# Patient Record
Sex: Female | Born: 1940 | Race: Black or African American | Hispanic: No | Smoking: Former smoker
Health system: Southern US, Community
[De-identification: ages and names within clinical notes are randomized; demographics above are authoritative.]

## PROBLEM LIST (undated history)

## (undated) DIAGNOSIS — M1990 Unspecified osteoarthritis, unspecified site: Secondary | ICD-10-CM

## (undated) DIAGNOSIS — K5090 Crohn's disease, unspecified, without complications: Secondary | ICD-10-CM

## (undated) DIAGNOSIS — N179 Acute kidney failure, unspecified: Secondary | ICD-10-CM

## (undated) DIAGNOSIS — I509 Heart failure, unspecified: Secondary | ICD-10-CM

## (undated) DIAGNOSIS — J449 Chronic obstructive pulmonary disease, unspecified: Secondary | ICD-10-CM

## (undated) DIAGNOSIS — G4730 Sleep apnea, unspecified: Secondary | ICD-10-CM

## (undated) DIAGNOSIS — E119 Type 2 diabetes mellitus without complications: Secondary | ICD-10-CM

## (undated) DIAGNOSIS — G8929 Other chronic pain: Secondary | ICD-10-CM

## (undated) DIAGNOSIS — M25559 Pain in unspecified hip: Secondary | ICD-10-CM

## (undated) DIAGNOSIS — I639 Cerebral infarction, unspecified: Secondary | ICD-10-CM

## (undated) DIAGNOSIS — N189 Chronic kidney disease, unspecified: Secondary | ICD-10-CM

## (undated) DIAGNOSIS — I10 Essential (primary) hypertension: Secondary | ICD-10-CM

## (undated) DIAGNOSIS — K219 Gastro-esophageal reflux disease without esophagitis: Secondary | ICD-10-CM

## (undated) DIAGNOSIS — K222 Esophageal obstruction: Secondary | ICD-10-CM

## (undated) DIAGNOSIS — K279 Peptic ulcer, site unspecified, unspecified as acute or chronic, without hemorrhage or perforation: Secondary | ICD-10-CM

## (undated) DIAGNOSIS — D649 Anemia, unspecified: Secondary | ICD-10-CM

## (undated) DIAGNOSIS — M549 Dorsalgia, unspecified: Secondary | ICD-10-CM

## (undated) DIAGNOSIS — R569 Unspecified convulsions: Secondary | ICD-10-CM

## (undated) HISTORY — DX: Cerebral infarction, unspecified: I63.9

## (undated) HISTORY — PX: CHOLECYSTECTOMY: SHX55

## (undated) HISTORY — PX: BLADDER SURGERY: SHX569

## (undated) HISTORY — PX: ABDOMINAL HYSTERECTOMY: SHX81

## (undated) HISTORY — PX: SKIN CANCER EXCISION: SHX779

## (undated) HISTORY — DX: Anemia, unspecified: D64.9

## (undated) HISTORY — PX: TONSILLECTOMY: SUR1361

## (undated) HISTORY — DX: Unspecified convulsions: R56.9

---

## 2000-01-11 ENCOUNTER — Encounter: Payer: Self-pay | Admitting: Internal Medicine

## 2000-01-11 ENCOUNTER — Encounter (INDEPENDENT_AMBULATORY_CARE_PROVIDER_SITE_OTHER): Payer: Self-pay | Admitting: *Deleted

## 2004-12-22 ENCOUNTER — Emergency Department (HOSPITAL_COMMUNITY): Admission: EM | Admit: 2004-12-22 | Discharge: 2004-12-23 | Payer: Self-pay | Admitting: Emergency Medicine

## 2005-02-17 ENCOUNTER — Emergency Department (HOSPITAL_COMMUNITY): Admission: EM | Admit: 2005-02-17 | Discharge: 2005-02-17 | Payer: Self-pay | Admitting: Emergency Medicine

## 2006-10-10 ENCOUNTER — Encounter: Admission: RE | Admit: 2006-10-10 | Discharge: 2006-10-10 | Payer: Self-pay | Admitting: Internal Medicine

## 2006-10-10 ENCOUNTER — Encounter (INDEPENDENT_AMBULATORY_CARE_PROVIDER_SITE_OTHER): Payer: Self-pay | Admitting: *Deleted

## 2006-10-17 ENCOUNTER — Emergency Department (HOSPITAL_COMMUNITY): Admission: EM | Admit: 2006-10-17 | Discharge: 2006-10-18 | Payer: Self-pay | Admitting: Emergency Medicine

## 2006-11-21 ENCOUNTER — Ambulatory Visit: Payer: Self-pay | Admitting: Internal Medicine

## 2006-12-08 ENCOUNTER — Ambulatory Visit (HOSPITAL_COMMUNITY): Admission: RE | Admit: 2006-12-08 | Discharge: 2006-12-08 | Payer: Self-pay | Admitting: Internal Medicine

## 2006-12-08 ENCOUNTER — Encounter: Payer: Self-pay | Admitting: Internal Medicine

## 2006-12-13 ENCOUNTER — Ambulatory Visit: Payer: Self-pay | Admitting: Internal Medicine

## 2007-02-15 ENCOUNTER — Emergency Department (HOSPITAL_COMMUNITY): Admission: EM | Admit: 2007-02-15 | Discharge: 2007-02-15 | Payer: Self-pay | Admitting: Emergency Medicine

## 2007-06-28 DIAGNOSIS — K222 Esophageal obstruction: Secondary | ICD-10-CM | POA: Insufficient documentation

## 2007-06-28 DIAGNOSIS — M130 Polyarthritis, unspecified: Secondary | ICD-10-CM | POA: Insufficient documentation

## 2007-06-28 DIAGNOSIS — K449 Diaphragmatic hernia without obstruction or gangrene: Secondary | ICD-10-CM | POA: Insufficient documentation

## 2007-06-28 DIAGNOSIS — Z8711 Personal history of peptic ulcer disease: Secondary | ICD-10-CM | POA: Insufficient documentation

## 2007-06-28 DIAGNOSIS — T7840XA Allergy, unspecified, initial encounter: Secondary | ICD-10-CM | POA: Insufficient documentation

## 2007-06-28 DIAGNOSIS — E785 Hyperlipidemia, unspecified: Secondary | ICD-10-CM

## 2007-06-28 DIAGNOSIS — E119 Type 2 diabetes mellitus without complications: Secondary | ICD-10-CM | POA: Insufficient documentation

## 2007-06-28 DIAGNOSIS — R030 Elevated blood-pressure reading, without diagnosis of hypertension: Secondary | ICD-10-CM | POA: Insufficient documentation

## 2007-06-28 DIAGNOSIS — G589 Mononeuropathy, unspecified: Secondary | ICD-10-CM | POA: Insufficient documentation

## 2007-06-28 DIAGNOSIS — K5090 Crohn's disease, unspecified, without complications: Secondary | ICD-10-CM

## 2007-06-28 DIAGNOSIS — I509 Heart failure, unspecified: Secondary | ICD-10-CM | POA: Insufficient documentation

## 2008-02-12 ENCOUNTER — Ambulatory Visit: Payer: Self-pay | Admitting: Endocrinology

## 2008-02-15 ENCOUNTER — Encounter: Payer: Self-pay | Admitting: Endocrinology

## 2008-04-07 ENCOUNTER — Ambulatory Visit: Payer: Self-pay | Admitting: Diagnostic Radiology

## 2008-04-07 ENCOUNTER — Emergency Department (HOSPITAL_BASED_OUTPATIENT_CLINIC_OR_DEPARTMENT_OTHER): Admission: EM | Admit: 2008-04-07 | Discharge: 2008-04-07 | Payer: Self-pay | Admitting: Emergency Medicine

## 2008-11-14 ENCOUNTER — Ambulatory Visit: Payer: Self-pay | Admitting: Pulmonary Disease

## 2008-11-14 DIAGNOSIS — G4733 Obstructive sleep apnea (adult) (pediatric): Secondary | ICD-10-CM | POA: Insufficient documentation

## 2008-11-14 DIAGNOSIS — R0602 Shortness of breath: Secondary | ICD-10-CM

## 2008-11-17 ENCOUNTER — Telehealth: Payer: Self-pay | Admitting: Endocrinology

## 2008-11-22 ENCOUNTER — Emergency Department (HOSPITAL_BASED_OUTPATIENT_CLINIC_OR_DEPARTMENT_OTHER): Admission: EM | Admit: 2008-11-22 | Discharge: 2008-11-22 | Payer: Self-pay | Admitting: Emergency Medicine

## 2008-11-22 ENCOUNTER — Ambulatory Visit: Payer: Self-pay | Admitting: Diagnostic Radiology

## 2008-12-08 ENCOUNTER — Ambulatory Visit: Payer: Self-pay | Admitting: Pulmonary Disease

## 2009-10-15 ENCOUNTER — Inpatient Hospital Stay (HOSPITAL_COMMUNITY): Admission: EM | Admit: 2009-10-15 | Discharge: 2009-10-16 | Payer: Self-pay | Admitting: Internal Medicine

## 2009-10-15 ENCOUNTER — Ambulatory Visit: Payer: Self-pay | Admitting: Gastroenterology

## 2009-10-15 ENCOUNTER — Encounter: Payer: Self-pay | Admitting: Emergency Medicine

## 2009-10-15 ENCOUNTER — Ambulatory Visit: Payer: Self-pay | Admitting: Diagnostic Radiology

## 2009-10-15 ENCOUNTER — Encounter (INDEPENDENT_AMBULATORY_CARE_PROVIDER_SITE_OTHER): Payer: Self-pay | Admitting: *Deleted

## 2009-11-09 ENCOUNTER — Ambulatory Visit: Payer: Self-pay | Admitting: Internal Medicine

## 2009-11-24 ENCOUNTER — Ambulatory Visit (HOSPITAL_COMMUNITY): Admission: RE | Admit: 2009-11-24 | Discharge: 2009-11-24 | Payer: Self-pay | Admitting: Internal Medicine

## 2009-11-24 ENCOUNTER — Ambulatory Visit: Payer: Self-pay | Admitting: Internal Medicine

## 2009-11-25 ENCOUNTER — Encounter: Payer: Self-pay | Admitting: Internal Medicine

## 2010-04-27 NOTE — Letter (Signed)
Summary: Patient Notice- Polyp Results  [facility]  [address] [place], [place] [address]   Phone: [phone]  Fax: [phone]        November 25, 2009 MRN: [id number]    [facility] [address] [place], [place]  [address]    Dear Ms. Hayse,  I am pleased to inform you that the colon polyp(s) removed during your recent colonoscopy was (were) found to be benign (no cancer detected) upon pathologic examination.The polyps were hyperplastic ( not malignant). The random biopsies of the ileum and colon show low grade Crohn's disease  I recommend you have a repeat colonoscopy examination in 5x_ years to look for recurrent polyps, as having colon polyps increases your risk for having recurrent polyps or even colon cancer in the future.  Should you develop new or worsening symptoms of abdominal pain, bowel habit changes or bleeding from the rectum or bowels, please schedule an evaluation with either your primary care physician or with me.  Additional information/recommendations:  __ No further action with gastroenterology is needed at this time. Please      follow-up with your primary care physician for your other healthcare      needs.  _x_ Please call [phone] to schedule a return visit to review your      situation.  __ Please keep your follow-up visit as already scheduled.  _x_ Continue treatment plan as outlined the day of your exam.  Please call us if you are having persistent problems or have questions about your condition that have not been fully answered at this time.  Sincerely,  Lafayette Dragon MD  This letter has been electronically signed by your physician.  Appended Document: Patient Notice- Polyp Results letter mailed to patient's home

## 2010-04-27 NOTE — Letter (Signed)
Summary: Diabetic Instructions  [facility]  [place], [place] [address]   Phone: [phone]  Fax: [phone]    Michelle Farrell [birth year] MRN: [id number]   _ x _   ORAL DIABETIC MEDICATION INSTRUCTIONS                                                                Januvia, Actos The day before your procedure:   Take your diabetic pill as you do normally  The day of your procedure:   Do not take your diabetic pill    We will check your blood sugar levels during the admission process and again in Recovery before discharging you home  ________________________________________________________________________  _x  _   INSULIN (LONG ACTING) MEDICATION INSTRUCTIONS (Lantus,)   The day before your procedure:   Take  your regular evening dose    The day of your procedure:   Do not take your morning dose

## 2010-04-27 NOTE — Assessment & Plan Note (Signed)
Summary: POST HOSPITAL//CROHN'S FOLLOW-UP           Michelle Farrell   History of Present Illness Visit Type: Initial Visit Primary GI MD: Delfin Edis MD Primary Provider: Jana Hakim, MD Chief Complaint: Patient states that since she was discharged from the hospital she is doing good. She has increase her Asacol to three tabs three times a day.  History of Present Illness:   This is a 70 year old African American female who was hospitalized for Crohn's disease from 10/16/09-10/17/09. A CT scan of the abdomen showed an inflammatory process in the terminal ileum and cecum as well as an abdominal wall hernia which appeared to be stable. She has a diagnosis of Crohn's disease which was made in the 1990s at [facility] in  [place]. Her last office visit with [place]  was in August 2008. Other medical problems include COPD, type 2 diabetes, smoking, chronic renal insufficiency, history of esophageal stricture status post dilatation in 2003 and peptic ulcer disease. Her last colonoscopy in October 2001 by Dr. Melina Copa was a difficult exam due to redundant colon. The terminal ileum was not intubated. An upper endoscopy for dysphagia  in September 2008 showed no definite stricture. She was dilated with 15,16 and 17 mm dilators.   GI Review of Systems      Denies abdominal pain, acid reflux, belching, bloating, chest pain, dysphagia with liquids, dysphagia with solids, heartburn, loss of appetite, nausea, vomiting, vomiting blood, weight loss, and  weight gain.        Denies anal fissure, black tarry stools, change in bowel habit, constipation, diarrhea, diverticulosis, fecal incontinence, heme positive stool, hemorrhoids, irritable bowel syndrome, jaundice, light color stool, liver problems, rectal bleeding, and  rectal pain.    Current Medications (verified): 1)  Ventolin Hfa 108 (90 Base) Mcg/act Aers (Albuterol Sulfate) .... Use Prn 2)  Advair Diskus 250-50 Mcg/dose Misc (Fluticasone-Salmeterol) .... Use Prn 3)   Lantus 100 Unit/ml Soln (Insulin Glargine) .... 80 Units At Bedtime 4)  Tizanidine Hcl 4 Mg Tabs (Tizanidine Hcl) .... Take One By Mouth Three Times A Day 5)  Asacol 400 Mg Tbec (Mesalamine) .... Take 3 Tabs By Mouth Three Times A Day 6)  Gabapentin 600 Mg Tabs (Gabapentin) .... Talke 1 By Mouth Three Times A Day 7)  Omeprazole 20 Mg Cpdr (Omeprazole) .... Take 1 By Mouth Two Times A Day 8)  Simvastatin 40 Mg Tabs (Simvastatin) .... Take 1 At Bedtime 9)  Metoclopramide Hcl 10 Mg Tabs (Metoclopramide Hcl) .... Take 1 Four Times A Day 10)  Hydrocodone-Acetaminophen 5-500 Mg Tabs (Hydrocodone-Acetaminophen) .... Take 1 Three Times A Day 11)  Actos 30 Mg Tabs (Pioglitazone Hcl) .... Take 1 By Mouth Am 12)  Temazepam 15 Mg Caps (Temazepam) .... Take 1 At Bedtime 13)  Ranitidine Hcl 150 Mg Tabs (Ranitidine Hcl) .... Take 1 By Mouth Two Times A Day 14)  Dicyclomine Hcl 10 Mg Caps (Dicyclomine Hcl) .... Take One By Mouth Four Times A Day 15)  Klor-Con M20 20 Meq Cr-Tabs (Potassium Chloride Crys Cr) .... Take 1 By Mouth Qd 16)  Fish Oil 1000 Mg Caps (Omega-3 Fatty Acids) .... Take 1 By Mouth Qd 17)  Januvia 100 Mg Tabs (Sitagliptin Phosphate) .... Take 1 Tablet By Mouth Once A Day 18)  Vitamin C .... Take 1 Tablet By Mouth Once A Day 19)  Vitamin E .... Take 1 Tablet By Mouth Once A Day 20)  Prednisone 10 Mg Tabs (Prednisone) .... Take As Directed  Allergies (  verified): No Known Drug Allergies  Past History:  Past Medical History: Reviewed history from 11/14/2008 and no changes required.  HIATAL HERNIA (ICD-553.3) ESOPHAGEAL STRICTURE (ICD-530.3) OBSTRUCTIVE SLEEP APNEA (ICD-327.23) ALLERGY (ICD-995.3) POLYARTHRITIS (ICD-716.59) CHF (ICD-428.0) ELEVATED BLOOD PRESSURE (ICD-796.2) HYPERLIPIDEMIA (ICD-272.4) NEUROPATHY (ICD-355.9) DM (ICD-250.00) PUD, HX OF (ICD-V12.71) CROHN'S DISEASE (ICD-555.9)    Past Surgical History: Reviewed history from 06/28/2007 and no changes  required. Cholecystectomy Ventral hernia repair Hysterectomy  Family History: mother and 2 sibs have dm allergies: sister asthma: sister heart disease: mother, maternal grandfather, paternal grandmother cancer: maternal grandmother (ovarian, stomach)  Family History of Cirrhosis: Father Family History of Clotting disorder: Father esophageal varices Family History of Colon Cancer: maternal grandmother Family History of Esophageal Cancer: maternal uncle Family History of Kidney Disease: mother  Social History: Reviewed history from 11/03/2009 and no changes required. widowed pt has children. retired.  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 Patient is a current smoker. started at age 83.  1/2 ppd. Alcohol Use - no Illicit Drug Use - no  Review of Systems       The patient complains of arthritis/joint pain, change in vision, fatigue, heart rhythm changes, muscle pains/cramps, shortness of breath, sleeping problems, urination - excessive, and urination changes/pain.  The patient denies allergy/sinus, anemia, anxiety-new, back pain, blood in urine, breast changes/lumps, confusion, cough, coughing up blood, depression-new, fainting, fever, headaches-new, hearing problems, heart murmur, itching, menstrual pain, night sweats, nosebleeds, pregnancy symptoms, skin rash, sore throat, swelling of feet/legs, swollen lymph glands, thirst - excessive , urination - excessive , urine leakage, vision changes, and voice change.         Pertinent positive and negative review of systems were noted in the above HPI. All other ROS was otherwise negative.   Vital Signs:  Patient profile:   70 year old female Height:      67 inches Weight:      219.8 pounds BMI:     34.55 Pulse rate:   64 / minute Pulse rhythm:   regular BP sitting:   146 / 72  (left arm) Cuff size:   regular  Vitals Entered By: Bernita Buffy CMA Deborra Medina) (November 09, 2009 2:33 PM)  Physical Exam  General:  Well  developed, well nourished, no acute distress. Eyes:  PERRLA, no icterus. Mouth:  No deformity or lesions, dentition normal. Neck:  Supple; no masses or thyromegaly. Lungs:  Clear throughout to auscultation. Heart:  Regular rate and rhythm; no murmurs, rubs,  or bruits. Abdomen:  large protuberant abdomen with large panniculus. Mild tenderness on deep pressure in the right lower quadrant. Normal active bowel sounds. No mass. Rectal:  normal perianal area, normal rectal sphincter tone. Stool is Hemoccult negative. Skin:  Intact without significant lesions or rashes. Psych:  Alert and cooperative. Normal mood and affect.   Impression & Recommendations:  Problem # 1:  CROHN'S DISEASE (ICD-555.9) Patient has Crohn's disease of the terminal ileum and cecum as per recent CT scan of the abdomen. Patient is symptomatically improved on a prednisone taper from 20 mg to a current 5 mg. She will be finished later this week. She continues on Asacol 3.6 g daily. We will schedule her for a colonoscopy to assess the cecum, ileocecal valve and hopefully to intubate the terminal ileum to see if she is a candidate for an immunoooomodulator.  Orders: ZCOL (ZCOL)  Problem # 2:  ESOPHAGEAL STRICTURE (ICD-530.3) Patient has no symptoms at this time. She is to continue on omeprazole  20 mg 2 times a day.  Patient Instructions: 1)  continue Asacol 3.6 g daily. 2)  Schedule colonoscopy with MiraLax prep. 3)  Consider 6 MP 50 mg daily depending on the results of the colonoscopy. 4)  Copy sent to : Dr Jana Hakim 5)  The medication list was reviewed and reconciled.  All changed / newly prescribed medications were explained.  A complete medication list was provided to the patient / caregiver. Prescriptions: DULCOLAX 5 MG  TBEC (BISACODYL) Day before procedure take 2 at 3pm and 2 at 8pm.  #4 x 0   Entered by:   Madlyn Frankel CMA (AAMA)   Authorized by:   Lafayette Dragon MD   Signed by:   Madlyn Frankel CMA ([place]) on 11/09/2009   Method used:   Electronically to        [facility] [phone]* (retail)       [address]       [place], [place]  [address]       Ph: [phone]       Fax: [phone]   RxID:   [id number] REGLAN 10 MG  TABS (METOCLOPRAMIDE HCL) As per prep instructions.  #2 x 0   Entered by:   Madlyn Frankel CMA (AAMA)   Authorized by:   Lafayette Dragon MD   Signed by:   [place] (AAMA) on 11/09/2009   Method used:   Electronically to        [facility] [phone]* (retail)       [address]       [place], [place]  [address]       Ph: [phone]       Fax: [phone]   RxID:   [id number] [place]   POWD (POLYETHYLENE GLYCOL 3350) As per prep  instructions.  #255gm x 0   Entered by:   Madlyn Frankel CMA (AAMA)   Authorized by:   Lafayette Dragon MD   Signed by:   Madlyn Frankel CMA ([place]) on 11/09/2009   Method used:   Electronically to        [facility] [phone]* (retail)       [address]       [place], [place]  [address]       Ph: [phone]       Fax: [phone]   RxID:   [id number]

## 2010-04-27 NOTE — Letter (Signed)
Summary: COLON -Dr Melina Copa  COLON -Dr Melina Copa   Imported By: Madlyn Frankel CMA ([place]) 11/05/2009 11:53:47  _____________________________________________________________________  External Attachment:    Type:   Image     Comment:   External Document

## 2010-04-27 NOTE — Procedures (Signed)
Summary: Colonoscopy  Patient: Michelle Farrell Note: All result statuses are Final unless otherwise noted.  Tests: (1) Colonoscopy (COL)   COL Colonoscopy           [facility]     [place], [place]  [address]           COLONOSCOPY PROCEDURE REPORT           PATIENT:  [name], [name]  MR#:  [id number]     BIRTHDATE:  20-Jun-1940, 75 yrs. old  GENDER:  female     ENDOSCOPIST:  [name]. Olevia Perches, MD     REF. BY:  Jana Hakim, M.D.     PROCEDURE DATE:  11/24/2009     PROCEDURE:  Colonoscopy [address]23[address]     ASA CLASS:  Class II     INDICATIONS:  Crohn's disease since 1990, recent hospitalization     for flare-up, CT scan showed inflam changes in the cecum and TI,     pt now doing well, has completed Prednisone taper     MEDICATIONS:   Versed 10 mg, Fentanyl 100 mcg           DESCRIPTION OF PROCEDURE:   After the risks benefits and     alternatives of the procedure were thoroughly explained, informed     consent was obtained.  Digital rectal exam was performed and     revealed no rectal masses.   The  endoscope was introduced through     the anus and advanced to the terminal ileum which was intubated     for a short distance, without limitations.  The quality of the     prep was excellent, using MiraLax.  The instrument was then slowly     withdrawn as the colon was fully examined.     <<PROCEDUREIMAGES>>           FINDINGS:  There were multiple polyps identified and removed. in     the rectum. 5-6 tiny polyps in the rectum The polyps were removed     using cold biopsy forceps (see image1 and image2).  There were     multiple polyps identified and removed. throughout the colon. ?     pseudopolyps ?     The polyps were removed using cold biopsy forceps (see image3,     image4, and image5).  An ulcer was found in the cecum. With     standard forceps, biopsy was obtained and sent to pathology (see     image8, image7, and image6). linear ulceration, ?  Crohn's  The     terminal ileum appeared normal. With standard forceps, biopsy was     obtained and sent to pathology (see image10, image9, image11, and     image12).  Mild diverticulosis was found in the ascending colon     (see image13).  normal rectum (see image14).   Retroflexed views     in the rectum revealed no abnormalities.    The scope was then     withdrawn from the patient and the procedure completed.           COMPLICATIONS:  None     ENDOSCOPIC IMPRESSION:     1) Polyps, multiple in the rectum     2) Polyps, multiple throughout the colon     3) Ulcer in the cecum     4) Normal terminal ileum     5) Mild  diverticulosis in the ascending colon     6) Normal rectum     multiple small polyps and pseudopolyps, no evidence of Crohn's     disease in the TI, the only activiti noted in the cecum at the     site of a linear ulceration, ileocecal valve appears normal ( not     deformed)     RECOMMENDATIONS:     1) Await biopsy results     Asacal 9/day, would hold off on starting Imuran since disease is     minimal.     Follow up OV 2 months     REPEAT EXAM:  In 5 year(s) for.           ______________________________     [name]. Olevia Perches, MD           CC:           n.     eSIGNED:   [name]. Brodie at 11/24/2009 10:50 AM           [name], [phone]  Note: An exclamation mark (!) indicates a result that was not dispersed into the flowsheet. Document Creation Date: 11/24/2009 10:52 AM _______________________________________________________________________  (1) Order result status: Final Collection or observation date-time: 11/24/2009 10:39 Requested date-time:  Receipt date-time:  Reported date-time:  Referring Physician:   Ordering Physician: Delfin Edis [phone]) Specimen Source:  Source: Tawanna Cooler Order Number: [id number] Lab site:

## 2010-04-27 NOTE — Procedures (Signed)
Summary: Instructions for procedure/[place]  Instructions for procedure/[place]   Imported By: Phillis Knack 11/20/2009 11:04:28  _____________________________________________________________________  External Attachment:    Type:   Image     Comment:   External Document

## 2010-04-27 NOTE — Letter (Signed)
Summary: [facility] Instructions  [facility]  [place], [place] [address]   Phone: [phone]  Fax: [phone]       Michelle Farrell    1941-01-20    MRN: [id number]       Procedure Day /Date: 11/24/09 Tuesday     Arrival Time: 8:30 am     Procedure Time: 9:30 am     Location of Procedure:                    _ x_  [facility] ( Outpatient Registration)   [place]  Starting 5 days prior to your procedure (11/19/09) do not eat nuts, seeds, popcorn, corn, beans, peas,  salads, or any raw vegetables.  Do not take any fiber supplements (e.g. Metamucil, Citrucel, and Benefiber). ____________________________________________________________________________________________________   THE DAY BEFORE YOUR PROCEDURE         DATE: 11/23/09 DAY: Monday  1   Drink clear liquids the entire day-NO SOLID FOOD  2   Do not drink anything colored red or purple.  Avoid juices with pulp.  No orange juice.  3   Drink at least 64 oz. (8 glasses) of fluid/clear liquids during the day to prevent dehydration and help the prep work efficiently.  CLEAR LIQUIDS INCLUDE: Water Jello Ice Popsicles Tea (sugar ok, no milk/cream) Powdered fruit flavored drinks Coffee (sugar ok, no milk/cream) Gatorade Juice: apple, white grape, white cranberry  Lemonade Clear bullion, consomm, broth Carbonated beverages (any kind) Strained chicken noodle soup Hard Candy  4   Mix the entire bottle of Miralax with 64 oz. of Gatorade/Powerade in the morning and put in the refrigerator to chill.  5   At 3:00 pm take 2 Dulcolax/Bisacodyl tablets.  6   At 4:30 pm take one Reglan/Metoclopramide tablet.  7  Starting at 5:00 pm drink one 8 oz glass of the Miralax mixture every 15-20 minutes until you have finished drinking the entire 64 oz.  You should finish drinking prep around 7:30 or 8:00 pm.  8   If you are nauseated, you may take the 2nd  Reglan/Metoclopramide tablet at 6:30 pm.        9    At 8:00 pm take 2 more DULCOLAX/Bisacodyl tablets.        THE DAY OF YOUR PROCEDURE      DATE:  11/24/09 DAY: Wednesday  You may drink clear liquids until 5:30 am  (4 HOURS BEFORE PROCEDURE).   MEDICATION INSTRUCTIONS  Unless otherwise instructed, you should take regular prescription medications with a small sip of water as early as possible the morning of your procedure.  Diabetic patients - see separate instructions.       OTHER INSTRUCTIONS  You will need a responsible adult at least 68 years of age to accompany you and drive you home.   This person must remain in the waiting room during your procedure.  Wear loose fitting clothing that is easily removed.  Leave jewelry and other valuables at home.  However, you may wish to bring a book to read or an iPod/MP3 player to listen to music as you wait for your procedure to start.  Remove all body piercing jewelry and leave at home.  Total time from sign-in until discharge is approximately 2-3 hours.  You should go home directly after your procedure and rest.  You can resume normal activities the day after your procedure.  The day of your procedure you  should not:   Drive   Make legal decisions   Operate machinery   Drink alcohol   Return to work  You will receive specific instructions about eating, activities and medications before you leave.   The above instructions have been reviewed and explained to me by   _______________________    I fully understand and can verbalize these instructions _____________________________ Date _______

## 2010-06-11 LAB — GLUCOSE, CAPILLARY: Glucose-Capillary: 74 mg/dL (ref 70–99)

## 2010-06-12 LAB — CULTURE, BLOOD (ROUTINE X 2)
Culture: NO GROWTH
Culture: NO GROWTH

## 2010-06-12 LAB — DIFFERENTIAL
Basophils Absolute: 0.1 10*3/uL (ref 0.0–0.1)
Basophils Relative: 1 % (ref 0–1)
Eosinophils Absolute: 0.2 10*3/uL (ref 0.0–0.7)
Eosinophils Relative: 2 % (ref 0–5)
Lymphocytes Relative: 33 % (ref 12–46)
Lymphs Abs: 4.7 10*3/uL — ABNORMAL HIGH (ref 0.7–4.0)
Monocytes Absolute: 1.4 10*3/uL — ABNORMAL HIGH (ref 0.1–1.0)
Monocytes Relative: 10 % (ref 3–12)
Neutro Abs: 7.8 10*3/uL — ABNORMAL HIGH (ref 1.7–7.7)
Neutrophils Relative %: 55 % (ref 43–77)

## 2010-06-12 LAB — URINALYSIS, ROUTINE W REFLEX MICROSCOPIC
Bilirubin Urine: NEGATIVE
Glucose, UA: NEGATIVE mg/dL
Hgb urine dipstick: NEGATIVE
Ketones, ur: NEGATIVE mg/dL
Nitrite: NEGATIVE
Protein, ur: NEGATIVE mg/dL
Specific Gravity, Urine: 1.013 (ref 1.005–1.030)
Urobilinogen, UA: 1 mg/dL (ref 0.0–1.0)
pH: 5.5 (ref 5.0–8.0)

## 2010-06-12 LAB — COMPREHENSIVE METABOLIC PANEL
ALT: 9 U/L (ref 0–35)
ALT: 13 U/L (ref 0–35)
AST: 17 U/L (ref 0–37)
AST: 18 U/L (ref 0–37)
Albumin: 3.7 g/dL (ref 3.5–5.2)
Albumin: 3.5 g/dL (ref 3.5–5.2)
Alkaline Phosphatase: 91 U/L (ref 39–117)
BUN: 27 mg/dL — ABNORMAL HIGH (ref 6–23)
CO2: 20 mEq/L (ref 19–32)
CO2: 20 mEq/L (ref 19–32)
Calcium: 9.6 mg/dL (ref 8.4–10.5)
Calcium: 9.4 mg/dL (ref 8.4–10.5)
Chloride: 107 mEq/L (ref 96–112)
Creatinine, Ser: 3 mg/dL — ABNORMAL HIGH (ref 0.4–1.2)
Creatinine, Ser: 1.98 mg/dL — ABNORMAL HIGH (ref 0.4–1.2)
GFR calc Af Amer: 19 mL/min — ABNORMAL LOW (ref >60)
GFR calc Af Amer: 30 mL/min — ABNORMAL LOW (ref >60)
GFR calc non Af Amer: 16 mL/min — ABNORMAL LOW (ref >60)
GFR calc non Af Amer: 25 mL/min — ABNORMAL LOW (ref >60)
Glucose, Bld: 105 mg/dL — ABNORMAL HIGH (ref 70–99)
Potassium: 4.3 mEq/L (ref 3.5–5.1)
Sodium: 140 mEq/L (ref 135–145)
Sodium: 135 mEq/L (ref 135–145)
Total Bilirubin: 0.5 mg/dL (ref 0.3–1.2)
Total Protein: 6.9 g/dL (ref 6.0–8.3)
Total Protein: 7.2 g/dL (ref 6.0–8.3)

## 2010-06-12 LAB — IRON AND TIBC
Saturation Ratios: 8 % — ABNORMAL LOW (ref 20–55)
UIBC: 260 ug/dL

## 2010-06-12 LAB — CBC
HCT: 31.6 % — ABNORMAL LOW (ref 36.0–46.0)
Hemoglobin: 10.7 g/dL — ABNORMAL LOW (ref 12.0–15.0)
Hemoglobin: 11.3 g/dL — ABNORMAL LOW (ref 12.0–15.0)
MCH: 30.6 pg (ref 26.0–34.0)
MCH: 30.1 pg (ref 26.0–34.0)
MCHC: 33.9 g/dL (ref 30.0–36.0)
MCHC: 33.1 g/dL (ref 30.0–36.0)
MCV: 90.2 fL (ref 78.0–100.0)
Platelets: 169 10*3/uL (ref 150–400)
Platelets: 161 10*3/uL (ref 150–400)
RBC: 3.51 MIL/uL — ABNORMAL LOW (ref 3.87–5.11)
RDW: 14.9 % (ref 11.5–15.5)
WBC: 14.2 10*3/uL — ABNORMAL HIGH (ref 4.0–10.5)

## 2010-06-12 LAB — BASIC METABOLIC PANEL
CO2: 21 mEq/L (ref 19–32)
Calcium: 9.7 mg/dL (ref 8.4–10.5)
GFR calc Af Amer: 41 mL/min — ABNORMAL LOW (ref >60)
Glucose, Bld: 137 mg/dL — ABNORMAL HIGH (ref 70–99)
Potassium: 3.9 mEq/L (ref 3.5–5.1)
Sodium: 143 mEq/L (ref 135–145)

## 2010-06-12 LAB — GLUCOSE, CAPILLARY
Glucose-Capillary: 104 mg/dL — ABNORMAL HIGH (ref 70–99)
Glucose-Capillary: 100 mg/dL — ABNORMAL HIGH (ref 70–99)
Glucose-Capillary: 138 mg/dL — ABNORMAL HIGH (ref 70–99)
Glucose-Capillary: 179 mg/dL — ABNORMAL HIGH (ref 70–99)
Glucose-Capillary: 221 mg/dL — ABNORMAL HIGH (ref 70–99)
Glucose-Capillary: 242 mg/dL — ABNORMAL HIGH (ref 70–99)

## 2010-06-12 LAB — FERRITIN: Ferritin: 81 ng/mL (ref 10–291)

## 2010-06-12 LAB — URINE MICROSCOPIC-ADD ON

## 2010-06-12 LAB — RETICULOCYTES
RBC.: 3.88 MIL/uL (ref 3.87–5.11)
Retic Ct Pct: 1.6 % (ref 0.4–3.1)

## 2010-06-12 LAB — URINE CULTURE: Colony Count: 8000

## 2010-06-12 LAB — FOLATE: Folate: 11.4 ng/mL

## 2010-06-12 LAB — MRSA PCR SCREENING: MRSA by PCR: NEGATIVE

## 2010-06-12 LAB — VITAMIN B12: Vitamin B-12: 374 pg/mL (ref 211–911)

## 2010-06-12 LAB — CARDIAC PANEL(CRET KIN+CKTOT+MB+TROPI)
CK, MB: 5.1 ng/mL — ABNORMAL HIGH (ref 0.3–4.0)
Relative Index: 2.8 — ABNORMAL HIGH (ref 0.0–2.5)
Total CK: 184 U/L — ABNORMAL HIGH (ref 7–177)

## 2010-06-12 LAB — CREATININE, URINE, RANDOM: Creatinine, Urine: 20.2 mg/dL

## 2010-06-12 LAB — CORTISOL: Cortisol, Plasma: 34.2 ug/dL

## 2010-06-12 LAB — LACTIC ACID, PLASMA: Lactic Acid, Venous: 1.5 mmol/L (ref 0.5–2.2)

## 2010-06-12 LAB — SODIUM, URINE, RANDOM: Sodium, Ur: 16 mEq/L

## 2010-07-12 LAB — URINALYSIS, ROUTINE W REFLEX MICROSCOPIC
Bilirubin Urine: NEGATIVE
Glucose, UA: NEGATIVE mg/dL
Hgb urine dipstick: NEGATIVE
Ketones, ur: NEGATIVE mg/dL
Protein, ur: 30 mg/dL — AB

## 2010-07-12 LAB — COMPREHENSIVE METABOLIC PANEL
ALT: 17 U/L (ref 0–35)
AST: 26 U/L (ref 0–37)
Alkaline Phosphatase: 110 U/L (ref 39–117)
Calcium: 9.8 mg/dL (ref 8.4–10.5)
GFR calc Af Amer: 32 mL/min — ABNORMAL LOW (ref >60)
Potassium: 4.4 mEq/L (ref 3.5–5.1)
Sodium: 137 mEq/L (ref 135–145)
Total Protein: 7.7 g/dL (ref 6.0–8.3)

## 2010-07-12 LAB — POCT CARDIAC MARKERS
CKMB, poc: 1.7 ng/mL (ref 1.0–8.0)
Myoglobin, poc: 191 ng/mL (ref 12–200)
Troponin i, poc: <0.05 ng/mL (ref 0.00–0.09)

## 2010-07-12 LAB — CBC
MCHC: 33.5 g/dL (ref 30.0–36.0)
RBC: 3.76 MIL/uL — ABNORMAL LOW (ref 3.87–5.11)
RDW: 14.7 % (ref 11.5–15.5)

## 2010-07-12 LAB — DIFFERENTIAL
Eosinophils Absolute: 0.9 10*3/uL — ABNORMAL HIGH (ref 0.0–0.7)
Eosinophils Relative: 12 % — ABNORMAL HIGH (ref 0–5)
Lymphs Abs: 3.2 10*3/uL (ref 0.7–4.0)
Monocytes Absolute: 1.1 10*3/uL — ABNORMAL HIGH (ref 0.1–1.0)
Monocytes Relative: 14 % — ABNORMAL HIGH (ref 3–12)

## 2010-07-12 LAB — URINE MICROSCOPIC-ADD ON

## 2010-07-12 LAB — POCT B-TYPE NATRIURETIC PEPTIDE (BNP): B Natriuretic Peptide, POC: 11.6 pg/mL (ref 0–100)

## 2010-08-10 NOTE — Assessment & Plan Note (Signed)
[name] OFFICE NOTE   Michelle Farrell, FEBO                       MRN:          [id number]  DATE:11/21/2006                            DOB:          January 27, 1941    REASON FOR CONSULTATION:  [name] is a very nice 70 year old  African/American female, a patient of Dr. Jana Hakim' who is here  today for an evaluation of several GI issues.  One is solid food  dysphagia.  For the past several months she has noticed food, especially  meat, sticking and hesitating.  There is no odynophagia.  She denies any  dysphagia to liquids.  There is a history of what sounds like benign  distal esophageal stricture which was dilated on two previous occasions  last time in [place] by Dr. Claudie Leach about five years ago.  She has been on  an anti-reflux regimen, initially Nexium 40 mg daily.  The Nexium was  discontinued because of no reimbursement caused by Medicaid and she was  switched to Prilosec 20 mg daily.  She has to take at least two a day  plus ranitidine, but still has reflux symptoms.  She denies any  nocturnal cough, hoarseness or choking.   Another problem has been diagnosed of Crohn's disease.  This was made in  [place] in the 1990's.  We do not have any of the records, but it  sounds like the patient had an upper GI, a CT scan, a colonoscopy and  multiple other studies.  She was put on prednisone .  She at that time  presented with diarrhea and abdominal pain.  There was never any  bleeding.  She currently denies any abdominal pain pertaining to her  Crohn's disease, but has had incontinent diarrhea at times.  Her weight  has been fluctuating.  She lost about 20 pounds several months ago, but  has regained it back.  Her last colonoscopy was done again by Dr. Claudie Leach  in  [place] about five years ago.  We have requested those records.   The third problem has been a history of peptic ulcer disease.  It  relates to her taking NSAIDs for her poly-arthritis many years ago.  She  developed an ulcer, seen on endoscopic examination.  She since then has  not taken NSAIDs.  She has arthralgias and receives steroid injections  in her joints.  She controls the arthralgias with hydrocodone 5/500 mg.   MEDICATIONS:  1. Potassium 20 mEq daily.  2. Dicyclomine 10 mg p.o. four times daily.  3. Cozaar 50 mg p.o. daily.  4. Asacol 400 mg, three p.o. twice daily.  5. Metoclopramide 10 mg four times daily.  6. Tizanidine hydrochloride 4 mg p.o. three times daily.  7. Spironolactone 25 mg p.o. daily.  8. __________  180 mg p.o. daily.  9. Omeprazole 200 mg p.o. twice daily.  10.Ranitidine 150 mg p.o. twice daily.  11.Temazepam 15 mg q.h.s.  12.Simvastatin 40 mg p.o. daily.  13.Furosemide 20 mg p.o. daily.  14.Gabapentin 600 mg p.o. three times daily.  15.Lantus insulin 75 units daily.  16.Fish  oil and B12 supplements.   PAST MEDICAL HISTORY:  1. Significant for diabetes, insulin-dependent.  2. Neuropathy.  3. Hyperlipidemia.  4. High blood pressure.  5. Heart failure.  Dr. Eden Lathe. Einar Gip is her cardiologist.  6. Poly-arthritis.  7. Allergies.  8. Sleep apnea.   PAST SURGICAL HISTORY:  1. Cholecystectomy.  2. Ventral hernia repair.  3. Hysterectomy.   FAMILY HISTORY:  Positive for alcoholism and diabetes.   SOCIAL HISTORY:  She lives with her granddaughter.  She is widowed.  She  has one child.  She smokes.  Does not drink alcohol.   REVIEW OF SYSTEMS:  Positive for eye glasses, allergies, urination  changes, arthritic complaints,  sleeping problems, excessive thirst,  vision changes, back pain, excessive urination, severe fatigue, hearing  problems, shortness of breath and  muscle pains.   PHYSICAL EXAMINATION:  VITAL SIGNS:  Blood pressure 100/60, pulse 68,  weight 181 pounds.  GENERAL:  She is alert and oriented, in no distress.  Her voice was  normal.  HEENT:  Sclerae anicteric.   NECK:  Supple, no adenopathy.  LUNGS:  Clear to auscultation.  COR:  Quiet precordium.  Normal S1 and S2.  ABDOMEN:  Soft, nontender, with a post-cholecystectomy scar.  Right  lower quadrant was unremarkable.  There was no tenderness.  Bowel sounds  normoactive.  No distention.  The liver edge was at the subcostal  margin.  RECTAL:  Decreased rectal tone.  Stool was soft, Hemoccult negative.  EXTREMITIES:  No edema.   IMPRESSION:  46. A 70 year old African/American female with solid food dysphagia,      suggestive of recurrent  benign distal esophageal stricture.  Rule      out Barrett's esophagus.  History of previous dilatations, the last      one five years ago.  She has a chronic gastroesophageal reflux      disease.  2. Epigastric pain, relieved by dicyclomine.  Rule out gastropathy and      peptic ulcer disease.  The patient has a prior history of non-      steroidal anti-inflammatory drug-related gastric ulcer.  3. History of Crohn's disease.  We do not have the records to document      it, but she had an extensive workup in __________  [facility] and subsequently has been followed by Dr. Claudie Leach in      [place].  The records are pending.  It appears that her Crohn's      disease is stable at this time.  4. Decreased rectal sphincter tone.  I did not fine any sign of      Crohn's disease involvement of her rectum.  This could be a      diabetic neuropathy causing fecal incontinence.   PLAN:  1. An upper endoscopy with dilatation, appropriate biopsies and CLO      test.  2. We will try to switch her to Nexium 40 mg daily, but she will need      a prior authorization.  3. Pending the review of all of her records, she may need further      management of her Crohn's disease, depending upon the activity and      the site of involvement.     [name]. Olevia Perches, MD  Electronically Signed    DMB/MedQ  DD: 11/21/2006  DT: 11/21/2006  Job #: [id number]   cc:   Jana Hakim, M.D.

## 2010-08-25 ENCOUNTER — Ambulatory Visit (HOSPITAL_COMMUNITY)
Admission: RE | Admit: 2010-08-25 | Discharge: 2010-08-25 | Disposition: A | Discharge status: Home / Self Care | Payer: Medicare Other | Source: Ambulatory Visit | Attending: Internal Medicine | Admitting: Internal Medicine

## 2010-08-25 ENCOUNTER — Other Ambulatory Visit (HOSPITAL_COMMUNITY): Payer: Self-pay | Admitting: Internal Medicine

## 2010-08-25 DIAGNOSIS — R102 Pelvic and perineal pain: Secondary | ICD-10-CM

## 2010-08-25 DIAGNOSIS — M79673 Pain in unspecified foot: Secondary | ICD-10-CM

## 2010-08-25 DIAGNOSIS — M79671 Pain in right foot: Secondary | ICD-10-CM

## 2010-08-25 DIAGNOSIS — M7730 Calcaneal spur, unspecified foot: Secondary | ICD-10-CM | POA: Insufficient documentation

## 2010-08-25 DIAGNOSIS — M25559 Pain in unspecified hip: Secondary | ICD-10-CM

## 2010-08-25 DIAGNOSIS — M79609 Pain in unspecified limb: Secondary | ICD-10-CM | POA: Insufficient documentation

## 2011-01-07 LAB — CLOTEST (H. PYLORI), BIOPSY: Helicobacter screen: NEGATIVE — AB

## 2013-07-14 ENCOUNTER — Emergency Department (HOSPITAL_BASED_OUTPATIENT_CLINIC_OR_DEPARTMENT_OTHER): Payer: Medicare Other

## 2013-07-14 ENCOUNTER — Encounter (HOSPITAL_BASED_OUTPATIENT_CLINIC_OR_DEPARTMENT_OTHER): Payer: Self-pay | Admitting: Emergency Medicine

## 2013-07-14 ENCOUNTER — Emergency Department (HOSPITAL_BASED_OUTPATIENT_CLINIC_OR_DEPARTMENT_OTHER)
Admission: EM | Admit: 2013-07-14 | Discharge: 2013-07-14 | Disposition: A | Discharge status: Home / Self Care | Payer: Medicare Other | Attending: Emergency Medicine | Admitting: Emergency Medicine

## 2013-07-14 DIAGNOSIS — F17200 Nicotine dependence, unspecified, uncomplicated: Secondary | ICD-10-CM | POA: Insufficient documentation

## 2013-07-14 DIAGNOSIS — E119 Type 2 diabetes mellitus without complications: Secondary | ICD-10-CM | POA: Insufficient documentation

## 2013-07-14 DIAGNOSIS — K219 Gastro-esophageal reflux disease without esophagitis: Secondary | ICD-10-CM | POA: Insufficient documentation

## 2013-07-14 DIAGNOSIS — S8991XA Unspecified injury of right lower leg, initial encounter: Secondary | ICD-10-CM

## 2013-07-14 DIAGNOSIS — S99929A Unspecified injury of unspecified foot, initial encounter: Principal | ICD-10-CM

## 2013-07-14 DIAGNOSIS — I509 Heart failure, unspecified: Secondary | ICD-10-CM | POA: Insufficient documentation

## 2013-07-14 DIAGNOSIS — Z79899 Other long term (current) drug therapy: Secondary | ICD-10-CM | POA: Insufficient documentation

## 2013-07-14 DIAGNOSIS — S99919A Unspecified injury of unspecified ankle, initial encounter: Principal | ICD-10-CM

## 2013-07-14 DIAGNOSIS — Z794 Long term (current) use of insulin: Secondary | ICD-10-CM | POA: Insufficient documentation

## 2013-07-14 DIAGNOSIS — K5090 Crohn's disease, unspecified, without complications: Secondary | ICD-10-CM | POA: Insufficient documentation

## 2013-07-14 DIAGNOSIS — Y92009 Unspecified place in unspecified non-institutional (private) residence as the place of occurrence of the external cause: Secondary | ICD-10-CM | POA: Insufficient documentation

## 2013-07-14 DIAGNOSIS — Y9301 Activity, walking, marching and hiking: Secondary | ICD-10-CM | POA: Insufficient documentation

## 2013-07-14 DIAGNOSIS — I10 Essential (primary) hypertension: Secondary | ICD-10-CM | POA: Insufficient documentation

## 2013-07-14 DIAGNOSIS — S8990XA Unspecified injury of unspecified lower leg, initial encounter: Secondary | ICD-10-CM | POA: Insufficient documentation

## 2013-07-14 DIAGNOSIS — G8929 Other chronic pain: Secondary | ICD-10-CM | POA: Insufficient documentation

## 2013-07-14 DIAGNOSIS — X500XXA Overexertion from strenuous movement or load, initial encounter: Secondary | ICD-10-CM | POA: Insufficient documentation

## 2013-07-14 DIAGNOSIS — R296 Repeated falls: Secondary | ICD-10-CM | POA: Insufficient documentation

## 2013-07-14 HISTORY — DX: Type 2 diabetes mellitus without complications: E11.9

## 2013-07-14 HISTORY — DX: Crohn's disease, unspecified, without complications: K50.90

## 2013-07-14 HISTORY — DX: Essential (primary) hypertension: I10

## 2013-07-14 HISTORY — DX: Gastro-esophageal reflux disease without esophagitis: K21.9

## 2013-07-14 HISTORY — DX: Pain in unspecified hip: M25.559

## 2013-07-14 HISTORY — DX: Other chronic pain: G89.29

## 2013-07-14 HISTORY — DX: Heart failure, unspecified: I50.9

## 2013-07-14 HISTORY — DX: Dorsalgia, unspecified: M54.9

## 2013-07-14 NOTE — ED Provider Notes (Signed)
Medical screening examination/treatment/procedure(s) were conducted as a shared visit with non-physician practitioner(s) and myself.  I personally evaluated the patient during the encounter. Pt presents w/ R lateral knee pain after a fall. On PE, VSS, pt in NAD.  +ttp over LCL/lateral knee. No ligamentous laxity, no significant effusion, or s/sx infection. XR negative for acute findings. Will d/c home w/ knee sleeve and have her f/u with her orthopedist.    EKG Interpretation None        Neta Ehlers, MD 07/14/13 1554

## 2013-07-14 NOTE — ED Provider Notes (Signed)
CSN: [id number]     Arrival date & time 07/14/13  1141 History   First MD Initiated Contact with Patient 07/14/13 1238     Chief Complaint  Patient presents with  . Knee Injury     (Consider location/radiation/quality/duration/timing/severity/associated sxs/prior Treatment) Patient is a 73 y.o. female presenting with knee pain. The history is provided by the patient.  Knee Pain Location:  Knee Injury: yes   Mechanism of injury: fall   Fall:    Fall occurred:  Standing   Impact surface:  Hard floor   Point of impact:  Knees   Entrapped after fall: no   Knee location:  R knee Pain details:    Quality:  Shooting   Radiates to:  R leg   Severity:  Moderate   Onset quality:  Sudden   Duration:  2 days   Timing:  Constant   Progression:  Unchanged Chronicity:  New Dislocation: no   Foreign body present:  No foreign bodies Prior injury to area:  No Relieved by:  None tried Worsened by:  Abduction Ineffective treatments:  None tried  Madisun Farrell is a 73 y.o. female who presents to the ED with right knee pain. She states that she was walking in her living room yesterday and her right leg gave out and she fell onto the hardwood floor. She denies head injury or LOC. Complains of painon the side of her knee that radiates to the right foot when she walks. Denies any other pain or injury. The patient's grand daughter is a Designer, jewellery. She states that the patient has been on a lot of pain medication and muscle relaxants and as a result she fall often. She has a new PCP and they are working to decrease some of her medications. The patient has a cane she can use for ambulation and uses it sometimes. She has chronic back pain, hip pain, is diabetic, has Crohn's disease, CHF and HTN.    Past Medical History  Diagnosis Date  . CHF (congestive heart failure)   . Hypertension   . Back pain, chronic   . Hip pain, chronic   . GERD (gastroesophageal reflux disease)   . Crohn disease    . Diabetes mellitus without complication    History reviewed. No pertinent past surgical history. No family history on file. History  Substance Use Topics  . Smoking status: Current Every Day Smoker  . Smokeless tobacco: Not on file  . Alcohol Use: Not on file   OB History   Grav Para Term Preterm Abortions TAB SAB Ect Mult Living                 Review of Systems Negative except as stated in HPI   Allergies  Review of patient's allergies indicates no known allergies.  Home Medications   Prior to Admission medications   Medication Sig Start Date End Date Taking? Authorizing Provider  insulin glargine (LANTUS) 100 UNIT/ML injection Inject 80 Units into the skin at bedtime.   Yes Historical Provider, MD  sulfaSALAzine (AZULFIDINE) 500 MG tablet Take 500 mg by mouth 4 (four) times daily.   Yes Historical Provider, MD  furosemide (LASIX) 40 MG tablet Take 80 mg by mouth.     Historical Provider, MD  gabapentin (NEURONTIN) 600 MG tablet Take 600 mg by mouth.      Historical Provider, MD  HYDROcodone-acetaminophen (NORCO) 10-325 MG per tablet Take 1 tablet by mouth.      Historical Provider, MD  omeprazole (PRILOSEC) 20 MG capsule Take 20 mg by mouth.      Historical Provider, MD  simvastatin (ZOCOR) 40 MG tablet Take 40 mg by mouth at bedtime.      Historical Provider, MD  sitaGLIPtin (JANUVIA) 100 MG tablet Take 50 mg by mouth.     Historical Provider, MD  spironolactone (ALDACTONE) 25 MG tablet Take 25 mg by mouth.      Historical Provider, MD  temazepam (RESTORIL) 15 MG capsule Take 15 mg by mouth.      Historical Provider, MD  tiZANidine (ZANAFLEX) 4 MG tablet Take 4 mg by mouth.      Historical Provider, MD   BP 165/67  Pulse 65  Temp(Src) 98.1 F (36.7 C) (Oral)  Resp 16  Ht 5' 6"  (1.676 m)  Wt 199 lb (90.266 kg)  BMI 32.13 kg/m2  SpO2 97% Physical Exam  Nursing note and vitals reviewed. Constitutional: She is oriented to person, place, and time. She appears  well-developed and well-nourished. No distress.  HENT:  Head: Normocephalic and atraumatic.  Eyes: Conjunctivae and EOM are normal.  Neck: Neck supple.  Cardiovascular: Normal rate.   Pulmonary/Chest: Effort normal.  Musculoskeletal:       Right knee: She exhibits decreased range of motion and swelling. She exhibits no deformity, no laceration, no erythema, normal alignment and normal patellar mobility. Tenderness found. LCL tenderness noted.  Pedal pulses equal, adequate circulation, good touch sensation. Pain over LCL with flexion of the right knee. Pain with any range of motion. Increased warmth, swelling.   Neurological: She is alert and oriented to person, place, and time. No cranial nerve deficit.  Skin: Skin is warm and dry.  Psychiatric: She has a normal mood and affect. Her behavior is normal.    ED Course  Procedures ( Dg Knee Complete 4 Views Right  07/14/2013   CLINICAL DATA:  Fall.  Right knee injury and pain.  EXAM: RIGHT KNEE - COMPLETE 4+ VIEW  COMPARISON:  None.  FINDINGS: There is no evidence of fracture, dislocation, or joint effusion. There is no evidence of arthropathy or other focal bone abnormality.  Mild enthesopathy seen involving the superior aspect of the patella at the quadriceps tendon insertion site. Mild peripheral vascular calcification also noted.  IMPRESSION: No acute findings.   Electronically Signed   By: Earle Gell M.D.   On: 07/14/2013 12:39   MDM  73 y.o. female with pain and swelling to the right knee s/p fall yesterday. Knee sleeve applied. I discussed clinical and x-ray findings with the patient and her family and plan of care. They voice understanding. Patient has access to a walker that her mother had and will use it until she has follow up with her PCP and medication adjustment. She will follow up with DR. Graves, ortho, is symptoms persist. Stable for discharge and remains neurovascularly intact. Will not add any medications to her current list.       Michelle Murrain, NP 07/14/13 1329

## 2013-07-14 NOTE — ED Notes (Signed)
Pt fell in living room, states her right leg "gave out" and she fell onto hardwood floor.  Pt states she did hit her head but no LOC.  Pt having right sided knee pain with radiates to right foot with ambulation.  Denies any other pain.

## 2013-07-14 NOTE — Discharge Instructions (Signed)
Thank you for allowing me to take care of you today. Your x-ray shows no broken bones. Your exam does show that you have inflammation and swelling of the right knee. Your tenderness is over the lateral collateral ligament. If you have injured the ligament or torn the ligament it will not show on regular x-rays. If the pain continues you should follow up with Dr. Berenice Primas.  You have a great support system with your grand daughter being a Designer, jewellery. She will make sure you have the follow up you need. Be sure you use the walker until you follow up with your doctor.

## 2013-08-27 ENCOUNTER — Encounter (HOSPITAL_BASED_OUTPATIENT_CLINIC_OR_DEPARTMENT_OTHER): Payer: Self-pay | Admitting: Emergency Medicine

## 2013-08-27 ENCOUNTER — Inpatient Hospital Stay (HOSPITAL_BASED_OUTPATIENT_CLINIC_OR_DEPARTMENT_OTHER)
Admission: EM | Admit: 2013-08-27 | Discharge: 2013-08-31 | DRG: 638 | Disposition: A | Discharge status: Home / Self Care | Payer: Medicare Other | Attending: Internal Medicine | Admitting: Internal Medicine

## 2013-08-27 ENCOUNTER — Emergency Department (HOSPITAL_BASED_OUTPATIENT_CLINIC_OR_DEPARTMENT_OTHER): Payer: Medicare Other

## 2013-08-27 DIAGNOSIS — E1142 Type 2 diabetes mellitus with diabetic polyneuropathy: Secondary | ICD-10-CM | POA: Diagnosis present

## 2013-08-27 DIAGNOSIS — I10 Essential (primary) hypertension: Secondary | ICD-10-CM

## 2013-08-27 DIAGNOSIS — N179 Acute kidney failure, unspecified: Secondary | ICD-10-CM | POA: Diagnosis present

## 2013-08-27 DIAGNOSIS — N183 Chronic kidney disease, stage 3 unspecified: Secondary | ICD-10-CM | POA: Diagnosis present

## 2013-08-27 DIAGNOSIS — T7840XA Allergy, unspecified, initial encounter: Secondary | ICD-10-CM

## 2013-08-27 DIAGNOSIS — G8929 Other chronic pain: Secondary | ICD-10-CM | POA: Diagnosis present

## 2013-08-27 DIAGNOSIS — R51 Headache: Secondary | ICD-10-CM | POA: Diagnosis present

## 2013-08-27 DIAGNOSIS — M130 Polyarthritis, unspecified: Secondary | ICD-10-CM

## 2013-08-27 DIAGNOSIS — E876 Hypokalemia: Secondary | ICD-10-CM | POA: Diagnosis present

## 2013-08-27 DIAGNOSIS — E119 Type 2 diabetes mellitus without complications: Secondary | ICD-10-CM

## 2013-08-27 DIAGNOSIS — K5090 Crohn's disease, unspecified, without complications: Secondary | ICD-10-CM | POA: Diagnosis present

## 2013-08-27 DIAGNOSIS — R030 Elevated blood-pressure reading, without diagnosis of hypertension: Secondary | ICD-10-CM

## 2013-08-27 DIAGNOSIS — E1149 Type 2 diabetes mellitus with other diabetic neurological complication: Secondary | ICD-10-CM | POA: Diagnosis present

## 2013-08-27 DIAGNOSIS — K449 Diaphragmatic hernia without obstruction or gangrene: Secondary | ICD-10-CM

## 2013-08-27 DIAGNOSIS — I509 Heart failure, unspecified: Secondary | ICD-10-CM | POA: Diagnosis present

## 2013-08-27 DIAGNOSIS — E1310 Other specified diabetes mellitus with ketoacidosis without coma: Principal | ICD-10-CM | POA: Diagnosis present

## 2013-08-27 DIAGNOSIS — R739 Hyperglycemia, unspecified: Secondary | ICD-10-CM

## 2013-08-27 DIAGNOSIS — E1110 Type 2 diabetes mellitus with ketoacidosis without coma: Secondary | ICD-10-CM | POA: Diagnosis present

## 2013-08-27 DIAGNOSIS — I129 Hypertensive chronic kidney disease with stage 1 through stage 4 chronic kidney disease, or unspecified chronic kidney disease: Secondary | ICD-10-CM | POA: Diagnosis present

## 2013-08-27 DIAGNOSIS — K222 Esophageal obstruction: Secondary | ICD-10-CM

## 2013-08-27 DIAGNOSIS — R062 Wheezing: Secondary | ICD-10-CM | POA: Diagnosis present

## 2013-08-27 DIAGNOSIS — Z8711 Personal history of peptic ulcer disease: Secondary | ICD-10-CM

## 2013-08-27 DIAGNOSIS — R0602 Shortness of breath: Secondary | ICD-10-CM

## 2013-08-27 DIAGNOSIS — E785 Hyperlipidemia, unspecified: Secondary | ICD-10-CM | POA: Diagnosis present

## 2013-08-27 DIAGNOSIS — G589 Mononeuropathy, unspecified: Secondary | ICD-10-CM

## 2013-08-27 DIAGNOSIS — J449 Chronic obstructive pulmonary disease, unspecified: Secondary | ICD-10-CM | POA: Diagnosis present

## 2013-08-27 DIAGNOSIS — G4733 Obstructive sleep apnea (adult) (pediatric): Secondary | ICD-10-CM

## 2013-08-27 DIAGNOSIS — F17200 Nicotine dependence, unspecified, uncomplicated: Secondary | ICD-10-CM | POA: Diagnosis present

## 2013-08-27 DIAGNOSIS — J4489 Other specified chronic obstructive pulmonary disease: Secondary | ICD-10-CM | POA: Diagnosis present

## 2013-08-27 DIAGNOSIS — K219 Gastro-esophageal reflux disease without esophagitis: Secondary | ICD-10-CM | POA: Diagnosis present

## 2013-08-27 LAB — CBC WITH DIFFERENTIAL/PLATELET
Basophils Absolute: 0 10*3/uL (ref 0.0–0.1)
Basophils Relative: 0 % (ref 0–1)
EOS PCT: 2 % (ref 0–5)
Eosinophils Absolute: 0.2 10*3/uL (ref 0.0–0.7)
HEMATOCRIT: 41.5 % (ref 36.0–46.0)
HEMOGLOBIN: 14.9 g/dL (ref 12.0–15.0)
Lymphocytes Relative: 30 % (ref 12–46)
Lymphs Abs: 2.9 10*3/uL (ref 0.7–4.0)
MCH: 31.7 pg (ref 26.0–34.0)
MCHC: 35.9 g/dL (ref 30.0–36.0)
MCV: 88.3 fL (ref 78.0–100.0)
MONOS PCT: 15 % — AB (ref 3–12)
Monocytes Absolute: 1.4 10*3/uL — ABNORMAL HIGH (ref 0.1–1.0)
Neutro Abs: 5 10*3/uL (ref 1.7–7.7)
Neutrophils Relative %: 53 % (ref 43–77)
Platelets: 170 10*3/uL (ref 150–400)
RBC: 4.7 MIL/uL (ref 3.87–5.11)
RDW: 14.6 % (ref 11.5–15.5)
WBC: 9.5 10*3/uL (ref 4.0–10.5)

## 2013-08-27 LAB — BASIC METABOLIC PANEL
BUN: 18 mg/dL (ref 6–23)
CALCIUM: 11 mg/dL — AB (ref 8.4–10.5)
CO2: 19 meq/L (ref 19–32)
Chloride: 95 mEq/L — ABNORMAL LOW (ref 96–112)
Creatinine, Ser: 1.4 mg/dL — ABNORMAL HIGH (ref 0.50–1.10)
GFR calc Af Amer: 43 mL/min — ABNORMAL LOW (ref >90)
GFR calc non Af Amer: 37 mL/min — ABNORMAL LOW (ref >90)
GLUCOSE: 621 mg/dL — AB (ref 70–99)
Potassium: 5.3 mEq/L (ref 3.7–5.3)
SODIUM: 135 meq/L — AB (ref 137–147)

## 2013-08-27 LAB — CBG MONITORING, ED
Glucose-Capillary: 595 mg/dL (ref 70–99)
Glucose-Capillary: 498 mg/dL — ABNORMAL HIGH (ref 70–99)

## 2013-08-27 LAB — URINALYSIS, ROUTINE W REFLEX MICROSCOPIC
BILIRUBIN URINE: NEGATIVE
Hgb urine dipstick: NEGATIVE
KETONES UR: NEGATIVE mg/dL
LEUKOCYTES UA: NEGATIVE
Nitrite: NEGATIVE
Protein, ur: NEGATIVE mg/dL
SPECIFIC GRAVITY, URINE: 1.024 (ref 1.005–1.030)
Urobilinogen, UA: 0.2 mg/dL (ref 0.0–1.0)
pH: 5 (ref 5.0–8.0)

## 2013-08-27 LAB — I-STAT CG4 LACTIC ACID, ED: LACTIC ACID, VENOUS: 1.47 mmol/L (ref 0.5–2.2)

## 2013-08-27 LAB — URINE MICROSCOPIC-ADD ON

## 2013-08-27 LAB — PRO B NATRIURETIC PEPTIDE: Pro B Natriuretic peptide (BNP): 263.1 pg/mL — ABNORMAL HIGH (ref 0–125)

## 2013-08-27 MED ORDER — SODIUM CHLORIDE 0.9 % IV SOLN
INTRAVENOUS | Status: DC
  Filled 2013-08-27: qty 1

## 2013-08-27 MED ORDER — ONDANSETRON HCL 4 MG/2ML IJ SOLN: 4.0000 mg | Freq: Three times a day (TID) | INTRAMUSCULAR | Status: DC | PRN

## 2013-08-27 MED ORDER — SODIUM CHLORIDE 0.9 % IV SOLN: Freq: Once | INTRAVENOUS | Status: DC

## 2013-08-27 MED ORDER — INSULIN REGULAR HUMAN 100 UNIT/ML IJ SOLN
INTRAMUSCULAR | Status: AC
  Administered 2013-08-27: 100 [IU]
  Filled 2013-08-27: qty 1

## 2013-08-27 MED ORDER — SODIUM CHLORIDE 0.9 % IV BOLUS (SEPSIS)
500.0000 mL | Freq: Once | INTRAVENOUS | Status: AC
  Administered 2013-08-27: 500 mL via INTRAVENOUS

## 2013-08-27 MED ORDER — LABETALOL HCL 5 MG/ML IV SOLN
10.0000 mg | Freq: Once | INTRAVENOUS | Status: AC
  Administered 2013-08-27: 10 mg via INTRAVENOUS
  Filled 2013-08-27: qty 4

## 2013-08-27 MED ORDER — INSULIN ASPART 100 UNIT/ML ~~LOC~~ SOLN
SUBCUTANEOUS | Status: AC
  Filled 2013-08-27: qty 1

## 2013-08-27 MED ORDER — INSULIN ASPART 100 UNIT/ML IV SOLN
10.0000 [IU] | Freq: Once | INTRAVENOUS | Status: AC
  Administered 2013-08-27: 10 [IU] via INTRAVENOUS
  Filled 2013-08-27: qty 0.1

## 2013-08-27 NOTE — ED Provider Notes (Addendum)
CSN: [id number]     Arrival date & time 08/27/13  2058 History  This chart was scribed for Mariea Clonts, MD by Vernell Barrier, ED scribe. This patient was seen in room MH06/MH06 and the patient's care was started at 9:50 PM.   Chief Complaint  Patient presents with  . Hyperglycemia    Patient is a 73 y.o. female presenting with hyperglycemia. The history is provided by the patient. No language interpreter was used.  Hyperglycemia Blood sugar level PTA:  600 Severity:  Severe Onset quality:  Gradual Duration:  1 day Timing:  Constant Progression:  Unchanged Chronicity:  New Diabetes status:  Controlled with insulin Context: change in medication   Relieved by:  Insulin Ineffective treatments:  None tried Associated symptoms: no abdominal pain, no blurred vision, no chest pain, no confusion, no fever, no nausea, no shortness of breath, no vomiting and no weight change   Risk factors: no hx of DKA    HPI Comments: Michelle Farrell is a 73 y.o. 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 Reports some nagging HAs over the past couple of days but Went to see PCP today and states lab work showed a glucose of 600. Has not been checking BS regularly. No hx of DKA. Started a new BP medication today which she takes once every 12 hours. Has had CHF. Denies fever, chills, chest pain, numbness, SOB, difficulty swallowing, nausea, vomiting, diarrhea, or blurred vision, confusion.   Past Medical History  Diagnosis Date  . CHF (congestive heart failure)   . Hypertension   . Back pain, chronic   . Hip pain, chronic   . GERD (gastroesophageal reflux disease)   . Crohn disease   . Diabetes mellitus without complication    Past Surgical History  Procedure Laterality Date  . Abdominal hysterectomy    . Tonsillectomy    . Cholecystectomy    . Bladder surgery     No family history on  file. History  Substance Use Topics  . Smoking status: Current Every Day Smoker  . Smokeless tobacco: Not on file  . Alcohol Use: No   OB History   Grav Para Term Preterm Abortions TAB SAB Ect Mult Living                 Review of Systems  Constitutional: Negative for fever, chills and unexpected weight change.  HENT: Negative for trouble swallowing.   Eyes: Negative for blurred vision.  Respiratory: Negative for shortness of breath.   Cardiovascular: Negative for chest pain and leg swelling.  Gastrointestinal: Negative for nausea, vomiting, abdominal pain and diarrhea.  Neurological: Negative for weakness and numbness.  Psychiatric/Behavioral: Negative for confusion.  All other systems reviewed and are negative.  Allergies  Review of patient's allergies indicates no known allergies.  Home Medications   Prior to Admission medications   Medication Sig Start Date End Date Taking? Authorizing Provider  PRAVASTATIN SODIUM PO Take by mouth.   Yes Historical Provider, MD  UNKNOWN TO PATIENT BP med added today   Yes Historical Provider, MD  furosemide (LASIX) 40 MG tablet Take 80 mg by mouth.     Historical Provider, MD  gabapentin (NEURONTIN) 600 MG tablet Take 600 mg by mouth.      Historical Provider, MD  HYDROcodone-acetaminophen (NORCO) 10-325 MG per tablet Take 1 tablet by mouth.      Historical Provider, MD  insulin  glargine (LANTUS) 100 UNIT/ML injection Inject 80 Units into the skin at bedtime.    Historical Provider, MD  omeprazole (PRILOSEC) 20 MG capsule Take 20 mg by mouth.      Historical Provider, MD  simvastatin (ZOCOR) 40 MG tablet Take 40 mg by mouth at bedtime.      Historical Provider, MD  sitaGLIPtin (JANUVIA) 100 MG tablet Take 50 mg by mouth.     Historical Provider, MD  spironolactone (ALDACTONE) 25 MG tablet Take 25 mg by mouth.      Historical Provider, MD  sulfaSALAzine (AZULFIDINE) 500 MG tablet Take 500 mg by mouth 4 (four) times daily.    Historical  Provider, MD  temazepam (RESTORIL) 15 MG capsule Take 15 mg by mouth.      Historical Provider, MD  tiZANidine (ZANAFLEX) 4 MG tablet Take 4 mg by mouth.      Historical Provider, MD   Triage vitals: BP 228/64  Pulse 83  Temp(Src) 99.3 F (37.4 C) (Oral)  Resp 20  Ht 5' 6"  (1.676 m)  Wt 191 lb (86.637 kg)  BMI 30.84 kg/m2  SpO2 99% Physical Exam  Nursing note and vitals reviewed. Constitutional: She is oriented to person, place, and time. She appears well-developed and well-nourished. No distress.  HENT:  Head: Normocephalic and atraumatic.  Eyes: Conjunctivae and EOM are normal. Pupils are equal, round, and reactive to light.  Neck: Normal range of motion. No tracheal deviation present.  Cardiovascular: Normal rate and regular rhythm.   Murmur (left sternal border 3+) heard. Pulmonary/Chest: Effort normal and breath sounds normal. No respiratory distress. She has no wheezes. She has no rales.  Abdominal: Soft. There is no tenderness.  Musculoskeletal: Normal range of motion. She exhibits no edema and no tenderness.  Neurological: She is alert and oriented to person, place, and time.  Grossly intact. Cranial nerves intact.  Skin: Skin is warm and dry. No rash noted.  No obvious skin rash on areas examined.   Psychiatric: She has a normal mood and affect. Her behavior is normal.    ED Course  Procedures (including critical care time) CRITICAL CARE Performed by: Mariea Clonts   Total critical care time: 30 min  Critical care time was exclusive of separately billable procedures and treating other patients.  Critical care was necessary to treat or prevent imminent or life-threatening deterioration.  Critical care was time spent personally by me on the following activities: development of treatment plan with patient and/or surrogate as well as nursing, discussions with consultants, evaluation of patient's response to treatment, examination of patient, obtaining history from  patient or surrogate, ordering and performing treatments and interventions, ordering and review of laboratory studies, ordering and review of radiographic studies, pulse oximetry and re-evaluation of patient's condition.  DIAGNOSTIC STUDIES: Oxygen Saturation is 99% on room air, normal by my interpretation.    COORDINATION OF CARE: At 9:55 PM: Discussed treatment plan with patient which includes blood work. Patient agrees.   Labs Review Labs Reviewed  CBC WITH DIFFERENTIAL - Abnormal; Notable for the following:    Monocytes Relative 15 (*)    Monocytes Absolute 1.4 (*)    All other components within normal limits  BASIC METABOLIC PANEL - Abnormal; Notable for the following:    Sodium 135 (*)    Chloride 95 (*)    Glucose, Bld 621 (*)    Creatinine, Ser 1.40 (*)    Calcium 11.0 (*)    GFR calc non Af Amer 37 (*)  GFR calc Af Amer 43 (*)    All other components within normal limits  PRO B NATRIURETIC PEPTIDE - Abnormal; Notable for the following:    Pro B Natriuretic peptide (BNP) 263.1 (*)    All other components within normal limits  URINALYSIS, ROUTINE W REFLEX MICROSCOPIC - Abnormal; Notable for the following:    Glucose, UA >1000 (*)    All other components within normal limits  CBG MONITORING, ED - Abnormal; Notable for the following:    Glucose-Capillary 595 (*)    All other components within normal limits  CBG MONITORING, ED - Abnormal; Notable for the following:    Glucose-Capillary 498 (*)    All other components within normal limits  URINE MICROSCOPIC-ADD ON  I-STAT CG4 LACTIC ACID, ED  CBG MONITORING, ED  CBG MONITORING, ED   Imaging Review Dg Chest 2 View  08/27/2013   CLINICAL DATA:  Hyperglycemia.  EXAM: CHEST  2 VIEW  COMPARISON:  Chest radiograph November 28, 2008.  FINDINGS: Cardiac silhouette is unremarkable, mildly calcified aorta. Similar mild increased lung volumes and interstitial prominence without pleural effusions or focal consolidations. Trachea  projects midline and there is no pneumothorax.  Suture anchor right humeral head. Surgical clips in the abdomen may reflect cholecystectomy.  IMPRESSION: COPD without superimposed acute cardiopulmonary process.   Electronically Signed   By: Elon Alas   On: 08/27/2013 22:52     EKG Interpretation   Date/Time:  Tuesday August 27 2013 23:45:06 EDT Ventricular Rate:  93 PR Interval:  154 QRS Duration: 86 QT Interval:  344 QTC Calculation: 427 R Axis:   -74 Text Interpretation:  Normal sinus rhythm Left anterior fascicular block  Abnormal ECG Confirmed by Jarek Longton  MD, Antonae Zbikowski ([id number]) on 08/27/2013 11:56:24  PM      MDM   Final diagnoses:  Hypertension  DKA, type 2  Hyperglycemia  ARF Hypokalemia  I personally performed the services described in this documentation, which was scribed in my presence. The recorded information has been reviewed and is accurate.  Patient presents with uncontrolled diabetes and has not checked her glucose in the past months. No CHF history initially did not give a fluid bolus. Clinically lungs are clear no signs of acute CHF exacerbation, chest x-ray no acute findings reviewed. Small fluid bolus and normal saline infusion started. Small bolus of insulin followed by insulin drip ordered. Patient has anion gap of 21.  Multiple rechecks in the emergency department, no new symptoms and blood pressure improved. Blood pressure improved to 683M systolic without treatment. Labetalol 10 mg IV ordered. Patient has no chest pain, shortness of breath or new headaches. Normal neurologic exam in ER. IV insulin drip in ER. Glucose ordered hourly. Plan for repeat BMP pending transfer. Discussed with Dr. Posey Pronto triad hospitalist for transfer to [facility] for further treatment, he requested to step down to start.  The patients results and plan were reviewed and discussed.   Any x-rays performed were personally reviewed by myself.   Differential diagnosis were considered  with the presenting HPI.   Filed Vitals:   08/27/13 2113 08/27/13 2114 08/27/13 2337  BP: 215/61 228/64 193/75  Pulse: 83  91  Temp: 99.3 F (37.4 C)    TempSrc: Oral    Resp: 20  18  Height: 5' 6"  (1.676 m)    Weight: 191 lb (86.637 kg)    SpO2: 99%  97%    Admission/ observation were discussed with the admitting physician, patient and/or family and  they are comfortable with the plan.   Notified that [facility] has no step down beds and they recommended transfer to [facility]. Discuss case with Dr.kakrakandi who agreed with transfer.  Mariea Clonts, MD 08/27/13 2358  Mariea Clonts, MD 08/28/13 6701  Transfer pending recheck electrolytes potassium 2.9 and sugar 260. Insulin drip held and potassium infusion ordered. Normal saline infusion change to D5 normal saline with potassium. Plan to recheck BMP in one hour and hold insulin drip until then or until transfer. I discussed this with the nurse at the bedside.   Mariea Clonts, MD 08/28/13 [phone]

## 2013-08-27 NOTE — ED Notes (Signed)
Pt stats she was called by PCP tonight-advised that BS was 600 from labs drawn in office today

## 2013-08-27 NOTE — ED Notes (Signed)
Sent here by pcp for blood sugar of 600  And htn,  Has not checked cbg at home due to being out of test strips,  Has not had here 80 units on lantus this pm   Denies any symptoms

## 2013-08-28 ENCOUNTER — Encounter (HOSPITAL_COMMUNITY): Payer: Self-pay | Admitting: Internal Medicine

## 2013-08-28 DIAGNOSIS — I10 Essential (primary) hypertension: Secondary | ICD-10-CM

## 2013-08-28 DIAGNOSIS — I509 Heart failure, unspecified: Secondary | ICD-10-CM

## 2013-08-28 DIAGNOSIS — E1110 Type 2 diabetes mellitus with ketoacidosis without coma: Secondary | ICD-10-CM

## 2013-08-28 DIAGNOSIS — E119 Type 2 diabetes mellitus without complications: Secondary | ICD-10-CM

## 2013-08-28 DIAGNOSIS — E785 Hyperlipidemia, unspecified: Secondary | ICD-10-CM

## 2013-08-28 DIAGNOSIS — M130 Polyarthritis, unspecified: Secondary | ICD-10-CM

## 2013-08-28 DIAGNOSIS — R7309 Other abnormal glucose: Secondary | ICD-10-CM

## 2013-08-28 LAB — GLUCOSE, CAPILLARY
GLUCOSE-CAPILLARY: 382 mg/dL — AB (ref 70–99)
GLUCOSE-CAPILLARY: 289 mg/dL — AB (ref 70–99)
GLUCOSE-CAPILLARY: 178 mg/dL — AB (ref 70–99)
GLUCOSE-CAPILLARY: 133 mg/dL — AB (ref 70–99)
GLUCOSE-CAPILLARY: 176 mg/dL — AB (ref 70–99)
Glucose-Capillary: 359 mg/dL — ABNORMAL HIGH (ref 70–99)
Glucose-Capillary: 371 mg/dL — ABNORMAL HIGH (ref 70–99)
Glucose-Capillary: 336 mg/dL — ABNORMAL HIGH (ref 70–99)
Glucose-Capillary: 163 mg/dL — ABNORMAL HIGH (ref 70–99)
Glucose-Capillary: 133 mg/dL — ABNORMAL HIGH (ref 70–99)
Glucose-Capillary: 176 mg/dL — ABNORMAL HIGH (ref 70–99)
Glucose-Capillary: 185 mg/dL — ABNORMAL HIGH (ref 70–99)
Glucose-Capillary: 167 mg/dL — ABNORMAL HIGH (ref 70–99)
Glucose-Capillary: 160 mg/dL — ABNORMAL HIGH (ref 70–99)
Glucose-Capillary: 174 mg/dL — ABNORMAL HIGH (ref 70–99)
Glucose-Capillary: 176 mg/dL — ABNORMAL HIGH (ref 70–99)
Glucose-Capillary: 295 mg/dL — ABNORMAL HIGH (ref 70–99)

## 2013-08-28 LAB — BASIC METABOLIC PANEL
BUN: 17 mg/dL (ref 6–23)
BUN: 14 mg/dL (ref 6–23)
BUN: 13 mg/dL (ref 6–23)
BUN: 10 mg/dL (ref 6–23)
BUN: 10 mg/dL (ref 6–23)
BUN: 9 mg/dL (ref 6–23)
BUN: 8 mg/dL (ref 6–23)
CALCIUM: 10.4 mg/dL (ref 8.4–10.5)
CALCIUM: 10.4 mg/dL (ref 8.4–10.5)
CALCIUM: 10.2 mg/dL (ref 8.4–10.5)
CALCIUM: 10.2 mg/dL (ref 8.4–10.5)
CALCIUM: 10 mg/dL (ref 8.4–10.5)
CALCIUM: 10.3 mg/dL (ref 8.4–10.5)
CHLORIDE: 107 meq/L (ref 96–112)
CHLORIDE: 107 meq/L (ref 96–112)
CO2: 19 meq/L (ref 19–32)
CO2: 20 mEq/L (ref 19–32)
CO2: 19 meq/L (ref 19–32)
CO2: 18 mEq/L — ABNORMAL LOW (ref 19–32)
CO2: 17 meq/L — AB (ref 19–32)
CO2: 18 meq/L — AB (ref 19–32)
CO2: 17 mEq/L — ABNORMAL LOW (ref 19–32)
CREATININE: 1.26 mg/dL — AB (ref 0.50–1.10)
CREATININE: 1.11 mg/dL — AB (ref 0.50–1.10)
CREATININE: 0.97 mg/dL (ref 0.50–1.10)
CREATININE: 0.97 mg/dL (ref 0.50–1.10)
CREATININE: 0.95 mg/dL (ref 0.50–1.10)
Calcium: 10.4 mg/dL (ref 8.4–10.5)
Chloride: 100 mEq/L (ref 96–112)
Chloride: 105 mEq/L (ref 96–112)
Chloride: 108 mEq/L (ref 96–112)
Chloride: 104 mEq/L (ref 96–112)
Chloride: 107 mEq/L (ref 96–112)
Creatinine, Ser: 1.05 mg/dL (ref 0.50–1.10)
Creatinine, Ser: 0.96 mg/dL (ref 0.50–1.10)
GFR calc Af Amer: 48 mL/min — ABNORMAL LOW (ref >90)
GFR calc Af Amer: 57 mL/min — ABNORMAL LOW (ref >90)
GFR calc Af Amer: 60 mL/min — ABNORMAL LOW (ref >90)
GFR calc Af Amer: 67 mL/min — ABNORMAL LOW (ref >90)
GFR calc Af Amer: 67 mL/min — ABNORMAL LOW (ref >90)
GFR calc non Af Amer: 42 mL/min — ABNORMAL LOW (ref >90)
GFR calc non Af Amer: 49 mL/min — ABNORMAL LOW (ref >90)
GFR calc non Af Amer: 52 mL/min — ABNORMAL LOW (ref >90)
GFR calc non Af Amer: 58 mL/min — ABNORMAL LOW (ref >90)
GFR calc non Af Amer: 57 mL/min — ABNORMAL LOW (ref >90)
GFR, EST AFRICAN AMERICAN: 67 mL/min — AB (ref >90)
GFR, EST AFRICAN AMERICAN: 68 mL/min — AB (ref >90)
GFR, EST NON AFRICAN AMERICAN: 57 mL/min — AB (ref >90)
GFR, EST NON AFRICAN AMERICAN: 59 mL/min — AB (ref >90)
GLUCOSE: 384 mg/dL — AB (ref 70–99)
GLUCOSE: 162 mg/dL — AB (ref 70–99)
GLUCOSE: 244 mg/dL — AB (ref 70–99)
GLUCOSE: 296 mg/dL — AB (ref 70–99)
GLUCOSE: 168 mg/dL — AB (ref 70–99)
Glucose, Bld: 135 mg/dL — ABNORMAL HIGH (ref 70–99)
Glucose, Bld: 179 mg/dL — ABNORMAL HIGH (ref 70–99)
POTASSIUM: 4 meq/L (ref 3.7–5.3)
Potassium: 3.7 mEq/L (ref 3.7–5.3)
Potassium: 3.9 mEq/L (ref 3.7–5.3)
Potassium: 3.9 mEq/L (ref 3.7–5.3)
Potassium: 3.8 mEq/L (ref 3.7–5.3)
Potassium: 4 mEq/L (ref 3.7–5.3)
Potassium: 3.7 mEq/L (ref 3.7–5.3)
SODIUM: 138 meq/L (ref 137–147)
Sodium: 141 mEq/L (ref 137–147)
Sodium: 142 mEq/L (ref 137–147)
Sodium: 142 mEq/L (ref 137–147)
Sodium: 140 mEq/L (ref 137–147)
Sodium: 139 mEq/L (ref 137–147)
Sodium: 140 mEq/L (ref 137–147)

## 2013-08-28 LAB — CBC WITH DIFFERENTIAL/PLATELET
Basophils Absolute: 0 10*3/uL (ref 0.0–0.1)
Basophils Relative: 0 % (ref 0–1)
EOS ABS: 0.2 10*3/uL (ref 0.0–0.7)
EOS PCT: 2 % (ref 0–5)
HCT: 38.4 % (ref 36.0–46.0)
HEMOGLOBIN: 13.3 g/dL (ref 12.0–15.0)
LYMPHS ABS: 3.3 10*3/uL (ref 0.7–4.0)
Lymphocytes Relative: 29 % (ref 12–46)
MCH: 30.7 pg (ref 26.0–34.0)
MCHC: 34.6 g/dL (ref 30.0–36.0)
MCV: 88.7 fL (ref 78.0–100.0)
MONO ABS: 1.3 10*3/uL — AB (ref 0.1–1.0)
Monocytes Relative: 11 % (ref 3–12)
Neutro Abs: 6.6 10*3/uL (ref 1.7–7.7)
Neutrophils Relative %: 58 % (ref 43–77)
Platelets: 185 10*3/uL (ref 150–400)
RBC: 4.33 MIL/uL (ref 3.87–5.11)
RDW: 15.1 % (ref 11.5–15.5)
WBC: 11.4 10*3/uL — AB (ref 4.0–10.5)

## 2013-08-28 LAB — CBG MONITORING, ED: GLUCOSE-CAPILLARY: 332 mg/dL — AB (ref 70–99)

## 2013-08-28 LAB — TROPONIN I: Troponin I: <0.3 ng/mL (ref <0.30)

## 2013-08-28 LAB — HEMOGLOBIN A1C
Hgb A1c MFr Bld: 9.1 % — ABNORMAL HIGH (ref <5.7)
Mean Plasma Glucose: 214 mg/dL — ABNORMAL HIGH (ref <117)

## 2013-08-28 LAB — I-STAT CHEM 8, ED
BUN: 16 mg/dL (ref 6–23)
CHLORIDE: 103 meq/L (ref 96–112)
Calcium, Ion: 1.37 mmol/L — ABNORMAL HIGH (ref 1.13–1.30)
Creatinine, Ser: 1.5 mg/dL — ABNORMAL HIGH (ref 0.50–1.10)
GLUCOSE: 260 mg/dL — AB (ref 70–99)
HEMATOCRIT: 44 % (ref 36.0–46.0)
Hemoglobin: 15 g/dL (ref 12.0–15.0)
Potassium: 2.9 mEq/L — CL (ref 3.7–5.3)
Sodium: 143 mEq/L (ref 137–147)
TCO2: 22 mmol/L (ref 0–100)

## 2013-08-28 LAB — MAGNESIUM: Magnesium: 1.8 mg/dL (ref 1.5–2.5)

## 2013-08-28 LAB — MRSA PCR SCREENING: MRSA BY PCR: INVALID — AB

## 2013-08-28 LAB — PRO B NATRIURETIC PEPTIDE: Pro B Natriuretic peptide (BNP): 251 pg/mL — ABNORMAL HIGH (ref 0–125)

## 2013-08-28 LAB — TSH: TSH: 1.16 u[IU]/mL (ref 0.350–4.500)

## 2013-08-28 MED ORDER — DEXTROSE 50 % IV SOLN: 25.0000 mL | INTRAVENOUS | Status: DC | PRN

## 2013-08-28 MED ORDER — HYDROCODONE-ACETAMINOPHEN 10-325 MG PO TABS
1.0000 | ORAL_TABLET | Freq: Four times a day (QID) | ORAL | Status: DC | PRN
  Administered 2013-08-28 – 2013-08-31 (×7): 1 via ORAL
  Filled 2013-08-28 (×7): qty 1

## 2013-08-28 MED ORDER — INSULIN GLARGINE 100 UNIT/ML ~~LOC~~ SOLN
10.0000 [IU] | Freq: Every day | SUBCUTANEOUS | Status: DC
  Filled 2013-08-28: qty 0.1

## 2013-08-28 MED ORDER — KCL IN DEXTROSE-NACL 40-5-0.9 MEQ/L-%-% IV SOLN
INTRAVENOUS | Status: DC
  Filled 2013-08-28: qty 1000

## 2013-08-28 MED ORDER — HYDRALAZINE HCL 20 MG/ML IJ SOLN
INTRAMUSCULAR | Status: AC
  Filled 2013-08-28: qty 1

## 2013-08-28 MED ORDER — POTASSIUM CHLORIDE 10 MEQ/100ML IV SOLN
10.0000 meq | Freq: Once | INTRAVENOUS | Status: AC
  Administered 2013-08-28: 10 meq via INTRAVENOUS
  Filled 2013-08-28: qty 100

## 2013-08-28 MED ORDER — PANTOPRAZOLE SODIUM 40 MG PO TBEC
40.0000 mg | DELAYED_RELEASE_TABLET | Freq: Every day | ORAL | Status: DC
  Administered 2013-08-28 – 2013-08-31 (×4): 40 mg via ORAL
  Filled 2013-08-28 (×5): qty 1

## 2013-08-28 MED ORDER — HYDRALAZINE HCL 20 MG/ML IJ SOLN
10.0000 mg | INTRAMUSCULAR | Status: DC | PRN
  Administered 2013-08-28 – 2013-08-29 (×5): 10 mg via INTRAVENOUS
  Filled 2013-08-28 (×5): qty 1
  Filled 2013-08-28: qty 0.5
  Filled 2013-08-28: qty 1

## 2013-08-28 MED ORDER — DEXTROSE-NACL 5-0.9 % IV SOLN
Freq: Once | INTRAVENOUS | Status: AC
  Administered 2013-08-28: 1000 mL via INTRAVENOUS

## 2013-08-28 MED ORDER — TEMAZEPAM 15 MG PO CAPS: 15.0000 mg | ORAL_CAPSULE | Freq: Every day | ORAL | Status: DC

## 2013-08-28 MED ORDER — SODIUM CHLORIDE 0.9 % IV SOLN
INTRAVENOUS | Status: DC
  Administered 2013-08-28: 05:00:00 via INTRAVENOUS

## 2013-08-28 MED ORDER — GABAPENTIN 300 MG PO CAPS
600.0000 mg | ORAL_CAPSULE | Freq: Three times a day (TID) | ORAL | Status: DC
  Administered 2013-08-28 – 2013-08-31 (×9): 600 mg via ORAL
  Filled 2013-08-28 (×15): qty 2

## 2013-08-28 MED ORDER — POTASSIUM CHLORIDE 10 MEQ/100ML IV SOLN: 10.0000 meq | INTRAVENOUS | Status: DC

## 2013-08-28 MED ORDER — DEXTROSE-NACL 5-0.45 % IV SOLN
INTRAVENOUS | Status: DC
Start: 2013-08-28 — End: 2013-08-29
  Administered 2013-08-28 – 2013-08-29 (×3): via INTRAVENOUS

## 2013-08-28 MED ORDER — POTASSIUM CHLORIDE CRYS ER 20 MEQ PO TBCR
40.0000 meq | EXTENDED_RELEASE_TABLET | Freq: Once | ORAL | Status: AC
  Administered 2013-08-28: 40 meq via ORAL
  Filled 2013-08-28: qty 2

## 2013-08-28 MED ORDER — TRIAMCINOLONE ACETONIDE 0.1 % EX CREA
1.0000 "application " | TOPICAL_CREAM | CUTANEOUS | Status: DC | PRN
  Filled 2013-08-28: qty 15

## 2013-08-28 MED ORDER — POTASSIUM CHLORIDE 10 MEQ/100ML IV SOLN
10.0000 meq | INTRAVENOUS | Status: AC
  Filled 2013-08-28: qty 100

## 2013-08-28 MED ORDER — LABETALOL HCL 5 MG/ML IV SOLN
10.0000 mg | INTRAVENOUS | Status: DC | PRN
  Administered 2013-08-28 – 2013-08-29 (×9): 10 mg via INTRAVENOUS
  Filled 2013-08-28 (×9): qty 4

## 2013-08-28 MED ORDER — ALBUTEROL SULFATE (2.5 MG/3ML) 0.083% IN NEBU
2.5000 mg | INHALATION_SOLUTION | Freq: Two times a day (BID) | RESPIRATORY_TRACT | Status: DC
  Administered 2013-08-28 – 2013-08-31 (×7): 2.5 mg via RESPIRATORY_TRACT
  Filled 2013-08-28 (×7): qty 3

## 2013-08-28 MED ORDER — BUDESONIDE 0.25 MG/2ML IN SUSP
0.2500 mg | Freq: Two times a day (BID) | RESPIRATORY_TRACT | Status: DC
  Administered 2013-08-28 – 2013-08-31 (×7): 0.25 mg via RESPIRATORY_TRACT
  Filled 2013-08-28 (×9): qty 2

## 2013-08-28 MED ORDER — HYDRALAZINE HCL 50 MG PO TABS
50.0000 mg | ORAL_TABLET | Freq: Two times a day (BID) | ORAL | Status: DC
  Administered 2013-08-28: 50 mg via ORAL
  Filled 2013-08-28: qty 1

## 2013-08-28 MED ORDER — TEMAZEPAM 15 MG PO CAPS
30.0000 mg | ORAL_CAPSULE | Freq: Every evening | ORAL | Status: DC | PRN
  Administered 2013-08-28 – 2013-08-30 (×2): 30 mg via ORAL
  Filled 2013-08-28 (×2): qty 2

## 2013-08-28 MED ORDER — HYDRALAZINE HCL 20 MG/ML IJ SOLN
10.0000 mg | Freq: Once | INTRAMUSCULAR | Status: AC
  Administered 2013-08-28: 10 mg via INTRAVENOUS

## 2013-08-28 MED ORDER — POTASSIUM CHLORIDE CRYS ER 20 MEQ PO TBCR: 40.0000 meq | EXTENDED_RELEASE_TABLET | Freq: Once | ORAL | Status: DC

## 2013-08-28 MED ORDER — ALBUTEROL SULFATE (2.5 MG/3ML) 0.083% IN NEBU
2.5000 mg | INHALATION_SOLUTION | RESPIRATORY_TRACT | Status: DC
  Administered 2013-08-28: 2.5 mg via RESPIRATORY_TRACT
  Filled 2013-08-28: qty 3

## 2013-08-28 MED ORDER — INSULIN REGULAR HUMAN 100 UNIT/ML IJ SOLN
INTRAMUSCULAR | Status: DC
  Administered 2013-08-28: 3.2 [IU]/h via INTRAVENOUS
  Administered 2013-08-28: 9 [IU]/h via INTRAVENOUS
  Administered 2013-08-28: 6.2 [IU]/h via INTRAVENOUS
  Filled 2013-08-28 (×2): qty 1

## 2013-08-28 MED ORDER — LABETALOL HCL 5 MG/ML IV SOLN
INTRAVENOUS | Status: AC
  Filled 2013-08-28: qty 4

## 2013-08-28 MED ORDER — POTASSIUM CHLORIDE 10 MEQ/100ML IV SOLN
10.0000 meq | INTRAVENOUS | Status: AC
  Administered 2013-08-28 (×2): 10 meq via INTRAVENOUS
  Filled 2013-08-28: qty 100

## 2013-08-28 MED ORDER — INSULIN ASPART 100 UNIT/ML ~~LOC~~ SOLN: 0.0000 [IU] | SUBCUTANEOUS | Status: DC

## 2013-08-28 MED ORDER — METOCLOPRAMIDE HCL 10 MG PO TABS
10.0000 mg | ORAL_TABLET | Freq: Four times a day (QID) | ORAL | Status: DC
  Administered 2013-08-28 – 2013-08-31 (×12): 10 mg via ORAL
  Filled 2013-08-28 (×16): qty 1

## 2013-08-28 MED ORDER — SIMVASTATIN 40 MG PO TABS
40.0000 mg | ORAL_TABLET | Freq: Every day | ORAL | Status: DC
  Administered 2013-08-28 – 2013-08-30 (×3): 40 mg via ORAL
  Filled 2013-08-28 (×5): qty 1

## 2013-08-28 MED ORDER — ENOXAPARIN SODIUM 40 MG/0.4ML ~~LOC~~ SOLN
40.0000 mg | SUBCUTANEOUS | Status: DC
  Administered 2013-08-28 – 2013-08-31 (×4): 40 mg via SUBCUTANEOUS
  Filled 2013-08-28 (×6): qty 0.4

## 2013-08-28 MED ORDER — ALBUTEROL SULFATE (2.5 MG/3ML) 0.083% IN NEBU
2.5000 mg | INHALATION_SOLUTION | RESPIRATORY_TRACT | Status: DC | PRN
  Filled 2013-08-28: qty 3

## 2013-08-28 MED ORDER — SIMVASTATIN 10 MG PO TABS: 20.0000 mg | ORAL_TABLET | Freq: Every day | ORAL | Status: DC

## 2013-08-28 MED ORDER — SULFASALAZINE 500 MG PO TABS
500.0000 mg | ORAL_TABLET | Freq: Four times a day (QID) | ORAL | Status: DC
  Administered 2013-08-28 – 2013-08-31 (×12): 500 mg via ORAL
  Filled 2013-08-28 (×20): qty 1

## 2013-08-28 MED ORDER — HYDRALAZINE HCL 25 MG PO TABS
25.0000 mg | ORAL_TABLET | Freq: Two times a day (BID) | ORAL | Status: DC
  Administered 2013-08-28: 25 mg via ORAL
  Filled 2013-08-28: qty 1

## 2013-08-28 NOTE — H&P (Signed)
[facility] History and Physical  Via Rosado JKK:[id number] DOB: 06-26-1940 DOA: 08/27/2013  Referring physician: ER physician at [facility]. PCP: Renato Shin, MD   Chief Complaint: Elevated blood sugar and blood pressure.  HPI: Michelle Farrell is a 73 y.o. female with history of diabetes mellitus, hypertension, chronic kidney disease, chronic back pain, CHF, Crohn's disease was found to have elevated blood pressure by patient's neurologist on Monday and was advised to followup with patient's PCP which patient did yesterday. At patient's PCPs office patient had some blood test which showed elevated blood sugar and was advised to come to the ER. Patient also was prescribed antihypertensives during her visit. In the ER patient was found to have elevated blood sugar with anion gap. Patient also was found to have systolic blood pressure more than 200 and was given one dose of IV labetalol after which patient's blood pressure improved to systolic 716. For patient's DKA patient was started on IV insulin infusion. Patient states she has not missed her Lantus dose. Patient used to be on ARB which was discontinued 2 months ago due to worsening renal function. Patient otherwise denies any chest pain nausea vomiting abdominal pain diarrhea fever chills dysuria. She has mild shortness of breath chronically. Patient did receive some steroid shots in her knees and hip last month. On exam patient is wheezing.   Review of Systems: As presented in the history of presenting illness, rest negative.  Past Medical History  Diagnosis Date  . CHF (congestive heart failure)   . Hypertension   . Back pain, chronic   . Hip pain, chronic   . GERD (gastroesophageal reflux disease)   . Crohn disease   . Diabetes mellitus without complication    Past Surgical History  Procedure Laterality Date  . Abdominal hysterectomy    . Tonsillectomy    . Cholecystectomy    . Bladder surgery     Social  History:  reports that she has been smoking.  She does not have any smokeless tobacco history on file. She reports that she does not drink alcohol or use illicit drugs. Where does patient live home. Can patient participate in ADLs? Yes.  No Known Allergies  Family History:  Family History  Problem Relation Age of Onset  . Diabetes Mellitus II Other       Prior to Admission medications   Medication Sig Start Date End Date Taking? Authorizing Provider  furosemide (LASIX) 40 MG tablet Take 80 mg by mouth.    Yes Historical Provider, MD  gabapentin (NEURONTIN) 600 MG tablet Take 600 mg by mouth 3 (three) times daily.    Yes Historical Provider, MD  hydrALAZINE (APRESOLINE) 25 MG tablet Take 25 mg by mouth 2 (two) times daily. 08/27/13  Yes Historical Provider, MD  HYDROcodone-acetaminophen (NORCO) 10-325 MG per tablet Take 1 tablet by mouth every 6 (six) hours as needed (for pain.).    Yes Historical Provider, MD  insulin glargine (LANTUS) 100 UNIT/ML injection Inject 80 Units into the skin at bedtime.   Yes Historical Provider, MD  metoCLOPramide (REGLAN) 10 MG tablet Take 10 mg by mouth 4 (four) times daily. 06/29/13  Yes Historical Provider, MD  omeprazole (PRILOSEC) 20 MG capsule Take 20 mg by mouth every morning.    Yes Historical Provider, MD  PRAVASTATIN SODIUM PO Take 40 mg by mouth every evening.    Yes Historical Provider, MD  simvastatin (ZOCOR) 40 MG tablet Take 40 mg by mouth at bedtime.  Yes Historical Provider, MD  sitaGLIPtin (JANUVIA) 100 MG tablet Take 100 mg by mouth every morning.    Yes Historical Provider, MD  spironolactone (ALDACTONE) 25 MG tablet Take 25 mg by mouth.     Yes Historical Provider, MD  sulfaSALAzine (AZULFIDINE) 500 MG tablet Take 500 mg by mouth 4 (four) times daily.   Yes Historical Provider, MD  temazepam (RESTORIL) 15 MG capsule Take 15 mg by mouth at bedtime.    Yes Historical Provider, MD  tiZANidine (ZANAFLEX) 4 MG tablet Take 4 mg by mouth 2 (two)  times daily.    Yes Historical Provider, MD  triamcinolone cream (KENALOG) 0.1 % Apply 1 application topically as needed. Applied to arms & chest for itching 08/27/13  Yes Historical Provider, MD    Physical Exam: Filed Vitals:   08/28/13 0320 08/28/13 0330 08/28/13 0331 08/28/13 0400  BP: 215/59 200/70 206/70 186/51  Pulse: 82 87  93  Temp:      TempSrc:      Resp: 18 18  15   Height:      Weight:      SpO2: 99% 99%  97%     General:  Well-developed well-nourished.  Eyes: Anicteric no pallor.  ENT: No discharge from the ears eyes nose mouth.  Neck: No mass felt.  Cardiovascular: S1-S2 heard.  Respiratory: Bilateral wheezing heard. No crepitations.  Abdomen: Soft nontender bowel sounds present. No guarding or rigidity.  Skin: No rash.  Musculoskeletal: No edema.  Psychiatric: Appears normal.  Neurologic: Alert awake oriented to time place and person. Moves all extremities.  Labs on Admission:  Basic Metabolic Panel:  Recent Labs Lab 08/27/13 2205 08/28/13 0032 08/28/13 0035  NA 135* 143  --   K 5.3 2.9*  --   CL 95* 103  --   CO2 19  --   --   GLUCOSE 621* 260*  --   BUN 18 16  --   CREATININE 1.40* 1.50*  --   CALCIUM 11.0*  --   --   MG  --   --  1.8   Liver Function Tests: No results found for this basename: AST, ALT, ALKPHOS, BILITOT, PROT, ALBUMIN,  in the last 168 hours No results found for this basename: LIPASE, AMYLASE,  in the last 168 hours No results found for this basename: AMMONIA,  in the last 168 hours CBC:  Recent Labs Lab 08/27/13 2205 08/28/13 0032  WBC 9.5  --   NEUTROABS 5.0  --   HGB 14.9 15.0  HCT 41.5 44.0  MCV 88.3  --   PLT 170  --    Cardiac Enzymes: No results found for this basename: CKTOTAL, CKMB, CKMBINDEX, TROPONINI,  in the last 168 hours  BNP (last 3 results)  Recent Labs  08/27/13 2205  PROBNP 263.1*   CBG:  Recent Labs Lab 08/27/13 2115 08/27/13 2310 08/28/13 0033 08/28/13 0248 08/28/13 0357   GLUCAP 595* 498* 332* 359* 382*    Radiological Exams on Admission: Dg Chest 2 View  08/27/2013   CLINICAL DATA:  Hyperglycemia.  EXAM: CHEST  2 VIEW  COMPARISON:  Chest radiograph November 28, 2008.  FINDINGS: Cardiac silhouette is unremarkable, mildly calcified aorta. Similar mild increased lung volumes and interstitial prominence without pleural effusions or focal consolidations. Trachea projects midline and there is no pneumothorax.  Suture anchor right humeral head. Surgical clips in the abdomen may reflect cholecystectomy.  IMPRESSION: COPD without superimposed acute cardiopulmonary process.   Electronically Signed  By: Elon Alas   On: 08/27/2013 22:52     Assessment/Plan Principal Problem:   DKA (diabetic ketoacidoses) Active Problems:   HYPERLIPIDEMIA   CHF   CROHN'S DISEASE   Accelerated hypertension   1. Diabetic ketoacidosis - not sure what precipitated patient's DKA. At this time patient is placed on IV insulin infusion for DKA along with hydration. Since patient has CHF be cautious about hydration. Closely follow metabolic panel and once anion gap is corrected change to long-acting insulin. 2. Accelerated hypertension - I have continued patient on when IV labetalol 10 mg every 2 hourly when necessary for systolic blood pressure more than 180. Once patient is able to eat orally start patient on oral antihypertensives. 3. History of CHF unspecified - presently patient is being hydrated for DKA. Caution about fluid overload. 4. COPD - patient is presently wheezing on exam and I have placed patient on Pulmicort and nebulizer. 5. History of Crohn's disease - continue present medications. 6. Hyperlipidemia - continue present medications. 7. Chronic kidney disease - creatinine appears to be at baseline.    Code Status: Full code.  Family Communication: Family at the bedside.  Disposition Plan: Admit to inpatient.    [facility] Pager  [phone].  If 7PM-7AM, please contact night-coverage [email or web address] Password [facility] 08/28/2013, 4:27 AM

## 2013-08-28 NOTE — Progress Notes (Signed)
[facility]                                                                              Patient Demographics  Michelle Farrell, is a 73 y.o. female, DOB - 1940/11/20, QDI:[id number]  Admit date - 08/27/2013   Admitting Physician Berle Mull, MD  Outpatient Primary MD for the patient is Renato Shin, MD  LOS - 1   Chief Complaint  Patient presents with  . Hyperglycemia      HPI Michelle Farrell is a 73 y.o. female with history of diabetes mellitus, hypertension, chronic kidney disease, chronic back pain, CHF, Crohn's disease was found to have elevated blood pressure by patient's neurologist on Monday and was advised to followup with patient's PCP which patient did on 08/27/13. At patient's PCPs office patient had some lab work which showed elevated blood sugar and was advised to come to the ER. Patient also was prescribed antihypertensives during her visit. In the ER patient was found to have elevated blood sugar with anion gap. Patient also was found to have systolic blood pressure more than 200 and was given one dose of IV labetalol after which patient's blood pressure improved to systolic 407. For patient's DKA, she was started on IV insulin infusion. Patient states she has not missed her Lantus dose. Patient used to be on ARB which was discontinued 2 months ago due to worsening renal function. Patient otherwise denied any chest pain, nausea, vomiting, abdominal pain, diarrhea, fever, chills, dysuria. She has mild shortness of breath chronically. Patient received some steroid injections in her knees and hip last month. On initial exam, patient was wheezing.    Assessment & Plan   Diabetic ketoacidosis/Diabetes Mellitus with neuropathy -Unknown etiology -Patient states she is compliant with her medications at home, Lantus 80 units at night as well as Januvia -Will continue insulin drip as well as IV fluids-however we'll be cautious due to patient's history of CHF  -Will check BMPs  every 2 hours, currently anion gap of 19 -Hemoglobin A1c pending -Continue gabapentin for neuropathy  Accelerated hypertension -Will continue labetalol IV 10 mg every 2 hours as necessary for systolic blood pressures over 180 -Continue hydralazine  History of CHF, unspecified -Will continue to monitor patient's daily weights as well as intake and output -Currently on IV fluids do to DKA -BNP 251  COPD -Currently stable, continue Pulmicort and nebulizer treatments  History of Crohn's disease -Continue sulfasalazine  Hyperlipidemia -Continue simvastatin  Chronic kidney disease, stage III -Creatinine appears to be at baseline -Will continue to monitor  Headaches -Likely secondary to her accelerated hypertension -Will continue PRN pain medications  Code Status: Full  Family Communication: None at bedside  Disposition Plan: Admitted  Time Spent in minutes   30 minutes  Procedures none  Consults  none  DVT Prophylaxis  Lovenox   Lab Results  Component Value Date   PLT 185 08/28/2013    Medications  Scheduled Meds: . albuterol  2.5 mg Nebulization BID  . budesonide (PULMICORT) nebulizer solution  0.25 mg Nebulization BID  . enoxaparin (LOVENOX) injection  40 mg Subcutaneous Q24H  . gabapentin  600 mg Oral TID  . hydrALAZINE  25 mg  Oral BID  . metoCLOPramide  10 mg Oral QID  . pantoprazole  40 mg Oral Daily  . potassium chloride  10 mEq Intravenous Q1H  . potassium chloride  10 mEq Intravenous Q1 Hr x 2  . simvastatin  40 mg Oral QHS  . sulfaSALAzine  500 mg Oral QID   Continuous Infusions: . sodium chloride 75 mL/hr at 08/28/13 0438  . dextrose 5 % and 0.45% NaCl    . insulin (NOVOLIN-R) infusion 6.2 Units/hr (08/28/13 0524)   PRN Meds:.albuterol, dextrose, HYDROcodone-acetaminophen, labetalol, triamcinolone cream  Antibiotics    Anti-infectives   None      Subjective:   Jenita Rayfield seen and examined today.  Patient complains of RUQ cramping  which she states started a few hours ago.  She also complains of frontal headaches.  Her last bowel movement was Monday.  She denies any shortness of breath or chest pain.    Objective:   Filed Vitals:   08/28/13 0430 08/28/13 0500 08/28/13 0530 08/28/13 0600  BP: 184/47 198/53 196/48 150/39  Pulse: 73 76 79 68  Temp:      TempSrc:      Resp: 14 18 17 18   Height:      Weight:      SpO2: 97% 97% 97% 96%    Wt Readings from Last 3 Encounters:  08/28/13 84.3 kg (185 lb 13.6 oz)  07/14/13 90.266 kg (199 lb)  11/09/09 99.701 kg (219 lb 12.8 oz)     Intake/Output Summary (Last 24 hours) at 08/28/13 0745 Last data filed at 08/28/13 0737  Gross per 24 hour  Intake 317.78 ml  Output    325 ml  Net  -7.22 ml    Exam  General: Well developed, well nourished, NAD, appears stated age  HEENT: NCAT, PERRLA, EOMI, Anicteic Sclera, currently receiving breathing treatment  Neck: Supple, no JVD, no masses  Cardiovascular: S1 S2 auscultated, no rubs, murmurs or gallops. Regular rate and rhythm.  Respiratory: Clear to auscultation bilaterally with equal chest rise  Abdomen: Soft, nontender, nondistended, + bowel sounds  Extremities: warm dry without cyanosis clubbing or edema  Neuro: AAOx3, cranial nerves grossly intact. Strength 5/5 in patient's upper and lower extremities bilaterally  Skin: Without rashes exudates or nodules  Psych: Normal affect and demeanor with intact judgement and insight  Data Review   Micro Results Recent Results (from the past 240 hour(s))  MRSA PCR SCREENING     Status: Abnormal   Collection Time    08/28/13  2:48 AM      Result Value Ref Range Status   MRSA by PCR INVALID RESULTS, SPECIMEN SENT FOR CULTURE (*) NEGATIVE Final   Comment: RESULT CALLED TO, READ BACK BY AND VERIFIED WITH:     BANKS,T RN AT 0508 06.03.15                The GeneXpert MRSA Assay (FDA     approved for NASAL specimens     only), is one component of a     comprehensive  MRSA colonization     surveillance program. It is not     intended to diagnose MRSA     infection nor to guide or     monitor treatment for     MRSA infections.    Radiology Reports Dg Chest 2 View  08/27/2013   CLINICAL DATA:  Hyperglycemia.  EXAM: CHEST  2 VIEW  COMPARISON:  Chest radiograph November 28, 2008.  FINDINGS: Cardiac silhouette  is unremarkable, mildly calcified aorta. Similar mild increased lung volumes and interstitial prominence without pleural effusions or focal consolidations. Trachea projects midline and there is no pneumothorax.  Suture anchor right humeral head. Surgical clips in the abdomen may reflect cholecystectomy.  IMPRESSION: COPD without superimposed acute cardiopulmonary process.   Electronically Signed   By: Elon Alas   On: 08/27/2013 22:52    CBC  Recent Labs Lab 08/27/13 2205 08/28/13 0032 08/28/13 0454  WBC 9.5  --  11.4*  HGB 14.9 15.0 13.3  HCT 41.5 44.0 38.4  PLT 170  --  185  MCV 88.3  --  88.7  MCH 31.7  --  30.7  MCHC 35.9  --  34.6  RDW 14.6  --  15.1  LYMPHSABS 2.9  --  3.3  MONOABS 1.4*  --  1.3*  EOSABS 0.2  --  0.2  BASOSABS 0.0  --  0.0    Chemistries   Recent Labs Lab 08/27/13 2205 08/28/13 0032 08/28/13 0035 08/28/13 0454  NA 135* 143  --  138  K 5.3 2.9*  --  3.7  CL 95* 103  --  100  CO2 19  --   --  19  GLUCOSE 621* 260*  --  384*  BUN 18 16  --  17  CREATININE 1.40* 1.50*  --  1.26*  CALCIUM 11.0*  --   --  10.4  MG  --   --  1.8  --    ------------------------------------------------------------------------------------------------------------------ estimated creatinine clearance is 44.8 ml/min (by C-G formula based on Cr of 1.26). ------------------------------------------------------------------------------------------------------------------ No results found for this basename: HGBA1C,  in the last 72  hours ------------------------------------------------------------------------------------------------------------------ No results found for this basename: CHOL, HDL, LDLCALC, TRIG, CHOLHDL, LDLDIRECT,  in the last 72 hours ------------------------------------------------------------------------------------------------------------------  Recent Labs  08/28/13 0454  TSH 1.160   ------------------------------------------------------------------------------------------------------------------ No results found for this basename: VITAMINB12, FOLATE, FERRITIN, TIBC, IRON, RETICCTPCT,  in the last 72 hours  Coagulation profile No results found for this basename: INR, PROTIME,  in the last 168 hours  No results found for this basename: DDIMER,  in the last 72 hours  Cardiac Enzymes  Recent Labs Lab 08/28/13 0454  TROPONINI <0.30   ------------------------------------------------------------------------------------------------------------------ No components found with this basename: POCBNP,     Coben Godshall D.O. on 08/28/2013 at 7:45 AM  Between 7am to 7pm - Pager - [phone]  After 7pm go to [email or web address] - password [email or web address]  And look for the night coverage person covering for me after hours  [facility] Office  [phone]

## 2013-08-28 NOTE — Progress Notes (Signed)
Moffat, RN, BSN, CCM  [phone]  Chart Reviewed for discharge and hospital needs.  Discharge needs at time of review: None present will follow for needs.  Review of patient progress due on [id number].

## 2013-08-29 LAB — CBC
HCT: 37.3 % (ref 36.0–46.0)
Hemoglobin: 12.9 g/dL (ref 12.0–15.0)
MCH: 30.6 pg (ref 26.0–34.0)
MCHC: 34.6 g/dL (ref 30.0–36.0)
MCV: 88.4 fL (ref 78.0–100.0)
PLATELETS: 176 10*3/uL (ref 150–400)
RBC: 4.22 MIL/uL (ref 3.87–5.11)
RDW: 15.5 % (ref 11.5–15.5)
WBC: 11.5 10*3/uL — AB (ref 4.0–10.5)

## 2013-08-29 LAB — GLUCOSE, CAPILLARY
GLUCOSE-CAPILLARY: 239 mg/dL — AB (ref 70–99)
GLUCOSE-CAPILLARY: 166 mg/dL — AB (ref 70–99)
GLUCOSE-CAPILLARY: 174 mg/dL — AB (ref 70–99)
GLUCOSE-CAPILLARY: 172 mg/dL — AB (ref 70–99)
GLUCOSE-CAPILLARY: 125 mg/dL — AB (ref 70–99)
GLUCOSE-CAPILLARY: 180 mg/dL — AB (ref 70–99)
Glucose-Capillary: 113 mg/dL — ABNORMAL HIGH (ref 70–99)
Glucose-Capillary: 132 mg/dL — ABNORMAL HIGH (ref 70–99)
Glucose-Capillary: 136 mg/dL — ABNORMAL HIGH (ref 70–99)
Glucose-Capillary: 138 mg/dL — ABNORMAL HIGH (ref 70–99)
Glucose-Capillary: 139 mg/dL — ABNORMAL HIGH (ref 70–99)
Glucose-Capillary: 154 mg/dL — ABNORMAL HIGH (ref 70–99)
Glucose-Capillary: 160 mg/dL — ABNORMAL HIGH (ref 70–99)
Glucose-Capillary: 160 mg/dL — ABNORMAL HIGH (ref 70–99)
Glucose-Capillary: 174 mg/dL — ABNORMAL HIGH (ref 70–99)
Glucose-Capillary: 194 mg/dL — ABNORMAL HIGH (ref 70–99)
Glucose-Capillary: 221 mg/dL — ABNORMAL HIGH (ref 70–99)
Glucose-Capillary: 261 mg/dL — ABNORMAL HIGH (ref 70–99)
Glucose-Capillary: 263 mg/dL — ABNORMAL HIGH (ref 70–99)

## 2013-08-29 LAB — BASIC METABOLIC PANEL
BUN: 7 mg/dL (ref 6–23)
BUN: 6 mg/dL (ref 6–23)
CHLORIDE: 107 meq/L (ref 96–112)
CO2: 18 mEq/L — ABNORMAL LOW (ref 19–32)
CO2: 18 mEq/L — ABNORMAL LOW (ref 19–32)
CREATININE: 0.93 mg/dL (ref 0.50–1.10)
Calcium: 10 mg/dL (ref 8.4–10.5)
Calcium: 10.2 mg/dL (ref 8.4–10.5)
Chloride: 110 mEq/L (ref 96–112)
Creatinine, Ser: 0.95 mg/dL (ref 0.50–1.10)
GFR calc non Af Amer: 59 mL/min — ABNORMAL LOW (ref >90)
GFR, EST AFRICAN AMERICAN: 70 mL/min — AB (ref >90)
GFR, EST AFRICAN AMERICAN: 68 mL/min — AB (ref >90)
GFR, EST NON AFRICAN AMERICAN: 60 mL/min — AB (ref >90)
Glucose, Bld: 168 mg/dL — ABNORMAL HIGH (ref 70–99)
Glucose, Bld: 175 mg/dL — ABNORMAL HIGH (ref 70–99)
POTASSIUM: 3.2 meq/L — AB (ref 3.7–5.3)
POTASSIUM: 4.3 meq/L (ref 3.7–5.3)
SODIUM: 141 meq/L (ref 137–147)
Sodium: 140 mEq/L (ref 137–147)

## 2013-08-29 MED ORDER — MORPHINE SULFATE 2 MG/ML IJ SOLN
2.0000 mg | INTRAMUSCULAR | Status: DC | PRN
  Administered 2013-08-29 – 2013-08-30 (×2): 2 mg via INTRAVENOUS
  Filled 2013-08-29 (×2): qty 1

## 2013-08-29 MED ORDER — HYDRALAZINE HCL 50 MG PO TABS
50.0000 mg | ORAL_TABLET | Freq: Three times a day (TID) | ORAL | Status: DC
  Administered 2013-08-29 – 2013-08-31 (×6): 50 mg via ORAL
  Filled 2013-08-29 (×12): qty 1

## 2013-08-29 MED ORDER — CLONIDINE HCL 0.1 MG PO TABS
0.1000 mg | ORAL_TABLET | Freq: Two times a day (BID) | ORAL | Status: DC
  Administered 2013-08-29 – 2013-08-31 (×5): 0.1 mg via ORAL
  Filled 2013-08-29 (×8): qty 1

## 2013-08-29 MED ORDER — POTASSIUM CHLORIDE 20 MEQ/15ML (10%) PO LIQD
40.0000 meq | Freq: Once | ORAL | Status: AC
  Administered 2013-08-29: 40 meq via ORAL
  Filled 2013-08-29: qty 30

## 2013-08-29 MED ORDER — INSULIN ASPART 100 UNIT/ML ~~LOC~~ SOLN
0.0000 [IU] | SUBCUTANEOUS | Status: DC
  Administered 2013-08-29 (×2): 8 [IU] via SUBCUTANEOUS
  Administered 2013-08-30: 5 [IU] via SUBCUTANEOUS
  Administered 2013-08-30: 3 [IU] via SUBCUTANEOUS
  Administered 2013-08-30: 2 [IU] via SUBCUTANEOUS
  Administered 2013-08-30: 8 [IU] via SUBCUTANEOUS
  Administered 2013-08-30: 3 [IU] via SUBCUTANEOUS
  Administered 2013-08-31: 8 [IU] via SUBCUTANEOUS
  Administered 2013-08-31: 3 [IU] via SUBCUTANEOUS
  Administered 2013-08-31 (×2): 2 [IU] via SUBCUTANEOUS

## 2013-08-29 MED ORDER — SODIUM CHLORIDE 0.9 % IV SOLN
INTRAVENOUS | Status: DC
  Administered 2013-08-29: 22:00:00 via INTRAVENOUS

## 2013-08-29 MED ORDER — SODIUM CHLORIDE 0.9 % IV SOLN: INTRAVENOUS | Status: AC

## 2013-08-29 MED ORDER — INSULIN GLARGINE 100 UNIT/ML ~~LOC~~ SOLN
50.0000 [IU] | Freq: Every day | SUBCUTANEOUS | Status: DC
  Administered 2013-08-29 – 2013-08-31 (×3): 50 [IU] via SUBCUTANEOUS
  Filled 2013-08-29 (×3): qty 0.5

## 2013-08-29 MED ORDER — POTASSIUM CHLORIDE 20 MEQ/15ML (10%) PO LIQD: 40.0000 meq | Freq: Once | ORAL | Status: DC

## 2013-08-29 NOTE — Progress Notes (Signed)
[facility]                                                                              Patient Demographics  Michelle Farrell, is a 73 y.o. female, DOB - 12-12-1940, JEH:[id number]  Admit date - 08/27/2013   Admitting Physician Berle Mull, MD  Outpatient Primary MD for the patient is Renato Shin, MD  LOS - 2   Chief Complaint  Patient presents with  . Hyperglycemia      HPI Michelle Farrell is a 73 y.o. female with history of diabetes mellitus, hypertension, chronic kidney disease, chronic back pain, CHF, Crohn's disease was found to have elevated blood pressure by patient's neurologist on Monday and was advised to followup with patient's PCP which patient did on 08/27/13. At patient's PCPs office patient had some lab work which showed elevated blood sugar and was advised to come to the ER. Patient also was prescribed antihypertensives during her visit. In the ER patient was found to have elevated blood sugar with anion gap. Patient also was found to have systolic blood pressure more than 200 and was given one dose of IV labetalol after which patient's blood pressure improved to systolic 378. For patient's DKA, she was started on IV insulin infusion. Patient states she has not missed her Lantus dose. Patient used to be on ARB which was discontinued 2 months ago due to worsening renal function. Patient otherwise denied any chest pain, nausea, vomiting, abdominal pain, diarrhea, fever, chills, dysuria. She has mild shortness of breath chronically. Patient received some steroid injections in her knees and hip last month. On initial exam, patient was wheezing.    Assessment & Plan   Diabetic ketoacidosis/Diabetes Mellitus with neuropathy -Unknown etiology -Patient states she is compliant with her medications at home, Lantus 80 units at night as well as Januvia -Will check BMPs every 2 hours, currently anion gap of 15 -Hemoglobin A1c 9.1 -Blood sugars are more controlled, will change  patient over to lantus and ISS.  Will start with Lantus 50units and moderate sliding scale (will bridge for 2 hours with insulin drip) -Will advance patient's diet to carb/heart healthy -Will also consult diabetes coordinator  -Continue gabapentin for neuropathy  Accelerated hypertension -Will continue labetalol IV 10 mg every 2 hours as necessary for systolic blood pressures over 180 -Continue hydralazine, however increased to 76m TID  History of CHF, unspecified -Will continue to monitor patient's daily weights as well as intake and output -Currently on IV fluids do to DKA -BNP 251  COPD -Currently stable, continue Pulmicort and nebulizer treatments  History of Crohn's disease -Continue sulfasalazine  Hyperlipidemia -Continue simvastatin  Chronic kidney disease, stage III -Creatinine appears to be at baseline -Will continue to monitor  Headaches -Likely secondary to her accelerated hypertension -Will continue PRN pain medications  Code Status: Full  Family Communication: None at bedside  Disposition Plan: Admitted  Time Spent in minutes   30 minutes  Procedures none  Consults  none  DVT Prophylaxis  Lovenox   Lab Results  Component Value Date   PLT 176 08/29/2013    Medications  Scheduled Meds: . albuterol  2.5 mg Nebulization BID  . budesonide (PULMICORT)  nebulizer solution  0.25 mg Nebulization BID  . enoxaparin (LOVENOX) injection  40 mg Subcutaneous Q24H  . gabapentin  600 mg Oral TID  . hydrALAZINE  50 mg Oral BID  . metoCLOPramide  10 mg Oral QID  . pantoprazole  40 mg Oral Daily  . potassium chloride  40 mEq Oral Once  . simvastatin  40 mg Oral QHS  . sulfaSALAzine  500 mg Oral QID   Continuous Infusions: . sodium chloride Stopped (08/28/13 1400)  . dextrose 5 % and 0.45% NaCl 75 mL/hr at 08/29/13 0414  . insulin (NOVOLIN-R) infusion 4 mL/hr at 08/29/13 0656   PRN Meds:.albuterol, dextrose, hydrALAZINE, HYDROcodone-acetaminophen, labetalol,  temazepam, triamcinolone cream  Antibiotics    Anti-infectives   None      Subjective:   [name] seen and examined today.  Patient complains of feeling tired but wishes to eat.  She denies any nausea/vomiting.  Headaches have improved.  Denies chest pain or shortness of breath.   Objective:   Filed Vitals:   08/29/13 0302 08/29/13 0400 08/29/13 0500 08/29/13 0600  BP:  156/50 171/50 188/54  Pulse:  75 78 82  Temp:  99 F (37.2 C)    TempSrc:  Oral    Resp:  25 25 23   Height:      Weight: 86.9 kg (191 lb 9.3 oz)     SpO2:  96% 96% 97%    Wt Readings from Last 3 Encounters:  08/29/13 86.9 kg (191 lb 9.3 oz)  07/14/13 90.266 kg (199 lb)  11/09/09 99.701 kg (219 lb 12.8 oz)     Intake/Output Summary (Last 24 hours) at 08/29/13 0731 Last data filed at 08/29/13 0656  Gross per 24 hour  Intake 1842.86 ml  Output   1551 ml  Net 291.86 ml    Exam  General: Well developed, well nourished, NAD, appears stated age  HEENT: NCAT, PERRLA, EOMI, Anicteic Sclera, mucous membranes moist  Neck: Supple, no JVD, no masses  Cardiovascular: S1 S2 auscultated, no rubs, murmurs or gallops. Regular rate and rhythm.  Respiratory: Clear to auscultation bilaterally with equal chest rise  Abdomen: Soft, nontender, nondistended, + bowel sounds  Extremities: warm dry without cyanosis clubbing or edema  Neuro: AAOx3, no focal deficits  Skin: Without rashes exudates or nodules  Psych: Normal affect and demeanor with intact judgement and insight  Data Review   Micro Results Recent Results (from the past 240 hour(s))  MRSA PCR SCREENING     Status: Abnormal   Collection Time    08/28/13  2:48 AM      Result Value Ref Range Status   MRSA by PCR INVALID RESULTS, SPECIMEN SENT FOR CULTURE (*) NEGATIVE Final   Comment: RESULT CALLED TO, READ BACK BY AND VERIFIED WITH:     BANKS,T RN AT 0508 06.03.15                The GeneXpert MRSA Assay (FDA     approved for NASAL  specimens     only), is one component of a     comprehensive MRSA colonization     surveillance program. It is not     intended to diagnose MRSA     infection nor to guide or     monitor treatment for     MRSA infections.    Radiology Reports Dg Chest 2 View  08/27/2013   CLINICAL DATA:  Hyperglycemia.  EXAM: CHEST  2 VIEW  COMPARISON:  Chest radiograph November 28, 2008.  FINDINGS: Cardiac silhouette is unremarkable, mildly calcified aorta. Similar mild increased lung volumes and interstitial prominence without pleural effusions or focal consolidations. Trachea projects midline and there is no pneumothorax.  Suture anchor right humeral head. Surgical clips in the abdomen may reflect cholecystectomy.  IMPRESSION: COPD without superimposed acute cardiopulmonary process.   Electronically Signed   By: Elon Alas   On: 08/27/2013 22:52    CBC  Recent Labs Lab 08/27/13 2205 08/28/13 0032 08/28/13 0454 08/29/13 0310  WBC 9.5  --  11.4* 11.5*  HGB 14.9 15.0 13.3 12.9  HCT 41.5 44.0 38.4 37.3  PLT 170  --  185 176  MCV 88.3  --  88.7 88.4  MCH 31.7  --  30.7 30.6  MCHC 35.9  --  34.6 34.6  RDW 14.6  --  15.1 15.5  LYMPHSABS 2.9  --  3.3  --   MONOABS 1.4*  --  1.3*  --   EOSABS 0.2  --  0.2  --   BASOSABS 0.0  --  0.0  --     Chemistries   Recent Labs Lab 08/28/13 0032 08/28/13 0035  08/28/13 1707 08/28/13 1904 08/28/13 2020 08/28/13 2240 08/29/13 0310  NA 143  --   < > 142 140 139 140 140  K 2.9*  --   < > 3.9 3.8 4.0 3.7 3.2*  CL 103  --   < > 108 107 104 107 107  CO2  --   --   < > 18* 17* 18* 17* 18*  GLUCOSE 260*  --   < > 179* 244* 296* 168* 168*  BUN 16  --   < > 10 10 9 8 7   CREATININE 1.50*  --   < > 0.96 0.97 0.97 0.95 0.93  CALCIUM  --   --   < > 10.2 10.2 10.0 10.3 10.0  MG  --  1.8  --   --   --   --   --   --   < > = values in this interval not  displayed. ------------------------------------------------------------------------------------------------------------------ estimated creatinine clearance is 61.6 ml/min (by C-G formula based on Cr of 0.93). ------------------------------------------------------------------------------------------------------------------  Recent Labs  08/28/13 0454  HGBA1C 9.1*   ------------------------------------------------------------------------------------------------------------------ No results found for this basename: CHOL, HDL, LDLCALC, TRIG, CHOLHDL, LDLDIRECT,  in the last 72 hours ------------------------------------------------------------------------------------------------------------------  Recent Labs  08/28/13 0454  TSH 1.160   ------------------------------------------------------------------------------------------------------------------ No results found for this basename: VITAMINB12, FOLATE, FERRITIN, TIBC, IRON, RETICCTPCT,  in the last 72 hours  Coagulation profile No results found for this basename: INR, PROTIME,  in the last 168 hours  No results found for this basename: DDIMER,  in the last 72 hours  Cardiac Enzymes  Recent Labs Lab 08/28/13 0454  TROPONINI <0.30   ------------------------------------------------------------------------------------------------------------------ No components found with this basename: POCBNP,     Genia Perin D.O. on 08/29/2013 at 7:31 AM  Between 7am to 7pm - Pager - [phone]  After 7pm go to [email or web address] - password [email or web address]  And look for the night coverage person covering for me after hours  [facility] Office  [phone]

## 2013-08-29 NOTE — Progress Notes (Signed)
Inpatient Diabetes Program Recommendations  AACE/ADA: New Consensus Statement on Inpatient Glycemic Control (2013)  Target Ranges:  Prepandial:   less than 140 mg/dL      Peak postprandial:   less than 180 mg/dL (1-2 hours)      Critically ill patients:  140 - 180 mg/dL   Reason for Visit: Diabetes Consult Diabetes history: DM2 Outpatient Diabetes medications: Lantus 80 units QHS and Januvia 100 mg QD Current orders for Inpatient glycemic control: Lantus 50 units QAM, Novolog moderate Q4H Transitioning off GlucoStabilizer at present.  Results for DINEEN, CONRADT (MRN [id number]) as of 08/29/2013 10:00  Ref. Range 08/29/2013 04:53 08/29/2013 06:01 08/29/2013 06:53 08/29/2013 07:58 08/29/2013 09:35  Glucose-Capillary Latest Range: 70-99 mg/dL 172 (H) 194 (H) 160 (H) 174 (H) 139 (H)  Results for KEALEY, KEMMER (MRN [id number]) as of 08/29/2013 10:00  Ref. Range 08/28/2013 04:54  Hemoglobin A1C Latest Range: <5.7 % 9.1 (H)  Results for MACKENIZE, DELGADILLO (MRN [id number]) as of 08/29/2013 10:00  Ref. Range 08/28/2013 19:04 08/28/2013 20:20 08/28/2013 22:40 08/29/2013 03:10 08/29/2013 08:17  Glucose Latest Range: 70-99 mg/dL 244 (H) 296 (H) 168 (H) 168 (H) 175 (H)   AG - 13, CO2 - 18. Pt states she is hungry. When questioned about taking insulin at home, pt states her mother had recently passed away and she was using her Lantus pens - injecting 8 units QHS instead of 80 units QHS. Had run out of strips for glucose meter. Has since then ordered same. Discussed HgbA1C results and importance of achieving tighter control to reduce risks of complications. Denies any hypoglycemia at home.  Willing to use a rapid-acting insulin at home if needed. Tries to follow healthy diet at home. Very little exercise with back and leg problems. "I'm supposed to get an MRI soon."  DKA resolved. Recommendations: Lantus 50 units Q24H Novolog moderate tidwc and hs (if eating) or Q4H if NPO or poor intake Add Novolog 6 units tidwc for meal  coverage insulin if pt eats >50% meal.  Will continue to follow while inpatient. Thank you. Lorenda Peck, RD, LDN, CDE Inpatient Diabetes Coordinator [phone]

## 2013-08-30 LAB — GLUCOSE, CAPILLARY
GLUCOSE-CAPILLARY: 117 mg/dL — AB (ref 70–99)
GLUCOSE-CAPILLARY: 134 mg/dL — AB (ref 70–99)
GLUCOSE-CAPILLARY: 244 mg/dL — AB (ref 70–99)
GLUCOSE-CAPILLARY: 254 mg/dL — AB (ref 70–99)
Glucose-Capillary: 194 mg/dL — ABNORMAL HIGH (ref 70–99)
Glucose-Capillary: 132 mg/dL — ABNORMAL HIGH (ref 70–99)

## 2013-08-30 LAB — MRSA CULTURE

## 2013-08-30 LAB — BASIC METABOLIC PANEL
BUN: 6 mg/dL (ref 6–23)
CHLORIDE: 110 meq/L (ref 96–112)
CO2: 19 mEq/L (ref 19–32)
CREATININE: 0.99 mg/dL (ref 0.50–1.10)
Calcium: 10 mg/dL (ref 8.4–10.5)
GFR calc Af Amer: 65 mL/min — ABNORMAL LOW (ref >90)
GFR calc non Af Amer: 56 mL/min — ABNORMAL LOW (ref >90)
Glucose, Bld: 129 mg/dL — ABNORMAL HIGH (ref 70–99)
Potassium: 3.8 mEq/L (ref 3.7–5.3)
Sodium: 143 mEq/L (ref 137–147)

## 2013-08-30 LAB — CBC
HEMATOCRIT: 34.3 % — AB (ref 36.0–46.0)
Hemoglobin: 12 g/dL (ref 12.0–15.0)
MCH: 30.7 pg (ref 26.0–34.0)
MCHC: 35 g/dL (ref 30.0–36.0)
MCV: 87.7 fL (ref 78.0–100.0)
PLATELETS: 171 10*3/uL (ref 150–400)
RBC: 3.91 MIL/uL (ref 3.87–5.11)
RDW: 15.6 % — ABNORMAL HIGH (ref 11.5–15.5)
WBC: 10.8 10*3/uL — AB (ref 4.0–10.5)

## 2013-08-30 MED ORDER — INSULIN ASPART 100 UNIT/ML ~~LOC~~ SOLN
6.0000 [IU] | Freq: Three times a day (TID) | SUBCUTANEOUS | Status: DC
  Administered 2013-08-30 – 2013-08-31 (×5): 6 [IU] via SUBCUTANEOUS

## 2013-08-30 NOTE — Progress Notes (Signed)
Pt to be transferred to 1320.  Report called to Legrand Rams, RN on [place].  Pt updated.  Garry Heater, RN 08/30/2013

## 2013-08-30 NOTE — Progress Notes (Signed)
[facility]                                                                              Patient Demographics  Michelle Farrell, is a 73 y.o. female, DOB - [birth year], ERD:[id number]  Admit date - 08/27/2013   Admitting Physician Berle Mull, MD  Outpatient Primary MD for the patient is Renato Shin, MD  LOS - 3   Chief Complaint  Patient presents with  . Hyperglycemia      HPI Michelle Farrell is a 73 y.o. female with history of diabetes mellitus, hypertension, chronic kidney disease, chronic back pain, CHF, Crohn's disease was found to have elevated blood pressure by patient's neurologist on Monday and was advised to followup with patient's PCP which patient did on 08/27/13. At patient's PCPs office patient had some lab work which showed elevated blood sugar and was advised to come to the ER. Patient also was prescribed antihypertensives during her visit. In the ER patient was found to have elevated blood sugar with anion gap. Patient also was found to have systolic blood pressure more than 200 and was given one dose of IV labetalol after which patient's blood pressure improved to systolic 563. For patient's DKA, she was started on IV insulin infusion. Patient states she has not missed her Lantus dose. Patient used to be on ARB which was discontinued 2 months ago due to worsening renal function. Patient otherwise denied any chest pain, nausea, vomiting, abdominal pain, diarrhea, fever, chills, dysuria. She has mild shortness of breath chronically. Patient received some steroid injections in her knees and hip last month. On initial exam, patient was wheezing.    Assessment & Plan   Diabetic ketoacidosis/Diabetes Mellitus with neuropathy -Patient states she is compliant with her medications at home, Lantus 80 units at night as well as Januvia, however patient was taking only 8 units of Lantus accidentally. -Gap closed -Hemoglobin A1c 9.1 -Blood sugars are more controlled -Continue  Lantus 50 units daily, moderate ISS, and novolog 6 units TID with meals -Patient's diet advanced, was able to tolerate well. -Diabetes coordinator consulted and following -Continue gabapentin for neuropathy  Accelerated hypertension -Under better control -Continue hydralazine and clonidine as well as PRN labetalol and hydralazine  History of CHF, unspecified -Will continue to monitor patient's daily weights as well as intake and output -Currently on IV fluids do to DKA -BNP 251  COPD -Currently stable, continue Pulmicort and nebulizer treatments  History of Crohn's disease -Continue sulfasalazine  Hyperlipidemia -Continue simvastatin  Chronic kidney disease, stage III -Creatinine appears to be at baseline -Will continue to monitor  Headaches -Likely secondary to her accelerated hypertension -Will continue PRN pain medications  Code Status: Full  Family Communication: None at bedside  Disposition Plan: Admitted, will transfer to medical floor  Time Spent in minutes   30 minutes  Procedures none  Consults  none  DVT Prophylaxis  Lovenox   Lab Results  Component Value Date   PLT 171 08/30/2013    Medications  Scheduled Meds: . albuterol  2.5 mg Nebulization BID  . budesonide (PULMICORT) nebulizer solution  0.25 mg Nebulization BID  . cloNIDine  0.1 mg Oral BID  .  enoxaparin (LOVENOX) injection  40 mg Subcutaneous Q24H  . gabapentin  600 mg Oral TID  . hydrALAZINE  50 mg Oral 3 times per day  . insulin aspart  0-15 Units Subcutaneous 6 times per day  . insulin aspart  6 Units Subcutaneous TID WC  . insulin glargine  50 Units Subcutaneous Daily  . metoCLOPramide  10 mg Oral QID  . pantoprazole  40 mg Oral Daily  . simvastatin  40 mg Oral QHS  . sulfaSALAzine  500 mg Oral QID   Continuous Infusions:   PRN Meds:.albuterol, dextrose, hydrALAZINE, HYDROcodone-acetaminophen, labetalol, morphine injection, temazepam, triamcinolone cream  Antibiotics     Anti-infectives   None      Subjective:   Michelle Farrell seen and examined today.  Patient complains of chronic back pain.  She no longer has a headache.  She denies nausea or vomiting.  She was able to tolerate a diet yesterday and is hungry this morning.  Patient admits to taking the wrong dose of her Lantus, but accidentally.    Objective:   Filed Vitals:   08/30/13 0100 08/30/13 0200 08/30/13 0400 08/30/13 0600  BP:  126/42 143/46 163/53  Pulse: 73 73 68 73  Temp:   98.9 F (37.2 C)   TempSrc:   Oral   Resp: 25 24 23 23   Height:      Weight:      SpO2: 98% 95% 97% 97%    Wt Readings from Last 3 Encounters:  08/29/13 86.9 kg (191 lb 9.3 oz)  07/14/13 90.266 kg (199 lb)  11/09/09 99.701 kg (219 lb 12.8 oz)     Intake/Output Summary (Last 24 hours) at 08/30/13 0724 Last data filed at 08/30/13 0700  Gross per 24 hour  Intake 1575.85 ml  Output    600 ml  Net 975.85 ml    Exam  General: Well developed, well nourished, NAD, appears stated age  HEENT: NCAT, mucous membranes moist  Neck: Supple, no JVD, no masses  Cardiovascular: S1 S2 auscultated, 2/6 SEM. Regular rate and rhythm.  Respiratory: Clear to auscultation bilaterally with equal chest rise  Abdomen: Soft, nontender, nondistended, + bowel sounds  Extremities: warm dry without cyanosis clubbing or edema  Neuro: AAOx3, no focal deficits  Skin: Without rashes exudates or nodules  Psych: Normal affect and demeanor with intact judgement and insight  Data Review   Micro Results Recent Results (from the past 240 hour(s))  MRSA PCR SCREENING     Status: Abnormal   Collection Time    08/28/13  2:48 AM      Result Value Ref Range Status   MRSA by PCR INVALID RESULTS, SPECIMEN SENT FOR CULTURE (*) NEGATIVE Final   Comment: RESULT CALLED TO, READ BACK BY AND VERIFIED WITH:     BANKS,T RN AT 0508 06.03.15                The GeneXpert MRSA Assay (FDA     approved for NASAL specimens     only), is  one component of a     comprehensive MRSA colonization     surveillance program. It is not     intended to diagnose MRSA     infection nor to guide or     monitor treatment for     MRSA infections.  MRSA CULTURE     Status: None   Collection Time    08/28/13  2:48 AM      Result Value Ref Range Status  Specimen Description NOSE   Final   Special Requests NONE   Final   Culture     Final   Value: NO SUSPICIOUS COLONIES, CONTINUING TO HOLD     Performed at [facility]   Report Status PENDING   Incomplete    Radiology Reports Dg Chest 2 View  08/27/2013   CLINICAL DATA:  Hyperglycemia.  EXAM: CHEST  2 VIEW  COMPARISON:  Chest radiograph November 28, 2008.  FINDINGS: Cardiac silhouette is unremarkable, mildly calcified aorta. Similar mild increased lung volumes and interstitial prominence without pleural effusions or focal consolidations. Trachea projects midline and there is no pneumothorax.  Suture anchor right humeral head. Surgical clips in the abdomen may reflect cholecystectomy.  IMPRESSION: COPD without superimposed acute cardiopulmonary process.   Electronically Signed   By: Elon Alas   On: 08/27/2013 22:52    CBC  Recent Labs Lab 08/27/13 2205 08/28/13 0032 08/28/13 0454 08/29/13 0310 08/30/13 0308  WBC 9.5  --  11.4* 11.5* 10.8*  HGB 14.9 15.0 13.3 12.9 12.0  HCT 41.5 44.0 38.4 37.3 34.3*  PLT 170  --  185 176 171  MCV 88.3  --  88.7 88.4 87.7  MCH 31.7  --  30.7 30.6 30.7  MCHC 35.9  --  34.6 34.6 35.0  RDW 14.6  --  15.1 15.5 15.6*  LYMPHSABS 2.9  --  3.3  --   --   MONOABS 1.4*  --  1.3*  --   --   EOSABS 0.2  --  0.2  --   --   BASOSABS 0.0  --  0.0  --   --     Chemistries   Recent Labs Lab 08/28/13 0032 08/28/13 0035  08/28/13 2020 08/28/13 2240 08/29/13 0310 08/29/13 0817 08/30/13 0308  NA 143  --   < > 139 140 140 141 143  K 2.9*  --   < > 4.0 3.7 3.2* 4.3 3.8  CL 103  --   < > 104 107 107 110 110  CO2  --   --   < > 18* 17*  18* 18* 19  GLUCOSE 260*  --   < > 296* 168* 168* 175* 129*  BUN 16  --   < > 9 8 7 6 6   CREATININE 1.50*  --   < > 0.97 0.95 0.93 0.95 0.99  CALCIUM  --   --   < > 10.0 10.3 10.0 10.2 10.0  MG  --  1.8  --   --   --   --   --   --   < > = values in this interval not displayed. ------------------------------------------------------------------------------------------------------------------ estimated creatinine clearance is 57.8 ml/min (by C-G formula based on Cr of 0.99). ------------------------------------------------------------------------------------------------------------------  Recent Labs  08/28/13 0454  HGBA1C 9.1*   ------------------------------------------------------------------------------------------------------------------ No results found for this basename: CHOL, HDL, LDLCALC, TRIG, CHOLHDL, LDLDIRECT,  in the last 72 hours ------------------------------------------------------------------------------------------------------------------  Recent Labs  08/28/13 0454  TSH 1.160   ------------------------------------------------------------------------------------------------------------------ No results found for this basename: VITAMINB12, FOLATE, FERRITIN, TIBC, IRON, RETICCTPCT,  in the last 72 hours  Coagulation profile No results found for this basename: INR, PROTIME,  in the last 168 hours  No results found for this basename: DDIMER,  in the last 72 hours  Cardiac Enzymes  Recent Labs Lab 08/28/13 [place] <0.30   ------------------------------------------------------------------------------------------------------------------ No components found with this basename: Steffanie Rainwater D.O. on 08/30/2013 at  7:24 AM  Between 7am to 7pm - Pager - [phone]  After 7pm go to [email or web address] - password [email or web address]  And look for the night coverage person covering for me after hours  [facility] Office  [phone]

## 2013-08-30 NOTE — Evaluation (Signed)
Physical Therapy Evaluation Patient Details Name: Michelle Farrell MRN: [id number] DOB: [birth year] Today's Date: 08/30/2013   History of Present Illness  73 yo female admitted with DKA, hypertension. hx of CHF, HTN, chronic back pain, DM, Crohn's, COPD, neuropathy. Pt lives alone  Clinical Impression  On eval, pt required supervision level assist for mobility-able to ambulate ~150 feet with use of walker. Pt normally uses cane. Reports 7/10 back pain with activity.     Follow Up Recommendations No PT follow up    Equipment Recommendations  None recommended by PT    Recommendations for Other Services OT consult     Precautions / Restrictions Precautions Precautions: Fall Restrictions Weight Bearing Restrictions: No      Mobility  Bed Mobility Overal bed mobility: Modified Independent                Transfers Overall transfer level: Needs assistance   Transfers: Sit to/from Stand Sit to Stand: Supervision            Ambulation/Gait Ambulation/Gait assistance: Supervision Ambulation Distance (Feet): 150 Feet Assistive device: Rolling walker (2 wheeled) Gait Pattern/deviations: Decreased stride length;Step-through pattern     General Gait Details: slow gait speed. VCS safety, distance from walker. Pt reports back pain while ambulating-7/10  Stairs            Wheelchair Mobility    Modified Rankin (Stroke Patients Only)       Balance                                             Pertinent Vitals/Pain 7/10 back "chronic". Repositioned in bed at end of session    [place] expects to be discharged to:: Private residence Living Arrangements: Alone   Type of Home: Apartment Home Access: Level entry     Home Layout: One [place] - single point;Walker - 2 wheels      Prior Function Level of Independence: Independent with assistive device(s)         Comments: uses cane for ambulation      Hand Dominance        Extremity/Trunk Assessment   Upper Extremity Assessment: Overall WFL for tasks assessed           Lower Extremity Assessment: Overall WFL for tasks assessed      Cervical / Trunk Assessment: Normal  Communication   Communication: No difficulties  Cognition Arousal/Alertness: Awake/alert Behavior During Therapy: WFL for tasks assessed/performed Overall Cognitive Status: Within Functional Limits for tasks assessed                      General Comments      Exercises        Assessment/Plan    PT Assessment Patient needs continued PT services  PT Diagnosis Difficulty walking;Generalized weakness   PT Problem List Pain;Decreased mobility;Decreased activity tolerance  PT Treatment Interventions DME instruction;Gait training;Functional mobility training;Therapeutic activities;Therapeutic exercise;Patient/family education   PT Goals (Current goals can be found in the Care Plan section) Acute Rehab PT Goals Patient Stated Goal: none stated PT Goal Formulation: With patient Time For Goal Achievement: 09/13/13 Potential to Achieve Goals: Good    Frequency Min 3X/week   Barriers to discharge        Co-evaluation               End  of Session Equipment Utilized During Treatment: Gait belt Activity Tolerance: Patient limited by pain Patient left: in bed;with call bell/phone within reach           Time: 1411-1429 PT Time Calculation (min): 18 min   Charges:   PT Evaluation $Initial PT Evaluation Tier I: 1 Procedure PT Treatments $Gait Training: 8-22 mins   PT G Codes:          Weston Anna, MPT Pager: [phone]

## 2013-08-30 NOTE — Progress Notes (Signed)
Inpatient Diabetes Program Recommendations  AACE/ADA: New Consensus Statement on Inpatient Glycemic Control (2013)  Target Ranges:  Prepandial:   less than 140 mg/dL      Peak postprandial:   less than 180 mg/dL (1-2 hours)      Critically ill patients:  140 - 180 mg/dL   Reason for Visit: Hyperglycemia  Results for MARGERET, STACHNIK (MRN [id number]) as of 08/30/2013 16:10  Ref. Range 08/29/2013 19:26 08/29/2013 23:44 08/30/2013 03:16 08/30/2013 07:39 08/30/2013 12:17  Glucose-Capillary Latest Range: 70-99 mg/dL 261 (H) 154 (H) 117 (H) 194 (H) 244 (H)     Inpatient Diabetes Program Recommendations Insulin - Meal Coverage: Increase Novolog to 10 units tidwc for meal coverage insulin  Note: Will continue to follow. Thank you. Lorenda Peck, RD, LDN, CDE Inpatient Diabetes Coordinator [phone]

## 2013-08-31 LAB — GLUCOSE, CAPILLARY
GLUCOSE-CAPILLARY: 166 mg/dL — AB (ref 70–99)
GLUCOSE-CAPILLARY: 282 mg/dL — AB (ref 70–99)
Glucose-Capillary: 141 mg/dL — ABNORMAL HIGH (ref 70–99)

## 2013-08-31 LAB — BASIC METABOLIC PANEL
BUN: 7 mg/dL (ref 6–23)
CALCIUM: 10.6 mg/dL — AB (ref 8.4–10.5)
CO2: 20 mEq/L (ref 19–32)
CREATININE: 1.05 mg/dL (ref 0.50–1.10)
Chloride: 107 mEq/L (ref 96–112)
GFR calc Af Amer: 60 mL/min — ABNORMAL LOW (ref >90)
GFR, EST NON AFRICAN AMERICAN: 52 mL/min — AB (ref >90)
Glucose, Bld: 143 mg/dL — ABNORMAL HIGH (ref 70–99)
POTASSIUM: 3.8 meq/L (ref 3.7–5.3)
Sodium: 141 mEq/L (ref 137–147)

## 2013-08-31 MED ORDER — INSULIN GLARGINE 100 UNIT/ML ~~LOC~~ SOLN: 50.0000 [IU] | Freq: Every day | SUBCUTANEOUS | Status: DC

## 2013-08-31 MED ORDER — HYDRALAZINE HCL 50 MG PO TABS: 50.0000 mg | ORAL_TABLET | Freq: Three times a day (TID) | ORAL | Status: DC

## 2013-08-31 MED ORDER — CLONIDINE HCL 0.1 MG PO TABS: 0.1000 mg | ORAL_TABLET | Freq: Two times a day (BID) | ORAL | Status: DC

## 2013-08-31 NOTE — Progress Notes (Signed)
Patient's d/c instructions given,verbalized understanding. Stable.Sandie Ano Rn

## 2013-08-31 NOTE — Discharge Summary (Signed)
Physician Discharge Summary  Michelle Farrell RKY:[id number] DOB: 03-01-1941 DOA: 08/27/2013  PCP: Michelle Shin, MD  Admit date: 08/27/2013 Discharge date: 08/31/2013  Time spent: 45 minutes  Recommendations for Outpatient Follow-up:  Patient will be discharged to home.  She is to followup with her primary care physician within one week of discharge. Patient to continue taking her medications as prescribed. She should continue checking her blood sugars at home. Patient should discuss her blood pressure as well as diabetes regimens with her primary care physician. Patient should follow a carb modified heart healthy diet with a 1500 mL fluid restriction per day.  Discharge Diagnoses:  Diabetic ketoacidosis Accelerated hypertension History of CHF, unspecified COPD History of Crohn's disease Hyperlipidemia Chronic kidney disease stage III Headaches Discharge Condition: Stable  Diet recommendation: Carb modified/Heart healthy with 1532m fluid restriction  Filed Weights   08/28/13 0243 08/29/13 0302 08/30/13 1138  Weight: 84.3 kg (185 lb 13.6 oz) 86.9 kg (191 lb 9.3 oz) 87.907 kg (193 lb 12.8 oz)    History of present illness:  Michelle Degraffenreidis a 73y.o. female with history of diabetes mellitus, hypertension, chronic kidney disease, chronic back pain, CHF, Crohn's disease was found to have elevated blood pressure by patient's neurologist on Monday and was advised to followup with patient's PCP which patient did on 08/27/13. At patient's PCPs office patient had some lab work which showed elevated blood sugar and was advised to come to the ER. Patient also was prescribed antihypertensives during her visit. In the ER patient was found to have elevated blood sugar with anion gap. Patient also was found to have systolic blood pressure more than 200 and was given one dose of IV labetalol after which patient's blood pressure improved to systolic 1315 For patient's DKA, she was started on IV insulin  infusion. Patient states she has not missed her Lantus dose. Patient used to be on ARB which was discontinued 2 months ago due to worsening renal function. Patient otherwise denied any chest pain, nausea, vomiting, abdominal pain, diarrhea, fever, chills, dysuria. She has mild shortness of breath chronically. Patient received some steroid injections in her knees and hip last month. On initial exam, patient was wheezing.   Hospital Course:  Diabetic ketoacidosis/Diabetes Mellitus with neuropathy  -Patient states she is compliant with her medications at home, Lantus 80 units at night as well as Januvia, however patient was taking only 8 units of Lantus accidentally.  -Gap closed  -Hemoglobin A1c 9.1  -Blood sugars are more controlled  -Was placed on Lantus 50 units daily, moderate ISS, and novolog 6 units TID with meals  -Patient's diet advanced, was able to tolerate well.  -Diabetes coordinator consulted and following  -Continue gabapentin for neuropathy  -Will discharge patient on Lantus 50 units daily and Januvia -Would recommend that patient be placed on meal time insulin coverage, but this should be discussed with her primary care physician  Accelerated hypertension  -Under better control  -Continue hydralazine and clonidine  History of CHF, unspecified  -Compensated -Monitored patient's daily weights as well as intake and output  -BNP 251  -Resume lasix and spironolactone at discharge, held due to DKA  COPD  -Currently stable, continue Pulmicort and nebulizer treatments   History of Crohn's disease  -Continue sulfasalazine   Hyperlipidemia  -Continue simvastatin   Chronic kidney disease, stage III  -Creatinine appears to be at baseline   Headaches  -Likely secondary to her accelerated hypertension  -Resovled  Procedures: None  Consultations: None  Discharge Exam: Filed Vitals:   08/31/13 0444  BP: 152/48  Pulse: 68  Temp: 98.4 F (36.9 C)  Resp:     Exam    General: Well developed, well nourished, NAD, appears stated age  HEENT: NCAT, mucous membranes moist  Neck: Supple, no JVD, no masses  Cardiovascular: S1 S2 auscultated, 2/6 SEM. Regular rate and rhythm.  Respiratory: Clear to auscultation bilaterally with equal chest rise  Abdomen: Soft, nontender, nondistended, + bowel sounds  Extremities: warm dry without cyanosis clubbing or edema  Neuro: AAOx3, no focal deficits  Skin: Without rashes exudates or nodules  Psych: Normal affect and demeanor with intact judgement and insight  Discharge Instructions      Discharge Instructions   Diet - low sodium heart healthy    Complete by:  As directed   1500 mL fluid restriction per day     Diet Carb Modified    Complete by:  As directed      Discharge instructions    Complete by:  As directed   Patient will be discharged to home.  She is to followup with her primary care physician within one week of discharge. Patient to continue taking her medications as prescribed. She should continue checking her blood sugars at home. Patient should discuss her blood pressure as well as diabetes regimens with her primary care physician. Patient should follow a carb modified heart healthy diet with a 1500 mL fluid restriction per day.     Increase activity slowly    Complete by:  As directed             Medication List    STOP taking these medications       PRAVASTATIN SODIUM PO      TAKE these medications       cloNIDine 0.1 MG tablet  Commonly known as:  CATAPRES  Take 1 tablet (0.1 mg total) by mouth 2 (two) times daily.     furosemide 40 MG tablet  Commonly known as:  LASIX  Take 80 mg by mouth.     gabapentin 600 MG tablet  Commonly known as:  NEURONTIN  Take 600 mg by mouth 3 (three) times daily.     hydrALAZINE 50 MG tablet  Commonly known as:  APRESOLINE  Take 1 tablet (50 mg total) by mouth every 8 (eight) hours.     HYDROcodone-acetaminophen 10-325 MG per tablet  Commonly  known as:  NORCO  Take 1 tablet by mouth every 6 (six) hours as needed (for pain.).     insulin glargine 100 UNIT/ML injection  Commonly known as:  LANTUS  Inject 0.5 mLs (50 Units total) into the skin at bedtime.     metoCLOPramide 10 MG tablet  Commonly known as:  REGLAN  Take 10 mg by mouth 4 (four) times daily.     omeprazole 20 MG capsule  Commonly known as:  PRILOSEC  Take 20 mg by mouth every morning.     simvastatin 40 MG tablet  Commonly known as:  ZOCOR  Take 40 mg by mouth at bedtime.     sitaGLIPtin 100 MG tablet  Commonly known as:  JANUVIA  Take 100 mg by mouth every morning.     spironolactone 25 MG tablet  Commonly known as:  ALDACTONE  Take 25 mg by mouth.     sulfaSALAzine 500 MG tablet  Commonly known as:  AZULFIDINE  Take 500 mg by mouth 4 (four) times daily.     temazepam  15 MG capsule  Commonly known as:  RESTORIL  Take 15 mg by mouth at bedtime.     tiZANidine 4 MG tablet  Commonly known as:  ZANAFLEX  Take 4 mg by mouth 2 (two) times daily.     triamcinolone cream 0.1 %  Commonly known as:  KENALOG  Apply 1 application topically as needed. Applied to arms & chest for itching       No Known Allergies Follow-up Information   Call Michelle Shin, MD. (On Monday for followup appointment)    Specialty:  Endocrinology   Contact information:   [address] [place] [address] [phone]        The results of significant diagnostics from this hospitalization (including imaging, microbiology, ancillary and laboratory) are listed below for reference.    Significant Diagnostic Studies: Dg Chest 2 View  08/27/2013   CLINICAL DATA:  Hyperglycemia.  EXAM: CHEST  2 VIEW  COMPARISON:  Chest radiograph November 28, 2008.  FINDINGS: Cardiac silhouette is unremarkable, mildly calcified aorta. Similar mild increased lung volumes and interstitial prominence without pleural effusions or focal consolidations. Trachea projects midline  and there is no pneumothorax.  Suture anchor right humeral head. Surgical clips in the abdomen may reflect cholecystectomy.  IMPRESSION: COPD without superimposed acute cardiopulmonary process.   Electronically Signed   By: Elon Alas   On: 08/27/2013 22:52    Microbiology: Recent Results (from the past 240 hour(s))  MRSA PCR SCREENING     Status: Abnormal   Collection Time    08/28/13  2:48 AM      Result Value Ref Range Status   MRSA by PCR INVALID RESULTS, SPECIMEN SENT FOR CULTURE (*) NEGATIVE Final   Comment: RESULT CALLED TO, READ BACK BY AND VERIFIED WITH:     BANKS,T RN AT 0508 06.03.15                The GeneXpert MRSA Assay (FDA     approved for NASAL specimens     only), is one component of a     comprehensive MRSA colonization     surveillance program. It is not     intended to diagnose MRSA     infection nor to guide or     monitor treatment for     MRSA infections.  MRSA CULTURE     Status: None   Collection Time    08/28/13  2:48 AM      Result Value Ref Range Status   Specimen Description NOSE   Final   Special Requests NONE   Final   Culture     Final   Value: NO STAPHYLOCOCCUS AUREUS ISOLATED     Note: NOMRSA     Performed at [facility]   Report Status 08/30/2013 FINAL   Final     Labs: Basic Metabolic Panel:  Recent Labs Lab 08/28/13 0032 08/28/13 0035  08/28/13 2240 08/29/13 0310 08/29/13 0817 08/30/13 0308 08/31/13 0520  NA 143  --   < > 140 140 141 143 141  K 2.9*  --   < > 3.7 3.2* 4.3 3.8 3.8  CL 103  --   < > 107 107 110 110 107  CO2  --   --   < > 17* 18* 18* 19 20  GLUCOSE 260*  --   < > 168* 168* 175* 129* 143*  BUN 16  --   < > 8 7 6 6  7  CREATININE 1.50*  --   < > 0.95 0.93 0.95 0.99 1.05  CALCIUM  --   --   < > 10.3 10.0 10.2 10.0 10.6*  MG  --  1.8  --   --   --   --   --   --   < > = values in this interval not displayed. Liver Function Tests: No results found for this basename: AST, ALT, ALKPHOS, BILITOT,  PROT, ALBUMIN,  in the last 168 hours No results found for this basename: LIPASE, AMYLASE,  in the last 168 hours No results found for this basename: AMMONIA,  in the last 168 hours CBC:  Recent Labs Lab 08/27/13 2205 08/28/13 0032 08/28/13 0454 08/29/13 0310 08/30/13 0308  WBC 9.5  --  11.4* 11.5* 10.8*  NEUTROABS 5.0  --  6.6  --   --   HGB 14.9 15.0 13.3 12.9 12.0  HCT 41.5 44.0 38.4 37.3 34.3*  MCV 88.3  --  88.7 88.4 87.7  PLT 170  --  185 176 171   Cardiac Enzymes:  Recent Labs Lab 08/28/13 0454  TROPONINI <0.30   BNP: BNP (last 3 results)  Recent Labs  08/27/13 2205 08/28/13 0454  PROBNP 263.1* 251.0*   CBG:  Recent Labs Lab 08/30/13 1619 08/30/13 2003 08/30/13 2357 08/31/13 0441 08/31/13 0737  GLUCAP 254* 132* 134* 141* 166*       Signed:  Levent Kornegay  [facility] 08/31/2013, 8:41 AM

## 2013-08-31 NOTE — Progress Notes (Signed)
Patient d/c home,Stable. No c/o pain.Sandie Ano RN

## 2013-08-31 NOTE — Discharge Instructions (Signed)
Diabetic Ketoacidosis Diabetic ketoacidosis (DKA) is a life-threatening complication of type 1 diabetes. It must be quickly recognized and treated. Treatment requires hospitalization. CAUSES  When there is no insulin in the body, glucose (sugar) cannot be used and the body breaks down fat for energy. When fat breaks down, acids (ketones) build up in the blood. Very high levels of glucose and high levels of acids lead to severe loss of body fluids (dehydration) and other dangerous chemical changes. This stresses your vital organs and can cause coma or death. SYMPTOMS   Tiredness (fatigue).  Weight loss.  Excessive thirst.  Ketones in the urine.  Lightheadedness.  Fruity or sweet smell on your breath.  Excessive urination.  Visual changes.  Confusion or irritability.  Feeling sick to your stomach (nauseous) or vomiting.  Rapid breathing.  Stomachache or belly (abdominal) pain. DIAGNOSIS  Your caregiver will diagnose DKA based on your history, physical exam, and blood tests. Your caregiver will check if there is another illness present which caused you to go into DKA. Most of this will be done quickly in an emergency room. TREATMENT   Fluid replacement to correct dehydration.  Insulin.  Correction of electrolytes, such as potassium and sodium.  Medicines (antibiotics) that kill germs for infections. PREVENTION  Always take your insulin. Do not skip your insulin injections.  If you are ill, treat yourself quickly. Your body often needs more insulin to fight the illness.  Check your blood glucose regularly.  Check urine ketones if your blood glucose is greater than 240 milligrams per deciliter (mg/dl).  Do not used expired or outdated insulin.  If your blood glucose is high, drink plenty of fluids. This helps flush out ketones. HOME CARE INSTRUCTIONS   If you are ill, follow the advice of your caregiver.  To prevent loss of body fluids (dehydration), drink enough  water and fluids to keep your urine clear or pale yellow.  If you cannot eat, alternate between drinking fluids with sugar (soda, juices, flavored gelatin) and salty fluids (broth, bouillon).  If you can eat, follow your usual diet and drink sugar-free liquids (water, diet drinks).  Always take your usual dose of insulin. If you cannot eat, or your glucose is getting too low, call your caregiver for further instructions.  Continue to monitor your blood or urine ketones every 3 to 4 hours around the clock. Set your alarm clock or have someone wake you up. If you are too sick, have someone test it for you.  Rest and avoid exercise. SEEK MEDICAL CARE IF:   You have ketones in your urine or your blood glucose is higher than a level your caregiver suggests. You may need extra insulin. Call your caregiver if you need advice on adjusting your insulin.  You cannot drink at least a tablespoon of fluid every 15 to 20 minutes.  You have been throwing up for more than 2 hours.  You have symptoms of DKA:  Fruity smelling breath.  Breathing faster or slower.  Becoming very sleepy. SEEK IMMEDIATE MEDICAL CARE IF:   You have signs of dehydration:  Decreased urination.  Increased thirst.  Dry skin and mouth.  Lightheadedness.  Your blood glucose is very high (as advised by your caregiver) twice in a row.  You or your child has an oral temperature above 102 F (38.9 C), not controlled by medicine.  You pass out.  You have chest pain and/or trouble breathing.  You have a sudden, severe headache.  You have sudden  weakness in one arm and/or one leg.  You have sudden difficulty speaking and/or swallowing.  You develop vomiting and/or diarrhea that is getting worse after 3 to 4 hours.  You have abdominal pain. MAKE SURE YOU:   Understand these instructions.  Will watch your condition.  Will get help right away if you are not doing well or get worse. Document Released:  03/11/2000 Document Revised: 06/06/2011 Document Reviewed: 09/17/2008 [facility] Patient Information 2014 [place], [place].

## 2013-09-02 NOTE — Progress Notes (Signed)
Discharge summary sent to payer through [facility]  

## 2014-05-16 ENCOUNTER — Encounter (HOSPITAL_COMMUNITY): Payer: Self-pay | Admitting: Emergency Medicine

## 2014-05-16 ENCOUNTER — Emergency Department (HOSPITAL_COMMUNITY): Payer: Medicare Other

## 2014-05-16 ENCOUNTER — Inpatient Hospital Stay (HOSPITAL_COMMUNITY)
Admission: EM | Admit: 2014-05-16 | Discharge: 2014-05-20 | DRG: 683 | Disposition: A | Discharge status: Home / Self Care | Payer: Medicare Other | Attending: Family Medicine | Admitting: Family Medicine

## 2014-05-16 DIAGNOSIS — A084 Viral intestinal infection, unspecified: Secondary | ICD-10-CM | POA: Diagnosis present

## 2014-05-16 DIAGNOSIS — K219 Gastro-esophageal reflux disease without esophagitis: Secondary | ICD-10-CM | POA: Diagnosis present

## 2014-05-16 DIAGNOSIS — R531 Weakness: Secondary | ICD-10-CM | POA: Diagnosis not present

## 2014-05-16 DIAGNOSIS — K5090 Crohn's disease, unspecified, without complications: Secondary | ICD-10-CM | POA: Diagnosis present

## 2014-05-16 DIAGNOSIS — N19 Unspecified kidney failure: Secondary | ICD-10-CM

## 2014-05-16 DIAGNOSIS — Z794 Long term (current) use of insulin: Secondary | ICD-10-CM

## 2014-05-16 DIAGNOSIS — E785 Hyperlipidemia, unspecified: Secondary | ICD-10-CM | POA: Diagnosis present

## 2014-05-16 DIAGNOSIS — E119 Type 2 diabetes mellitus without complications: Secondary | ICD-10-CM

## 2014-05-16 DIAGNOSIS — I5032 Chronic diastolic (congestive) heart failure: Secondary | ICD-10-CM

## 2014-05-16 DIAGNOSIS — N179 Acute kidney failure, unspecified: Principal | ICD-10-CM | POA: Diagnosis present

## 2014-05-16 DIAGNOSIS — I10 Essential (primary) hypertension: Secondary | ICD-10-CM | POA: Diagnosis present

## 2014-05-16 DIAGNOSIS — Z90710 Acquired absence of both cervix and uterus: Secondary | ICD-10-CM

## 2014-05-16 DIAGNOSIS — F17210 Nicotine dependence, cigarettes, uncomplicated: Secondary | ICD-10-CM | POA: Diagnosis present

## 2014-05-16 DIAGNOSIS — M549 Dorsalgia, unspecified: Secondary | ICD-10-CM

## 2014-05-16 DIAGNOSIS — R112 Nausea with vomiting, unspecified: Secondary | ICD-10-CM

## 2014-05-16 DIAGNOSIS — Z79899 Other long term (current) drug therapy: Secondary | ICD-10-CM

## 2014-05-16 DIAGNOSIS — Z833 Family history of diabetes mellitus: Secondary | ICD-10-CM

## 2014-05-16 DIAGNOSIS — R109 Unspecified abdominal pain: Secondary | ICD-10-CM

## 2014-05-16 DIAGNOSIS — Z79891 Long term (current) use of opiate analgesic: Secondary | ICD-10-CM

## 2014-05-16 DIAGNOSIS — G8929 Other chronic pain: Secondary | ICD-10-CM | POA: Diagnosis present

## 2014-05-16 DIAGNOSIS — I5030 Unspecified diastolic (congestive) heart failure: Secondary | ICD-10-CM | POA: Diagnosis present

## 2014-05-16 DIAGNOSIS — E860 Dehydration: Secondary | ICD-10-CM | POA: Diagnosis present

## 2014-05-16 DIAGNOSIS — R1900 Intra-abdominal and pelvic swelling, mass and lump, unspecified site: Secondary | ICD-10-CM | POA: Diagnosis present

## 2014-05-16 DIAGNOSIS — R197 Diarrhea, unspecified: Secondary | ICD-10-CM

## 2014-05-16 LAB — CBC WITH DIFFERENTIAL/PLATELET
BASOS PCT: 0 % (ref 0–1)
Basophils Absolute: 0 10*3/uL (ref 0.0–0.1)
EOS ABS: 0 10*3/uL (ref 0.0–0.7)
Eosinophils Relative: 0 % (ref 0–5)
HEMATOCRIT: 38.4 % (ref 36.0–46.0)
Hemoglobin: 13.4 g/dL (ref 12.0–15.0)
LYMPHS PCT: 18 % (ref 12–46)
Lymphs Abs: 2.6 10*3/uL (ref 0.7–4.0)
MCH: 31.3 pg (ref 26.0–34.0)
MCHC: 34.9 g/dL (ref 30.0–36.0)
MCV: 89.7 fL (ref 78.0–100.0)
Monocytes Absolute: 1.3 10*3/uL — ABNORMAL HIGH (ref 0.1–1.0)
Monocytes Relative: 9 % (ref 3–12)
NEUTROS PCT: 73 % (ref 43–77)
Neutro Abs: 10.3 10*3/uL — ABNORMAL HIGH (ref 1.7–7.7)
Platelets: 253 10*3/uL (ref 150–400)
RBC: 4.28 MIL/uL (ref 3.87–5.11)
RDW: 14.7 % (ref 11.5–15.5)
WBC: 14.3 10*3/uL — ABNORMAL HIGH (ref 4.0–10.5)

## 2014-05-16 LAB — BASIC METABOLIC PANEL WITH GFR
Anion gap: 18 — ABNORMAL HIGH (ref 5–15)
BUN: 48 mg/dL — ABNORMAL HIGH (ref 6–23)
CO2: 16 mmol/L — ABNORMAL LOW (ref 19–32)
Calcium: 10.8 mg/dL — ABNORMAL HIGH (ref 8.4–10.5)
Chloride: 101 mmol/L (ref 96–112)
Creatinine, Ser: 3.07 mg/dL — ABNORMAL HIGH (ref 0.50–1.10)
GFR calc Af Amer: 16 mL/min — ABNORMAL LOW
GFR calc non Af Amer: 14 mL/min — ABNORMAL LOW
Glucose, Bld: 110 mg/dL — ABNORMAL HIGH (ref 70–99)
Potassium: 4.5 mmol/L (ref 3.5–5.1)
Sodium: 135 mmol/L (ref 135–145)

## 2014-05-16 LAB — CBG MONITORING, ED
Glucose-Capillary: 89 mg/dL (ref 70–99)
Glucose-Capillary: 102 mg/dL — ABNORMAL HIGH (ref 70–99)

## 2014-05-16 MED ORDER — ONDANSETRON HCL 4 MG/2ML IJ SOLN
4.0000 mg | Freq: Once | INTRAMUSCULAR | Status: AC
  Administered 2014-05-16: 4 mg via INTRAVENOUS
  Filled 2014-05-16: qty 2

## 2014-05-16 MED ORDER — ONDANSETRON HCL 4 MG PO TABS: 4.0000 mg | ORAL_TABLET | Freq: Four times a day (QID) | ORAL | Status: DC | PRN

## 2014-05-16 MED ORDER — SIMVASTATIN 40 MG PO TABS
40.0000 mg | ORAL_TABLET | Freq: Every day | ORAL | Status: DC
  Administered 2014-05-16 – 2014-05-19 (×4): 40 mg via ORAL
  Filled 2014-05-16 (×5): qty 1

## 2014-05-16 MED ORDER — ALBUTEROL SULFATE HFA 108 (90 BASE) MCG/ACT IN AERS: 1.0000 | INHALATION_SPRAY | Freq: Four times a day (QID) | RESPIRATORY_TRACT | Status: DC | PRN

## 2014-05-16 MED ORDER — HEPARIN SODIUM (PORCINE) 5000 UNIT/ML IJ SOLN
5000.0000 [IU] | Freq: Three times a day (TID) | INTRAMUSCULAR | Status: DC
Start: 2014-05-16 — End: 2014-05-20
  Administered 2014-05-16 – 2014-05-20 (×11): 5000 [IU] via SUBCUTANEOUS
  Filled 2014-05-16 (×14): qty 1

## 2014-05-16 MED ORDER — SODIUM CHLORIDE 0.9 % IV SOLN
INTRAVENOUS | Status: DC
  Administered 2014-05-16: via INTRAVENOUS

## 2014-05-16 MED ORDER — ACETAMINOPHEN 325 MG PO TABS: 650.0000 mg | ORAL_TABLET | Freq: Four times a day (QID) | ORAL | Status: DC | PRN

## 2014-05-16 MED ORDER — PANTOPRAZOLE SODIUM 40 MG PO TBEC
40.0000 mg | DELAYED_RELEASE_TABLET | Freq: Every day | ORAL | Status: DC
  Administered 2014-05-17 – 2014-05-18 (×2): 40 mg via ORAL
  Filled 2014-05-16 (×2): qty 1

## 2014-05-16 MED ORDER — ALUM & MAG HYDROXIDE-SIMETH 200-200-20 MG/5ML PO SUSP
30.0000 mL | Freq: Four times a day (QID) | ORAL | Status: DC | PRN
  Administered 2014-05-17: 30 mL via ORAL
  Filled 2014-05-16 (×2): qty 30

## 2014-05-16 MED ORDER — SODIUM CHLORIDE 0.9 % IV BOLUS (SEPSIS): 1000.0000 mL | Freq: Once | INTRAVENOUS | Status: DC

## 2014-05-16 MED ORDER — SULFASALAZINE 500 MG PO TABS
500.0000 mg | ORAL_TABLET | Freq: Four times a day (QID) | ORAL | Status: DC
  Administered 2014-05-16 – 2014-05-20 (×13): 500 mg via ORAL
  Filled 2014-05-16 (×17): qty 1

## 2014-05-16 MED ORDER — VITAMIN D 1000 UNITS PO TABS
2000.0000 [IU] | ORAL_TABLET | Freq: Every day | ORAL | Status: DC
  Administered 2014-05-17 – 2014-05-20 (×4): 2000 [IU] via ORAL
  Filled 2014-05-16 (×4): qty 2

## 2014-05-16 MED ORDER — SODIUM CHLORIDE 0.9 % IV BOLUS (SEPSIS)
500.0000 mL | Freq: Once | INTRAVENOUS | Status: AC
  Administered 2014-05-16: 500 mL via INTRAVENOUS

## 2014-05-16 MED ORDER — OXYCODONE HCL 5 MG PO TABS
5.0000 mg | ORAL_TABLET | ORAL | Status: DC | PRN
  Administered 2014-05-20: 5 mg via ORAL
  Filled 2014-05-16: qty 1

## 2014-05-16 MED ORDER — GABAPENTIN 300 MG PO CAPS
600.0000 mg | ORAL_CAPSULE | Freq: Three times a day (TID) | ORAL | Status: DC
  Administered 2014-05-16 – 2014-05-20 (×10): 600 mg via ORAL
  Filled 2014-05-16 (×13): qty 2

## 2014-05-16 MED ORDER — METOCLOPRAMIDE HCL 10 MG PO TABS
10.0000 mg | ORAL_TABLET | Freq: Three times a day (TID) | ORAL | Status: DC
Start: 2014-05-17 — End: 2014-05-18
  Administered 2014-05-17 – 2014-05-18 (×6): 10 mg via ORAL
  Filled 2014-05-16 (×8): qty 1

## 2014-05-16 MED ORDER — FOLIC ACID 1 MG PO TABS
1.0000 mg | ORAL_TABLET | Freq: Every day | ORAL | Status: DC
  Administered 2014-05-17 – 2014-05-20 (×4): 1 mg via ORAL
  Filled 2014-05-16 (×4): qty 1

## 2014-05-16 MED ORDER — HYDRALAZINE HCL 20 MG/ML IJ SOLN
5.0000 mg | Freq: Four times a day (QID) | INTRAMUSCULAR | Status: DC | PRN
  Administered 2014-05-18 – 2014-05-19 (×3): 5 mg via INTRAVENOUS
  Filled 2014-05-16 (×3): qty 1

## 2014-05-16 MED ORDER — ALBUTEROL SULFATE (2.5 MG/3ML) 0.083% IN NEBU: 2.5000 mg | INHALATION_SOLUTION | Freq: Four times a day (QID) | RESPIRATORY_TRACT | Status: DC | PRN

## 2014-05-16 MED ORDER — ONDANSETRON HCL 4 MG/2ML IJ SOLN
4.0000 mg | Freq: Four times a day (QID) | INTRAMUSCULAR | Status: DC | PRN
  Administered 2014-05-18 (×3): 4 mg via INTRAVENOUS
  Filled 2014-05-16 (×3): qty 2

## 2014-05-16 MED ORDER — HYDROMORPHONE HCL 1 MG/ML IJ SOLN
0.5000 mg | INTRAMUSCULAR | Status: DC | PRN
  Administered 2014-05-18: 1 mg via INTRAVENOUS
  Filled 2014-05-16: qty 1

## 2014-05-16 MED ORDER — ACETAMINOPHEN 650 MG RE SUPP: 650.0000 mg | Freq: Four times a day (QID) | RECTAL | Status: DC | PRN

## 2014-05-16 MED ORDER — INSULIN ASPART 100 UNIT/ML ~~LOC~~ SOLN
0.0000 [IU] | SUBCUTANEOUS | Status: DC
  Administered 2014-05-17 – 2014-05-18 (×5): 2 [IU] via SUBCUTANEOUS
  Administered 2014-05-18: 3 [IU] via SUBCUTANEOUS
  Administered 2014-05-18: 2 [IU] via SUBCUTANEOUS
  Administered 2014-05-18: 1 [IU] via SUBCUTANEOUS
  Administered 2014-05-19 (×2): 2 [IU] via SUBCUTANEOUS
  Administered 2014-05-19 (×2): 1 [IU] via SUBCUTANEOUS
  Administered 2014-05-19: 2 [IU] via SUBCUTANEOUS
  Administered 2014-05-19: 1 [IU] via SUBCUTANEOUS
  Administered 2014-05-20: 2 [IU] via SUBCUTANEOUS
  Administered 2014-05-20 (×2): 1 [IU] via SUBCUTANEOUS
  Administered 2014-05-20: 2 [IU] via SUBCUTANEOUS

## 2014-05-16 MED ORDER — TRAZODONE HCL 100 MG PO TABS
200.0000 mg | ORAL_TABLET | Freq: Every day | ORAL | Status: DC
  Administered 2014-05-16 – 2014-05-19 (×4): 200 mg via ORAL
  Filled 2014-05-16 (×5): qty 2

## 2014-05-16 NOTE — ED Notes (Signed)
Bed: RESB Expected date:  Expected time:  Means of arrival:  Comments: Ems- hypoglycemia

## 2014-05-16 NOTE — ED Notes (Signed)
EMS attempt to start IV bilaterally in hands prior to arrival; areas infiltrated at present time. No other visible or palpable sites noted on assessment. Staff trained in [place] IV notified.

## 2014-05-16 NOTE — ED Notes (Signed)
Per EMS pt complaint of "not feeling so well" and n/v/d for a week. No n/v/d in past 24 hours. CBG 53; pt given orange juice and instant glucose to raise CBG to 87.

## 2014-05-16 NOTE — ED Notes (Signed)
Patient transported to CT 

## 2014-05-16 NOTE — ED Notes (Signed)
Pt returned from CT °

## 2014-05-16 NOTE — ED Provider Notes (Signed)
CSN: [id number]     Arrival date & time 05/16/14  1649 History   First MD Initiated Contact with Patient 05/16/14 1720     Chief Complaint  Patient presents with  . Emesis  . Diarrhea     (Consider location/radiation/quality/duration/timing/severity/associated sxs/prior Treatment) HPI Comments: Patient with past medical history of Crohn's, CHF, hypertension, diabetes, presents emergency department with chief complaint of nausea, vomiting, diarrhea. She states the symptoms started about 4 days ago. She denies any fevers chills. Denies any cough or chest pain. She reports mild abdominal cramping. She denies any hematemesis, hematochezia, or melena. She denies any sick contacts. She has not tried taking anything to alleviate her symptoms. She describes the stool as mucus. There are no aggravating factors.  The history is provided by the patient. No language interpreter was used.    Past Medical History  Diagnosis Date  . CHF (congestive heart failure)   . Hypertension   . Back pain, chronic   . Hip pain, chronic   . GERD (gastroesophageal reflux disease)   . Crohn disease   . Diabetes mellitus without complication    Past Surgical History  Procedure Laterality Date  . Abdominal hysterectomy    . Tonsillectomy    . Cholecystectomy    . Bladder surgery     Family History  Problem Relation Age of Onset  . Diabetes Mellitus II Other    History  Substance Use Topics  . Smoking status: Current Every Day Smoker  . Smokeless tobacco: Never Used  . Alcohol Use: No   OB History    No data available     Review of Systems  Constitutional: Negative for fever and chills.  Respiratory: Negative for shortness of breath.   Cardiovascular: Negative for chest pain.  Gastrointestinal: Positive for nausea, vomiting and diarrhea. Negative for constipation.  Genitourinary: Negative for dysuria.  All other systems reviewed and are negative.     Allergies  Review of patient's allergies  indicates no known allergies.  Home Medications   Prior to Admission medications   Medication Sig Start Date End Date Taking? Authorizing Provider  Cholecalciferol (VITAMIN D) 2000 UNITS CAPS Take 1 capsule by mouth daily.   Yes Historical Provider, MD  cloNIDine (CATAPRES) 0.1 MG tablet Take 1 tablet (0.1 mg total) by mouth 2 (two) times daily. 08/31/13  Yes Maryann Mikhail, DO  folic acid (FOLVITE) 1 MG tablet Take 1 mg by mouth daily.   Yes Historical Provider, MD  furosemide (LASIX) 40 MG tablet Take 80 mg by mouth.    Yes Historical Provider, MD  gabapentin (NEURONTIN) 600 MG tablet Take 600 mg by mouth 3 (three) times daily.    Yes Historical Provider, MD  hydrALAZINE (APRESOLINE) 50 MG tablet Take 1 tablet (50 mg total) by mouth every 8 (eight) hours. 08/31/13  Yes Maryann Mikhail, DO  HYDROcodone-acetaminophen (NORCO) 10-325 MG per tablet Take 1 tablet by mouth every 6 (six) hours as needed (for pain.).    Yes Historical Provider, MD  insulin glargine (LANTUS) 100 UNIT/ML injection Inject 0.5 mLs (50 Units total) into the skin at bedtime. Patient taking differently: Inject 60 Units into the skin at bedtime.  08/31/13  Yes Maryann Mikhail, DO  lisinopril (PRINIVIL,ZESTRIL) 10 MG tablet Take 10 mg by mouth daily.   Yes Historical Provider, MD  metoCLOPramide (REGLAN) 10 MG tablet Take 10 mg by mouth 4 (four) times daily. 06/29/13  Yes Historical Provider, MD  omeprazole (PRILOSEC) 20 MG capsule Take 20 mg  by mouth every morning.    Yes Historical Provider, MD  potassium chloride SA (K-DUR,KLOR-CON) 20 MEQ tablet Take 20 mEq by mouth daily.   Yes Historical Provider, MD  simvastatin (ZOCOR) 40 MG tablet Take 40 mg by mouth at bedtime.     Yes Historical Provider, MD  sitaGLIPtin (JANUVIA) 100 MG tablet Take 50 mg by mouth every morning.    Yes Historical Provider, MD  spironolactone (ALDACTONE) 25 MG tablet Take 25 mg by mouth.     Yes Historical Provider, MD  sulfaSALAzine (AZULFIDINE) 500 MG  tablet Take 500 mg by mouth 4 (four) times daily.   Yes Historical Provider, MD  temazepam (RESTORIL) 15 MG capsule Take 15 mg by mouth at bedtime.    Yes Historical Provider, MD  traZODone (DESYREL) 100 MG tablet Take 200 tablets by mouth at bedtime.  04/17/14  Yes Historical Provider, MD  PROAIR HFA 108 (90 BASE) MCG/ACT inhaler Inhale 1 puff into the lungs every 6 (six) hours as needed. Shortness of breath or wheezing 03/03/14   Historical Provider, MD   BP 147/70 mmHg  Pulse 91  Temp(Src) 99.1 F (37.3 C) (Oral)  Resp 18  SpO2 98% Physical Exam  Constitutional: She is oriented to person, place, and time. She appears well-developed and well-nourished.  HENT:  Head: Normocephalic and atraumatic.  Eyes: Conjunctivae and EOM are normal. Pupils are equal, round, and reactive to light.  Neck: Normal range of motion. Neck supple.  Cardiovascular: Normal rate and regular rhythm.  Exam reveals no gallop and no friction rub.   No murmur heard. Pulmonary/Chest: Effort normal and breath sounds normal. No respiratory distress. She has no wheezes. She has no rales. She exhibits no tenderness.  Abdominal: Soft. Bowel sounds are normal. She exhibits no distension and no mass. There is no tenderness. There is no rebound and no guarding.  No focal abdominal tenderness, no RLQ tenderness or pain at McBurney's point, no RUQ tenderness or Murphy's sign, no left-sided abdominal tenderness, no fluid wave, or signs of peritonitis   Musculoskeletal: Normal range of motion. She exhibits no edema or tenderness.  Neurological: She is alert and oriented to person, place, and time.  Skin: Skin is warm and dry.  Psychiatric: She has a normal mood and affect. Her behavior is normal. Judgment and thought content normal.  Nursing note and vitals reviewed.   ED Course  Procedures (including critical care time) Results for orders placed or performed during the hospital encounter of 05/16/14  CBC with  Differential/Platelet  Result Value Ref Range   WBC 14.3 (H) 4.0 - 10.5 K/uL   RBC 4.28 3.87 - 5.11 MIL/uL   Hemoglobin 13.4 12.0 - 15.0 g/dL   HCT 38.4 36.0 - 46.0 %   MCV 89.7 78.0 - 100.0 fL   MCH 31.3 26.0 - 34.0 pg   MCHC 34.9 30.0 - 36.0 g/dL   RDW 14.7 11.5 - 15.5 %   Platelets 253 150 - 400 K/uL   Neutrophils Relative % 73 43 - 77 %   Neutro Abs 10.3 (H) 1.7 - 7.7 K/uL   Lymphocytes Relative 18 12 - 46 %   Lymphs Abs 2.6 0.7 - 4.0 K/uL   Monocytes Relative 9 3 - 12 %   Monocytes Absolute 1.3 (H) 0.1 - 1.0 K/uL   Eosinophils Relative 0 0 - 5 %   Eosinophils Absolute 0.0 0.0 - 0.7 K/uL   Basophils Relative 0 0 - 1 %   Basophils Absolute 0.0  0.0 - 0.1 K/uL  Basic metabolic panel  Result Value Ref Range   Sodium 135 135 - 145 mmol/L   Potassium 4.5 3.5 - 5.1 mmol/L   Chloride 101 96 - 112 mmol/L   CO2 16 (L) 19 - 32 mmol/L   Glucose, Bld 110 (H) 70 - 99 mg/dL   BUN 48 (H) 6 - 23 mg/dL   Creatinine, Ser 3.07 (H) 0.50 - 1.10 mg/dL   Calcium 10.8 (H) 8.4 - 10.5 mg/dL   GFR calc non Af Amer 14 (L) >90 mL/min   GFR calc Af Amer 16 (L) >90 mL/min   Anion gap 18 (H) 5 - 15  POC CBG, ED  Result Value Ref Range   Glucose-Capillary 89 70 - 99 mg/dL  CBG monitoring, ED  Result Value Ref Range   Glucose-Capillary 102 (H) 70 - 99 mg/dL   Ct Abdomen Pelvis Wo Contrast  05/16/2014   CLINICAL DATA:  Diffuse abdominal pain, diarrhea, history of Crohn's disease  EXAM: CT ABDOMEN AND PELVIS WITHOUT CONTRAST  TECHNIQUE: Multidetector CT imaging of the abdomen and pelvis was performed following the standard protocol without IV contrast.  COMPARISON:  10/15/2009  FINDINGS: Lung bases are unremarkable. Sagittal images of the spine shows disc space flattening with vacuum disc phenomenon at L5-S1 level. Minimal anterolisthesis L4 on L5 vertebral body. Small hiatal hernia.  There is a midline of mid abdominal wall ventral hernia containing fat measures 4.7 cm see axial image 24 without evidence  of acute complication. There is a umbilical ventral hernia containing fat measures 3 cm without evidence of acute complication.  The patient is status postcholecystectomy. Unenhanced liver is unremarkable. Unenhanced pancreas, spleen and adrenal glands are unremarkable. Unenhanced kidneys are symmetrical in size. No nephrolithiasis. There is a cyst in midpole of the left kidney posterior aspect measures 1.2 cm.  Atherosclerotic calcifications of abdominal aorta and iliac arteries. No aortic aneurysm.  There is moderate distension of the cecum with gas. Mild to moderate gaseous distension of distal small bowel with gas. There is no evidence of thickening of small bowel or cecal wall. No mesenteric inflammation. Minimal enterocolitis cannot be excluded. Scattered diverticula are noted in right colon. There is no evidence of acute diverticulitis. Some gas is noted within right colon which is partially collapsed. Some colonic gas noted in transverse colon without evidence of significant colonic distension. No distal colonic obstruction. Few diverticula are noted descending colon. No evidence of acute diverticulitis. No calcified calculi are noted within urinary bladder. There is a large cystic lesion just posterior to urinary bladder measures 7.8 x 7.3 cm. This may represent a large urinary bladder diverticulum or a pelvic inclusion cyst correlation with urology exam and cystogram is recommended. Normal retrocecal appendix is partially visualized in axial image 56.  The patient is status post hysterectomy.  No small bowel obstruction.  No ascites or free air.  No adenopathy.  IMPRESSION: 1. There is moderate gaseous distension of the cecum. No pericecal inflammation. Mild to moderate gaseous distension of distal small bowel loops. Although there are no surrounding inflammatory changes and no thickening of cecal or small bowel wall mild enterocolitis cannot be excluded. 2. Few diverticula are noted right colon. No  evidence of acute diverticulitis. 3. No nephrolithiasis.  No hydronephrosis or hydroureter. 4. Abdominal wall and umbilical small ventral hernia containing fat without evidence of acute complication. 5. There is a large cystic structure in right pelvis just posterior to urinary bladder measures 7.8 x 7.3 cm.  This may represent a pelvic inclusion cyst. Less likely a bladder diverticulum. Clinical correlation is necessary.   Electronically Signed   By: Lahoma Crocker M.D.   On: 05/16/2014 20:11      EKG Interpretation None      MDM   Final diagnoses:  Abdominal pain  Renal failure  Nausea vomiting and diarrhea    Patient with nausea, vomiting, diarrhea. She is fairly well-appearing. Check labs, and will reassess.  Moderate leukocytosis to 14.3, given moderate abdominal tenderness, will check CT. CT remarkable for some gaseous distention.  No evidence of diverticulitis, possibly mild enterocolitis. Creatinine is noted to be 3.07, last creatinine was 6 months ago and was 1. Patient will need to be admitted for acute renal failure. Patient given gentle fluid hydration with 500 mL given CHF.  Patient discussed with Dr. Tawnya Crook, who recommends admission for renal failure.  9:03 PM Appreciate Dr. Arnoldo Morale for admission.      Montine Circle, PA-C 05/16/14 2230  Ernestina Patches, MD 05/16/14 [phone]

## 2014-05-16 NOTE — ED Notes (Signed)
Nurse currently starting ultrasound IV

## 2014-05-16 NOTE — ED Notes (Signed)
CBG 89 on arrival.

## 2014-05-16 NOTE — H&P (Signed)
[facility] Admission History and Physical       [name] DGL:[id number] DOB: April 23, 1940 DOA: 05/16/2014  Referring physician:  PCP: No primary care provider on file. Specialists:   Chief Complaint: Malaise Weakness  HPI: Michelle Farrell is a 74 y.o. female with a history of Crohn's disease, Diastolic CHF, HTN, IDDM, and Hyperlipidemia who presents to the ED with complaints of 4 days of N+V+D, along with weakness malaise and low blood sugars.    She has not had any Nausea or vomiting or Diarrhea for the past 24 hours.  She denies any fevers or chills.  Her glucose level was 53 on arrival to the ED and she was given [facility] and her glucose level increased to 11.  She ws evaluated in the ED and had A CT of the ABD which was negative for enterocolitis, but did reveal a Cystic mass in the pelvis posterior to the bladder with dimensions of 7.8 x 7.3 cm .  Her BUN/Cr was elevated at 48/3.07 and 72 months ago  Her creatinine level was 1.05.   She was referred for admission.          Review of Systems:  Constitutional: No Weight Loss, No Weight Gain, Night Sweats, Fevers, Chills, Dizziness, Light Headedness, Fatigue, + Malaise, +Generalized Weakness HEENT: No Headaches, Difficulty Swallowing,Tooth/Dental Problems,Sore Throat,  No Sneezing, Rhinitis, Ear Ache, Nasal Congestion, or Post Nasal Drip,  Cardio-vascular:  No Chest pain, Orthopnea, PND, Edema in Lower Extremities, Anasarca, Dizziness, Palpitations  Resp: No Dyspnea, No DOE, No Productive Cough, No Non-Productive Cough, No Hemoptysis, No Wheezing.    GI: No Heartburn, Indigestion, Abdominal Pain,+Nausea, +Vomiting, +Diarrhea, Constipation, Hematemesis, Hematochezia, Melena, Change in Bowel Habits,  Loss of Appetite  GU: No Dysuria, No Change in Color of Urine, No Urgency or Urinary Frequency, No Flank pain.  Musculoskeletal: No Joint Pain or Swelling, No Decreased Range of Motion, No Back Pain.  Neurologic: No Syncope,  No Seizures, Muscle Weakness, Paresthesia, Vision Disturbance or Loss, No Diplopia, No Vertigo, No Difficulty Walking,  Skin: No Rash or Lesions. Psych: No Change in Mood or Affect, No Depression or Anxiety, No Memory loss, No Confusion, or Hallucinations   Past Medical History  Diagnosis Date  . CHF (congestive heart failure)   . Hypertension   . Back pain, chronic   . Hip pain, chronic   . GERD (gastroesophageal reflux disease)   . Crohn disease   . Diabetes mellitus without complication      Past Surgical History  Procedure Laterality Date  . Abdominal hysterectomy    . Tonsillectomy    . Cholecystectomy    . Bladder surgery        Prior to Admission medications   Medication Sig Start Date End Date Taking? Authorizing Provider  Cholecalciferol (VITAMIN D) 2000 UNITS CAPS Take 1 capsule by mouth daily.   Yes Historical Provider, MD  cloNIDine (CATAPRES) 0.1 MG tablet Take 1 tablet (0.1 mg total) by mouth 2 (two) times daily. 08/31/13  Yes Maryann Mikhail, DO  folic acid (FOLVITE) 1 MG tablet Take 1 mg by mouth daily.   Yes Historical Provider, MD  furosemide (LASIX) 40 MG tablet Take 80 mg by mouth.    Yes Historical Provider, MD  gabapentin (NEURONTIN) 600 MG tablet Take 600 mg by mouth 3 (three) times daily.    Yes Historical Provider, MD  hydrALAZINE (APRESOLINE) 50 MG tablet Take 1 tablet (50 mg total) by mouth every 8 (eight) hours. 08/31/13  Yes  Maryann Mikhail, DO  HYDROcodone-acetaminophen (NORCO) 10-325 MG per tablet Take 1 tablet by mouth every 6 (six) hours as needed (for pain.).    Yes Historical Provider, MD  insulin glargine (LANTUS) 100 UNIT/ML injection Inject 0.5 mLs (50 Units total) into the skin at bedtime. Patient taking differently: Inject 60 Units into the skin at bedtime.  08/31/13  Yes Maryann Mikhail, DO  lisinopril (PRINIVIL,ZESTRIL) 10 MG tablet Take 10 mg by mouth daily.   Yes Historical Provider, MD  metoCLOPramide (REGLAN) 10 MG tablet Take 10 mg by  mouth 4 (four) times daily. 06/29/13  Yes Historical Provider, MD  omeprazole (PRILOSEC) 20 MG capsule Take 20 mg by mouth every morning.    Yes Historical Provider, MD  potassium chloride SA (K-DUR,KLOR-CON) 20 MEQ tablet Take 20 mEq by mouth daily.   Yes Historical Provider, MD  simvastatin (ZOCOR) 40 MG tablet Take 40 mg by mouth at bedtime.     Yes Historical Provider, MD  sitaGLIPtin (JANUVIA) 100 MG tablet Take 50 mg by mouth every morning.    Yes Historical Provider, MD  spironolactone (ALDACTONE) 25 MG tablet Take 25 mg by mouth.     Yes Historical Provider, MD  sulfaSALAzine (AZULFIDINE) 500 MG tablet Take 500 mg by mouth 4 (four) times daily.   Yes Historical Provider, MD  temazepam (RESTORIL) 15 MG capsule Take 15 mg by mouth at bedtime.    Yes Historical Provider, MD  traZODone (DESYREL) 100 MG tablet Take 200 tablets by mouth at bedtime.  04/17/14  Yes Historical Provider, MD  PROAIR HFA 108 (90 BASE) MCG/ACT inhaler Inhale 1 puff into the lungs every 6 (six) hours as needed. Shortness of breath or wheezing 03/03/14   Historical Provider, MD     No Known Allergies  Social History:  reports that she has been smoking.  She has never used smokeless tobacco. She reports that she does not drink alcohol or use illicit drugs.    Family History  Problem Relation Age of Onset  . Diabetes Mellitus II Other        Physical Exam:  GEN:  Pleasant Well Nourished and Well Developed Elderly  74 y.o. African [place] female examined and in no acute distress; cooperative with exam Filed Vitals:   05/16/14 1651 05/16/14 1902 05/16/14 1931 05/16/14 2100  BP: 147/70 166/76 155/63 148/52  Pulse: 91 87  89  Temp: 99.1 F (37.3 C) 97.4 F (36.3 C)    TempSrc: Oral Oral    Resp: 18 18 18 16   SpO2: 98% 100%  98%   Blood pressure 148/52, pulse 89, temperature 97.4 F (36.3 C), temperature source Oral, resp. rate 16, SpO2 98 %. PSYCH: She is alert and oriented x4; does not appear anxious does not  appear depressed; affect is normal HEENT: Normocephalic and Atraumatic, Mucous membranes pink; PERRLA; EOM intact; Fundi:  Benign;  No scleral icterus, Nares: Patent, Oropharynx: Clear,    Neck:  FROM, No Cervical Lymphadenopathy nor Thyromegaly or Carotid Bruit; No JVD; Breasts:: Not examined CHEST WALL: No tenderness CHEST: Normal respiration, clear to auscultation bilaterally HEART: Regular rate and rhythm; no murmurs rubs or gallops BACK: No kyphosis or scoliosis; No CVA tenderness ABDOMEN: Positive Bowel Sounds,  Soft Non-Tender, No Rebound or Guarding; No Masses, No Organomegaly. Rectal Exam: Not done EXTREMITIES: No Cyanosis, Clubbing, or Edema; No Ulcerations. Genitalia: not examined PULSES: 2+ and symmetric SKIN: Normal hydration no rash or ulceration CNS:  Alert and Oriented x 4, No Focal Deficits Vascular:  pulses palpable throughout    Labs on Admission:  Basic Metabolic Panel:  Recent Labs Lab 05/16/14 1804  NA 135  K 4.5  CL 101  CO2 16*  GLUCOSE 110*  BUN 48*  CREATININE 3.07*  CALCIUM 10.8*   Liver Function Tests: No results for input(s): AST, ALT, ALKPHOS, BILITOT, PROT, ALBUMIN in the last 168 hours. No results for input(s): LIPASE, AMYLASE in the last 168 hours. No results for input(s): AMMONIA in the last 168 hours. CBC:  Recent Labs Lab 05/16/14 1804  WBC 14.3*  NEUTROABS 10.3*  HGB 13.4  HCT 38.4  MCV 89.7  PLT 253   Cardiac Enzymes: No results for input(s): CKTOTAL, CKMB, CKMBINDEX, TROPONINI in the last 168 hours.  BNP (last 3 results) No results for input(s): BNP in the last 8760 hours.  ProBNP (last 3 results)  Recent Labs  08/27/13 2205 08/28/13 0454  PROBNP 263.1* 251.0*    CBG:  Recent Labs Lab 05/16/14 1700 05/16/14 1848  GLUCAP 89 102*    Radiological Exams on Admission: Ct Abdomen Pelvis Wo Contrast  05/16/2014   CLINICAL DATA:  Diffuse abdominal pain, diarrhea, history of Crohn's disease  EXAM: CT ABDOMEN AND  PELVIS WITHOUT CONTRAST  TECHNIQUE: Multidetector CT imaging of the abdomen and pelvis was performed following the standard protocol without IV contrast.  COMPARISON:  10/15/2009  FINDINGS: Lung bases are unremarkable. Sagittal images of the spine shows disc space flattening with vacuum disc phenomenon at L5-S1 level. Minimal anterolisthesis L4 on L5 vertebral body. Small hiatal hernia.  There is a midline of mid abdominal wall ventral hernia containing fat measures 4.7 cm see axial image 24 without evidence of acute complication. There is a umbilical ventral hernia containing fat measures 3 cm without evidence of acute complication.  The patient is status postcholecystectomy. Unenhanced liver is unremarkable. Unenhanced pancreas, spleen and adrenal glands are unremarkable. Unenhanced kidneys are symmetrical in size. No nephrolithiasis. There is a cyst in midpole of the left kidney posterior aspect measures 1.2 cm.  Atherosclerotic calcifications of abdominal aorta and iliac arteries. No aortic aneurysm.  There is moderate distension of the cecum with gas. Mild to moderate gaseous distension of distal small bowel with gas. There is no evidence of thickening of small bowel or cecal wall. No mesenteric inflammation. Minimal enterocolitis cannot be excluded. Scattered diverticula are noted in right colon. There is no evidence of acute diverticulitis. Some gas is noted within right colon which is partially collapsed. Some colonic gas noted in transverse colon without evidence of significant colonic distension. No distal colonic obstruction. Few diverticula are noted descending colon. No evidence of acute diverticulitis. No calcified calculi are noted within urinary bladder. There is a large cystic lesion just posterior to urinary bladder measures 7.8 x 7.3 cm. This may represent a large urinary bladder diverticulum or a pelvic inclusion cyst correlation with urology exam and cystogram is recommended. Normal retrocecal  appendix is partially visualized in axial image 56.  The patient is status post hysterectomy.  No small bowel obstruction.  No ascites or free air.  No adenopathy.  IMPRESSION: 1. There is moderate gaseous distension of the cecum. No pericecal inflammation. Mild to moderate gaseous distension of distal small bowel loops. Although there are no surrounding inflammatory changes and no thickening of cecal or small bowel wall mild enterocolitis cannot be excluded. 2. Few diverticula are noted right colon. No evidence of acute diverticulitis. 3. No nephrolithiasis.  No hydronephrosis or hydroureter. 4. Abdominal wall and umbilical  small ventral hernia containing fat without evidence of acute complication. 5. There is a large cystic structure in right pelvis just posterior to urinary bladder measures 7.8 x 7.3 cm. This may represent a pelvic inclusion cyst. Less likely a bladder diverticulum. Clinical correlation is necessary.   Electronically Signed   By: Lahoma Crocker M.D.   On: 05/16/2014 20:11        Assessment/Plan:   74 y.o. female with  Active Problems:   1.   AKI (acute kidney injury)/Dehydration   Hold lasix, Lisinopril, and Spironolactone   IVFs   Monitor BUN/Cr     2.   Pelvic mass- on CT of ABD Pelvis- Discussed with Patient   Needs Referral for Outpatient workup with GYN     3.   Diabetes mellitus   Hold Lantus due to Hypoglycemia \  SSI coverage PRN     4.   Crohn disease   Continue Sulfasalazine Rx     5.   Essential hypertension   Continue Catapres    Hold Lisinopril, Lasix and Aldactone due to #1   PRN IV Hydralazine   Monitor BPs     6.   Diastolic CHF   Monitor for signs of Fluid Overload   Lasix ,Spironolactone, and Lisinopril on hold     7.   Back pain, chronic   PRN Pain Control     8.   DVT Prophylaxis    Lovenox      Code Status:    FULL CODE Family Communication:   No Family Present Disposition Plan:   Observation Med/Surg Unit      Time spent:   [facility] Pager [phone]   If 7AM -7PM Please Contact the Day Rounding Team MD for [facility]  If 7PM-7AM, Please Contact Night-Floor Coverage  [email or web address] Password [email or web address] 05/16/2014, 10:06 PM     ADDENDUM:   Patient was seen and examined on 05/16/2014

## 2014-05-17 DIAGNOSIS — K219 Gastro-esophageal reflux disease without esophagitis: Secondary | ICD-10-CM | POA: Diagnosis present

## 2014-05-17 DIAGNOSIS — Z794 Long term (current) use of insulin: Secondary | ICD-10-CM | POA: Diagnosis not present

## 2014-05-17 DIAGNOSIS — N179 Acute kidney failure, unspecified: Principal | ICD-10-CM

## 2014-05-17 DIAGNOSIS — I10 Essential (primary) hypertension: Secondary | ICD-10-CM | POA: Diagnosis present

## 2014-05-17 DIAGNOSIS — M549 Dorsalgia, unspecified: Secondary | ICD-10-CM | POA: Diagnosis present

## 2014-05-17 DIAGNOSIS — Z79891 Long term (current) use of opiate analgesic: Secondary | ICD-10-CM | POA: Diagnosis not present

## 2014-05-17 DIAGNOSIS — Z79899 Other long term (current) drug therapy: Secondary | ICD-10-CM | POA: Diagnosis not present

## 2014-05-17 DIAGNOSIS — E119 Type 2 diabetes mellitus without complications: Secondary | ICD-10-CM

## 2014-05-17 DIAGNOSIS — I5032 Chronic diastolic (congestive) heart failure: Secondary | ICD-10-CM | POA: Diagnosis present

## 2014-05-17 DIAGNOSIS — R1900 Intra-abdominal and pelvic swelling, mass and lump, unspecified site: Secondary | ICD-10-CM | POA: Diagnosis present

## 2014-05-17 DIAGNOSIS — G8929 Other chronic pain: Secondary | ICD-10-CM | POA: Diagnosis present

## 2014-05-17 DIAGNOSIS — K5090 Crohn's disease, unspecified, without complications: Secondary | ICD-10-CM

## 2014-05-17 DIAGNOSIS — R531 Weakness: Secondary | ICD-10-CM | POA: Diagnosis present

## 2014-05-17 DIAGNOSIS — A084 Viral intestinal infection, unspecified: Secondary | ICD-10-CM | POA: Diagnosis present

## 2014-05-17 DIAGNOSIS — E785 Hyperlipidemia, unspecified: Secondary | ICD-10-CM | POA: Diagnosis present

## 2014-05-17 DIAGNOSIS — E860 Dehydration: Secondary | ICD-10-CM | POA: Diagnosis present

## 2014-05-17 DIAGNOSIS — Z833 Family history of diabetes mellitus: Secondary | ICD-10-CM | POA: Diagnosis not present

## 2014-05-17 DIAGNOSIS — Z90710 Acquired absence of both cervix and uterus: Secondary | ICD-10-CM | POA: Diagnosis not present

## 2014-05-17 DIAGNOSIS — F17210 Nicotine dependence, cigarettes, uncomplicated: Secondary | ICD-10-CM | POA: Diagnosis present

## 2014-05-17 LAB — GLUCOSE, CAPILLARY
GLUCOSE-CAPILLARY: 46 mg/dL — AB (ref 70–99)
GLUCOSE-CAPILLARY: 79 mg/dL (ref 70–99)
GLUCOSE-CAPILLARY: 187 mg/dL — AB (ref 70–99)
GLUCOSE-CAPILLARY: 179 mg/dL — AB (ref 70–99)
Glucose-Capillary: 94 mg/dL (ref 70–99)
Glucose-Capillary: 59 mg/dL — ABNORMAL LOW (ref 70–99)
Glucose-Capillary: 66 mg/dL — ABNORMAL LOW (ref 70–99)
Glucose-Capillary: 163 mg/dL — ABNORMAL HIGH (ref 70–99)
Glucose-Capillary: 109 mg/dL — ABNORMAL HIGH (ref 70–99)

## 2014-05-17 LAB — BASIC METABOLIC PANEL
Anion gap: 8 (ref 5–15)
BUN: 43 mg/dL — ABNORMAL HIGH (ref 6–23)
CALCIUM: 9.6 mg/dL (ref 8.4–10.5)
CO2: 20 mmol/L (ref 19–32)
CREATININE: 2.2 mg/dL — AB (ref 0.50–1.10)
Chloride: 105 mmol/L (ref 96–112)
GFR calc Af Amer: 25 mL/min — ABNORMAL LOW (ref >90)
GFR, EST NON AFRICAN AMERICAN: 21 mL/min — AB (ref >90)
Glucose, Bld: 78 mg/dL (ref 70–99)
POTASSIUM: 3.7 mmol/L (ref 3.5–5.1)
SODIUM: 133 mmol/L — AB (ref 135–145)

## 2014-05-17 LAB — CBC
HCT: 32.6 % — ABNORMAL LOW (ref 36.0–46.0)
Hemoglobin: 11.2 g/dL — ABNORMAL LOW (ref 12.0–15.0)
MCH: 30.9 pg (ref 26.0–34.0)
MCHC: 34.4 g/dL (ref 30.0–36.0)
MCV: 90.1 fL (ref 78.0–100.0)
PLATELETS: 190 10*3/uL (ref 150–400)
RBC: 3.62 MIL/uL — ABNORMAL LOW (ref 3.87–5.11)
RDW: 14.4 % (ref 11.5–15.5)
WBC: 10.9 10*3/uL — ABNORMAL HIGH (ref 4.0–10.5)

## 2014-05-17 MED ORDER — DEXTROSE 50 % IV SOLN
INTRAVENOUS | Status: AC
  Filled 2014-05-17: qty 50

## 2014-05-17 MED ORDER — DEXTROSE-NACL 5-0.9 % IV SOLN
INTRAVENOUS | Status: DC
  Administered 2014-05-17 – 2014-05-20 (×5): via INTRAVENOUS

## 2014-05-17 MED ORDER — DEXTROSE 50 % IV SOLN
25.0000 mL | Freq: Once | INTRAVENOUS | Status: AC
  Administered 2014-05-17: 25 mL via INTRAVENOUS
  Filled 2014-05-17: qty 50

## 2014-05-17 NOTE — Progress Notes (Signed)
Utilization Review completed.  

## 2014-05-17 NOTE — Progress Notes (Signed)
[facility] PROGRESS NOTE  Neya Creegan KGU:[id number] DOB: 02/13/1941 DOA: 05/16/2014 PCP: No primary care provider on file.  Assessment/Plan:  AKI (acute kidney injury) -Most likely prerenal - Serum creatinine trending down -we'll reassess next a.m.  Active Problems:   Diabetes mellitus - SSI on board - diabetic diet    Crohn disease - Pt continue Sulfasalazine    Dehydration -Improved we'll place on D5 normal saline at 75 mL per hour    Pelvic mass -We'll plan on setting up appointment with GYN on discharge - CT reports:There is a large cystic structure in right pelvis just posterior to urinary bladder measures 7.8 x 7.3 cm. This may represent a pelvic inclusion cyst    Hypertension -We'll continue to monitor blood pressures    Diastolic CHF -We'll plan on gently hydrating    Back pain, chronic -Continue supportive therapy  Nausea vomiting - Resolved - CT of abdomen reports moderate gaseous distention of the cecum.  Code Status: full Family Communication: none at bedside Disposition Plan: Pending improvement in condition.   Consultants:  None  Procedures:  None  Antibiotics:  None  HPI/Subjective: The patient states that her nausea has resolved. She feels better  Objective: Filed Vitals:   05/17/14 1425  BP: 129/33  Pulse: 67  Temp: 98.7 F (37.1 C)  Resp: 18    Intake/Output Summary (Last 24 hours) at 05/17/14 1713 Last data filed at 05/17/14 1504  Gross per 24 hour  Intake    760 ml  Output    600 ml  Net    160 ml   Filed Weights   05/16/14 2213  Weight: 76.658 kg (169 lb)    Exam:   General:  Pt in nad, alert and awake  Cardiovascular: rrr, no mrg  Respiratory: cta bl, no wheezes  Abdomen: soft, NT, ND  Musculoskeletal: no cyanosis or clubbing   Data Reviewed: Basic Metabolic Panel:  Recent Labs Lab 05/16/14 1804 05/17/14 0445  NA 135 133*  K 4.5 3.7  CL 101 105  CO2 16* 20  GLUCOSE 110* 78  BUN  48* 43*  CREATININE 3.07* 2.20*  CALCIUM 10.8* 9.6   Liver Function Tests: No results for input(s): AST, ALT, ALKPHOS, BILITOT, PROT, ALBUMIN in the last 168 hours. No results for input(s): LIPASE, AMYLASE in the last 168 hours. No results for input(s): AMMONIA in the last 168 hours. CBC:  Recent Labs Lab 05/16/14 1804 05/17/14 0445  WBC 14.3* 10.9*  NEUTROABS 10.3*  --   HGB 13.4 11.2*  HCT 38.4 32.6*  MCV 89.7 90.1  PLT 253 190   Cardiac Enzymes: No results for input(s): CKTOTAL, CKMB, CKMBINDEX, TROPONINI in the last 168 hours. BNP (last 3 results) No results for input(s): BNP in the last 8760 hours.  ProBNP (last 3 results)  Recent Labs  08/27/13 2205 08/28/13 0454  PROBNP 263.1* 251.0*    CBG:  Recent Labs Lab 05/17/14 0737 05/17/14 0800 05/17/14 0840 05/17/14 1140 05/17/14 1608  GLUCAP 66* 59* 163* 109* 187*    No results found for this or any previous visit (from the past 240 hour(s)).   Studies: Ct Abdomen Pelvis Wo Contrast  05/16/2014   CLINICAL DATA:  Diffuse abdominal pain, diarrhea, history of Crohn's disease  EXAM: CT ABDOMEN AND PELVIS WITHOUT CONTRAST  TECHNIQUE: Multidetector CT imaging of the abdomen and pelvis was performed following the standard protocol without IV contrast.  COMPARISON:  10/15/2009  FINDINGS: Lung bases are unremarkable. Sagittal images of the  spine shows disc space flattening with vacuum disc phenomenon at L5-S1 level. Minimal anterolisthesis L4 on L5 vertebral body. Small hiatal hernia.  There is a midline of mid abdominal wall ventral hernia containing fat measures 4.7 cm see axial image 24 without evidence of acute complication. There is a umbilical ventral hernia containing fat measures 3 cm without evidence of acute complication.  The patient is status postcholecystectomy. Unenhanced liver is unremarkable. Unenhanced pancreas, spleen and adrenal glands are unremarkable. Unenhanced kidneys are symmetrical in size. No  nephrolithiasis. There is a cyst in midpole of the left kidney posterior aspect measures 1.2 cm.  Atherosclerotic calcifications of abdominal aorta and iliac arteries. No aortic aneurysm.  There is moderate distension of the cecum with gas. Mild to moderate gaseous distension of distal small bowel with gas. There is no evidence of thickening of small bowel or cecal wall. No mesenteric inflammation. Minimal enterocolitis cannot be excluded. Scattered diverticula are noted in right colon. There is no evidence of acute diverticulitis. Some gas is noted within right colon which is partially collapsed. Some colonic gas noted in transverse colon without evidence of significant colonic distension. No distal colonic obstruction. Few diverticula are noted descending colon. No evidence of acute diverticulitis. No calcified calculi are noted within urinary bladder. There is a large cystic lesion just posterior to urinary bladder measures 7.8 x 7.3 cm. This may represent a large urinary bladder diverticulum or a pelvic inclusion cyst correlation with urology exam and cystogram is recommended. Normal retrocecal appendix is partially visualized in axial image 56.  The patient is status post hysterectomy.  No small bowel obstruction.  No ascites or free air.  No adenopathy.  IMPRESSION: 1. There is moderate gaseous distension of the cecum. No pericecal inflammation. Mild to moderate gaseous distension of distal small bowel loops. Although there are no surrounding inflammatory changes and no thickening of cecal or small bowel wall mild enterocolitis cannot be excluded. 2. Few diverticula are noted right colon. No evidence of acute diverticulitis. 3. No nephrolithiasis.  No hydronephrosis or hydroureter. 4. Abdominal wall and umbilical small ventral hernia containing fat without evidence of acute complication. 5. There is a large cystic structure in right pelvis just posterior to urinary bladder measures 7.8 x 7.3 cm. This may  represent a pelvic inclusion cyst. Less likely a bladder diverticulum. Clinical correlation is necessary.   Electronically Signed   By: Lahoma Crocker M.D.   On: 05/16/2014 20:11    Scheduled Meds: . cholecalciferol  2,000 Units Oral Daily  . dextrose      . folic acid  1 mg Oral Daily  . gabapentin  600 mg Oral TID  . heparin  5,000 Units Subcutaneous 3 times per day  . insulin aspart  0-9 Units Subcutaneous 6 times per day  . metoCLOPramide  10 mg Oral TID WC & HS  . pantoprazole  40 mg Oral Daily  . simvastatin  40 mg Oral QHS  . sulfaSALAzine  500 mg Oral QID  . traZODone  200 mg Oral QHS   Continuous Infusions: . dextrose 5 % and 0.9% NaCl 50 mL/hr at 05/17/14 0848     Time spent: > 35 minutes    Velvet Bathe  [facility] Pager [phone]. If 7PM-7AM, please contact night-coverage at [email or web address], password [facility] 05/17/2014, 5:13 PM  LOS: 1 day

## 2014-05-18 LAB — CBC
HCT: 31.8 % — ABNORMAL LOW (ref 36.0–46.0)
HEMOGLOBIN: 10.7 g/dL — AB (ref 12.0–15.0)
MCH: 30.4 pg (ref 26.0–34.0)
MCHC: 33.6 g/dL (ref 30.0–36.0)
MCV: 90.3 fL (ref 78.0–100.0)
Platelets: 205 10*3/uL (ref 150–400)
RBC: 3.52 MIL/uL — ABNORMAL LOW (ref 3.87–5.11)
RDW: 14.1 % (ref 11.5–15.5)
WBC: 8.8 10*3/uL (ref 4.0–10.5)

## 2014-05-18 LAB — GLUCOSE, CAPILLARY
GLUCOSE-CAPILLARY: 83 mg/dL (ref 70–99)
GLUCOSE-CAPILLARY: 205 mg/dL — AB (ref 70–99)
Glucose-Capillary: 139 mg/dL — ABNORMAL HIGH (ref 70–99)
Glucose-Capillary: 144 mg/dL — ABNORMAL HIGH (ref 70–99)
Glucose-Capillary: 167 mg/dL — ABNORMAL HIGH (ref 70–99)
Glucose-Capillary: 169 mg/dL — ABNORMAL HIGH (ref 70–99)
Glucose-Capillary: 170 mg/dL — ABNORMAL HIGH (ref 70–99)

## 2014-05-18 LAB — BASIC METABOLIC PANEL
ANION GAP: 4 — AB (ref 5–15)
BUN: 23 mg/dL (ref 6–23)
CO2: 22 mmol/L (ref 19–32)
Calcium: 9.5 mg/dL (ref 8.4–10.5)
Chloride: 110 mmol/L (ref 96–112)
Creatinine, Ser: 1.32 mg/dL — ABNORMAL HIGH (ref 0.50–1.10)
GFR calc non Af Amer: 39 mL/min — ABNORMAL LOW (ref >90)
GFR, EST AFRICAN AMERICAN: 45 mL/min — AB (ref >90)
GLUCOSE: 159 mg/dL — AB (ref 70–99)
Potassium: 4.2 mmol/L (ref 3.5–5.1)
SODIUM: 136 mmol/L (ref 135–145)

## 2014-05-18 MED ORDER — OMEPRAZOLE 20 MG PO CPDR
20.0000 mg | DELAYED_RELEASE_CAPSULE | Freq: Every day | ORAL | Status: DC
  Administered 2014-05-18 – 2014-05-20 (×3): 20 mg via ORAL
  Filled 2014-05-18 (×3): qty 1

## 2014-05-18 MED ORDER — METOCLOPRAMIDE HCL 5 MG/ML IJ SOLN
10.0000 mg | Freq: Three times a day (TID) | INTRAMUSCULAR | Status: DC
  Administered 2014-05-18 – 2014-05-20 (×6): 10 mg via INTRAVENOUS
  Filled 2014-05-18 (×9): qty 2

## 2014-05-18 MED ORDER — FUROSEMIDE 40 MG PO TABS: 40.0000 mg | ORAL_TABLET | Freq: Every day | ORAL | Status: DC

## 2014-05-18 MED ORDER — PROCHLORPERAZINE EDISYLATE 5 MG/ML IJ SOLN
5.0000 mg | Freq: Once | INTRAMUSCULAR | Status: AC | PRN
  Administered 2014-05-18: 5 mg via INTRAVENOUS
  Filled 2014-05-18: qty 2

## 2014-05-18 NOTE — Progress Notes (Signed)
Nutrition Brief Note  Patient identified on the Malnutrition Screening Tool (MST) Report  Pt states she has been attempting to lose weight by decreasing portions. Pt reports good appetite and eating well prior to the 4 days of N/V/D before coming to the hospital.   Pt has had diet education before, RD offered more education if pt desired. Pt not interested at this time.  Wt Readings from Last 15 Encounters:  05/16/14 169 lb (76.658 kg)  08/30/13 193 lb 12.8 oz (87.907 kg)  07/14/13 199 lb (90.266 kg)  11/09/09 219 lb 12.8 oz (99.701 kg)  12/08/08 213 lb (96.616 kg)  11/14/08 216 lb 8 oz (98.204 kg)  02/12/08 192 lb 3.2 oz (87.181 kg)    Body mass index is 26.46 kg/(m^2). Patient meets criteria for overweight based on current BMI.   Current diet order is CHO modified, patient is consuming approximately 100% of meals at this time. Labs and medications reviewed.   No nutrition interventions warranted at this time. If nutrition issues arise, please consult RD.   Clayton Bibles, MS, RD, LDN Pager: [phone] After Hours Pager: [phone]

## 2014-05-18 NOTE — Progress Notes (Signed)
[facility] PROGRESS NOTE  Michelle Farrell VZC:[id number] DOB: 25-May-1940 DOA: 05/16/2014 PCP: No primary care provider on file.  Assessment/Plan:  AKI (acute kidney injury) - Most likely prerenal - Serum creatinine trending down with IV fluids and improved oral intake  Active Problems:   Diabetes mellitus - SSI on board - diabetic diet    Crohn disease - Pt continue Sulfasalazine    Dehydration - Improving but nursing reports patient had one bout of emesis since 3 bouts of diarrhea today. - We'll continue supportive therapy    Pelvic mass  - CT reports:There is a large cystic structure in right pelvis just posterior to urinary bladder measures 7.8 x 7.3 cm. This may represent a pelvic inclusion cyst - Discussed with urologist recommend patient follow-up with primary care physician for further evaluation recommendations.    Hypertension -We'll continue to monitor blood pressures    Diastolic CHF -Holding lisinopril and Lasix due to dehydration nausea vomiting and diarrhea    Back pain, chronic -Continue supportive therapy  Nausea vomiting - Resolving  Code Status: full Family Communication: none at bedside Disposition Plan: Pending improvement in condition.   Consultants:  None  Procedures:  None  Antibiotics:  None  HPI/Subjective: Patient feels better. But nursing reports the patient has had 3 bouts of diarrhea and emesis  Objective: Filed Vitals:   05/18/14 0603  BP: 144/46  Pulse: 66  Temp: 98 F (36.7 C)  Resp: 20    Intake/Output Summary (Last 24 hours) at 05/18/14 1148 Last data filed at 05/18/14 0114  Gross per 24 hour  Intake    720 ml  Output   1300 ml  Net   -580 ml   Filed Weights   05/16/14 2213  Weight: 76.658 kg (169 lb)    Exam:   General:  Pt in nad, alert and awake  Cardiovascular: rrr, no mrg  Respiratory: cta bl, no wheezes  Abdomen: soft, NT, ND  Musculoskeletal: no cyanosis or clubbing   Data  Reviewed: Basic Metabolic Panel:  Recent Labs Lab 05/16/14 1804 05/17/14 0445 05/18/14 0433  NA 135 133* 136  K 4.5 3.7 4.2  CL 101 105 110  CO2 16* 20 22  GLUCOSE 110* 78 159*  BUN 48* 43* 23  CREATININE 3.07* 2.20* 1.32*  CALCIUM 10.8* 9.6 9.5   Liver Function Tests: No results for input(s): AST, ALT, ALKPHOS, BILITOT, PROT, ALBUMIN in the last 168 hours. No results for input(s): LIPASE, AMYLASE in the last 168 hours. No results for input(s): AMMONIA in the last 168 hours. CBC:  Recent Labs Lab 05/16/14 1804 05/17/14 0445 05/18/14 0433  WBC 14.3* 10.9* 8.8  NEUTROABS 10.3*  --   --   HGB 13.4 11.2* 10.7*  HCT 38.4 32.6* 31.8*  MCV 89.7 90.1 90.3  PLT 253 190 205   Cardiac Enzymes: No results for input(s): CKTOTAL, CKMB, CKMBINDEX, TROPONINI in the last 168 hours. BNP (last 3 results) No results for input(s): BNP in the last 8760 hours.  ProBNP (last 3 results)  Recent Labs  08/27/13 2205 08/28/13 0454  PROBNP 263.1* 251.0*    CBG:  Recent Labs Lab 05/17/14 1608 05/17/14 2041 05/18/14 05/18/14 0516 05/18/14 0805  GLUCAP 187* 179* 139* 169* 83    No results found for this or any previous visit (from the past 240 hour(s)).   Studies: Ct Abdomen Pelvis Wo Contrast  05/16/2014   CLINICAL DATA:  Diffuse abdominal pain, diarrhea, history of Crohn's disease  EXAM: CT ABDOMEN  AND PELVIS WITHOUT CONTRAST  TECHNIQUE: Multidetector CT imaging of the abdomen and pelvis was performed following the standard protocol without IV contrast.  COMPARISON:  10/15/2009  FINDINGS: Lung bases are unremarkable. Sagittal images of the spine shows disc space flattening with vacuum disc phenomenon at L5-S1 level. Minimal anterolisthesis L4 on L5 vertebral body. Small hiatal hernia.  There is a midline of mid abdominal wall ventral hernia containing fat measures 4.7 cm see axial image 24 without evidence of acute complication. There is a umbilical ventral hernia containing fat  measures 3 cm without evidence of acute complication.  The patient is status postcholecystectomy. Unenhanced liver is unremarkable. Unenhanced pancreas, spleen and adrenal glands are unremarkable. Unenhanced kidneys are symmetrical in size. No nephrolithiasis. There is a cyst in midpole of the left kidney posterior aspect measures 1.2 cm.  Atherosclerotic calcifications of abdominal aorta and iliac arteries. No aortic aneurysm.  There is moderate distension of the cecum with gas. Mild to moderate gaseous distension of distal small bowel with gas. There is no evidence of thickening of small bowel or cecal wall. No mesenteric inflammation. Minimal enterocolitis cannot be excluded. Scattered diverticula are noted in right colon. There is no evidence of acute diverticulitis. Some gas is noted within right colon which is partially collapsed. Some colonic gas noted in transverse colon without evidence of significant colonic distension. No distal colonic obstruction. Few diverticula are noted descending colon. No evidence of acute diverticulitis. No calcified calculi are noted within urinary bladder. There is a large cystic lesion just posterior to urinary bladder measures 7.8 x 7.3 cm. This may represent a large urinary bladder diverticulum or a pelvic inclusion cyst correlation with urology exam and cystogram is recommended. Normal retrocecal appendix is partially visualized in axial image 56.  The patient is status post hysterectomy.  No small bowel obstruction.  No ascites or free air.  No adenopathy.  IMPRESSION: 1. There is moderate gaseous distension of the cecum. No pericecal inflammation. Mild to moderate gaseous distension of distal small bowel loops. Although there are no surrounding inflammatory changes and no thickening of cecal or small bowel wall mild enterocolitis cannot be excluded. 2. Few diverticula are noted right colon. No evidence of acute diverticulitis. 3. No nephrolithiasis.  No hydronephrosis or  hydroureter. 4. Abdominal wall and umbilical small ventral hernia containing fat without evidence of acute complication. 5. There is a large cystic structure in right pelvis just posterior to urinary bladder measures 7.8 x 7.3 cm. This may represent a pelvic inclusion cyst. Less likely a bladder diverticulum. Clinical correlation is necessary.   Electronically Signed   By: Lahoma Crocker M.D.   On: 05/16/2014 20:11    Scheduled Meds: . cholecalciferol  2,000 Units Oral Daily  . folic acid  1 mg Oral Daily  . gabapentin  600 mg Oral TID  . heparin  5,000 Units Subcutaneous 3 times per day  . insulin aspart  0-9 Units Subcutaneous 6 times per day  . metoCLOPramide  10 mg Oral TID WC & HS  . pantoprazole  40 mg Oral Daily  . simvastatin  40 mg Oral QHS  . sulfaSALAzine  500 mg Oral QID  . traZODone  200 mg Oral QHS   Continuous Infusions: . dextrose 5 % and 0.9% NaCl 75 mL/hr at 05/17/14 2123     Time spent: > 35 minutes    Velvet Bathe  [facility] Pager [phone]. If 7PM-7AM, please contact night-coverage at [email or web address], password [facility] 05/18/2014, 11:48 AM  LOS: 2 days

## 2014-05-18 NOTE — Discharge Summary (Addendum)
Physician Discharge Summary  Aymee Fomby TWS:[id number] DOB: 06/04/1940 DOA: 05/16/2014  PCP: No primary care provider on file.  Admit date: 05/16/2014 Discharge date: 05/18/2014  Time spent: > 35 minutes  Recommendations for Outpatient Follow-up:  1.  Please reassess hemoglobin levels 2. Also reassess serum creatinine levels 3. Held lisinopril given recent elevated serum creatinine please decide when to continue 4. With continued improvement in oral intake and have advised patient to start taking her Lasix  05/21/2014 5. Patient also found to have cystic structure behind bladder, discussed with urology who recommended cystogram for further evaluation. Please review CT scan of abdomen and pelvis on 05/16/2014 and consider to monitor  Discharge Diagnoses:  Active Problems:   Diabetes mellitus   Crohn disease   AKI (acute kidney injury)   Dehydration   Pelvic mass   Hypertension   Essential hypertension   Diastolic CHF   Back pain, chronic   Discharge Condition: Stable  Diet recommendation: Carb modified diet  Filed Weights   05/16/14 2213  Weight: 76.658 kg (169 lb)    History of present illness:  From original history of present illness 74 y.o. female with a history of Crohn's disease, Diastolic CHF, HTN, IDDM, and Hyperlipidemia who presents to the ED with complaints of 4 days of N+V+D, along with weakness malaise and low blood sugars. She has not had any Nausea or vomiting or Diarrhea for the past 24 hours. She denies any fevers or chills. Her glucose level was 53 on arrival to the ED and she was given [facility]ice and her glucose level increased to 11. She ws evaluated in the ED and had A CT of the ABD which was negative for enterocolitis, but did reveal a Cystic mass in the pelvis posterior to the bladder with dimensions of 7.8 x 7.3 cm . Her BUN/Cr was elevated at 48/3.07 and 6 months ago Her creatinine level was 1.05. She was referred for admission.    Hospital Course:  Pelvic mass - Discussed with urology who recommends patient follow-up with primary care physician for further evaluation recommendations particularly consideration to cystogram.  Nausea vomiting diarrhea - CT scan of the abdomen and pelvis reported no surrounding inflammatory changes and no thickening of cecal or small bowel wall. - Patient initially had elevated white blood cell count which trended down with no antibiotics. I suspect most likely patient had a viral gastroenteritis. Currently she feels better is on diet carb modified diet and denies any nausea or abdominal discomfort  Acute renal failure - Improved after holding lisinopril and Lasix. I suspect this was most likely prerenal etiology given poor oral intake secondary to nausea vomiting. Also worsened by diarrhea. Nausea vomiting and diarrhea has since resolved and after IV fluids and improved oral intake serum creatinine improved. Will hold lisinopril on discharge and will defer continuation to primary care physician. Lasix to be continued the Wednesday after discharge  Weakness - Discussed with patient whether she wanted physical therapy or further evaluation of which she declined. She states she has help at home  DM - Patient has had low blood sugars despite being on sliding scale insulin. This has resolved in the last 24 hours with improved oral intake. Patient is on Lantus at home. She has not currently been on Lantus while in the hospital her blood sugars have been well controlled off of Lantus. I will discontinue the Lantus on discharge. I will have the patient continue Januvia. We'll recommend she check her blood sugars at least  2 times daily fasting and postprandial. Should her blood sugars be consistently elevated above 200 she is to call her primary care physician for further evaluation recommendations.  CHF - Please see discussion above regarding lisinopril and  Lasix  Procedures:  None  Consultations:  None  Discharge Exam: Filed Vitals:   05/18/14 0603  BP: 144/46  Pulse: 66  Temp: 98 F (36.7 C)  Resp: 20    General: Patient in no acute distress, alert and awake Cardiovascular: Regular rate and rhythm, no murmurs rubs Respiratory: Clear to auscultation bilaterally, no wheezes  Discharge Instructions   Discharge Instructions    Call MD for:  difficulty breathing, headache or visual disturbances    Complete by:  As directed      Call MD for:  severe uncontrolled pain    Complete by:  As directed      Call MD for:  temperature >100.4    Complete by:  As directed      Diet - low sodium heart healthy    Complete by:  As directed      Discharge instructions    Complete by:  As directed   Please follow-up with your primary care physician within the next one or 2 weeks and have him decide whether or not to continue her lisinopril. Also with continued improvement in your oral intake you can resume your Lasix on 05/21/2014. As mentioned above please follow-up with your primary care physician within the next one or 2 weeks or sooner should any new concerns arise.     Increase activity slowly    Complete by:  As directed           Current Discharge Medication List    CONTINUE these medications which have CHANGED   Details  furosemide (LASIX) 40 MG tablet Take 1 tablet (40 mg total) by mouth daily. Qty: 30 tablet      CONTINUE these medications which have NOT CHANGED   Details  Cholecalciferol (VITAMIN D) 2000 UNITS CAPS Take 1 capsule by mouth daily.    cloNIDine (CATAPRES) 0.1 MG tablet Take 1 tablet (0.1 mg total) by mouth 2 (two) times daily. Qty: 60 tablet, Refills: 11    folic acid (FOLVITE) 1 MG tablet Take 1 mg by mouth daily.    gabapentin (NEURONTIN) 600 MG tablet Take 600 mg by mouth 3 (three) times daily.     hydrALAZINE (APRESOLINE) 50 MG tablet Take 1 tablet (50 mg total) by mouth every 8 (eight)  hours. Qty: 90 tablet, Refills: 0    HYDROcodone-acetaminophen (NORCO) 10-325 MG per tablet Take 1 tablet by mouth every 6 (six) hours as needed (for pain.).     metoCLOPramide (REGLAN) 10 MG tablet Take 10 mg by mouth 4 (four) times daily.    omeprazole (PRILOSEC) 20 MG capsule Take 20 mg by mouth every morning.     potassium chloride SA (K-DUR,KLOR-CON) 20 MEQ tablet Take 20 mEq by mouth daily.    simvastatin (ZOCOR) 40 MG tablet Take 40 mg by mouth at bedtime.      sitaGLIPtin (JANUVIA) 100 MG tablet Take 50 mg by mouth every morning.     spironolactone (ALDACTONE) 25 MG tablet Take 25 mg by mouth.      sulfaSALAzine (AZULFIDINE) 500 MG tablet Take 500 mg by mouth 4 (four) times daily.    temazepam (RESTORIL) 15 MG capsule Take 15 mg by mouth at bedtime.     traZODone (DESYREL) 100 MG tablet Take 200 tablets  by mouth at bedtime.  Refills: 0    PROAIR HFA 108 (90 BASE) MCG/ACT inhaler Inhale 1 puff into the lungs every 6 (six) hours as needed. Shortness of breath or wheezing Refills: 3      STOP taking these medications     insulin glargine (LANTUS) 100 UNIT/ML injection      lisinopril (PRINIVIL,ZESTRIL) 10 MG tablet        No Known Allergies    The results of significant diagnostics from this hospitalization (including imaging, microbiology, ancillary and laboratory) are listed below for reference.    Significant Diagnostic Studies: Ct Abdomen Pelvis Wo Contrast  05/16/2014   CLINICAL DATA:  Diffuse abdominal pain, diarrhea, history of Crohn's disease  EXAM: CT ABDOMEN AND PELVIS WITHOUT CONTRAST  TECHNIQUE: Multidetector CT imaging of the abdomen and pelvis was performed following the standard protocol without IV contrast.  COMPARISON:  10/15/2009  FINDINGS: Lung bases are unremarkable. Sagittal images of the spine shows disc space flattening with vacuum disc phenomenon at L5-S1 level. Minimal anterolisthesis L4 on L5 vertebral body. Small hiatal hernia.  There is a  midline of mid abdominal wall ventral hernia containing fat measures 4.7 cm see axial image 24 without evidence of acute complication. There is a umbilical ventral hernia containing fat measures 3 cm without evidence of acute complication.  The patient is status postcholecystectomy. Unenhanced liver is unremarkable. Unenhanced pancreas, spleen and adrenal glands are unremarkable. Unenhanced kidneys are symmetrical in size. No nephrolithiasis. There is a cyst in midpole of the left kidney posterior aspect measures 1.2 cm.  Atherosclerotic calcifications of abdominal aorta and iliac arteries. No aortic aneurysm.  There is moderate distension of the cecum with gas. Mild to moderate gaseous distension of distal small bowel with gas. There is no evidence of thickening of small bowel or cecal wall. No mesenteric inflammation. Minimal enterocolitis cannot be excluded. Scattered diverticula are noted in right colon. There is no evidence of acute diverticulitis. Some gas is noted within right colon which is partially collapsed. Some colonic gas noted in transverse colon without evidence of significant colonic distension. No distal colonic obstruction. Few diverticula are noted descending colon. No evidence of acute diverticulitis. No calcified calculi are noted within urinary bladder. There is a large cystic lesion just posterior to urinary bladder measures 7.8 x 7.3 cm. This may represent a large urinary bladder diverticulum or a pelvic inclusion cyst correlation with urology exam and cystogram is recommended. Normal retrocecal appendix is partially visualized in axial image 56.  The patient is status post hysterectomy.  No small bowel obstruction.  No ascites or free air.  No adenopathy.  IMPRESSION: 1. There is moderate gaseous distension of the cecum. No pericecal inflammation. Mild to moderate gaseous distension of distal small bowel loops. Although there are no surrounding inflammatory changes and no thickening of  cecal or small bowel wall mild enterocolitis cannot be excluded. 2. Few diverticula are noted right colon. No evidence of acute diverticulitis. 3. No nephrolithiasis.  No hydronephrosis or hydroureter. 4. Abdominal wall and umbilical small ventral hernia containing fat without evidence of acute complication. 5. There is a large cystic structure in right pelvis just posterior to urinary bladder measures 7.8 x 7.3 cm. This may represent a pelvic inclusion cyst. Less likely a bladder diverticulum. Clinical correlation is necessary.   Electronically Signed   By: Lahoma Crocker M.D.   On: 05/16/2014 20:11    Microbiology: No results found for this or any previous visit (from the past  240 hour(s)).   Labs: Basic Metabolic Panel:  Recent Labs Lab 05/16/14 1804 05/17/14 0445 05/18/14 0433  NA 135 133* 136  K 4.5 3.7 4.2  CL 101 105 110  CO2 16* 20 22  GLUCOSE 110* 78 159*  BUN 48* 43* 23  CREATININE 3.07* 2.20* 1.32*  CALCIUM 10.8* 9.6 9.5   Liver Function Tests: No results for input(s): AST, ALT, ALKPHOS, BILITOT, PROT, ALBUMIN in the last 168 hours. No results for input(s): LIPASE, AMYLASE in the last 168 hours. No results for input(s): AMMONIA in the last 168 hours. CBC:  Recent Labs Lab 05/16/14 1804 05/17/14 0445 05/18/14 0433  WBC 14.3* 10.9* 8.8  NEUTROABS 10.3*  --   --   HGB 13.4 11.2* 10.7*  HCT 38.4 32.6* 31.8*  MCV 89.7 90.1 90.3  PLT 253 190 205   Cardiac Enzymes: No results for input(s): CKTOTAL, CKMB, CKMBINDEX, TROPONINI in the last 168 hours. BNP: BNP (last 3 results) No results for input(s): BNP in the last 8760 hours.  ProBNP (last 3 results)  Recent Labs  08/27/13 2205 08/28/13 0454  PROBNP 263.1* 251.0*    CBG:  Recent Labs Lab 05/17/14 1608 05/17/14 2041 05/18/14 05/18/14 0516 05/18/14 0805  GLUCAP 187* 179* 139* 169* 83       Signed:  Velvet Bathe  [facility] 05/18/2014, 11:16 AM

## 2014-05-19 DIAGNOSIS — R1900 Intra-abdominal and pelvic swelling, mass and lump, unspecified site: Secondary | ICD-10-CM

## 2014-05-19 DIAGNOSIS — E860 Dehydration: Secondary | ICD-10-CM

## 2014-05-19 LAB — HEMOGLOBIN A1C
Hgb A1c MFr Bld: 4.8 % (ref 4.8–5.6)
MEAN PLASMA GLUCOSE: 91 mg/dL

## 2014-05-19 LAB — GLUCOSE, CAPILLARY
GLUCOSE-CAPILLARY: 145 mg/dL — AB (ref 70–99)
GLUCOSE-CAPILLARY: 158 mg/dL — AB (ref 70–99)
GLUCOSE-CAPILLARY: 171 mg/dL — AB (ref 70–99)
Glucose-Capillary: 138 mg/dL — ABNORMAL HIGH (ref 70–99)
Glucose-Capillary: 128 mg/dL — ABNORMAL HIGH (ref 70–99)
Glucose-Capillary: 154 mg/dL — ABNORMAL HIGH (ref 70–99)

## 2014-05-19 NOTE — Progress Notes (Signed)
[facility] PROGRESS NOTE  Michelle Farrell TFT:[id number] DOB: Mar 24, 1941 DOA: 05/16/2014 PCP: No primary care provider on file.  Assessment/Plan:  AKI (acute kidney injury) - Most likely prerenal - Serum creatinine trending down with IV fluids and improved oral intake  Active Problems:   Diabetes mellitus - SSI on board - diabetic diet    Crohn disease - Pt continue Sulfasalazine    Dehydration - Resolving     Pelvic mass - CT reports:There is a large cystic structure in right pelvis just posterior to urinary bladder measures 7.8 x 7.3 cm. This may represent a pelvic inclusion cyst - Discussed with urologist recommend patient follow-up with primary care physician for further evaluation recommendations.    Hypertension -We'll continue to monitor blood pressures    Diastolic CHF -Holding lisinopril and Lasix due to dehydration nausea vomiting and diarrhea    Back pain, chronic -Continue supportive therapy  Nausea vomiting - Resolving  Code Status: full Family Communication: none at bedside Disposition Plan: Pending improvement in condition.   Consultants:  None  Procedures:  None  Antibiotics:  None  HPI/Subjective: Patient resting comfortably today. Family reports that diarrhea has subsided  Objective: Filed Vitals:   05/19/14 1316  BP: 166/50  Pulse: 72  Temp: 99 F (37.2 C)  Resp: 18    Intake/Output Summary (Last 24 hours) at 05/19/14 1632 Last data filed at 05/19/14 1316  Gross per 24 hour  Intake      0 ml  Output   1000 ml  Net  -1000 ml   Filed Weights   05/16/14 2213  Weight: 76.658 kg (169 lb)    Exam:   General:  Pt in nad, alert and awake  Cardiovascular: rrr, no mrg  Respiratory: cta bl, no wheezes  Abdomen: soft, NT, ND  Musculoskeletal: no cyanosis or clubbing   Data Reviewed: Basic Metabolic Panel:  Recent Labs Lab 05/16/14 1804 05/17/14 0445 05/18/14 0433  NA 135 133* 136  K 4.5 3.7 4.2  CL  101 105 110  CO2 16* 20 22  GLUCOSE 110* 78 159*  BUN 48* 43* 23  CREATININE 3.07* 2.20* 1.32*  CALCIUM 10.8* 9.6 9.5   Liver Function Tests: No results for input(s): AST, ALT, ALKPHOS, BILITOT, PROT, ALBUMIN in the last 168 hours. No results for input(s): LIPASE, AMYLASE in the last 168 hours. No results for input(s): AMMONIA in the last 168 hours. CBC:  Recent Labs Lab 05/16/14 1804 05/17/14 0445 05/18/14 0433  WBC 14.3* 10.9* 8.8  NEUTROABS 10.3*  --   --   HGB 13.4 11.2* 10.7*  HCT 38.4 32.6* 31.8*  MCV 89.7 90.1 90.3  PLT 253 190 205   Cardiac Enzymes: No results for input(s): CKTOTAL, CKMB, CKMBINDEX, TROPONINI in the last 168 hours. BNP (last 3 results) No results for input(s): BNP in the last 8760 hours.  ProBNP (last 3 results)  Recent Labs  08/27/13 2205 08/28/13 0454  PROBNP 263.1* 251.0*    CBG:  Recent Labs Lab 05/18/14 2034 05/18/14 2349 05/19/14 0431 05/19/14 0750 05/19/14 1205  GLUCAP 205* 170* 171* 138* 128*    No results found for this or any previous visit (from the past 240 hour(s)).   Studies: No results found.  Scheduled Meds: . cholecalciferol  2,000 Units Oral Daily  . folic acid  1 mg Oral Daily  . gabapentin  600 mg Oral TID  . heparin  5,000 Units Subcutaneous 3 times per day  . insulin aspart  0-9 Units  Subcutaneous 6 times per day  . metoCLOPramide (REGLAN) injection  10 mg Intravenous 3 times per day  . omeprazole  20 mg Oral Daily  . simvastatin  40 mg Oral QHS  . sulfaSALAzine  500 mg Oral QID  . traZODone  200 mg Oral QHS   Continuous Infusions: . dextrose 5 % and 0.9% NaCl 75 mL/hr at 05/19/14 1434     Time spent: > 35 minutes    Velvet Bathe  [facility] Pager [phone]. If 7PM-7AM, please contact night-coverage at [email or web address], password [facility] 05/19/2014, 4:32 PM  LOS: 3 days

## 2014-05-20 LAB — BASIC METABOLIC PANEL
Anion gap: 5 (ref 5–15)
BUN: 7 mg/dL (ref 6–23)
CALCIUM: 9.9 mg/dL (ref 8.4–10.5)
CO2: 23 mmol/L (ref 19–32)
CREATININE: 1.02 mg/dL (ref 0.50–1.10)
Chloride: 111 mmol/L (ref 96–112)
GFR calc Af Amer: 62 mL/min — ABNORMAL LOW (ref >90)
GFR, EST NON AFRICAN AMERICAN: 54 mL/min — AB (ref >90)
GLUCOSE: 137 mg/dL — AB (ref 70–99)
Potassium: 4 mmol/L (ref 3.5–5.1)
Sodium: 139 mmol/L (ref 135–145)

## 2014-05-20 LAB — GLUCOSE, CAPILLARY
GLUCOSE-CAPILLARY: 124 mg/dL — AB (ref 70–99)
Glucose-Capillary: 135 mg/dL — ABNORMAL HIGH (ref 70–99)
Glucose-Capillary: 157 mg/dL — ABNORMAL HIGH (ref 70–99)
Glucose-Capillary: 143 mg/dL — ABNORMAL HIGH (ref 70–99)

## 2014-06-11 ENCOUNTER — Encounter (HOSPITAL_COMMUNITY): Payer: Self-pay | Admitting: Emergency Medicine

## 2014-06-11 ENCOUNTER — Observation Stay (HOSPITAL_COMMUNITY)
Admission: EM | Admit: 2014-06-11 | Discharge: 2014-06-16 | Disposition: A | Discharge status: Home / Self Care | Payer: Medicare Other | Attending: Family Medicine | Admitting: Family Medicine

## 2014-06-11 DIAGNOSIS — K529 Noninfective gastroenteritis and colitis, unspecified: Secondary | ICD-10-CM | POA: Diagnosis not present

## 2014-06-11 DIAGNOSIS — E119 Type 2 diabetes mellitus without complications: Secondary | ICD-10-CM

## 2014-06-11 DIAGNOSIS — B370 Candidal stomatitis: Secondary | ICD-10-CM | POA: Diagnosis present

## 2014-06-11 DIAGNOSIS — Z79899 Other long term (current) drug therapy: Secondary | ICD-10-CM | POA: Insufficient documentation

## 2014-06-11 DIAGNOSIS — K219 Gastro-esophageal reflux disease without esophagitis: Secondary | ICD-10-CM | POA: Diagnosis not present

## 2014-06-11 DIAGNOSIS — I5030 Unspecified diastolic (congestive) heart failure: Secondary | ICD-10-CM | POA: Diagnosis not present

## 2014-06-11 DIAGNOSIS — I10 Essential (primary) hypertension: Secondary | ICD-10-CM | POA: Diagnosis not present

## 2014-06-11 DIAGNOSIS — M25559 Pain in unspecified hip: Secondary | ICD-10-CM | POA: Diagnosis not present

## 2014-06-11 DIAGNOSIS — E872 Acidosis, unspecified: Secondary | ICD-10-CM

## 2014-06-11 DIAGNOSIS — N179 Acute kidney failure, unspecified: Secondary | ICD-10-CM | POA: Diagnosis not present

## 2014-06-11 DIAGNOSIS — R7989 Other specified abnormal findings of blood chemistry: Secondary | ICD-10-CM

## 2014-06-11 DIAGNOSIS — R112 Nausea with vomiting, unspecified: Secondary | ICD-10-CM

## 2014-06-11 DIAGNOSIS — K5090 Crohn's disease, unspecified, without complications: Secondary | ICD-10-CM | POA: Diagnosis present

## 2014-06-11 DIAGNOSIS — M549 Dorsalgia, unspecified: Secondary | ICD-10-CM | POA: Diagnosis not present

## 2014-06-11 DIAGNOSIS — R197 Diarrhea, unspecified: Secondary | ICD-10-CM

## 2014-06-11 DIAGNOSIS — G8929 Other chronic pain: Secondary | ICD-10-CM | POA: Diagnosis not present

## 2014-06-11 DIAGNOSIS — F17210 Nicotine dependence, cigarettes, uncomplicated: Secondary | ICD-10-CM | POA: Insufficient documentation

## 2014-06-11 NOTE — ED Notes (Signed)
Pt states she has been having diarrhea since Monday evening and started vomiting today  Pt was seen by her dr today and was told she had thrush and was given pepto bismol  Pt stated vomiting after her dr office today  Family called the dr back and was told to bring pt here for further evaluation and lab tests to check for dehydration  Pt had an elevated creatinine a few weeks ago

## 2014-06-12 DIAGNOSIS — E872 Acidosis, unspecified: Secondary | ICD-10-CM | POA: Diagnosis present

## 2014-06-12 DIAGNOSIS — B370 Candidal stomatitis: Secondary | ICD-10-CM

## 2014-06-12 DIAGNOSIS — E119 Type 2 diabetes mellitus without complications: Secondary | ICD-10-CM

## 2014-06-12 DIAGNOSIS — E118 Type 2 diabetes mellitus with unspecified complications: Secondary | ICD-10-CM

## 2014-06-12 DIAGNOSIS — K50919 Crohn's disease, unspecified, with unspecified complications: Secondary | ICD-10-CM

## 2014-06-12 DIAGNOSIS — K5090 Crohn's disease, unspecified, without complications: Secondary | ICD-10-CM | POA: Diagnosis present

## 2014-06-12 DIAGNOSIS — I10 Essential (primary) hypertension: Secondary | ICD-10-CM

## 2014-06-12 DIAGNOSIS — I5032 Chronic diastolic (congestive) heart failure: Secondary | ICD-10-CM

## 2014-06-12 DIAGNOSIS — N179 Acute kidney failure, unspecified: Principal | ICD-10-CM

## 2014-06-12 DIAGNOSIS — K529 Noninfective gastroenteritis and colitis, unspecified: Secondary | ICD-10-CM

## 2014-06-12 LAB — URINALYSIS, ROUTINE W REFLEX MICROSCOPIC
GLUCOSE, UA: NEGATIVE mg/dL
Hgb urine dipstick: NEGATIVE
Ketones, ur: 15 mg/dL — AB
Leukocytes, UA: NEGATIVE
Nitrite: NEGATIVE
Protein, ur: NEGATIVE mg/dL
Specific Gravity, Urine: 1.014 (ref 1.005–1.030)
UROBILINOGEN UA: 0.2 mg/dL (ref 0.0–1.0)
pH: 5 (ref 5.0–8.0)

## 2014-06-12 LAB — CBC
HCT: 37.2 % (ref 36.0–46.0)
Hemoglobin: 12.9 g/dL (ref 12.0–15.0)
MCH: 30.9 pg (ref 26.0–34.0)
MCHC: 34.7 g/dL (ref 30.0–36.0)
MCV: 89 fL (ref 78.0–100.0)
PLATELETS: 242 10*3/uL (ref 150–400)
RBC: 4.18 MIL/uL (ref 3.87–5.11)
RDW: 14.4 % (ref 11.5–15.5)
WBC: 13.9 10*3/uL — ABNORMAL HIGH (ref 4.0–10.5)

## 2014-06-12 LAB — COMPREHENSIVE METABOLIC PANEL
ALBUMIN: 5 g/dL (ref 3.5–5.2)
ALK PHOS: 78 U/L (ref 39–117)
ALT: 12 U/L (ref 0–35)
ANION GAP: 19 — AB (ref 5–15)
AST: 22 U/L (ref 0–37)
BUN: 15 mg/dL (ref 6–23)
CALCIUM: 11.1 mg/dL — AB (ref 8.4–10.5)
CO2: 13 mmol/L — AB (ref 19–32)
CREATININE: 1.98 mg/dL — AB (ref 0.50–1.10)
Chloride: 105 mmol/L (ref 96–112)
GFR calc non Af Amer: 24 mL/min — ABNORMAL LOW (ref >90)
GFR, EST AFRICAN AMERICAN: 28 mL/min — AB (ref >90)
Glucose, Bld: 192 mg/dL — ABNORMAL HIGH (ref 70–99)
Potassium: 4.7 mmol/L (ref 3.5–5.1)
SODIUM: 137 mmol/L (ref 135–145)
Total Bilirubin: 1 mg/dL (ref 0.3–1.2)
Total Protein: 7.8 g/dL (ref 6.0–8.3)

## 2014-06-12 LAB — BASIC METABOLIC PANEL
Anion gap: 17 — ABNORMAL HIGH (ref 5–15)
BUN: 15 mg/dL (ref 6–23)
CO2: 11 mmol/L — ABNORMAL LOW (ref 19–32)
Calcium: 9.6 mg/dL (ref 8.4–10.5)
Chloride: 110 mmol/L (ref 96–112)
Creatinine, Ser: 1.7 mg/dL — ABNORMAL HIGH (ref 0.50–1.10)
GFR calc Af Amer: 34 mL/min — ABNORMAL LOW (ref >90)
GFR, EST NON AFRICAN AMERICAN: 29 mL/min — AB (ref >90)
Glucose, Bld: 164 mg/dL — ABNORMAL HIGH (ref 70–99)
Potassium: 4.4 mmol/L (ref 3.5–5.1)
Sodium: 138 mmol/L (ref 135–145)

## 2014-06-12 LAB — BLOOD GAS, ARTERIAL
Acid-base deficit: 14.4 mmol/L — ABNORMAL HIGH (ref 0.0–2.0)
BICARBONATE: 8.8 meq/L — AB (ref 20.0–24.0)
DRAWN BY: 422461
FIO2: 0.21 %
O2 Saturation: 93 %
Patient temperature: 98
TCO2: 7.9 mmol/L (ref 0–100)
pCO2 arterial: 15.3 mmHg — CL (ref 35.0–45.0)
pH, Arterial: 7.378 (ref 7.350–7.450)
pO2, Arterial: 82.2 mmHg (ref 80.0–100.0)

## 2014-06-12 LAB — CBG MONITORING, ED
GLUCOSE-CAPILLARY: 162 mg/dL — AB (ref 70–99)
Glucose-Capillary: 160 mg/dL — ABNORMAL HIGH (ref 70–99)

## 2014-06-12 LAB — CBC WITH DIFFERENTIAL/PLATELET
BASOS ABS: 0 10*3/uL (ref 0.0–0.1)
Basophils Relative: 0 % (ref 0–1)
Eosinophils Absolute: 0 10*3/uL (ref 0.0–0.7)
Eosinophils Relative: 0 % (ref 0–5)
HCT: 42.3 % (ref 36.0–46.0)
Hemoglobin: 14.5 g/dL (ref 12.0–15.0)
Lymphocytes Relative: 8 % — ABNORMAL LOW (ref 12–46)
Lymphs Abs: 1 10*3/uL (ref 0.7–4.0)
MCH: 31 pg (ref 26.0–34.0)
MCHC: 34.3 g/dL (ref 30.0–36.0)
MCV: 90.4 fL (ref 78.0–100.0)
MONO ABS: 0.6 10*3/uL (ref 0.1–1.0)
Monocytes Relative: 5 % (ref 3–12)
NEUTROS PCT: 87 % — AB (ref 43–77)
Neutro Abs: 10 10*3/uL — ABNORMAL HIGH (ref 1.7–7.7)
PLATELETS: 266 10*3/uL (ref 150–400)
RBC: 4.68 MIL/uL (ref 3.87–5.11)
RDW: 14.9 % (ref 11.5–15.5)
WBC: 11.5 10*3/uL — ABNORMAL HIGH (ref 4.0–10.5)

## 2014-06-12 LAB — GLUCOSE, CAPILLARY
GLUCOSE-CAPILLARY: 145 mg/dL — AB (ref 70–99)
Glucose-Capillary: 126 mg/dL — ABNORMAL HIGH (ref 70–99)

## 2014-06-12 LAB — I-STAT CG4 LACTIC ACID, ED: LACTIC ACID, VENOUS: 3.39 mmol/L — AB (ref 0.5–2.0)

## 2014-06-12 MED ORDER — SODIUM CHLORIDE 0.9 % IV SOLN
1000.0000 mL | Freq: Once | INTRAVENOUS | Status: AC
  Administered 2014-06-12: 1000 mL via INTRAVENOUS

## 2014-06-12 MED ORDER — POTASSIUM CHLORIDE CRYS ER 20 MEQ PO TBCR
20.0000 meq | EXTENDED_RELEASE_TABLET | Freq: Every day | ORAL | Status: DC
  Administered 2014-06-12 – 2014-06-16 (×5): 20 meq via ORAL
  Filled 2014-06-12 (×5): qty 1

## 2014-06-12 MED ORDER — CLONIDINE HCL 0.1 MG PO TABS
0.1000 mg | ORAL_TABLET | Freq: Two times a day (BID) | ORAL | Status: DC
  Administered 2014-06-12 – 2014-06-16 (×9): 0.1 mg via ORAL
  Filled 2014-06-12 (×9): qty 1

## 2014-06-12 MED ORDER — INSULIN ASPART 100 UNIT/ML ~~LOC~~ SOLN: 0.0000 [IU] | Freq: Every day | SUBCUTANEOUS | Status: DC

## 2014-06-12 MED ORDER — FLUCONAZOLE 200 MG PO TABS: 200.0000 mg | ORAL_TABLET | Freq: Every day | ORAL | Status: DC

## 2014-06-12 MED ORDER — ALUM & MAG HYDROXIDE-SIMETH 200-200-20 MG/5ML PO SUSP
30.0000 mL | Freq: Four times a day (QID) | ORAL | Status: DC | PRN
  Administered 2014-06-15: 30 mL via ORAL
  Filled 2014-06-12: qty 30

## 2014-06-12 MED ORDER — SODIUM CHLORIDE 0.9 % IV SOLN
INTRAVENOUS | Status: DC
  Administered 2014-06-12 – 2014-06-13 (×5): via INTRAVENOUS

## 2014-06-12 MED ORDER — ONDANSETRON HCL 4 MG/2ML IJ SOLN
4.0000 mg | Freq: Once | INTRAMUSCULAR | Status: AC
  Administered 2014-06-12: 4 mg via INTRAVENOUS
  Filled 2014-06-12: qty 2

## 2014-06-12 MED ORDER — FOLIC ACID 1 MG PO TABS
1.0000 mg | ORAL_TABLET | Freq: Every day | ORAL | Status: DC
  Administered 2014-06-12 – 2014-06-16 (×5): 1 mg via ORAL
  Filled 2014-06-12 (×5): qty 1

## 2014-06-12 MED ORDER — HYDRALAZINE HCL 50 MG PO TABS
50.0000 mg | ORAL_TABLET | Freq: Three times a day (TID) | ORAL | Status: DC
  Administered 2014-06-12 – 2014-06-16 (×12): 50 mg via ORAL
  Filled 2014-06-12 (×15): qty 1

## 2014-06-12 MED ORDER — ONDANSETRON HCL 4 MG/2ML IJ SOLN
4.0000 mg | Freq: Four times a day (QID) | INTRAMUSCULAR | Status: DC | PRN
  Administered 2014-06-12 – 2014-06-15 (×7): 4 mg via INTRAVENOUS
  Filled 2014-06-12 (×7): qty 2

## 2014-06-12 MED ORDER — ENOXAPARIN SODIUM 30 MG/0.3ML ~~LOC~~ SOLN
30.0000 mg | SUBCUTANEOUS | Status: DC
  Administered 2014-06-12 – 2014-06-13 (×2): 30 mg via SUBCUTANEOUS
  Filled 2014-06-12 (×2): qty 0.3

## 2014-06-12 MED ORDER — ONDANSETRON HCL 4 MG PO TABS
4.0000 mg | ORAL_TABLET | Freq: Four times a day (QID) | ORAL | Status: DC | PRN
  Administered 2014-06-15: 4 mg via ORAL
  Filled 2014-06-12 (×2): qty 1

## 2014-06-12 MED ORDER — ACETAMINOPHEN 325 MG PO TABS
650.0000 mg | ORAL_TABLET | Freq: Four times a day (QID) | ORAL | Status: DC | PRN
  Administered 2014-06-13: 650 mg via ORAL
  Filled 2014-06-12: qty 2

## 2014-06-12 MED ORDER — ACETAMINOPHEN 650 MG RE SUPP: 650.0000 mg | Freq: Four times a day (QID) | RECTAL | Status: DC | PRN

## 2014-06-12 MED ORDER — SODIUM CHLORIDE 0.9 % IJ SOLN
3.0000 mL | Freq: Two times a day (BID) | INTRAMUSCULAR | Status: DC
  Administered 2014-06-14 – 2014-06-15 (×3): 3 mL via INTRAVENOUS

## 2014-06-12 MED ORDER — TRAZODONE HCL 100 MG PO TABS
20000.0000 mg | ORAL_TABLET | Freq: Every day | ORAL | Status: DC
  Filled 2014-06-12: qty 200

## 2014-06-12 MED ORDER — GABAPENTIN 300 MG PO CAPS
600.0000 mg | ORAL_CAPSULE | Freq: Three times a day (TID) | ORAL | Status: DC
  Administered 2014-06-12 – 2014-06-16 (×13): 600 mg via ORAL
  Filled 2014-06-12 (×13): qty 2

## 2014-06-12 MED ORDER — OXYCODONE HCL 5 MG PO TABS: 5.0000 mg | ORAL_TABLET | ORAL | Status: DC | PRN

## 2014-06-12 MED ORDER — ATORVASTATIN CALCIUM 10 MG PO TABS
20.0000 mg | ORAL_TABLET | Freq: Every day | ORAL | Status: DC
  Administered 2014-06-12 – 2014-06-15 (×4): 20 mg via ORAL
  Filled 2014-06-12: qty 1
  Filled 2014-06-12 (×4): qty 2

## 2014-06-12 MED ORDER — FLUCONAZOLE 100 MG PO TABS
200.0000 mg | ORAL_TABLET | Freq: Every day | ORAL | Status: AC
  Administered 2014-06-12 – 2014-06-14 (×3): 200 mg via ORAL
  Filled 2014-06-12: qty 1
  Filled 2014-06-12 (×2): qty 2

## 2014-06-12 MED ORDER — HYDROMORPHONE HCL 1 MG/ML IJ SOLN: 0.5000 mg | INTRAMUSCULAR | Status: DC | PRN

## 2014-06-12 MED ORDER — METOCLOPRAMIDE HCL 10 MG PO TABS
10.0000 mg | ORAL_TABLET | Freq: Four times a day (QID) | ORAL | Status: DC
  Administered 2014-06-12 – 2014-06-16 (×16): 10 mg via ORAL
  Filled 2014-06-12 (×16): qty 1

## 2014-06-12 MED ORDER — SIMVASTATIN 40 MG PO TABS: 40.0000 mg | ORAL_TABLET | Freq: Every day | ORAL | Status: DC

## 2014-06-12 MED ORDER — SULFASALAZINE 500 MG PO TABS
500.0000 mg | ORAL_TABLET | Freq: Four times a day (QID) | ORAL | Status: DC
  Administered 2014-06-12 – 2014-06-16 (×15): 500 mg via ORAL
  Filled 2014-06-12 (×22): qty 1

## 2014-06-12 MED ORDER — INSULIN ASPART 100 UNIT/ML ~~LOC~~ SOLN
0.0000 [IU] | Freq: Three times a day (TID) | SUBCUTANEOUS | Status: DC
  Administered 2014-06-13 – 2014-06-14 (×2): 3 [IU] via SUBCUTANEOUS
  Administered 2014-06-14: 2 [IU] via SUBCUTANEOUS
  Administered 2014-06-14 – 2014-06-15 (×2): 1 [IU] via SUBCUTANEOUS
  Administered 2014-06-15 – 2014-06-16 (×3): 2 [IU] via SUBCUTANEOUS
  Filled 2014-06-12: qty 1

## 2014-06-12 MED ORDER — TRAZODONE HCL 50 MG PO TABS
200.0000 mg | ORAL_TABLET | Freq: Every day | ORAL | Status: DC
  Administered 2014-06-12 – 2014-06-15 (×4): 200 mg via ORAL
  Filled 2014-06-12 (×4): qty 4

## 2014-06-12 MED ORDER — SODIUM CHLORIDE 0.9 % IV SOLN
1000.0000 mL | INTRAVENOUS | Status: DC
  Administered 2014-06-12: 1000 mL via INTRAVENOUS

## 2014-06-12 MED ORDER — ONDANSETRON 4 MG PO TBDP
4.0000 mg | ORAL_TABLET | Freq: Once | ORAL | Status: AC
  Administered 2014-06-12: 4 mg via ORAL
  Filled 2014-06-12: qty 1

## 2014-06-12 NOTE — Progress Notes (Signed)
UR completed 

## 2014-06-12 NOTE — H&P (Signed)
[facility] Admission History and Physical       Sariyah Corcino HQI:[id number] DOB: February 06, 1941 DOA: 06/11/2014  Referring physician: EDP PCP: Hermelinda Medicus, MD  Specialists:   Chief Complaint:   HPI: Mella Inclan is a 74 y.o. female     HPI: Grayson White is a 74 y.o. female with a history of Crohn's disease, Diastolic CHF, HTN, IDDM, and Hyperlipidemia who presents to the ED with complaints of 2 days of Diarrhea.  She denies any fevers or chills. She saw her PCP today and was began to have N+V, and they contacted her PCP and she was referred t the ED.   She was found to have hypercalcemia in the ED of 11.1, and and elevated BUN/Cr and she was referred for medical admission.     Review of Systems:  Constitutional: No Weight Loss, No Weight Gain, Night Sweats, Fevers, Chills, Dizziness, Light Headedness, Fatigue, or Generalized Weakness HEENT: No Headaches, Difficulty Swallowing,Tooth/Dental Problems,Sore Throat,  No Sneezing, Rhinitis, Ear Ache, Nasal Congestion, or Post Nasal Drip,  Cardio-vascular:  No Chest pain, Orthopnea, PND, Edema in Lower Extremities, Anasarca, Dizziness, Palpitations  Resp: No Dyspnea, No DOE, No Productive Cough, No Non-Productive Cough, No Hemoptysis, No Wheezing.    GI: No Heartburn, Indigestion, Abdominal Pain, +Nausea, +Vomiting, Diarrhea, Constipation, Hematemesis, Hematochezia, Melena, Change in Bowel Habits,  Loss of Appetite  GU: No Dysuria, No Change in Color of Urine, No Urgency or Urinary Frequency, No Flank pain.  Musculoskeletal: No Joint Pain or Swelling, No Decreased Range of Motion, No Back Pain.  Neurologic: No Syncope, No Seizures, Muscle Weakness, Paresthesia, Vision Disturbance or Loss, No Diplopia, No Vertigo, No Difficulty Walking,  Skin: No Rash or Lesions. Psych: No Change in Mood or Affect, No Depression or Anxiety, No Memory loss, No Confusion, or Hallucinations   Past Medical History  Diagnosis Date  . CHF  (congestive heart failure)   . Hypertension   . Back pain, chronic   . Hip pain, chronic   . GERD (gastroesophageal reflux disease)   . Crohn disease   . Diabetes mellitus without complication      Past Surgical History  Procedure Laterality Date  . Abdominal hysterectomy    . Tonsillectomy    . Cholecystectomy    . Bladder surgery        Prior to Admission medications   Medication Sig Start Date End Date Taking? Authorizing Provider  Cholecalciferol (VITAMIN D) 2000 UNITS CAPS Take 1 capsule by mouth daily.   Yes Historical Provider, MD  cloNIDine (CATAPRES) 0.1 MG tablet Take 1 tablet (0.1 mg total) by mouth 2 (two) times daily. 08/31/13  Yes Maryann Mikhail, DO  folic acid (FOLVITE) 1 MG tablet Take 1 mg by mouth daily.   Yes Historical Provider, MD  gabapentin (NEURONTIN) 600 MG tablet Take 600 mg by mouth 3 (three) times daily.    Yes Historical Provider, MD  hydrALAZINE (APRESOLINE) 50 MG tablet Take 1 tablet (50 mg total) by mouth every 8 (eight) hours. 08/31/13  Yes Maryann Mikhail, DO  HYDROcodone-acetaminophen (NORCO) 10-325 MG per tablet Take 1 tablet by mouth every 6 (six) hours as needed (for pain.).    Yes Historical Provider, MD  metoCLOPramide (REGLAN) 10 MG tablet Take 10 mg by mouth 4 (four) times daily. 06/29/13  Yes Historical Provider, MD  omeprazole (PRILOSEC) 20 MG capsule Take 20 mg by mouth every morning.    Yes Historical Provider, MD  potassium chloride SA (K-DUR,KLOR-CON) 20 MEQ tablet Take 20 mEq  by mouth daily.   Yes Historical Provider, MD  simvastatin (ZOCOR) 40 MG tablet Take 40 mg by mouth at bedtime.     Yes Historical Provider, MD  sitaGLIPtin (JANUVIA) 100 MG tablet Take 50 mg by mouth every morning.    Yes Historical Provider, MD  spironolactone (ALDACTONE) 25 MG tablet Take 25 mg by mouth every morning.    Yes Historical Provider, MD  sulfaSALAzine (AZULFIDINE) 500 MG tablet Take 500 mg by mouth 4 (four) times daily.   Yes Historical Provider, MD    traZODone (DESYREL) 100 MG tablet Take 200 tablets by mouth at bedtime.  04/17/14  Yes Historical Provider, MD  furosemide (LASIX) 40 MG tablet Take 1 tablet (40 mg total) by mouth daily. Patient not taking: Reported on 06/11/2014 05/21/14   Velvet Bathe, MD  PROAIR HFA 108 (90 BASE) MCG/ACT inhaler Inhale 1 puff into the lungs every 6 (six) hours as needed. Shortness of breath or wheezing 03/03/14   Historical Provider, MD     No Known Allergies  Social History:  reports that she has been smoking.  She has never used smokeless tobacco. She reports that she does not drink alcohol or use illicit drugs.    Family History  Problem Relation Age of Onset  . Diabetes Mellitus II Other        Physical Exam:  GEN:  Pleasant Elderly Thin  73 y.o. African American  female examined and in no acute distress; cooperative with exam Filed Vitals:   06/11/14 2317 06/12/14 0124 06/12/14 0142  BP: 152/77 182/59 158/62  Pulse: 105 98 88  Temp: 98 F (36.7 C)    TempSrc: Oral    Resp: 18 16 20   SpO2: 100% 100% 100%   Blood pressure 158/62, pulse 88, temperature 98 F (36.7 C), temperature source Oral, resp. rate 20, SpO2 100 %. PSYCH: She is alert and oriented x4; does not appear anxious does not appear depressed; affect is normal HEENT: Normocephalic and Atraumatic, Mucous membranes pink; PERRLA; EOM intact; Fundi:  Benign;  No scleral icterus, Nares: Patent, Oropharynx: Clear, Edentulous,    Neck:  FROM, No Cervical Lymphadenopathy nor Thyromegaly or Carotid Bruit; No JVD; Breasts:: Not examined CHEST WALL: No tenderness CHEST: Normal respiration, clear to auscultation bilaterally HEART: Regular rate and rhythm; no murmurs rubs or gallops BACK: No kyphosis or scoliosis; No CVA tenderness ABDOMEN: Positive Bowel Sounds, Soft Non-Tender, No Rebound or Guarding; No Masses, No Organomegaly Rectal Exam: Not done EXTREMITIES: No  Cyanosis, Clubbing, or Edema; No Ulcerations. Genitalia: not  examined PULSES: 2+ and symmetric SKIN: Normal hydration no rash or ulceration CNS:  Alert and Oriented x 4, No Focal Deficits Vascular: pulses palpable throughout    Labs on Admission:  Basic Metabolic Panel:  Recent Labs Lab 06/11/14 2334  NA 137  K 4.7  CL 105  CO2 13*  GLUCOSE 192*  BUN 15  CREATININE 1.98*  CALCIUM 11.1*   Liver Function Tests:  Recent Labs Lab 06/11/14 2334  AST 22  ALT 12  ALKPHOS 78  BILITOT 1.0  PROT 7.8  ALBUMIN 5.0   No results for input(s): LIPASE, AMYLASE in the last 168 hours. No results for input(s): AMMONIA in the last 168 hours. CBC:  Recent Labs Lab 06/11/14 2334  WBC 11.5*  NEUTROABS 10.0*  HGB 14.5  HCT 42.3  MCV 90.4  PLT 266   Cardiac Enzymes: No results for input(s): CKTOTAL, CKMB, CKMBINDEX, TROPONINI in the last 168 hours.  BNP (last  3 results) No results for input(s): BNP in the last 8760 hours.  ProBNP (last 3 results)  Recent Labs  08/27/13 2205 08/28/13 0454  PROBNP 263.1* 251.0*    CBG: No results for input(s): GLUCAP in the last 168 hours.  Radiological Exams on Admission: No results found.   EKG: Independently reviewed.    Assessment/Plan:   74 y.o. female with  Principal Problem:   1.   AKI (acute kidney injury)   IVFs    Monitor BUN/Cr Trend   Active Problems:   2.   Acute gastroenteritis vs Crohn's disease Flare   PRN IV Zofran for N+V   Continue Sulfasalazine     3.   Metabolic acidosis   IVFs     4.   Hypercalcemia   Gentle IVFS   Moniotr Calcium Levels     5.   Essential hypertension     6.   Diastolic CHF   Monitor for Signs of Fluid Overload     7.   DM (diabetes mellitus)   SSI covergae PRN      8.   Oral candidiasis   Oral Fluconazole ordered     9.   DVT Prophylaxis   SQ Heparin          Code Status:     FULL CODE      Family Communication:   Family at Bedside      Disposition Plan:    Inpatient Status        Time spent:  [facility] Pager [phone]   If [place] Please Contact the Day Rounding Team MD for [facility]  If 7PM-7AM, Please Contact Night-Floor Coverage  [email or web address] Password [email or web address] 06/12/2014, 5:07 AM     ADDENDUM:   Patient was seen and examined on 06/12/2014

## 2014-06-12 NOTE — ED Notes (Signed)
Pt stated she is unable to urinate Pt says she just have not been able to pee

## 2014-06-12 NOTE — Progress Notes (Signed)
Patient seen and evaluated earlier this am by my associate. Please refer to H and P for details regarding assessment and plan.  Will reassess next am. Patient was resting comfortably and family at bedside reported no new medical concerns.  Tiona Ruane, [employer]

## 2014-06-12 NOTE — ED Notes (Signed)
Pt is actively vomiting at this time. Will administer IV antiemetic as per PRN orders.

## 2014-06-12 NOTE — ED Provider Notes (Signed)
CSN: [id number]     Arrival date & time 06/11/14  2249 History   First MD Initiated Contact with Patient 06/12/14 [phone]     Chief Complaint  Patient presents with  . Emesis  . Diarrhea     (Consider location/radiation/quality/duration/timing/severity/associated sxs/prior Treatment) Patient is a 74 y.o. female presenting with vomiting and diarrhea. The history is provided by the patient.  Emesis Associated symptoms: diarrhea   Diarrhea Associated symptoms: vomiting   She has been having diarrhea for the last 3 days. Diarrhea is described as mucoid and liquid. She denies any blood in the stool. She started vomiting this evening and has vomited several times. She denies fever or chills but has had sweats. She is also complaining of pain in her left lower abdomen which is moderately she rates it at 5/10. Of note, she was recently hospitalized for dehydration. She did see her PCP who told her that she had thrush and gave her some prescriptions including Pepto-Bismol. She has not been able to get those prescriptions filled.  Past Medical History  Diagnosis Date  . CHF (congestive heart failure)   . Hypertension   . Back pain, chronic   . Hip pain, chronic   . GERD (gastroesophageal reflux disease)   . Crohn disease   . Diabetes mellitus without complication    Past Surgical History  Procedure Laterality Date  . Abdominal hysterectomy    . Tonsillectomy    . Cholecystectomy    . Bladder surgery     Family History  Problem Relation Age of Onset  . Diabetes Mellitus II Other    History  Substance Use Topics  . Smoking status: Current Every Day Smoker  . Smokeless tobacco: Never Used  . Alcohol Use: No   OB History    No data available     Review of Systems  Gastrointestinal: Positive for vomiting and diarrhea.  All other systems reviewed and are negative.     Allergies  Review of patient's allergies indicates no known allergies.  Home Medications   Prior to  Admission medications   Medication Sig Start Date End Date Taking? Authorizing Provider  Cholecalciferol (VITAMIN D) 2000 UNITS CAPS Take 1 capsule by mouth daily.   Yes Historical Provider, MD  cloNIDine (CATAPRES) 0.1 MG tablet Take 1 tablet (0.1 mg total) by mouth 2 (two) times daily. 08/31/13  Yes Maryann Mikhail, DO  folic acid (FOLVITE) 1 MG tablet Take 1 mg by mouth daily.   Yes Historical Provider, MD  gabapentin (NEURONTIN) 600 MG tablet Take 600 mg by mouth 3 (three) times daily.    Yes Historical Provider, MD  hydrALAZINE (APRESOLINE) 50 MG tablet Take 1 tablet (50 mg total) by mouth every 8 (eight) hours. 08/31/13  Yes Maryann Mikhail, DO  HYDROcodone-acetaminophen (NORCO) 10-325 MG per tablet Take 1 tablet by mouth every 6 (six) hours as needed (for pain.).    Yes Historical Provider, MD  metoCLOPramide (REGLAN) 10 MG tablet Take 10 mg by mouth 4 (four) times daily. 06/29/13  Yes Historical Provider, MD  omeprazole (PRILOSEC) 20 MG capsule Take 20 mg by mouth every morning.    Yes Historical Provider, MD  potassium chloride SA (K-DUR,KLOR-CON) 20 MEQ tablet Take 20 mEq by mouth daily.   Yes Historical Provider, MD  simvastatin (ZOCOR) 40 MG tablet Take 40 mg by mouth at bedtime.     Yes Historical Provider, MD  sitaGLIPtin (JANUVIA) 100 MG tablet Take 50 mg by mouth every morning.  Yes Historical Provider, MD  spironolactone (ALDACTONE) 25 MG tablet Take 25 mg by mouth every morning.    Yes Historical Provider, MD  sulfaSALAzine (AZULFIDINE) 500 MG tablet Take 500 mg by mouth 4 (four) times daily.   Yes Historical Provider, MD  traZODone (DESYREL) 100 MG tablet Take 200 tablets by mouth at bedtime.  04/17/14  Yes Historical Provider, MD  furosemide (LASIX) 40 MG tablet Take 1 tablet (40 mg total) by mouth daily. Patient not taking: Reported on 06/11/2014 05/21/14   Velvet Bathe, MD  PROAIR HFA 108 (90 BASE) MCG/ACT inhaler Inhale 1 puff into the lungs every 6 (six) hours as needed. Shortness  of breath or wheezing 03/03/14   Historical Provider, MD   BP 158/62 mmHg  Pulse 88  Temp(Src) 98 F (36.7 C) (Oral)  Resp 20  SpO2 100% Physical Exam  Nursing note and vitals reviewed.  74 year old female, resting comfortably and in no acute distress. Vital signs are significant for hypertension. Oxygen saturation is 100%, which is normal. Head is normocephalic and atraumatic. PERRLA, EOMI. Oropharynx is clear. Neck is nontender and supple without adenopathy or JVD. Back is nontender and there is no CVA tenderness. Lungs are clear without rales, wheezes, or rhonchi. Chest is nontender. Heart has regular rate and rhythm without murmur. Abdomen is soft, flat, nontender without masses or hepatosplenomegaly and peristalsis is hypoactive. Extremities have no cyanosis or edema, full range of motion is present. Skin is warm and dry without rash. Neurologic: Mental status is normal, cranial nerves are intact, there are no motor or sensory deficits.  ED Course  Procedures (including critical care time) Labs Review Results for orders placed or performed during the hospital encounter of 06/11/14  CBC with Differential  Result Value Ref Range   WBC 11.5 (H) 4.0 - 10.5 K/uL   RBC 4.68 3.87 - 5.11 MIL/uL   Hemoglobin 14.5 12.0 - 15.0 g/dL   HCT 42.3 36.0 - 46.0 %   MCV 90.4 78.0 - 100.0 fL   MCH 31.0 26.0 - 34.0 pg   MCHC 34.3 30.0 - 36.0 g/dL   RDW 14.9 11.5 - 15.5 %   Platelets 266 150 - 400 K/uL   Neutrophils Relative % 87 (H) 43 - 77 %   Neutro Abs 10.0 (H) 1.7 - 7.7 K/uL   Lymphocytes Relative 8 (L) 12 - 46 %   Lymphs Abs 1.0 0.7 - 4.0 K/uL   Monocytes Relative 5 3 - 12 %   Monocytes Absolute 0.6 0.1 - 1.0 K/uL   Eosinophils Relative 0 0 - 5 %   Eosinophils Absolute 0.0 0.0 - 0.7 K/uL   Basophils Relative 0 0 - 1 %   Basophils Absolute 0.0 0.0 - 0.1 K/uL  Comprehensive metabolic panel  Result Value Ref Range   Sodium 137 135 - 145 mmol/L   Potassium 4.7 3.5 - 5.1 mmol/L    Chloride 105 96 - 112 mmol/L   CO2 13 (L) 19 - 32 mmol/L   Glucose, Bld 192 (H) 70 - 99 mg/dL   BUN 15 6 - 23 mg/dL   Creatinine, Ser 1.98 (H) 0.50 - 1.10 mg/dL   Calcium 11.1 (H) 8.4 - 10.5 mg/dL   Total Protein 7.8 6.0 - 8.3 g/dL   Albumin 5.0 3.5 - 5.2 g/dL   AST 22 0 - 37 U/L   ALT 12 0 - 35 U/L   Alkaline Phosphatase 78 39 - 117 U/L   Total Bilirubin 1.0 0.3 -  1.2 mg/dL   GFR calc non Af Amer 24 (L) >90 mL/min   GFR calc Af Amer 28 (L) >90 mL/min   Anion gap 19 (H) 5 - 15  Urinalysis, Routine w reflex microscopic  Result Value Ref Range   Color, Urine AMBER (A) YELLOW   APPearance CLEAR CLEAR   Specific Gravity, Urine 1.014 1.005 - 1.030   pH 5.0 5.0 - 8.0   Glucose, UA NEGATIVE NEGATIVE mg/dL   Hgb urine dipstick NEGATIVE NEGATIVE   Bilirubin Urine MODERATE (A) NEGATIVE   Ketones, ur 15 (A) NEGATIVE mg/dL   Protein, ur NEGATIVE NEGATIVE mg/dL   Urobilinogen, UA 0.2 0.0 - 1.0 mg/dL   Nitrite NEGATIVE NEGATIVE   Leukocytes, UA NEGATIVE NEGATIVE  Blood gas, arterial  Result Value Ref Range   FIO2 0.21 %   pH, Arterial 7.378 7.350 - 7.450   pCO2 arterial 15.3 (LL) 35.0 - 45.0 mmHg   pO2, Arterial 82.2 80.0 - 100.0 mmHg   Bicarbonate 8.8 (L) 20.0 - 24.0 mEq/L   TCO2 7.9 0 - 100 mmol/L   Acid-base deficit 14.4 (H) 0.0 - 2.0 mmol/L   O2 Saturation 93.0 %   Patient temperature 98.0    Collection site LEFT RADIAL    Drawn by [id number]    Sample type ARTERIAL DRAW    Allens test (pass/fail) PASS PASS  I-Stat CG4 Lactic Acid, ED  Result Value Ref Range   Lactic Acid, Venous 3.39 (HH) 0.5 - 2.0 mmol/L    MDM   Final diagnoses:  Acute kidney injury  Metabolic acidosis  Hypercalcemia  Nausea vomiting and diarrhea  Elevated lactic acid level    Nausea, vomiting, diarrhea. Laboratory workup shows significant rise in hemoglobin and also development of hypercalcemia. It is unclear at this point whether the hypercalcemia is related to her symptoms or secondary to vomiting.  She was noted to have milder hypercalcemia while hospitalized recently. Creatinine level is lower than its peak when she was hospitalized recently but has approximately doubled. She is also noted to have a metabolic acidosis with mildly elevated anion gap. She does have a history of ketoacidosis and it is possible that this represents ketoacidosis. She is given IV hydration and will need to be readmitted. ABG has been ordered. Case is discussed with Dr. Arnoldo Morale of [facility] who agrees to admit the patient.  ABG shows compensated metabolic acidosis. Lactic acid is mildly elevated but not true degree sufficient to account for the acidosis noted on metabolic panel. She probably has mixed metabolic acidosis.  Delora Fuel, MD 91/[id number]

## 2014-06-12 NOTE — ED Notes (Signed)
Bed: QA44 Expected date:  Expected time:  Means of arrival:  Comments: [place]

## 2014-06-12 NOTE — ED Notes (Addendum)
Notified RN, [place] pt. i-stat Lactic acid 3.39.

## 2014-06-12 NOTE — ED Notes (Signed)
Pt c/o Nausea Diflucan held at the present until nausea improves

## 2014-06-12 NOTE — ED Notes (Signed)
Pt is still unable to give a urine specimen

## 2014-06-12 NOTE — ED Notes (Signed)
Pt states she is unable to give urine sample at this time

## 2014-06-13 LAB — GLUCOSE, CAPILLARY
GLUCOSE-CAPILLARY: 144 mg/dL — AB (ref 70–99)
GLUCOSE-CAPILLARY: 246 mg/dL — AB (ref 70–99)
Glucose-Capillary: 144 mg/dL — ABNORMAL HIGH (ref 70–99)
Glucose-Capillary: 192 mg/dL — ABNORMAL HIGH (ref 70–99)

## 2014-06-13 LAB — BASIC METABOLIC PANEL
ANION GAP: 15 (ref 5–15)
BUN: 12 mg/dL (ref 6–23)
CO2: 16 mmol/L — ABNORMAL LOW (ref 19–32)
CREATININE: 1.19 mg/dL — AB (ref 0.50–1.10)
Calcium: 10.5 mg/dL (ref 8.4–10.5)
Chloride: 112 mmol/L (ref 96–112)
GFR calc Af Amer: 52 mL/min — ABNORMAL LOW (ref >90)
GFR, EST NON AFRICAN AMERICAN: 44 mL/min — AB (ref >90)
Glucose, Bld: 154 mg/dL — ABNORMAL HIGH (ref 70–99)
POTASSIUM: 4 mmol/L (ref 3.5–5.1)
Sodium: 143 mmol/L (ref 135–145)

## 2014-06-13 MED ORDER — ENOXAPARIN SODIUM 40 MG/0.4ML ~~LOC~~ SOLN
40.0000 mg | SUBCUTANEOUS | Status: DC
  Administered 2014-06-14 – 2014-06-16 (×3): 40 mg via SUBCUTANEOUS
  Filled 2014-06-13 (×3): qty 0.4

## 2014-06-13 NOTE — Progress Notes (Signed)
[facility] PROGRESS NOTE  Michelle Farrell JGO:[id number] DOB: Nov 20, 1940 DOA: 06/11/2014 PCP: Hermelinda Medicus, MD  Assessment/Plan: Principal Problem:   AKI (acute kidney injury) - Improved after IV fluid rehydration -   Active Problems:   Essential hypertension -Continue current regimen clonidine, hydralazine. If blood pressures remain consistently elevated we'll plan on increasing antihypertensive regimen    Diastolic CHF - Compensated currently    Acute gastroenteritis - Suspected patient has A viral gastroenteritis is most likely causing current symptoms.    Crohn's disease - Suspect Crohn's flare or less likely. There is no reports of bloody diarrhea  - Continue home medication regimen    DM (diabetes mellitus) - Once able will advance diet to diabetic diet currently on clear liquid diet -Continue sliding scale insulin    Metabolic acidosis - Resolving with improvement in renal function    Code Status: Full Family Communication: None at bedside Disposition Plan: Pending continued improvement in condition   Consultants:  None  Procedures:  None  Antibiotics:  None  HPI/Subjective: Patient has no new complaints. No acute issues reported overnight  Objective: Filed Vitals:   06/13/14 1500  BP: 175/64  Pulse: 81  Temp:   Resp:     Intake/Output Summary (Last 24 hours) at 06/13/14 1554 Last data filed at 06/13/14 0900  Gross per 24 hour  Intake 2111.25 ml  Output    501 ml  Net 1610.25 ml   Filed Weights   06/13/14 0500  Weight: 71.895 kg (158 lb 8 oz)    Exam:   General:  Patient in no acute distress, alert and awake  Cardiovascular: Regular rate and rhythm, no murmurs or rubs  Respiratory: Clear to auscultation bilaterally, no wheezes  Abdomen: Soft, nondistended, no guarding  Musculoskeletal: No cyanosis or clubbing on limited exam   Data Reviewed: Basic Metabolic Panel:  Recent Labs Lab 06/11/14 2334  06/12/14 0722 06/13/14 0522  NA 137 138 143  K 4.7 4.4 4.0  CL 105 110 112  CO2 13* 11* 16*  GLUCOSE 192* 164* 154*  BUN 15 15 12   CREATININE 1.98* 1.70* 1.19*  CALCIUM 11.1* 9.6 10.5   Liver Function Tests:  Recent Labs Lab 06/11/14 2334  AST 22  ALT 12  ALKPHOS 78  BILITOT 1.0  PROT 7.8  ALBUMIN 5.0   No results for input(s): LIPASE, AMYLASE in the last 168 hours. No results for input(s): AMMONIA in the last 168 hours. CBC:  Recent Labs Lab 06/11/14 2334 06/12/14 0722  WBC 11.5* 13.9*  NEUTROABS 10.0*  --   HGB 14.5 12.9  HCT 42.3 37.2  MCV 90.4 89.0  PLT 266 242   Cardiac Enzymes: No results for input(s): CKTOTAL, CKMB, CKMBINDEX, TROPONINI in the last 168 hours. BNP (last 3 results) No results for input(s): BNP in the last 8760 hours.  ProBNP (last 3 results)  Recent Labs  08/27/13 2205 08/28/13 0454  PROBNP 263.1* 251.0*    CBG:  Recent Labs Lab 06/12/14 1217 06/12/14 1715 06/12/14 2105 06/13/14 0738 06/13/14 1151  GLUCAP 160* 145* 126* 144* 144*    No results found for this or any previous visit (from the past 240 hour(s)).   Studies: No results found.  Scheduled Meds: . atorvastatin  20 mg Oral QHS  . cloNIDine  0.1 mg Oral BID  . [START ON 06/14/2014] enoxaparin (LOVENOX) injection  40 mg Subcutaneous Q24H  . fluconazole  200 mg Oral Daily  . folic acid  1 mg Oral Daily  .  gabapentin  600 mg Oral TID  . hydrALAZINE  50 mg Oral 3 times per day  . insulin aspart  0-5 Units Subcutaneous QHS  . insulin aspart  0-9 Units Subcutaneous TID WC  . metoCLOPramide  10 mg Oral QID  . potassium chloride SA  20 mEq Oral Daily  . sodium chloride  3 mL Intravenous Q12H  . sulfaSALAzine  500 mg Oral QID  . traZODone  200 mg Oral QHS   Continuous Infusions: . sodium chloride 75 mL/hr at 06/13/14 0639       Time spent: > 30 minutes    Velvet Bathe  [facility] Pager [phone] If 7PM-7AM, please contact night-coverage at  [email or web address], password [facility] 06/13/2014, 3:54 PM  LOS: 1 day

## 2014-06-13 NOTE — Progress Notes (Signed)
Patient continue to C/O nausea, no vomiting, PRN zofran and scheduled Reglan given. Poor PO intake. Will continue to assess patient.

## 2014-06-14 LAB — BASIC METABOLIC PANEL
ANION GAP: 9 (ref 5–15)
BUN: 8 mg/dL (ref 6–23)
CO2: 20 mmol/L (ref 19–32)
Calcium: 10 mg/dL (ref 8.4–10.5)
Chloride: 112 mmol/L (ref 96–112)
Creatinine, Ser: 1.07 mg/dL (ref 0.50–1.10)
GFR, EST AFRICAN AMERICAN: 59 mL/min — AB (ref >90)
GFR, EST NON AFRICAN AMERICAN: 51 mL/min — AB (ref >90)
Glucose, Bld: 128 mg/dL — ABNORMAL HIGH (ref 70–99)
Potassium: 3.6 mmol/L (ref 3.5–5.1)
SODIUM: 141 mmol/L (ref 135–145)

## 2014-06-14 LAB — GLUCOSE, CAPILLARY
GLUCOSE-CAPILLARY: 155 mg/dL — AB (ref 70–99)
GLUCOSE-CAPILLARY: 155 mg/dL — AB (ref 70–99)
Glucose-Capillary: 142 mg/dL — ABNORMAL HIGH (ref 70–99)
Glucose-Capillary: 204 mg/dL — ABNORMAL HIGH (ref 70–99)

## 2014-06-14 LAB — CBC
HCT: 30.6 % — ABNORMAL LOW (ref 36.0–46.0)
HEMOGLOBIN: 10.4 g/dL — AB (ref 12.0–15.0)
MCH: 30.6 pg (ref 26.0–34.0)
MCHC: 34 g/dL (ref 30.0–36.0)
MCV: 90 fL (ref 78.0–100.0)
PLATELETS: 160 10*3/uL (ref 150–400)
RBC: 3.4 MIL/uL — ABNORMAL LOW (ref 3.87–5.11)
RDW: 14.6 % (ref 11.5–15.5)
WBC: 8.6 10*3/uL (ref 4.0–10.5)

## 2014-06-14 MED ORDER — HYDRALAZINE HCL 20 MG/ML IJ SOLN: 10.0000 mg | Freq: Four times a day (QID) | INTRAMUSCULAR | Status: DC | PRN

## 2014-06-14 NOTE — Progress Notes (Signed)
[facility] PROGRESS NOTE  Michelle Farrell OAC:[id number] DOB: 09/20/1940 DOA: 06/11/2014 PCP: Hermelinda Medicus, MD  Assessment/Plan: Principal Problem:   AKI (acute kidney injury) - resolved after IV fluid rehydration  Active Problems:   Essential hypertension -Continue current regimen clonidine, hydralazine. If blood pressures remain consistently elevated we'll plan on increasing antihypertensive regimen    Diastolic CHF - Compensated currently    Acute gastroenteritis - Suspected patient has A viral gastroenteritis is most likely causing current symptoms. - Discussed with gastroenterologist does not think patient is currently having a Crohn's flare - advance diet as tolerated    Crohn's disease - Suspect Crohn's flare or less likely. There is no reports of bloody diarrhea  - Continue home medication regimen - Patient to follow-up with gastroenterologist on discharge    DM (diabetes mellitus) - Once able will advance diet to diabetic diet currently on clear liquid diet -Continue sliding scale insulin    Metabolic acidosis - Resolving with improvement in renal function   Code Status: Full Family Communication: None at bedside Disposition Plan: Pending continued improvement in condition   Consultants:  None  Procedures:  None  Antibiotics:  None  HPI/Subjective: Patient has no new complaints. Tolerating clears.  Objective: Filed Vitals:   06/14/14 1300  BP: 142/53  Pulse: 75  Temp: 98.4 F (36.9 C)  Resp: 16    Intake/Output Summary (Last 24 hours) at 06/14/14 1612 Last data filed at 06/14/14 1500  Gross per 24 hour  Intake   3075 ml  Output      0 ml  Net   3075 ml   Filed Weights   06/13/14 0500 06/14/14 0553  Weight: 71.895 kg (158 lb 8 oz) 73.8 kg (162 lb 11.2 oz)    Exam:   General:  Patient in no acute distress, alert and awake  Cardiovascular: Regular rate and rhythm, no murmurs or rubs  Respiratory: Clear to auscultation  bilaterally, no wheezes  Abdomen: Soft, nondistended, no guarding  Musculoskeletal: No cyanosis or clubbing on limited exam   Data Reviewed: Basic Metabolic Panel:  Recent Labs Lab 06/11/14 2334 06/12/14 0722 06/13/14 0522 06/14/14 0521  NA 137 138 143 141  K 4.7 4.4 4.0 3.6  CL 105 110 112 112  CO2 13* 11* 16* 20  GLUCOSE 192* 164* 154* 128*  BUN 15 15 12 8   CREATININE 1.98* 1.70* 1.19* 1.07  CALCIUM 11.1* 9.6 10.5 10.0   Liver Function Tests:  Recent Labs Lab 06/11/14 2334  AST 22  ALT 12  ALKPHOS 78  BILITOT 1.0  PROT 7.8  ALBUMIN 5.0   No results for input(s): LIPASE, AMYLASE in the last 168 hours. No results for input(s): AMMONIA in the last 168 hours. CBC:  Recent Labs Lab 06/11/14 2334 06/12/14 0722 06/14/14 0521  WBC 11.5* 13.9* 8.6  NEUTROABS 10.0*  --   --   HGB 14.5 12.9 10.4*  HCT 42.3 37.2 30.6*  MCV 90.4 89.0 90.0  PLT 266 242 160   Cardiac Enzymes: No results for input(s): CKTOTAL, CKMB, CKMBINDEX, TROPONINI in the last 168 hours. BNP (last 3 results) No results for input(s): BNP in the last 8760 hours.  ProBNP (last 3 results)  Recent Labs  08/27/13 2205 08/28/13 0454  PROBNP 263.1* 251.0*    CBG:  Recent Labs Lab 06/13/14 1151 06/13/14 1633 06/13/14 2133 06/14/14 0742 06/14/14 1146  GLUCAP 144* 246* 192* 142* 204*    No results found for this or any previous visit (from the past  240 hour(s)).   Studies: No results found.  Scheduled Meds: . atorvastatin  20 mg Oral QHS  . cloNIDine  0.1 mg Oral BID  . enoxaparin (LOVENOX) injection  40 mg Subcutaneous Q24H  . folic acid  1 mg Oral Daily  . gabapentin  600 mg Oral TID  . hydrALAZINE  50 mg Oral 3 times per day  . insulin aspart  0-5 Units Subcutaneous QHS  . insulin aspart  0-9 Units Subcutaneous TID WC  . metoCLOPramide  10 mg Oral QID  . potassium chloride SA  20 mEq Oral Daily  . sodium chloride  3 mL Intravenous Q12H  . sulfaSALAzine  500 mg Oral QID  .  traZODone  200 mg Oral QHS   Continuous Infusions:    Time spent: > 30 minutes    Velvet Bathe  [facility] Pager [phone] If 7PM-7AM, please contact night-coverage at [email or web address], password [facility] 06/14/2014, 4:12 PM  LOS: 2 days

## 2014-06-14 NOTE — Progress Notes (Signed)
UR completed 

## 2014-06-15 LAB — GLUCOSE, CAPILLARY
GLUCOSE-CAPILLARY: 135 mg/dL — AB (ref 70–99)
GLUCOSE-CAPILLARY: 191 mg/dL — AB (ref 70–99)
GLUCOSE-CAPILLARY: 124 mg/dL — AB (ref 70–99)
Glucose-Capillary: 171 mg/dL — ABNORMAL HIGH (ref 70–99)

## 2014-06-15 MED ORDER — LISINOPRIL 10 MG PO TABS
10.0000 mg | ORAL_TABLET | Freq: Every day | ORAL | Status: DC
  Administered 2014-06-15 – 2014-06-16 (×2): 10 mg via ORAL
  Filled 2014-06-15 (×2): qty 1

## 2014-06-15 MED ORDER — SPIRONOLACTONE 25 MG PO TABS
25.0000 mg | ORAL_TABLET | Freq: Every day | ORAL | Status: DC
  Administered 2014-06-15 – 2014-06-16 (×2): 25 mg via ORAL
  Filled 2014-06-15 (×2): qty 1

## 2014-06-15 NOTE — Progress Notes (Signed)
[facility] PROGRESS NOTE  Michelle Farrell UUV:[id number] DOB: July 09, 1940 DOA: 06/11/2014 PCP: Hermelinda Medicus, MD  Assessment/Plan: Principal Problem:   AKI (acute kidney injury) - resolved after IV fluid rehydration  Active Problems:   Essential hypertension -Continue current regimen clonidine, hydralazine.  - Patient reportedly on lisinopril at home as such will continue and place back on spironolactone    Diastolic CHF - Compensated currently - will continue ACE I and spironolactone     Acute gastroenteritis - Suspected patient has A viral gastroenteritis is most likely causing current symptoms. - Discussed with gastroenterologist does not think patient is currently having a Crohn's flare - advance diet as tolerated    Crohn's disease - Suspect Crohn's flare or less likely. There is no reports of bloody diarrhea  - Continue home medication regimen - Patient to follow-up with gastroenterologist on discharge    DM (diabetes mellitus) -Continue sliding scale insulin    Metabolic acidosis - Resolved with improvement in renal function   Code Status: Full Family Communication: None at bedside Disposition Plan: Pending continued improvement in condition   Consultants:  None  Procedures:  None  Antibiotics:  None  HPI/Subjective: Patient has no new complaints. Tolerating diet with nausea at times.  Objective: Filed Vitals:   06/15/14 1300  BP: 163/68  Pulse: 81  Temp: 98.5 F (36.9 C)  Resp: 18    Intake/Output Summary (Last 24 hours) at 06/15/14 1353 Last data filed at 06/15/14 0913  Gross per 24 hour  Intake    960 ml  Output      0 ml  Net    960 ml   Filed Weights   06/13/14 0500 06/14/14 0553 06/15/14 0321  Weight: 71.895 kg (158 lb 8 oz) 73.8 kg (162 lb 11.2 oz) 74.39 kg (164 lb)    Exam:   General:  Patient in no acute distress, alert and awake  Cardiovascular: Regular rate and rhythm, no murmurs or rubs  Respiratory: Clear  to auscultation bilaterally, no wheezes  Abdomen: Soft, nondistended, no guarding  Musculoskeletal: No cyanosis or clubbing on limited exam   Data Reviewed: Basic Metabolic Panel:  Recent Labs Lab 06/11/14 2334 06/12/14 0722 06/13/14 0522 06/14/14 0521  NA 137 138 143 141  K 4.7 4.4 4.0 3.6  CL 105 110 112 112  CO2 13* 11* 16* 20  GLUCOSE 192* 164* 154* 128*  BUN 15 15 12 8   CREATININE 1.98* 1.70* 1.19* 1.07  CALCIUM 11.1* 9.6 10.5 10.0   Liver Function Tests:  Recent Labs Lab 06/11/14 2334  AST 22  ALT 12  ALKPHOS 78  BILITOT 1.0  PROT 7.8  ALBUMIN 5.0   No results for input(s): LIPASE, AMYLASE in the last 168 hours. No results for input(s): AMMONIA in the last 168 hours. CBC:  Recent Labs Lab 06/11/14 2334 06/12/14 0722 06/14/14 0521  WBC 11.5* 13.9* 8.6  NEUTROABS 10.0*  --   --   HGB 14.5 12.9 10.4*  HCT 42.3 37.2 30.6*  MCV 90.4 89.0 90.0  PLT 266 242 160   Cardiac Enzymes: No results for input(s): CKTOTAL, CKMB, CKMBINDEX, TROPONINI in the last 168 hours. BNP (last 3 results) No results for input(s): BNP in the last 8760 hours.  ProBNP (last 3 results)  Recent Labs  08/27/13 2205 08/28/13 0454  PROBNP 263.1* 251.0*    CBG:  Recent Labs Lab 06/14/14 1146 06/14/14 1716 06/14/14 2058 06/15/14 0740 06/15/14 1152  GLUCAP 204* 155* 155* 135* 171*    No  results found for this or any previous visit (from the past 240 hour(s)).   Studies: No results found.  Scheduled Meds: . atorvastatin  20 mg Oral QHS  . cloNIDine  0.1 mg Oral BID  . enoxaparin (LOVENOX) injection  40 mg Subcutaneous Q24H  . folic acid  1 mg Oral Daily  . gabapentin  600 mg Oral TID  . hydrALAZINE  50 mg Oral 3 times per day  . insulin aspart  0-5 Units Subcutaneous QHS  . insulin aspart  0-9 Units Subcutaneous TID WC  . lisinopril  10 mg Oral Daily  . metoCLOPramide  10 mg Oral QID  . potassium chloride SA  20 mEq Oral Daily  . sodium chloride  3 mL  Intravenous Q12H  . [START ON 06/16/2014] spironolactone  25 mg Oral [id number]  . sulfaSALAzine  500 mg Oral QID  . traZODone  200 mg Oral QHS   Continuous Infusions:    Time spent: > 30 minutes    Velvet Bathe  [facility] Pager [phone] If 7PM-7AM, please contact night-coverage at [email or web address], password [facility] 06/15/2014, 1:53 PM  LOS: 3 days

## 2014-06-16 LAB — GLUCOSE, CAPILLARY: GLUCOSE-CAPILLARY: 134 mg/dL — AB (ref 70–99)

## 2014-06-16 MED ORDER — LISINOPRIL 10 MG PO TABS: 10.0000 mg | ORAL_TABLET | Freq: Every day | ORAL | Status: DC

## 2014-06-16 MED ORDER — ONDANSETRON HCL 4 MG PO TABS: 4.0000 mg | ORAL_TABLET | Freq: Four times a day (QID) | ORAL | Status: DC | PRN

## 2014-06-16 NOTE — Evaluation (Signed)
Physical Therapy Evaluation Patient Details Name: Annalynn Centanni MRN: [id number] DOB: February 17, 1941 Today's Date: 06/16/2014   History of Present Illness  74 y.o. female with a history of Crohn's disease, Diastolic CHF, HTN, IDDM, and Hyperlipidemia, COPD, neuropathy, chronic back pain admitted with nausea, vomiting, diarrhea, found to have acute kidney injury.  Clinical Impression  Pt admitted with above diagnosis. Pt currently with functional limitations due to the deficits listed below (see PT Problem List). Pt will benefit from skilled PT to increase their independence and safety with mobility to allow discharge to the venue listed below.  Pt very unsteady without assistive device and reports hx of falls, therefore recommend supervision for mobility and HHPT.  Daughter present at beginning of evaluation and left prior to getting pt out of bed however reports she likely plans to take pt home with her since pt lives alone.       Follow Up Recommendations Home health PT;Supervision for mobility/OOB    Equipment Recommendations  None recommended by PT    Recommendations for Other Services       Precautions / Restrictions Precautions Precautions: Fall Restrictions Weight Bearing Restrictions: No      Mobility  Bed Mobility Overal bed mobility: Modified Independent                Transfers Overall transfer level: Needs assistance Equipment used: 1 person hand held assist Transfers: Sit to/from Stand Sit to Stand: Min assist         General transfer comment: assist to steady with rise, verbal cues for safe technique  Ambulation/Gait Ambulation/Gait assistance: Min guard;Min assist Ambulation Distance (Feet): 80 Feet Assistive device: Rolling walker (2 wheeled) Gait Pattern/deviations: Step-through pattern;Decreased stride length;Trunk flexed     General Gait Details: shuffling gait with short steps and unsteady gait without assistive device, provided RW and improved  step length and steadiness observed.  Stairs            Wheelchair Mobility    Modified Rankin (Stroke Patients Only)       Balance Overall balance assessment: History of Falls                                           Pertinent Vitals/Pain Pain Assessment: No/denies pain    Home Living Family/patient expects to be discharged to:: Private residence Living Arrangements: Alone;Other relatives Available Help at Discharge: Family (daughter)   Home Access: Stairs to enter   Technical brewer of Steps: 3 Home Layout: One level Home Equipment: Cane - single point;Walker - 2 wheels      Prior Function Level of Independence: Independent         Comments: has RW and cane but doesn't use per daughter     Hand Dominance        Extremity/Trunk Assessment               Lower Extremity Assessment: Generalized weakness         Communication   Communication: No difficulties  Cognition Arousal/Alertness: Awake/alert Behavior During Therapy: WFL for tasks assessed/performed Overall Cognitive Status: Within Functional Limits for tasks assessed                      General Comments      Exercises        Assessment/Plan    PT Assessment Patient needs continued PT services  PT Diagnosis Difficulty walking   PT Problem List Decreased strength;Decreased balance;Decreased mobility;Decreased knowledge of use of DME  PT Treatment Interventions DME instruction;Gait training;Stair training;Functional mobility training;Therapeutic activities;Therapeutic exercise;Patient/family education   PT Goals (Current goals can be found in the Care Plan section) Acute Rehab PT Goals PT Goal Formulation: With patient Time For Goal Achievement: 06/23/14 Potential to Achieve Goals: Good    Frequency Min 3X/week   Barriers to discharge        Co-evaluation               End of Session   Activity Tolerance: Patient tolerated  treatment well Patient left: in chair;with call bell/phone within reach;with chair alarm set (with student nurse)      Functional Assessment Tool Used: clinical judgement Functional Limitation: Mobility: Walking and moving around Mobility: Walking and Moving Around Current Status (G9842): At least 20 percent but less than 40 percent impaired, limited or restricted Mobility: Walking and Moving Around Goal Status [phone]): At least 1 percent but less than 20 percent impaired, limited or restricted    Time: 0932-0946 PT Time Calculation (min) (ACUTE ONLY): 14 min   Charges:   PT Evaluation $Initial PT Evaluation Tier I: 1 Procedure     PT G Codes:   PT G-Codes **NOT FOR INPATIENT CLASS** Functional Assessment Tool Used: clinical judgement Functional Limitation: Mobility: Walking and moving around Mobility: Walking and Moving Around Current Status (O1188): At least 20 percent but less than 40 percent impaired, limited or restricted Mobility: Walking and Moving Around Goal Status [phone]): At least 1 percent but less than 20 percent impaired, limited or restricted    Kengo Sturges,KATHrine E 06/16/2014, 10:31 AM Carmelia Bake, PT, DPT 06/16/2014 Pager: [phone]

## 2014-06-16 NOTE — Progress Notes (Signed)
Spoke to pt concerning Home Health PT.  [facility] referral called to in house rep.

## 2014-06-16 NOTE — Discharge Summary (Signed)
Physician Discharge Summary  Michelle Farrell FHL:[id number] DOB: Aug 05, 1940 DOA: 06/11/2014  PCP: Hermelinda Medicus, MD  Admit date: 06/11/2014 Discharge date: 06/16/2014  Time spent: > 35  minutes  Recommendations for Outpatient Follow-up:  1. Monitor blood pressures and adjust blood pressure medications accordingly 2. Monitor hemoglobin levels  Discharge Diagnoses:  Principal Problem:   AKI (acute kidney injury) Active Problems:   Essential hypertension   Diastolic CHF   Acute gastroenteritis   Crohn's disease   DM (diabetes mellitus)   Metabolic acidosis   Hypercalcemia   Oral candidiasis   Discharge Condition: stable  Diet recommendation: soft bland diet  Filed Weights   06/14/14 0553 06/15/14 0321 06/16/14 0538  Weight: 73.8 kg (162 lb 11.2 oz) 74.39 kg (164 lb) 75.2 kg (165 lb 12.6 oz)    History of present illness:  Patient is a 74 year old with history of Crohn's a presented with nausea and vomiting and poor oral intake.  Hospital Course:  AKI - Resolved with improved oral intake - Restarted lisinopril on discharge  Nausea vomiting - Most likely infectious etiology. I suspect a viral gastroenteritis - Resolving patient to continue supportive therapy at home will discharge on antiemetic - improved oral intake on discharge - Physical therapy evaluated and recommended home PT. As such will place order  Procedures:  None  Consultations:  None  Discharge Exam: Filed Vitals:   06/16/14 0155  BP: 108/50  Pulse: 73  Temp: 99.1 F (37.3 C)  Resp: 16    General: Patient in no acute distress, alert and awake Cardiovascular: Regular rate and rhythm, no rubs Respiratory: Clear to auscultation bilaterally, no wheezes  Discharge Instructions   Discharge Instructions    Call MD for:  difficulty breathing, headache or visual disturbances    Complete by:  As directed      Call MD for:  persistant nausea and vomiting    Complete by:  As directed      Call MD for:  redness, tenderness, or signs of infection (pain, swelling, redness, odor or green/yellow discharge around incision site)    Complete by:  As directed      Call MD for:  severe uncontrolled pain    Complete by:  As directed      Diet - low sodium heart healthy    Complete by:  As directed      Discharge instructions    Complete by:  As directed   Please be sure to follow up with your primary care physician within the next 2 or 3 weeks or sooner should any new concerns arise. I will have our secretary set up follow-up appointment with the gastroenterologist on discharge. Please call the office should you do not hear from them after discharge     Increase activity slowly    Complete by:  As directed           Current Discharge Medication List    START taking these medications   Details  lisinopril (PRINIVIL,ZESTRIL) 10 MG tablet Take 1 tablet (10 mg total) by mouth daily.    ondansetron (ZOFRAN) 4 MG tablet Take 1 tablet (4 mg total) by mouth every 6 (six) hours as needed for nausea. Qty: 20 tablet, Refills: 0      CONTINUE these medications which have NOT CHANGED   Details  Cholecalciferol (VITAMIN D) 2000 UNITS CAPS Take 1 capsule by mouth daily.    cloNIDine (CATAPRES) 0.1 MG tablet Take 1 tablet (0.1 mg total) by mouth 2 (two) times  daily. Qty: 60 tablet, Refills: 11    folic acid (FOLVITE) 1 MG tablet Take 1 mg by mouth daily.    gabapentin (NEURONTIN) 600 MG tablet Take 600 mg by mouth 3 (three) times daily.     hydrALAZINE (APRESOLINE) 50 MG tablet Take 1 tablet (50 mg total) by mouth every 8 (eight) hours. Qty: 90 tablet, Refills: 0    HYDROcodone-acetaminophen (NORCO) 10-325 MG per tablet Take 1 tablet by mouth every 6 (six) hours as needed (for pain.).     metoCLOPramide (REGLAN) 10 MG tablet Take 10 mg by mouth 4 (four) times daily.    omeprazole (PRILOSEC) 20 MG capsule Take 20 mg by mouth every morning.     potassium chloride SA (K-DUR,KLOR-CON)  20 MEQ tablet Take 20 mEq by mouth daily.    simvastatin (ZOCOR) 40 MG tablet Take 40 mg by mouth at bedtime.      sitaGLIPtin (JANUVIA) 100 MG tablet Take 50 mg by mouth every morning.     spironolactone (ALDACTONE) 25 MG tablet Take 25 mg by mouth every morning.     sulfaSALAzine (AZULFIDINE) 500 MG tablet Take 500 mg by mouth 4 (four) times daily.    traZODone (DESYREL) 100 MG tablet Take 200 mg by mouth at bedtime.  Refills: 0    PROAIR HFA 108 (90 BASE) MCG/ACT inhaler Inhale 1 puff into the lungs every 6 (six) hours as needed. Shortness of breath or wheezing Refills: 3      STOP taking these medications     furosemide (LASIX) 40 MG tablet        No Known Allergies    The results of significant diagnostics from this hospitalization (including imaging, microbiology, ancillary and laboratory) are listed below for reference.    Significant Diagnostic Studies: No results found.  Microbiology: No results found for this or any previous visit (from the past 240 hour(s)).   Labs: Basic Metabolic Panel:  Recent Labs Lab 06/11/14 2334 06/12/14 0722 06/13/14 0522 06/14/14 0521  NA 137 138 143 141  K 4.7 4.4 4.0 3.6  CL 105 110 112 112  CO2 13* 11* 16* 20  GLUCOSE 192* 164* 154* 128*  BUN 15 15 12 8   CREATININE 1.98* 1.70* 1.19* 1.07  CALCIUM 11.1* 9.6 10.5 10.0   Liver Function Tests:  Recent Labs Lab 06/11/14 2334  AST 22  ALT 12  ALKPHOS 78  BILITOT 1.0  PROT 7.8  ALBUMIN 5.0   No results for input(s): LIPASE, AMYLASE in the last 168 hours. No results for input(s): AMMONIA in the last 168 hours. CBC:  Recent Labs Lab 06/11/14 2334 06/12/14 0722 06/14/14 0521  WBC 11.5* 13.9* 8.6  NEUTROABS 10.0*  --   --   HGB 14.5 12.9 10.4*  HCT 42.3 37.2 30.6*  MCV 90.4 89.0 90.0  PLT 266 242 160   Cardiac Enzymes: No results for input(s): CKTOTAL, CKMB, CKMBINDEX, TROPONINI in the last 168 hours. BNP: BNP (last 3 results) No results for input(s):  BNP in the last 8760 hours.  ProBNP (last 3 results)  Recent Labs  08/27/13 2205 08/28/13 0454  PROBNP 263.1* 251.0*    CBG:  Recent Labs Lab 06/15/14 0740 06/15/14 1152 06/15/14 1641 06/15/14 2050 06/16/14 0752  GLUCAP 135* 171* 191* 124* 134*       Signed:  Velvet Bathe  [facility] 06/16/2014, 10:57 AM

## 2014-06-19 ENCOUNTER — Inpatient Hospital Stay (HOSPITAL_COMMUNITY)
Admission: EM | Admit: 2014-06-19 | Discharge: 2014-06-27 | DRG: 871 | Disposition: A | Discharge status: Rehabilitation Facility | Payer: Medicare Other | Attending: Internal Medicine | Admitting: Internal Medicine

## 2014-06-19 ENCOUNTER — Emergency Department (HOSPITAL_COMMUNITY): Payer: Medicare Other

## 2014-06-19 ENCOUNTER — Encounter (HOSPITAL_COMMUNITY): Payer: Self-pay

## 2014-06-19 ENCOUNTER — Inpatient Hospital Stay (HOSPITAL_COMMUNITY): Payer: Medicare Other

## 2014-06-19 DIAGNOSIS — Z9049 Acquired absence of other specified parts of digestive tract: Secondary | ICD-10-CM | POA: Diagnosis present

## 2014-06-19 DIAGNOSIS — D649 Anemia, unspecified: Secondary | ICD-10-CM | POA: Diagnosis present

## 2014-06-19 DIAGNOSIS — F1120 Opioid dependence, uncomplicated: Secondary | ICD-10-CM | POA: Diagnosis present

## 2014-06-19 DIAGNOSIS — M549 Dorsalgia, unspecified: Secondary | ICD-10-CM | POA: Diagnosis present

## 2014-06-19 DIAGNOSIS — Z90710 Acquired absence of both cervix and uterus: Secondary | ICD-10-CM

## 2014-06-19 DIAGNOSIS — M25559 Pain in unspecified hip: Secondary | ICD-10-CM | POA: Diagnosis present

## 2014-06-19 DIAGNOSIS — N390 Urinary tract infection, site not specified: Secondary | ICD-10-CM | POA: Diagnosis present

## 2014-06-19 DIAGNOSIS — W19XXXD Unspecified fall, subsequent encounter: Secondary | ICD-10-CM | POA: Diagnosis not present

## 2014-06-19 DIAGNOSIS — I69398 Other sequelae of cerebral infarction: Secondary | ICD-10-CM | POA: Diagnosis not present

## 2014-06-19 DIAGNOSIS — E860 Dehydration: Secondary | ICD-10-CM | POA: Diagnosis present

## 2014-06-19 DIAGNOSIS — I10 Essential (primary) hypertension: Secondary | ICD-10-CM | POA: Diagnosis present

## 2014-06-19 DIAGNOSIS — Z79899 Other long term (current) drug therapy: Secondary | ICD-10-CM | POA: Diagnosis not present

## 2014-06-19 DIAGNOSIS — I6330 Cerebral infarction due to thrombosis of unspecified cerebral artery: Secondary | ICD-10-CM | POA: Diagnosis not present

## 2014-06-19 DIAGNOSIS — R001 Bradycardia, unspecified: Secondary | ICD-10-CM | POA: Diagnosis not present

## 2014-06-19 DIAGNOSIS — I6359 Cerebral infarction due to unspecified occlusion or stenosis of other cerebral artery: Secondary | ICD-10-CM | POA: Diagnosis not present

## 2014-06-19 DIAGNOSIS — G009 Bacterial meningitis, unspecified: Secondary | ICD-10-CM | POA: Diagnosis not present

## 2014-06-19 DIAGNOSIS — G9340 Encephalopathy, unspecified: Secondary | ICD-10-CM | POA: Diagnosis present

## 2014-06-19 DIAGNOSIS — A419 Sepsis, unspecified organism: Secondary | ICD-10-CM | POA: Insufficient documentation

## 2014-06-19 DIAGNOSIS — I509 Heart failure, unspecified: Secondary | ICD-10-CM | POA: Diagnosis not present

## 2014-06-19 DIAGNOSIS — K219 Gastro-esophageal reflux disease without esophagitis: Secondary | ICD-10-CM | POA: Diagnosis present

## 2014-06-19 DIAGNOSIS — M545 Low back pain: Secondary | ICD-10-CM | POA: Diagnosis not present

## 2014-06-19 DIAGNOSIS — E876 Hypokalemia: Secondary | ICD-10-CM | POA: Diagnosis not present

## 2014-06-19 DIAGNOSIS — B9620 Unspecified Escherichia coli [E. coli] as the cause of diseases classified elsewhere: Secondary | ICD-10-CM | POA: Diagnosis present

## 2014-06-19 DIAGNOSIS — K5090 Crohn's disease, unspecified, without complications: Secondary | ICD-10-CM | POA: Diagnosis present

## 2014-06-19 DIAGNOSIS — G039 Meningitis, unspecified: Secondary | ICD-10-CM | POA: Insufficient documentation

## 2014-06-19 DIAGNOSIS — W19XXXA Unspecified fall, initial encounter: Secondary | ICD-10-CM | POA: Diagnosis present

## 2014-06-19 DIAGNOSIS — Z6826 Body mass index (BMI) 26.0-26.9, adult: Secondary | ICD-10-CM

## 2014-06-19 DIAGNOSIS — Z720 Tobacco use: Secondary | ICD-10-CM | POA: Diagnosis present

## 2014-06-19 DIAGNOSIS — G8929 Other chronic pain: Secondary | ICD-10-CM | POA: Diagnosis present

## 2014-06-19 DIAGNOSIS — E43 Unspecified severe protein-calorie malnutrition: Secondary | ICD-10-CM | POA: Diagnosis present

## 2014-06-19 DIAGNOSIS — R6520 Severe sepsis without septic shock: Secondary | ICD-10-CM | POA: Diagnosis present

## 2014-06-19 DIAGNOSIS — B377 Candidal sepsis: Secondary | ICD-10-CM | POA: Diagnosis not present

## 2014-06-19 DIAGNOSIS — E118 Type 2 diabetes mellitus with unspecified complications: Secondary | ICD-10-CM | POA: Diagnosis not present

## 2014-06-19 DIAGNOSIS — N179 Acute kidney failure, unspecified: Secondary | ICD-10-CM

## 2014-06-19 DIAGNOSIS — I639 Cerebral infarction, unspecified: Secondary | ICD-10-CM | POA: Diagnosis present

## 2014-06-19 DIAGNOSIS — E785 Hyperlipidemia, unspecified: Secondary | ICD-10-CM | POA: Diagnosis present

## 2014-06-19 DIAGNOSIS — I5032 Chronic diastolic (congestive) heart failure: Secondary | ICD-10-CM | POA: Diagnosis present

## 2014-06-19 DIAGNOSIS — R410 Disorientation, unspecified: Secondary | ICD-10-CM | POA: Diagnosis not present

## 2014-06-19 DIAGNOSIS — F17210 Nicotine dependence, cigarettes, uncomplicated: Secondary | ICD-10-CM | POA: Diagnosis present

## 2014-06-19 DIAGNOSIS — R1900 Intra-abdominal and pelvic swelling, mass and lump, unspecified site: Secondary | ICD-10-CM | POA: Diagnosis present

## 2014-06-19 DIAGNOSIS — Z833 Family history of diabetes mellitus: Secondary | ICD-10-CM

## 2014-06-19 DIAGNOSIS — E1140 Type 2 diabetes mellitus with diabetic neuropathy, unspecified: Secondary | ICD-10-CM | POA: Diagnosis present

## 2014-06-19 DIAGNOSIS — I5033 Acute on chronic diastolic (congestive) heart failure: Secondary | ICD-10-CM | POA: Diagnosis not present

## 2014-06-19 DIAGNOSIS — R4182 Altered mental status, unspecified: Secondary | ICD-10-CM | POA: Insufficient documentation

## 2014-06-19 DIAGNOSIS — E119 Type 2 diabetes mellitus without complications: Secondary | ICD-10-CM

## 2014-06-19 DIAGNOSIS — IMO0001 Reserved for inherently not codable concepts without codable children: Secondary | ICD-10-CM | POA: Insufficient documentation

## 2014-06-19 HISTORY — DX: Chronic kidney disease, unspecified: N18.9

## 2014-06-19 HISTORY — DX: Esophageal obstruction: K22.2

## 2014-06-19 HISTORY — DX: Chronic obstructive pulmonary disease, unspecified: J44.9

## 2014-06-19 HISTORY — DX: Peptic ulcer, site unspecified, unspecified as acute or chronic, without hemorrhage or perforation: K27.9

## 2014-06-19 LAB — COMPREHENSIVE METABOLIC PANEL
ALBUMIN: 3.8 g/dL (ref 3.5–5.2)
ALK PHOS: 65 U/L (ref 39–117)
ALT: 18 U/L (ref 0–35)
AST: 32 U/L (ref 0–37)
Anion gap: 12 (ref 5–15)
BUN: 8 mg/dL (ref 6–23)
CO2: 25 mmol/L (ref 19–32)
Calcium: 10.3 mg/dL (ref 8.4–10.5)
Chloride: 102 mmol/L (ref 96–112)
Creatinine, Ser: 1.21 mg/dL — ABNORMAL HIGH (ref 0.50–1.10)
GFR calc Af Amer: 51 mL/min — ABNORMAL LOW (ref >90)
GFR calc non Af Amer: 44 mL/min — ABNORMAL LOW (ref >90)
GLUCOSE: 201 mg/dL — AB (ref 70–99)
POTASSIUM: 3.9 mmol/L (ref 3.5–5.1)
SODIUM: 139 mmol/L (ref 135–145)
Total Bilirubin: 0.5 mg/dL (ref 0.3–1.2)
Total Protein: 7 g/dL (ref 6.0–8.3)

## 2014-06-19 LAB — LACTIC ACID, PLASMA: Lactic Acid, Venous: 2.2 mmol/L (ref 0.5–2.0)

## 2014-06-19 LAB — CBC WITH DIFFERENTIAL/PLATELET
Basophils Absolute: 0 10*3/uL (ref 0.0–0.1)
Basophils Relative: 0 % (ref 0–1)
EOS ABS: 0 10*3/uL (ref 0.0–0.7)
EOS PCT: 0 % (ref 0–5)
HCT: 35.8 % — ABNORMAL LOW (ref 36.0–46.0)
HEMOGLOBIN: 12.5 g/dL (ref 12.0–15.0)
Lymphocytes Relative: 6 % — ABNORMAL LOW (ref 12–46)
Lymphs Abs: 0.9 10*3/uL (ref 0.7–4.0)
MCH: 30.7 pg (ref 26.0–34.0)
MCHC: 34.9 g/dL (ref 30.0–36.0)
MCV: 88 fL (ref 78.0–100.0)
MONO ABS: 1.7 10*3/uL — AB (ref 0.1–1.0)
MONOS PCT: 11 % (ref 3–12)
Neutro Abs: 13.5 10*3/uL — ABNORMAL HIGH (ref 1.7–7.7)
Neutrophils Relative %: 83 % — ABNORMAL HIGH (ref 43–77)
Platelets: 204 10*3/uL (ref 150–400)
RBC: 4.07 MIL/uL (ref 3.87–5.11)
RDW: 14.4 % (ref 11.5–15.5)
WBC: 16.1 10*3/uL — ABNORMAL HIGH (ref 4.0–10.5)

## 2014-06-19 LAB — URINE MICROSCOPIC-ADD ON

## 2014-06-19 LAB — URINALYSIS, ROUTINE W REFLEX MICROSCOPIC
Bilirubin Urine: NEGATIVE
Glucose, UA: NEGATIVE mg/dL
KETONES UR: NEGATIVE mg/dL
Nitrite: POSITIVE — AB
Protein, ur: 100 mg/dL — AB
Specific Gravity, Urine: 1.016 (ref 1.005–1.030)
Urobilinogen, UA: 0.2 mg/dL (ref 0.0–1.0)
pH: 5 (ref 5.0–8.0)

## 2014-06-19 LAB — I-STAT CG4 LACTIC ACID, ED
Lactic Acid, Venous: 2.59 mmol/L (ref 0.5–2.0)
Lactic Acid, Venous: 2.08 mmol/L (ref 0.5–2.0)

## 2014-06-19 LAB — GLUCOSE, CAPILLARY: Glucose-Capillary: 147 mg/dL — ABNORMAL HIGH (ref 70–99)

## 2014-06-19 LAB — CK: Total CK: 624 U/L — ABNORMAL HIGH (ref 7–177)

## 2014-06-19 LAB — PROCALCITONIN: Procalcitonin: <0.1 ng/mL

## 2014-06-19 LAB — APTT: aPTT: 32 seconds (ref 24–37)

## 2014-06-19 LAB — PROTIME-INR
INR: 1.09 (ref 0.00–1.49)
PROTHROMBIN TIME: 14.2 s (ref 11.6–15.2)

## 2014-06-19 MED ORDER — LEVETIRACETAM IN NACL 1000 MG/100ML IV SOLN
1000.0000 mg | Freq: Once | INTRAVENOUS | Status: AC
  Administered 2014-06-19: 1000 mg via INTRAVENOUS
  Filled 2014-06-19: qty 100

## 2014-06-19 MED ORDER — LORAZEPAM 2 MG/ML IJ SOLN
2.0000 mg | Freq: Once | INTRAMUSCULAR | Status: AC
  Administered 2014-06-19: 1 mg via INTRAVENOUS

## 2014-06-19 MED ORDER — SODIUM CHLORIDE 0.9 % IJ SOLN
3.0000 mL | Freq: Two times a day (BID) | INTRAMUSCULAR | Status: DC
  Administered 2014-06-19 – 2014-06-27 (×9): 3 mL via INTRAVENOUS

## 2014-06-19 MED ORDER — INSULIN ASPART 100 UNIT/ML ~~LOC~~ SOLN
0.0000 [IU] | SUBCUTANEOUS | Status: DC
  Administered 2014-06-20: 2 [IU] via SUBCUTANEOUS
  Administered 2014-06-22: 3 [IU] via SUBCUTANEOUS
  Administered 2014-06-23 (×2): 2 [IU] via SUBCUTANEOUS
  Administered 2014-06-24: 3 [IU] via SUBCUTANEOUS
  Administered 2014-06-25: 2 [IU] via SUBCUTANEOUS
  Administered 2014-06-25 (×2): 3 [IU] via SUBCUTANEOUS
  Administered 2014-06-26: 2 [IU] via SUBCUTANEOUS
  Administered 2014-06-26: 3 [IU] via SUBCUTANEOUS
  Administered 2014-06-27 (×2): 2 [IU] via SUBCUTANEOUS

## 2014-06-19 MED ORDER — ACETAMINOPHEN 325 MG PO TABS: 650.0000 mg | ORAL_TABLET | Freq: Four times a day (QID) | ORAL | Status: DC | PRN

## 2014-06-19 MED ORDER — HEPARIN SODIUM (PORCINE) 5000 UNIT/ML IJ SOLN: 5000.0000 [IU] | Freq: Three times a day (TID) | INTRAMUSCULAR | Status: DC

## 2014-06-19 MED ORDER — HYDRALAZINE HCL 20 MG/ML IJ SOLN
10.0000 mg | Freq: Four times a day (QID) | INTRAMUSCULAR | Status: DC | PRN
  Administered 2014-06-19: 10 mg via INTRAVENOUS
  Filled 2014-06-19 (×2): qty 1

## 2014-06-19 MED ORDER — NALOXONE HCL 0.4 MG/ML IJ SOLN
0.4000 mg | INTRAMUSCULAR | Status: DC | PRN
  Administered 2014-06-19: 0.4 mg via INTRAVENOUS
  Filled 2014-06-19: qty 1

## 2014-06-19 MED ORDER — ONDANSETRON HCL 4 MG/2ML IJ SOLN: 4.0000 mg | Freq: Four times a day (QID) | INTRAMUSCULAR | Status: DC | PRN

## 2014-06-19 MED ORDER — BISACODYL 10 MG RE SUPP: 10.0000 mg | Freq: Every day | RECTAL | Status: DC | PRN

## 2014-06-19 MED ORDER — SODIUM CHLORIDE 0.9 % IV BOLUS (SEPSIS)
500.0000 mL | INTRAVENOUS | Status: AC
  Administered 2014-06-19: 500 mL via INTRAVENOUS

## 2014-06-19 MED ORDER — SODIUM CHLORIDE 0.9 % IV BOLUS (SEPSIS)
1000.0000 mL | INTRAVENOUS | Status: AC
  Administered 2014-06-19 (×2): 1000 mL via INTRAVENOUS

## 2014-06-19 MED ORDER — VANCOMYCIN HCL IN DEXTROSE 1-5 GM/200ML-% IV SOLN
1000.0000 mg | Freq: Once | INTRAVENOUS | Status: DC
  Filled 2014-06-19: qty 200

## 2014-06-19 MED ORDER — PIPERACILLIN-TAZOBACTAM 3.375 G IVPB 30 MIN
3.3750 g | Freq: Once | INTRAVENOUS | Status: AC
  Administered 2014-06-19: 3.375 g via INTRAVENOUS
  Filled 2014-06-19: qty 50

## 2014-06-19 MED ORDER — FAMOTIDINE IN NACL 20-0.9 MG/50ML-% IV SOLN
20.0000 mg | INTRAVENOUS | Status: DC
  Administered 2014-06-19 – 2014-06-22 (×4): 20 mg via INTRAVENOUS
  Filled 2014-06-19 (×5): qty 50

## 2014-06-19 MED ORDER — LEVETIRACETAM IN NACL 500 MG/100ML IV SOLN
500.0000 mg | Freq: Two times a day (BID) | INTRAVENOUS | Status: DC
  Administered 2014-06-20 – 2014-06-22 (×5): 500 mg via INTRAVENOUS
  Filled 2014-06-19 (×6): qty 100

## 2014-06-19 MED ORDER — VANCOMYCIN HCL 10 G IV SOLR
1250.0000 mg | INTRAVENOUS | Status: DC
  Filled 2014-06-19: qty 1250

## 2014-06-19 MED ORDER — LORAZEPAM 2 MG/ML IJ SOLN
INTRAMUSCULAR | Status: AC
  Administered 2014-06-19: 1 mg via INTRAVENOUS
  Filled 2014-06-19: qty 1

## 2014-06-19 MED ORDER — ACETAMINOPHEN 650 MG RE SUPP
650.0000 mg | RECTAL | Status: DC | PRN
  Administered 2014-06-19: 650 mg via RECTAL
  Filled 2014-06-19: qty 1

## 2014-06-19 MED ORDER — SODIUM CHLORIDE 0.9 % IV SOLN
INTRAVENOUS | Status: DC
  Administered 2014-06-19: 23:00:00 via INTRAVENOUS
  Filled 2014-06-19 (×2): qty 1000

## 2014-06-19 MED ORDER — SODIUM CHLORIDE 0.9 % IV SOLN
500.0000 mg | Freq: Once | INTRAVENOUS | Status: AC
  Administered 2014-06-19: 500 mg via INTRAVENOUS
  Filled 2014-06-19: qty 500

## 2014-06-19 MED ORDER — CEFTRIAXONE SODIUM IN DEXTROSE 40 MG/ML IV SOLN
2.0000 g | Freq: Two times a day (BID) | INTRAVENOUS | Status: DC
  Administered 2014-06-19 – 2014-06-22 (×6): 2 g via INTRAVENOUS
  Filled 2014-06-19 (×7): qty 50

## 2014-06-19 MED ORDER — HEPARIN SODIUM (PORCINE) 5000 UNIT/ML IJ SOLN
5000.0000 [IU] | Freq: Three times a day (TID) | INTRAMUSCULAR | Status: DC
  Administered 2014-06-20 – 2014-06-27 (×22): 5000 [IU] via SUBCUTANEOUS
  Filled 2014-06-19 (×22): qty 1

## 2014-06-19 MED ORDER — ACETAMINOPHEN 650 MG RE SUPP
650.0000 mg | Freq: Four times a day (QID) | RECTAL | Status: DC | PRN
  Administered 2014-06-24: 650 mg via RECTAL
  Filled 2014-06-19: qty 1

## 2014-06-19 MED ORDER — PIPERACILLIN-TAZOBACTAM 3.375 G IVPB
3.3750 g | Freq: Three times a day (TID) | INTRAVENOUS | Status: DC
  Administered 2014-06-19: 3.375 g via INTRAVENOUS
  Filled 2014-06-19 (×2): qty 50

## 2014-06-19 MED ORDER — DEXTROSE 5 % IV SOLN
600.0000 mg | Freq: Two times a day (BID) | INTRAVENOUS | Status: DC
  Administered 2014-06-20 – 2014-06-21 (×3): 600 mg via INTRAVENOUS
  Filled 2014-06-19 (×5): qty 12

## 2014-06-19 MED ORDER — ALUM & MAG HYDROXIDE-SIMETH 200-200-20 MG/5ML PO SUSP: 30.0000 mL | Freq: Four times a day (QID) | ORAL | Status: DC | PRN

## 2014-06-19 MED ORDER — ONDANSETRON HCL 4 MG PO TABS: 4.0000 mg | ORAL_TABLET | Freq: Four times a day (QID) | ORAL | Status: DC | PRN

## 2014-06-19 MED ORDER — ALBUTEROL SULFATE (2.5 MG/3ML) 0.083% IN NEBU: 2.5000 mg | INHALATION_SOLUTION | Freq: Four times a day (QID) | RESPIRATORY_TRACT | Status: DC | PRN

## 2014-06-19 MED ORDER — SODIUM CHLORIDE 0.9 % IV SOLN
2.0000 g | Freq: Four times a day (QID) | INTRAVENOUS | Status: DC
  Administered 2014-06-20 (×2): 2 g via INTRAVENOUS
  Filled 2014-06-19 (×5): qty 2000

## 2014-06-19 MED ORDER — ACETAMINOPHEN 650 MG RE SUPP
650.0000 mg | Freq: Once | RECTAL | Status: AC
  Administered 2014-06-19: 650 mg via RECTAL
  Filled 2014-06-19: qty 1

## 2014-06-19 MED ORDER — NICOTINE 21 MG/24HR TD PT24
21.0000 mg | MEDICATED_PATCH | Freq: Every day | TRANSDERMAL | Status: DC
  Administered 2014-06-25: 21 mg via TRANSDERMAL
  Filled 2014-06-19 (×3): qty 1

## 2014-06-19 NOTE — ED Notes (Signed)
Attempted report 

## 2014-06-19 NOTE — Progress Notes (Signed)
ANTIBIOTIC CONSULT NOTE - INITIAL  Pharmacy Consult for ceftriaxone, acyclovir, vancomycin, and ampicillin Indication: r/o meningitis  No Known Allergies  Patient Measurements: Height: 5' 6"  (167.6 cm) Weight: 165 lb (74.844 kg) IBW/kg (Calculated) : 59.3  Vital Signs: Temp: 100.8 F (38.2 C) (03/24 1817) Temp Source: Rectal (03/24 1817) BP: 184/57 mmHg (03/24 1900) Pulse Rate: 85 (03/24 1900)  Labs:  Recent Labs  06/19/14 1234  WBC 16.1*  HGB 12.5  PLT 204  CREATININE 1.21*   Estimated Creatinine Clearance: 43.5 mL/min (by C-G formula based on Cr of 1.21).   Medical History: Past Medical History  Diagnosis Date  . CHF (congestive heart failure)   . Hypertension   . Back pain, chronic   . Hip pain, chronic   . GERD (gastroesophageal reflux disease)   . Crohn disease   . Diabetes mellitus without complication     Medications:  Prescriptions prior to admission  Medication Sig Dispense Refill Last Dose  . Cholecalciferol (VITAMIN D) 2000 UNITS CAPS Take 1 capsule by mouth daily.   06/18/2014 at Unknown time  . cloNIDine (CATAPRES) 0.1 MG tablet Take 1 tablet (0.1 mg total) by mouth 2 (two) times daily. 60 tablet 11 unk at Unknown time  . folic acid (FOLVITE) 1 MG tablet Take 1 mg by mouth daily.   06/18/2014 at Unknown time  . gabapentin (NEURONTIN) 600 MG tablet Take 600 mg by mouth 3 (three) times daily.    06/18/2014 at Unknown time  . hydrALAZINE (APRESOLINE) 50 MG tablet Take 1 tablet (50 mg total) by mouth every 8 (eight) hours. 90 tablet 0 06/18/2014 at Unknown time  . HYDROcodone-acetaminophen (NORCO) 10-325 MG per tablet Take 1 tablet by mouth every 6 (six) hours as needed (for pain.).    06/18/2014 at Unknown time  . lisinopril (PRINIVIL,ZESTRIL) 10 MG tablet Take 1 tablet (10 mg total) by mouth daily.   06/18/2014 at Unknown time  . omeprazole (PRILOSEC) 20 MG capsule Take 20 mg by mouth every morning.    06/18/2014 at Unknown time  . ondansetron (ZOFRAN) 4 MG  tablet Take 1 tablet (4 mg total) by mouth every 6 (six) hours as needed for nausea. 20 tablet 0 06/18/2014 at Unknown time  . potassium chloride SA (K-DUR,KLOR-CON) 20 MEQ tablet Take 20 mEq by mouth daily.   06/18/2014 at Unknown time  . PROAIR HFA 108 (90 BASE) MCG/ACT inhaler Inhale 1 puff into the lungs every 6 (six) hours as needed. Shortness of breath or wheezing  3 unk at Unknown time  . simvastatin (ZOCOR) 40 MG tablet Take 40 mg by mouth at bedtime.     06/18/2014 at Unknown time  . sitaGLIPtin (JANUVIA) 100 MG tablet Take 50 mg by mouth every morning.    06/18/2014 at Unknown time  . spironolactone (ALDACTONE) 25 MG tablet Take 25 mg by mouth every morning.    06/18/2014 at Unknown time  . sulfaSALAzine (AZULFIDINE) 500 MG tablet Take 500 mg by mouth 4 (four) times daily.   06/18/2014 at Unknown time  . traZODone (DESYREL) 100 MG tablet Take 200 mg by mouth at bedtime.   0 unk at Unknown time   Scheduled:  . famotidine (PEPCID) IV  20 mg Intravenous Q24H  . heparin  5,000 Units Subcutaneous 3 times per day  . insulin aspart  0-15 Units Subcutaneous 6 times per day  . nicotine  21 mg Transdermal Daily  . sodium chloride  3 mL Intravenous Q12H  . [START ON  06/20/2014] vancomycin  1,250 mg Intravenous Q24H   Infusions:  . sodium chloride 0.9 % 1,000 mL with potassium chloride 40 mEq infusion 75 mL/hr at 06/19/14 2253    Assessment: 74yo female was found unresponsive by granddaughter, found w/ leukocytosis and UTI, started on IV ABX for sepsis thought to be 2/2 complicated UTI, now concern for meningitis, to broaden IV ABX.  Goal of Therapy:  Vancomycin trough level 15-20 mcg/ml  Plan:  Will continue vancomycin as ordered for sepsis; Zosyn has been d/c'd; will begin Rocephin 2g IV Q12H, ampicillin 2g IV Q6H, and acyclovir 631m IV Q12H and monitor CBC, Cx, levels prn.  VWynona Neat PharmD, BCPS  06/19/2014,10:55 PM

## 2014-06-19 NOTE — ED Notes (Signed)
Lab results reported to [place].

## 2014-06-19 NOTE — ED Notes (Signed)
PA at bedside when Narcan administered, no response from patient

## 2014-06-19 NOTE — Progress Notes (Addendum)
ANTIBIOTIC CONSULT NOTE - INITIAL  Pharmacy Consult:  Vancomycin / Zosyn Indication:  Sepsis  No Known Allergies  Patient Measurements: Height = 66 inches Weight = 75.2 kg  Vital Signs: Temp: 102.5 F (39.2 C) (03/24 1147) Temp Source: Rectal (03/24 1147) BP: 162/84 mmHg (03/24 1147) Pulse Rate: 104 (03/24 1147)  Labs: No results for input(s): WBC, HGB, PLT, LABCREA, CREATININE in the last 72 hours. Estimated Creatinine Clearance: 49.3 mL/min (by C-G formula based on Cr of 1.07). No results for input(s): VANCOTROUGH, VANCOPEAK, VANCORANDOM, GENTTROUGH, GENTPEAK, GENTRANDOM, TOBRATROUGH, TOBRAPEAK, TOBRARND, AMIKACINPEAK, AMIKACINTROU, AMIKACIN in the last 72 hours.   Microbiology: No results found for this or any previous visit (from the past 720 hour(s)).  Medical History: Past Medical History  Diagnosis Date  . CHF (congestive heart failure)   . Hypertension   . Back pain, chronic   . Hip pain, chronic   . GERD (gastroesophageal reflux disease)   . Crohn disease   . Diabetes mellitus without complication       Assessment: 43 YOF discharged on 06/16/14 s/p treatment for gastroenteritis.  Patient's granddaughter found her on the floor PTA.  Pharmacy consulted to initiate vancomycin and Zosyn for sepsis.  First doses of antibiotics already ordered.  Baseline labs reviewed.   Goal of Therapy:  Vancomycin trough level 15-20 mcg/ml   Plan:  - Vanc 511m IV x 1 for a total of 15068mtoday (RN aware), then 125043mV Q24H - Zosyn 3.375gm IV Q8H, 4 hr infusion - Monitor renal fxn, clinical progress, vanc trough as indicated    Ani Deoliveira D. DanMina MarbleharmD, BCPS Pager:  [phone]/2016, 1:48 PM

## 2014-06-19 NOTE — ED Notes (Addendum)
CG-4 result reported to Dr. Tamera Punt

## 2014-06-19 NOTE — H&P (Signed)
[facility] History and Physical                                                                                    Michelle Farrell, is a 74 y.o. female  MRN: [id number]   DOB - 1940-08-05  Admit Date - 06/19/2014  Outpatient Primary MD for the patient is Michelle Medicus, MD  With History of -  Past Medical History  Diagnosis Date  . CHF (congestive heart failure)   . Hypertension   . Back pain, chronic   . Hip pain, chronic   . GERD (gastroesophageal reflux disease)   . Crohn disease   . Diabetes mellitus without complication       Past Surgical History  Procedure Laterality Date  . Abdominal hysterectomy    . Tonsillectomy    . Cholecystectomy    . Bladder surgery      in for   Chief Complaint  Patient presents with  . Altered Mental Status     HPI  Michelle Farrell  is a 74 y.o. female, with a pmh of heart failure (unspecified), diabetes mellitus, chronic hip and back and leg pain, hypertension, and well-controlled Crohn's disease. She has had 2 recent hospitalizations in the last 4.5 weeks. She was discharged on February 21 after suffering with nausea, vomiting, diarrhea, and acute renal failure. She was then discharged on March 21 after having nausea, vomiting, diarrhea and acute renal failure. The patient is currently lethargic and not speaking. Consequently history was collected from her granddaughter [name] who is a Designer, jewellery at  [employer] [facility] [employer]. [name] relates that her grandmother was not completely well when she left the hospital. She has been having low-grade temperatures and odorous urine. Yesterday she complained of abdominal pain and had developed a slight cough. [name] states she saw her grandmother last night at approximately 9 PM. She was slightly altered at that time, but [name] attributed it to having taken pain and sleep medications. This morning her grandmother did not answer the phone. [name] went to her home at approximately 10:15 am and found her  unresponsive on the floor with her head up against the bed rail. EMS was called   In the ER the patient is not responsive and tremoring, white count is 16.1, temperature 102.5, respirations 31, lactic acid was elevated at 2.59. Urinalysis is positive for infection. The patient is being admitted to stepdown for severe sepsis with acute encephalopathy.    In addition to the HPI above,   No Headache, No changes with Vision or hearing, No problems swallowing food or Liquids, No Chest pain, Cough or Shortness of Breath, No Nausea or Vomiting, Bowel movements are regular, No Blood in stool or Urine, No new skin rashes or bruises, No new joints pains-aches,  No new weakness, tingling, numbness in any extremity, No recent weight gain or loss, A full 10 point Review of Systems was done, except as stated above, all other Review of Systems were negative.  Social History History  Substance Use Topics  . Smoking status: Current Every Day Smoker  . Smokeless tobacco: Never Used  . Alcohol Use: No  patient has smoked 2 PPD for  50 years.  Lives alone.  Independent of ADLs.  Walks with walker/cane.  Grand daughter checks on her frequently.  Family History Family History  Problem Relation Age of Onset  . Diabetes Mellitus II Other   Patient is unable to provide family history.  Prior to Admission medications   Medication Sig Start Date End Date Taking? Authorizing Provider  Cholecalciferol (VITAMIN D) 2000 UNITS CAPS Take 1 capsule by mouth daily.   Yes Historical Provider, MD  cloNIDine (CATAPRES) 0.1 MG tablet Take 1 tablet (0.1 mg total) by mouth 2 (two) times daily. 08/31/13  Yes Maryann Mikhail, DO  folic acid (FOLVITE) 1 MG tablet Take 1 mg by mouth daily.   Yes Historical Provider, MD  gabapentin (NEURONTIN) 600 MG tablet Take 600 mg by mouth 3 (three) times daily.    Yes Historical Provider, MD  hydrALAZINE (APRESOLINE) 50 MG tablet Take 1 tablet (50 mg total) by mouth every 8 (eight)  hours. 08/31/13  Yes Maryann Mikhail, DO  HYDROcodone-acetaminophen (NORCO) 10-325 MG per tablet Take 1 tablet by mouth every 6 (six) hours as needed (for pain.).    Yes Historical Provider, MD  lisinopril (PRINIVIL,ZESTRIL) 10 MG tablet Take 1 tablet (10 mg total) by mouth daily. 06/16/14  Yes Velvet Bathe, MD  metoCLOPramide (REGLAN) 10 MG tablet Take 10 mg by mouth 4 (four) times daily. 06/29/13  Yes Historical Provider, MD  omeprazole (PRILOSEC) 20 MG capsule Take 20 mg by mouth every morning.    Yes Historical Provider, MD  ondansetron (ZOFRAN) 4 MG tablet Take 1 tablet (4 mg total) by mouth every 6 (six) hours as needed for nausea. 06/16/14  Yes Velvet Bathe, MD  potassium chloride SA (K-DUR,KLOR-CON) 20 MEQ tablet Take 20 mEq by mouth daily.   Yes Historical Provider, MD  PROAIR HFA 108 (90 BASE) MCG/ACT inhaler Inhale 1 puff into the lungs every 6 (six) hours as needed. Shortness of breath or wheezing 03/03/14  Yes Historical Provider, MD  simvastatin (ZOCOR) 40 MG tablet Take 40 mg by mouth at bedtime.     Yes Historical Provider, MD  sitaGLIPtin (JANUVIA) 100 MG tablet Take 50 mg by mouth every morning.    Yes Historical Provider, MD  spironolactone (ALDACTONE) 25 MG tablet Take 25 mg by mouth every morning.    Yes Historical Provider, MD  sulfaSALAzine (AZULFIDINE) 500 MG tablet Take 500 mg by mouth 4 (four) times daily.   Yes Historical Provider, MD  traZODone (DESYREL) 100 MG tablet Take 200 mg by mouth at bedtime.  04/17/14  Yes Historical Provider, MD    No Known Allergies  Physical Exam  Vitals  Blood pressure 163/49, pulse 80, temperature 101.5 F (38.6 C), temperature source Rectal, resp. rate 28, height 5' 6"  (1.676 m), weight 74.844 kg (165 lb), SpO2 95 %.   General:  AAF, lying in bed, does not respond to voice or touch.  She did open her eyes when I rolled her on her side, but still did not speak.  Neuro:   Unable to assess as patient is unresponsive, but she is able to move  all four extremities.  ENT:  Eyes appear to roll back in her head and to the left, Conjunctivae clear, PER. Moist oral mucosa without erythema or exudates.  Neck:  Supple, No lymphadenopathy appreciated  Respiratory:  Symmetrical chest wall movement, Good air movement bilaterally, CTAB.  Cardiac:  RRR, No Murmurs, no LE edema noted, no JVD.    Abdomen:  Positive bowel sounds,  Soft, Non tender, Non distended,  No masses appreciated  Skin:  No Cyanosis, Normal Skin Turgor, No Skin Rash or Bruise.  Extremities:  Able to move all 4. Patient is stiff in all extremities & neck.  Data Review  CBC  Recent Labs Lab 06/14/14 0521 06/19/14 1234  WBC 8.6 16.1*  HGB 10.4* 12.5  HCT 30.6* 35.8*  PLT 160 204  MCV 90.0 88.0  MCH 30.6 30.7  MCHC 34.0 34.9  RDW 14.6 14.4  LYMPHSABS  --  0.9  MONOABS  --  1.7*  EOSABS  --  0.0  BASOSABS  --  0.0    Chemistries   Recent Labs Lab 06/13/14 0522 06/14/14 0521 06/19/14 1234  NA 143 141 139  K 4.0 3.6 3.9  CL 112 112 102  CO2 16* 20 25  GLUCOSE 154* 128* 201*  BUN 12 8 8   CREATININE 1.19* 1.07 1.21*  CALCIUM 10.5 10.0 10.3  AST  --   --  32  ALT  --   --  18  ALKPHOS  --   --  65  BILITOT  --   --  0.5    Urinalysis    Component Value Date/Time   COLORURINE YELLOW 06/19/2014 1432   APPEARANCEUR HAZY* 06/19/2014 1432   LABSPEC 1.016 06/19/2014 1432   PHURINE 5.0 06/19/2014 1432   GLUCOSEU NEGATIVE 06/19/2014 1432   HGBUR MODERATE* 06/19/2014 1432   BILIRUBINUR NEGATIVE 06/19/2014 1432   KETONESUR NEGATIVE 06/19/2014 1432   PROTEINUR 100* 06/19/2014 1432   UROBILINOGEN 0.2 06/19/2014 1432   NITRITE POSITIVE* 06/19/2014 1432   LEUKOCYTESUR SMALL* 06/19/2014 1432    Imaging results:   Ct Head Wo Contrast  06/19/2014   CLINICAL DATA:  Altered mental status, nonverbal with fixed right gaze.  EXAM: CT HEAD WITHOUT CONTRAST  TECHNIQUE: Contiguous axial images were obtained from the base of the skull through the vertex  without intravenous contrast.  COMPARISON:  None.  FINDINGS: Skull and Sinuses:No fracture or aggressive lesion. Fluid level in the right maxillary sinus and mild scattered inflammatory mucosal thickening is likely incidental given the history.  Orbits: Rightward deviation of gaze.  Brain: No evidence of acute infarction, hemorrhage, hydrocephalus, or mass lesion/mass effect. The insular ribbon on the left is somewhat indistinct inferiorly, but symmetric. Mild periventricular and subcortical left occipital low-density is most likely chronic small vessel ischemia.  IMPRESSION: 1. No acute intracranial findings. 2. Mild chronic small vessel ischemia.   Electronically Signed   By: Monte Fantasia M.D.   On: 06/19/2014 14:50   Dg Chest Port 1 View  06/19/2014   CLINICAL DATA:  74 year old female with altered mental status, found down this morning. Recent hospitalization for gastroenteritis. Initial encounter.  EXAM: PORTABLE CHEST - 1 VIEW  COMPARISON:  CT Abdomen and Pelvis 05/16/2014, and earlier.  FINDINGS: Portable AP semi upright view at 1241 hours. Lower lung volumes. Normal cardiac size and mediastinal contours. Allowing for portable technique, the lungs are clear. No pneumothorax or pleural effusion identified.  IMPRESSION: No acute cardiopulmonary abnormality.   Electronically Signed   By: Genevie Ann M.D.   On: 06/19/2014 13:25    My personal review of EKG: sinus tach with left fas block.  QTC 445.   Assessment & Plan  Principal Problem:   Sepsis Active Problems:   Acute encephalopathy   DM (diabetes mellitus)   UTI (lower urinary tract infection)   Crohn disease   Pelvic mass   Back pain, chronic  Tobacco abuse   74 yo female from home with 2 recent hospitalizations for N/V/D and ARF.  Patient was found down in her bedroom 3/25 am unresponsive.  She has severe sepsis with acute encephalopathy and will be admitted to step down. Neurology has been consulted.   Severe Sepsis with Acute  encephalopathy. Likely secondary to UTI, but will check a flu PCR given fever and cough. CXR negative.  U/A positive. Received Sepsis fluid resuscitation in the ER.  Blood and urine cultures pending. Placed on [place] / Zosyn.  Acute Encephalopathy Patient's baseline is alert, conversant, and independent of ADLs.   Possible etiology UTI.  Gave Narcan in ER with no improvement in symptoms. Patient made NPO.  Please start diet and PO meds with mental status improves. Body stiffness and eye movements are concerning.  We have requested neurology evaluate her for other possible etiologies.  UTI Patient started on vanc/zosyn in ER. Culture is pending.   Pls narrow antibiotics quickly based on culture results.  Dehydration Will give gentle IV fluids for 12 hours. Holding spironolactone.  S/P fall CT head is negative. CT Cervical spine is negative for fracture or subluxation. PT evaluation ordered.  Mildly elevated creatinine.   Baseline 1.07.  Now 1.21. Gentle hydration.  AM bmet.  Heart failure unspecified  2D echo ordered. Currently not volume overloaded (appears dry). Holding spironolactone.  DM Currently NPO.  Will place on q 4 hour CBGs and SSI moderate.  HTN Currently NPO.  IV hydralazine PRN.  On lisinopril, hydralazine, clonidine at home. Per pharmacy reports patient has clonidine ordered via mail order pharmacy but she has not taken it yet. Please determine whether or not she should take it on discharge.  Chronic pain Narcotic dependent.  Holding all narcotics as patient is very altered. Please re-evaluate on rounds in AM.  Concerned for possible withdraw symptoms. Gabapentin held as well.  Tobacco abuse. Nicotine patch ordered.  Pelvic mass. Noted on CT Abdomen 05/16/14 7.8 x 7.3 cm cystic lesion Patient needs outpatient follow up and Cystogram.  Carotid artery calcifications  Noted on CT Cervical spine.  Carotid dopplers ordered.  Please note:  Pharmacy reports  patient has mail ordered Trazodone, Clonidine, and Reglan, but has not received or taken them yet.   Please address these with her at discharge.  DVT Prophylaxis:  heparin  AM Labs Ordered, also please review Full Orders  Family Communication:   Daughter and grand daughter at bedside.  Code Status:  full  Condition:  Guarded. Time spent in minutes : [address],  PA-C on 06/19/2014 at 4:57 PM  Between 7am to 7pm - Pager - [phone]  After 7pm go to [email or web address] - password [email or web address]  And look for the night coverage person covering me after hours  [facility]

## 2014-06-19 NOTE — ED Notes (Addendum)
Per EMS, patient was just d/c Monday for gastroenteritis. She was last seen normal last night by family. When granddaughter went to check on her this AM around 1030, she found her on the floor next bed. Pt. Baseline is talking and ambulatory. Pt. Found nonverbal with right sided gaze. Withdraws to pain.

## 2014-06-19 NOTE — ED Notes (Signed)
Patient transported to CT 

## 2014-06-19 NOTE — ED Notes (Signed)
MD at bedside. 

## 2014-06-19 NOTE — ED Provider Notes (Signed)
CSN: [id number]     Arrival date & time 06/19/14  1140 History   First MD Initiated Contact with Patient 06/19/14 1144     Chief Complaint  Patient presents with  . Altered Mental Status     (Consider location/radiation/quality/duration/timing/severity/associated sxs/prior Treatment) HPI Comments: Patient presents with altered mental status. She was recently admitted to the hospital and discharged on March 21. She was admitted for gastroenteritis and acute kidney injury. She lives at home alone and is normally alert and oriented and ambulates without assistance. Her granddaughter who lives nearby checked on her last night and said that she was doing okay. She's been complaining of some ongoing abdominal discomfort which is been unchanged over the last month or so. This morning her granddaughter tried to call her several times and wasn't getting an answer so she went over to check on her and found her laying on the floor next to her bed. She was unresponsive. She appeared to have a right-sided gaze. There is a question of some swelling to the left side of her face from trauma.  Patient is a 74 y.o. female presenting with altered mental status.  Altered Mental Status   Past Medical History  Diagnosis Date  . CHF (congestive heart failure)   . Hypertension   . Back pain, chronic   . Hip pain, chronic   . GERD (gastroesophageal reflux disease)   . Crohn disease   . Diabetes mellitus without complication    Past Surgical History  Procedure Laterality Date  . Abdominal hysterectomy    . Tonsillectomy    . Cholecystectomy    . Bladder surgery     Family History  Problem Relation Age of Onset  . Diabetes Mellitus II Other    History  Substance Use Topics  . Smoking status: Current Every Day Smoker  . Smokeless tobacco: Never Used  . Alcohol Use: No   OB History    No data available     Review of Systems  Unable to perform ROS: Mental status change      Allergies  Review  of patient's allergies indicates no known allergies.  Home Medications   Prior to Admission medications   Medication Sig Start Date End Date Taking? Authorizing Provider  Cholecalciferol (VITAMIN D) 2000 UNITS CAPS Take 1 capsule by mouth daily.   Yes Historical Provider, MD  cloNIDine (CATAPRES) 0.1 MG tablet Take 1 tablet (0.1 mg total) by mouth 2 (two) times daily. 08/31/13  Yes Maryann Mikhail, DO  folic acid (FOLVITE) 1 MG tablet Take 1 mg by mouth daily.   Yes Historical Provider, MD  gabapentin (NEURONTIN) 600 MG tablet Take 600 mg by mouth 3 (three) times daily.    Yes Historical Provider, MD  hydrALAZINE (APRESOLINE) 50 MG tablet Take 1 tablet (50 mg total) by mouth every 8 (eight) hours. 08/31/13  Yes Maryann Mikhail, DO  HYDROcodone-acetaminophen (NORCO) 10-325 MG per tablet Take 1 tablet by mouth every 6 (six) hours as needed (for pain.).    Yes Historical Provider, MD  lisinopril (PRINIVIL,ZESTRIL) 10 MG tablet Take 1 tablet (10 mg total) by mouth daily. 06/16/14  Yes Velvet Bathe, MD  omeprazole (PRILOSEC) 20 MG capsule Take 20 mg by mouth every morning.    Yes Historical Provider, MD  ondansetron (ZOFRAN) 4 MG tablet Take 1 tablet (4 mg total) by mouth every 6 (six) hours as needed for nausea. 06/16/14  Yes Velvet Bathe, MD  potassium chloride SA (K-DUR,KLOR-CON) 20 MEQ tablet Take  20 mEq by mouth daily.   Yes Historical Provider, MD  PROAIR HFA 108 (90 BASE) MCG/ACT inhaler Inhale 1 puff into the lungs every 6 (six) hours as needed. Shortness of breath or wheezing 03/03/14  Yes Historical Provider, MD  simvastatin (ZOCOR) 40 MG tablet Take 40 mg by mouth at bedtime.     Yes Historical Provider, MD  sitaGLIPtin (JANUVIA) 100 MG tablet Take 50 mg by mouth every morning.    Yes Historical Provider, MD  spironolactone (ALDACTONE) 25 MG tablet Take 25 mg by mouth every morning.    Yes Historical Provider, MD  sulfaSALAzine (AZULFIDINE) 500 MG tablet Take 500 mg by mouth 4 (four) times daily.    Yes Historical Provider, MD  traZODone (DESYREL) 100 MG tablet Take 200 mg by mouth at bedtime.  04/17/14  Yes Historical Provider, MD   BP 163/49 mmHg  Pulse 80  Temp(Src) 101.5 F (38.6 C) (Rectal)  Resp 28  Ht 5' 6"  (1.676 m)  Wt 165 lb (74.844 kg)  BMI 26.64 kg/m2  SpO2 95% Physical Exam  Constitutional: She appears well-developed and well-nourished.  HENT:  Head: Normocephalic.  Minimal swelling to the left forehead  Eyes: Conjunctivae are normal. Pupils are equal, round, and reactive to light.  Neck: Normal range of motion. Neck supple.  Cardiovascular: Normal rate, regular rhythm and normal heart sounds.   Pulmonary/Chest: Effort normal and breath sounds normal. No respiratory distress. She has no wheezes. She has no rales. She exhibits no tenderness.  Abdominal: Soft. Bowel sounds are normal. There is no tenderness. There is no rebound and no guarding.  Musculoskeletal: Normal range of motion. She exhibits no edema.  Lymphadenopathy:    She has no cervical adenopathy.  Neurological:  Patient is awake with eyes open but is noncommunicative. Her mouth is open. She is gazing to the right but does not appear to have neglect. She seems to have normal withdrawal in all extremities.   Skin: Skin is warm and dry. No rash noted.  Psychiatric: She has a normal mood and affect.    ED Course  Procedures (including critical care time) Labs Review Labs Reviewed  CBC WITH DIFFERENTIAL/PLATELET - Abnormal; Notable for the following:    WBC 16.1 (*)    HCT 35.8 (*)    Neutrophils Relative % 83 (*)    Neutro Abs 13.5 (*)    Lymphocytes Relative 6 (*)    Monocytes Absolute 1.7 (*)    All other components within normal limits  COMPREHENSIVE METABOLIC PANEL - Abnormal; Notable for the following:    Glucose, Bld 201 (*)    Creatinine, Ser 1.21 (*)    GFR calc non Af Amer 44 (*)    GFR calc Af Amer 51 (*)    All other components within normal limits  URINALYSIS, ROUTINE W REFLEX  MICROSCOPIC - Abnormal; Notable for the following:    APPearance HAZY (*)    Hgb urine dipstick MODERATE (*)    Protein, ur 100 (*)    Nitrite POSITIVE (*)    Leukocytes, UA SMALL (*)    All other components within normal limits  URINE MICROSCOPIC-ADD ON - Abnormal; Notable for the following:    Bacteria, UA MANY (*)    Casts HYALINE CASTS (*)    All other components within normal limits  I-STAT CG4 LACTIC ACID, ED - Abnormal; Notable for the following:    Lactic Acid, Venous 2.59 (*)    All other components within normal limits  I-STAT CG4 LACTIC ACID, ED - Abnormal; Notable for the following:    Lactic Acid, Venous 2.08 (*)    All other components within normal limits  CULTURE, BLOOD (ROUTINE X 2)  CULTURE, BLOOD (ROUTINE X 2)  URINE CULTURE  CK  I-STAT CG4 LACTIC ACID, ED    Imaging Review Ct Head Wo Contrast  06/19/2014   CLINICAL DATA:  Altered mental status, nonverbal with fixed right gaze.  EXAM: CT HEAD WITHOUT CONTRAST  TECHNIQUE: Contiguous axial images were obtained from the base of the skull through the vertex without intravenous contrast.  COMPARISON:  None.  FINDINGS: Skull and Sinuses:No fracture or aggressive lesion. Fluid level in the right maxillary sinus and mild scattered inflammatory mucosal thickening is likely incidental given the history.  Orbits: Rightward deviation of gaze.  Brain: No evidence of acute infarction, hemorrhage, hydrocephalus, or mass lesion/mass effect. The insular ribbon on the left is somewhat indistinct inferiorly, but symmetric. Mild periventricular and subcortical left occipital low-density is most likely chronic small vessel ischemia.  IMPRESSION: 1. No acute intracranial findings. 2. Mild chronic small vessel ischemia.   Electronically Signed   By: Monte Fantasia M.D.   On: 06/19/2014 14:50   Dg Chest Port 1 View  06/19/2014   CLINICAL DATA:  74 year old female with altered mental status, found down this morning. Recent hospitalization for  gastroenteritis. Initial encounter.  EXAM: PORTABLE CHEST - 1 VIEW  COMPARISON:  CT Abdomen and Pelvis 05/16/2014, and earlier.  FINDINGS: Portable AP semi upright view at 1241 hours. Lower lung volumes. Normal cardiac size and mediastinal contours. Allowing for portable technique, the lungs are clear. No pneumothorax or pleural effusion identified.  IMPRESSION: No acute cardiopulmonary abnormality.   Electronically Signed   By: Genevie Ann M.D.   On: 06/19/2014 13:25     EKG Interpretation   Date/Time:  Thursday June 19 2014 11:52:47 EDT Ventricular Rate:  100 PR Interval:  140 QRS Duration: 93 QT Interval:  345 QTC Calculation: 445 R Axis:   -50 Text Interpretation:  Sinus tachycardia Left anterior fascicular block  Consider left ventricular hypertrophy SINCE LAST TRACING HEART RATE HAS  INCREASED Confirmed by Guilianna Mckoy  MD, Gwyn Hieronymus ([id number]) on 06/19/2014 3:29:58 PM      MDM   Final diagnoses:  Altered mental status  Fall  Sepsis, due to unspecified organism  UTI (lower urinary tract infection)    Patient presents with altered mental status. She does seem to have some rightward gaze but I don't find any evidence of neglect or unilateral deficits. She does have a fever and meets criteria for sepsis. Sepsis protocol was started and she was given fluid boluses and started on broad-spectrum antibiotics. On further evaluation she was found to have a UTI which is likely the etiology for her sepsis. Chest x-ray is clear. CT of the head is negative for intracranial hemorrhage. Her blood pressures been stable. I will consult the hospitalist for admission.    Malvin Johns, MD 06/19/14 [phone]

## 2014-06-19 NOTE — Consult Note (Signed)
Neurology Consultation Reason for Consult: Altered Mental Status Referring Physician: Hongalgi, A  CC: Altered Mental Status  History is obtained from:Daughter  HPI: Michelle Farrell is a 74 y.o. female who was noticed to be abnormal last night, but this was attributed to her "night meds." Today, when the granddaughter found her, she was on the floor and unresponsive. She was brought to the ED where she was found to be febrile with a white count. UA shows 3- 6 WBC with + nitrites. Urosepsis was presumed and she was started on vanc/zosyn. Per daughter, she has had some improvement, but continues to be quite altered.   Family also reports occasional twitching, though none was observed on my exam.    LKW: 3/23 tpa given?: no, out of window.     ROS:  Unable to obtain due to altered mental status.   Past Medical History  Diagnosis Date  . CHF (congestive heart failure)   . Hypertension   . Back pain, chronic   . Hip pain, chronic   . GERD (gastroesophageal reflux disease)   . Crohn disease   . Diabetes mellitus without complication     Family History: Unable to assess secondary to patient's altered mental status.    Social History: Tob: Unable to assess secondary to patient's altered mental status.    Exam: Current vital signs: BP 184/57 mmHg  Pulse 85  Temp(Src) 100.8 F (38.2 C) (Rectal)  Resp 21  Ht 5' 6"  (1.676 m)  Wt 74.844 kg (165 lb)  BMI 26.64 kg/m2  SpO2 97% Vital signs in last 24 hours: Temp:  [100.8 F (38.2 C)-102.5 F (39.2 C)] 100.8 F (38.2 C) (03/24 1817) Pulse Rate:  [80-104] 85 (03/24 1900) Resp:  [16-31] 21 (03/24 1900) BP: (144-189)/(46-97) 184/57 mmHg (03/24 1900) SpO2:  [93 %-99 %] 97 % (03/24 1900) Weight:  [74.844 kg (165 lb)] 74.844 kg (165 lb) (03/24 1310)   Physical Exam  Constitutional: Appears well-developed and well-nourished.  Psych: does not answer questions.  Eyes: No scleral injection HENT: No OP obstrucion Head:  Normocephalic.  Cardiovascular: Normal rate and regular rhythm.  Respiratory: Effort normal  GI: Soft.  No distension. There is no tenderness.  Skin: WDI  Neuro: Mental Status: Patient is lethargic but does awaken to noxious stimuli. She only says "eh?" to any question, and does not follow commands.  Cranial Nerves: II: does not blink to threat. Pupils are equal, round, and reactive to light.   III,IV, VI: left gaze preference but not deviated.  V: VII: blinks to eyelid stim bilaterally VIII, X, XI, XII: Unable to assess secondary to patient's altered mental status.  Motor: Tone is normal. Bulk is normal. Moves all extremities well voluntarily.  Sensory: Responds to stim x 4.  Deep Tendon Reflexes: 2+ and symmetric in the biceps and patellae.  Cerebellar: Does not comply  +meningismus.    I have reviewed labs in epic and the results pertinent to this consultation are: Elevated WBC  I have reviewed the images obtained:CT head - negative.   Impression: 74 yo F with febrile AMS and neck stiffness. I am concerned for meningitis or encephalitis. I attempted an LP at the bedside which was unfortunately unsuccessful. I would favor covering her with empiric antibiotics until an LP can be performed under fluoro. Also, the twitchign family describes could be concerning for seizure, but no clear signs of ongoing seizure. Will start empiric keppra for now.   Recommendations: 1) Keppra 1g now, then 574m BID  2) Ampicillin, ceftriaxone, vanc, acyclovir for broad CNS coverage 3) LP under fluoro ordered.  4) EEG 5) Neurology will continue to follow.    Roland Rack, MD [facility] [phone]  If 7pm- 7am, please page neurology on call as listed in [place].

## 2014-06-20 ENCOUNTER — Inpatient Hospital Stay (HOSPITAL_COMMUNITY): Payer: Medicare Other

## 2014-06-20 DIAGNOSIS — I6330 Cerebral infarction due to thrombosis of unspecified cerebral artery: Secondary | ICD-10-CM

## 2014-06-20 DIAGNOSIS — I509 Heart failure, unspecified: Secondary | ICD-10-CM

## 2014-06-20 DIAGNOSIS — I639 Cerebral infarction, unspecified: Secondary | ICD-10-CM | POA: Insufficient documentation

## 2014-06-20 LAB — INFLUENZA PANEL BY PCR (TYPE A & B)
H1N1FLUPCR: NOT DETECTED
INFLAPCR: NEGATIVE
INFLBPCR: NEGATIVE

## 2014-06-20 LAB — GRAM STAIN

## 2014-06-20 LAB — CSF CELL COUNT WITH DIFFERENTIAL
Eosinophils, CSF: 0 % (ref 0–1)
Lymphs, CSF: 16 % — ABNORMAL LOW (ref 40–80)
Monocyte-Macrophage-Spinal Fluid: 5 % — ABNORMAL LOW (ref 15–45)
RBC Count, CSF: 11 /mm3 — ABNORMAL HIGH
Segmented Neutrophils-CSF: 79 % — ABNORMAL HIGH (ref 0–6)
TUBE #: 3
WBC, CSF: 28 /mm3 (ref 0–5)

## 2014-06-20 LAB — CBC
HCT: 31.9 % — ABNORMAL LOW (ref 36.0–46.0)
Hemoglobin: 11.2 g/dL — ABNORMAL LOW (ref 12.0–15.0)
MCH: 30.4 pg (ref 26.0–34.0)
MCHC: 35.1 g/dL (ref 30.0–36.0)
MCV: 86.7 fL (ref 78.0–100.0)
Platelets: 180 10*3/uL (ref 150–400)
RBC: 3.68 MIL/uL — ABNORMAL LOW (ref 3.87–5.11)
RDW: 14.4 % (ref 11.5–15.5)
WBC: 15.2 10*3/uL — AB (ref 4.0–10.5)

## 2014-06-20 LAB — BASIC METABOLIC PANEL
ANION GAP: 8 (ref 5–15)
BUN: 6 mg/dL (ref 6–23)
CALCIUM: 9.1 mg/dL (ref 8.4–10.5)
CHLORIDE: 107 mmol/L (ref 96–112)
CO2: 22 mmol/L (ref 19–32)
Creatinine, Ser: 0.98 mg/dL (ref 0.50–1.10)
GFR calc Af Amer: 65 mL/min — ABNORMAL LOW (ref >90)
GFR calc non Af Amer: 56 mL/min — ABNORMAL LOW (ref >90)
GLUCOSE: 129 mg/dL — AB (ref 70–99)
Potassium: 2.8 mmol/L — ABNORMAL LOW (ref 3.5–5.1)
SODIUM: 137 mmol/L (ref 135–145)

## 2014-06-20 LAB — GLUCOSE, CAPILLARY
GLUCOSE-CAPILLARY: 128 mg/dL — AB (ref 70–99)
GLUCOSE-CAPILLARY: 151 mg/dL — AB (ref 70–99)
Glucose-Capillary: 123 mg/dL — ABNORMAL HIGH (ref 70–99)
Glucose-Capillary: 128 mg/dL — ABNORMAL HIGH (ref 70–99)
Glucose-Capillary: 117 mg/dL — ABNORMAL HIGH (ref 70–99)
Glucose-Capillary: 137 mg/dL — ABNORMAL HIGH (ref 70–99)

## 2014-06-20 LAB — PROTEIN, CSF: Total  Protein, CSF: 85 mg/dL — ABNORMAL HIGH (ref 15–45)

## 2014-06-20 LAB — GLUCOSE, CSF: Glucose, CSF: 85 mg/dL — ABNORMAL HIGH (ref 43–76)

## 2014-06-20 LAB — MRSA PCR SCREENING: MRSA BY PCR: NEGATIVE

## 2014-06-20 MED ORDER — POTASSIUM CHLORIDE IN NACL 40-0.9 MEQ/L-% IV SOLN
INTRAVENOUS | Status: DC
  Administered 2014-06-20: 75 mL/h via INTRAVENOUS
  Filled 2014-06-20 (×4): qty 1000

## 2014-06-20 MED ORDER — SODIUM CHLORIDE 0.9 % IV SOLN
2.0000 g | Freq: Four times a day (QID) | INTRAVENOUS | Status: DC
  Administered 2014-06-20 – 2014-06-21 (×4): 2 g via INTRAVENOUS
  Filled 2014-06-20 (×6): qty 2000

## 2014-06-20 MED ORDER — VANCOMYCIN HCL IN DEXTROSE 750-5 MG/150ML-% IV SOLN
750.0000 mg | Freq: Two times a day (BID) | INTRAVENOUS | Status: DC
  Administered 2014-06-20 – 2014-06-22 (×4): 750 mg via INTRAVENOUS
  Filled 2014-06-20 (×5): qty 150

## 2014-06-20 MED ORDER — POTASSIUM CHLORIDE 10 MEQ/100ML IV SOLN
10.0000 meq | INTRAVENOUS | Status: AC
  Administered 2014-06-20 (×3): 10 meq via INTRAVENOUS
  Filled 2014-06-20 (×3): qty 100

## 2014-06-20 MED ORDER — HYDRALAZINE HCL 20 MG/ML IJ SOLN
5.0000 mg | Freq: Four times a day (QID) | INTRAMUSCULAR | Status: DC
  Administered 2014-06-20 – 2014-06-27 (×25): 5 mg via INTRAVENOUS
  Filled 2014-06-20 (×27): qty 1

## 2014-06-20 MED ORDER — SODIUM CHLORIDE 0.9 % IJ SOLN
10.0000 mL | INTRAMUSCULAR | Status: DC | PRN
  Administered 2014-06-27: 10 mL
  Filled 2014-06-20: qty 40

## 2014-06-20 MED ORDER — POTASSIUM CHLORIDE 10 MEQ/100ML IV SOLN
10.0000 meq | INTRAVENOUS | Status: DC
  Administered 2014-06-20: 10 meq via INTRAVENOUS
  Filled 2014-06-20: qty 100

## 2014-06-20 MED ORDER — STROKE: EARLY STAGES OF RECOVERY BOOK
Freq: Once | Status: AC
  Administered 2014-06-20: 13:00:00
  Filled 2014-06-20: qty 1

## 2014-06-20 NOTE — Progress Notes (Signed)
Subjective: patient remains sedated, responds to pain and localizes with left hand. Per family she has remained sedated since receiving Ativan last night.   Objective: Current vital signs: BP 150/67 mmHg  Pulse 77  Temp(Src) 100.4 F (38 C) (Rectal)  Resp 24  Ht 5' 6"  (1.676 m)  Wt 74.617 kg (164 lb 8 oz)  BMI 26.56 kg/m2  SpO2 97% Vital signs in last 24 hours: Temp:  [100.4 F (38 C)-102.5 F (39.2 C)] 100.4 F (38 C) (03/25 0500) Pulse Rate:  [77-104] 77 (03/25 0800) Resp:  [16-31] 24 (03/25 0800) BP: (144-189)/(46-97) 150/67 mmHg (03/25 0800) SpO2:  [93 %-99 %] 97 % (03/25 0800) Weight:  [74.617 kg (164 lb 8 oz)-74.844 kg (165 lb)] 74.617 kg (164 lb 8 oz) (03/25 0500)  Intake/Output from previous day: 03/24 0701 - 03/25 0700 In: 2500 [I.V.:2500] Out: -  Intake/Output this shift:   Nutritional status:    Neurologic Exam: General: Mental Status: Sedated, responds to pain by with drawing legs and localizes with left arm.  Grimaces and grunts to pain. No verbal out put.  Cranial Nerves: II: No blink to threat bilaterally, pupils equal, round, reactive to light and accommodation III,IV, VI: holds eyes closed. When eyes opened she has a left gaze deviation.  V,VII: face symmetric, winces to pain bilaterally --positive neck rigidity and pain with motion.   Motor: No movement in right arm noted.  All other extremities move antigravity to noxious stimuli.  Sensory: Pinprick and light touch intact throughout, bilaterally Deep Tendon Reflexes:  Right: Upper Extremity   Left: Upper extremity   biceps (C-5 to C-6) 2/4   biceps (C-5 to C-6) 2/4 tricep (C7) 2/4    triceps (C7) 2/4 Brachioradialis (C6) 2/4  Brachioradialis (C6) 2/4  Lower Extremity Lower Extremity  quadriceps (L-2 to L-4) 2/4   quadriceps (L-2 to L-4) 2/4 Achilles (S1) 0/4   Achilles (S1) 0/4  Plantars: Right: downgoing   Left: downgoing Cerebellar:     Lab Results: Basic Metabolic Panel:  Recent  Labs Lab 06/14/14 0521 06/19/14 1234 06/20/14 0520  NA 141 139 137  K 3.6 3.9 2.8*  CL 112 102 107  CO2 20 25 22   GLUCOSE 128* 201* 129*  BUN 8 8 6   CREATININE 1.07 1.21* 0.98  CALCIUM 10.0 10.3 9.1    Liver Function Tests:  Recent Labs Lab 06/19/14 1234  AST 32  ALT 18  ALKPHOS 65  BILITOT 0.5  PROT 7.0  ALBUMIN 3.8   No results for input(s): LIPASE, AMYLASE in the last 168 hours. No results for input(s): AMMONIA in the last 168 hours.  CBC:  Recent Labs Lab 06/14/14 0521 06/19/14 1234 06/20/14 0520  WBC 8.6 16.1* 15.2*  NEUTROABS  --  13.5*  --   HGB 10.4* 12.5 11.2*  HCT 30.6* 35.8* 31.9*  MCV 90.0 88.0 86.7  PLT 160 204 180    Cardiac Enzymes:  Recent Labs Lab 06/19/14 2023  CKTOTAL 624*    Lipid Panel: No results for input(s): CHOL, TRIG, HDL, CHOLHDL, VLDL, LDLCALC in the last 168 hours.  CBG:  Recent Labs Lab 06/16/14 0752 06/19/14 2250 06/20/14 0007 06/20/14 0436 06/20/14 0806  GLUCAP 134* 147* 151* 123* 128*    Microbiology: Results for orders placed or performed during the hospital encounter of 06/19/14  Blood Culture (routine x 2)     Status: None (Preliminary result)   Collection Time: 06/19/14 12:23 PM  Result Value Ref Range Status   Specimen  Description BLOOD LEFT ANTECUBITAL  Final   Special Requests BOTTLES DRAWN AEROBIC AND ANAEROBIC 3CC  Final   Culture   Final           BLOOD CULTURE RECEIVED NO GROWTH TO DATE CULTURE WILL BE HELD FOR 5 DAYS BEFORE ISSUING A FINAL NEGATIVE REPORT Performed at [facility]    Report Status PENDING  Incomplete  Blood Culture (routine x 2)     Status: None (Preliminary result)   Collection Time: 06/19/14 12:36 PM  Result Value Ref Range Status   Specimen Description BLOOD LEFT ANTECUBITAL  Final   Special Requests BOTTLES DRAWN AEROBIC AND ANAEROBIC 5ML  Final   Culture   Final           BLOOD CULTURE RECEIVED NO GROWTH TO DATE CULTURE WILL BE HELD FOR 5 DAYS BEFORE ISSUING  A FINAL NEGATIVE REPORT Performed at [facility]    Report Status PENDING  Incomplete  MRSA PCR Screening     Status: None   Collection Time: 06/19/14  8:30 PM  Result Value Ref Range Status   MRSA by PCR NEGATIVE NEGATIVE Final    Comment:        The GeneXpert MRSA Assay (FDA approved for NASAL specimens only), is one component of a comprehensive MRSA colonization surveillance program. It is not intended to diagnose MRSA infection nor to guide or monitor treatment for MRSA infections.     Coagulation Studies:  Recent Labs  06/19/14 2023  LABPROT 14.2  INR 1.09    Imaging: Ct Head Wo Contrast  06/19/2014   CLINICAL DATA:  Altered mental status, nonverbal with fixed right gaze.  EXAM: CT HEAD WITHOUT CONTRAST  TECHNIQUE: Contiguous axial images were obtained from the base of the skull through the vertex without intravenous contrast.  COMPARISON:  None.  FINDINGS: Skull and Sinuses:No fracture or aggressive lesion. Fluid level in the right maxillary sinus and mild scattered inflammatory mucosal thickening is likely incidental given the history.  Orbits: Rightward deviation of gaze.  Brain: No evidence of acute infarction, hemorrhage, hydrocephalus, or mass lesion/mass effect. The insular ribbon on the left is somewhat indistinct inferiorly, but symmetric. Mild periventricular and subcortical left occipital low-density is most likely chronic small vessel ischemia.  IMPRESSION: 1. No acute intracranial findings. 2. Mild chronic small vessel ischemia.   Electronically Signed   By: Monte Fantasia M.D.   On: 06/19/2014 14:50   Ct Cervical Spine Wo Contrast  06/19/2014   CLINICAL DATA:  Found on floor, with right-sided gaze. Concern for cervical spine injury. Initial encounter.  EXAM: CT CERVICAL SPINE WITHOUT CONTRAST  TECHNIQUE: Multidetector CT imaging of the cervical spine was performed without intravenous contrast. Multiplanar CT image reconstructions were also generated.   COMPARISON:  None.  FINDINGS: There is no evidence of fracture or subluxation. Vertebral bodies demonstrate normal height and alignment. Multilevel disc space narrowing is noted along the lower cervical spine, with anterior and posterior disc osteophyte complexes. Prevertebral soft tissues are within normal limits.  The thyroid gland is unremarkable in appearance. The visualized lung apices are clear. Calcification is noted at the carotid bifurcations, more prominent on the right. Mucosal thickening is noted at the left side of the sphenoid sinus.  IMPRESSION: 1. No evidence of fracture or subluxation along the cervical spine. 2. Mild degenerative change along the lower cervical spine. 3. Calcification at the carotid bifurcations, more prominent on the right. Carotid ultrasound would be helpful for further evaluation, when and  as deemed clinically appropriate. 4. Mucosal thickening at the left side of the sphenoid sinus.   Electronically Signed   By: Garald Balding M.D.   On: 06/19/2014 17:29   Mr Brain Wo Contrast  06/20/2014   CLINICAL DATA:  Altered mental status. Found on the floor unresponsive yesterday.  EXAM: MRI HEAD WITHOUT CONTRAST  TECHNIQUE: Multiplanar, multiecho pulse sequences of the brain and surrounding structures were obtained without intravenous contrast.  COMPARISON:  CT HEAD WITHOUT CONTRAST 06/19/2014  FINDINGS: The diffusion-weighted images demonstrate an acute/subacute nonhemorrhagic infarct involving the posterior medial left thalamus. The infarct measures 10 mm maximally. Subtle T2 changes are associated with the infarct.  A remote left occipital lobe scratch side a remote medial left occipital lobe infarct is noted. Minimal scratch the mild periventricular white matter changes are present bilaterally. Minimal subcortical white matter change otherwise is predominantly in the anterior frontal lobes.  The ventricles are of normal size. Flow is present in the major intracranial arteries. An  arachnoid cyst of the right middle cranial fossa measures 9 x 21 mm. No other significant extra-axial fluid collections are present.  The globes and orbits are intact. Chronic right maxillary sinus disease is present. There is a polyp or mucous retention cyst within the left sphenoid sinus. The paranasal sinuses and mastoid air cells are otherwise clear. The skullbase is unremarkable. The intracranial midline structures are within normal limits. 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  IMPRESSION: 1. 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 2. Remote medial left occipital lobe infarct. 3. Atrophy and minimal white matter changes otherwise within normal limits for age. 4. Chronic right maxillary sinus disease.   Electronically Signed   By: San Morelle M.D.   On: 06/20/2014 07:35   Dg Chest Port 1 View  06/19/2014   CLINICAL DATA:  74 year old female with altered mental status, found down this morning. Recent hospitalization for gastroenteritis. Initial encounter.  EXAM: PORTABLE CHEST - 1 VIEW  COMPARISON:  CT Abdomen and Pelvis 05/16/2014, and earlier.  FINDINGS: Portable AP semi upright view at 1241 hours. Lower lung volumes. Normal cardiac size and mediastinal contours. Allowing for portable technique, the lungs are clear. No pneumothorax or pleural effusion identified.  IMPRESSION: No acute cardiopulmonary abnormality.   Electronically Signed   By: Genevie Ann M.D.   On: 06/19/2014 13:25    Medications:  Prior to Admission:  Prescriptions prior to admission  Medication Sig Dispense Refill Last Dose  . Cholecalciferol (VITAMIN D) 2000 UNITS CAPS Take 1 capsule by mouth daily.   06/18/2014 at Unknown time  . cloNIDine (CATAPRES) 0.1 MG tablet Take 1 tablet (0.1 mg total) by mouth 2 (two) times daily. 60 tablet 11 unk at Unknown time  . folic acid (FOLVITE) 1 MG tablet Take 1 mg by mouth daily.   06/18/2014 at Unknown time  . gabapentin (NEURONTIN) 600  MG tablet Take 600 mg by mouth 3 (three) times daily.    06/18/2014 at Unknown time  . hydrALAZINE (APRESOLINE) 50 MG tablet Take 1 tablet (50 mg total) by mouth every 8 (eight) hours. 90 tablet 0 06/18/2014 at Unknown time  . HYDROcodone-acetaminophen (NORCO) 10-325 MG per tablet Take 1 tablet by mouth every 6 (six) hours as needed (for pain.).    06/18/2014 at Unknown time  . lisinopril (PRINIVIL,ZESTRIL) 10 MG tablet Take 1 tablet (10 mg total) by mouth daily.   06/18/2014 at Unknown time  . omeprazole (PRILOSEC) 20 MG capsule  Take 20 mg by mouth every morning.    06/18/2014 at Unknown time  . ondansetron (ZOFRAN) 4 MG tablet Take 1 tablet (4 mg total) by mouth every 6 (six) hours as needed for nausea. 20 tablet 0 06/18/2014 at Unknown time  . potassium chloride SA (K-DUR,KLOR-CON) 20 MEQ tablet Take 20 mEq by mouth daily.   06/18/2014 at Unknown time  . PROAIR HFA 108 (90 BASE) MCG/ACT inhaler Inhale 1 puff into the lungs every 6 (six) hours as needed. Shortness of breath or wheezing  3 unk at Unknown time  . simvastatin (ZOCOR) 40 MG tablet Take 40 mg by mouth at bedtime.     06/18/2014 at Unknown time  . sitaGLIPtin (JANUVIA) 100 MG tablet Take 50 mg by mouth every morning.    06/18/2014 at Unknown time  . spironolactone (ALDACTONE) 25 MG tablet Take 25 mg by mouth every morning.    06/18/2014 at Unknown time  . sulfaSALAzine (AZULFIDINE) 500 MG tablet Take 500 mg by mouth 4 (four) times daily.   06/18/2014 at Unknown time  . traZODone (DESYREL) 100 MG tablet Take 200 mg by mouth at bedtime.   0 unk at Unknown time   Scheduled: . acyclovir  600 mg Intravenous Q12H  . ampicillin (OMNIPEN) IV  2 g Intravenous 4 times per day  . cefTRIAXone (ROCEPHIN)  IV  2 g Intravenous Q12H  . famotidine (PEPCID) IV  20 mg Intravenous Q24H  . heparin  5,000 Units Subcutaneous 3 times per day  . insulin aspart  0-15 Units Subcutaneous 6 times per day  . levETIRAcetam  500 mg Intravenous Q12H  . nicotine  21 mg  Transdermal Daily  . potassium chloride  10 mEq Intravenous Q1 Hr x 3  . sodium chloride  3 mL Intravenous Q12H  . vancomycin  1,250 mg Intravenous Q24H    Assessment/Plan: 74 yo F with febrile AMS and neck stiffness. Concern for meningitis or encephalitis. No seizures over night.  Has remained sedated since receiving Ativan at 2252.  Currently on Keppra 500 mg BID. EEG done with result pending.  Patient to receive LP under fluoroscopy. MRI brain revealed a acute/subacute infarct in left thalamus.    Recommend: 1) Stroke work up 2) Continue ABX and Acyclovir 3) EEG pending  Stroke team to follow.   Kalley Nicholl PA-C [facility] [phone]  I personally participated in this patient's evaluation and management, including formulating above clinical impression and management recommendations.  Rush Farmer M.D. [facility] [phone]  06/20/2014, 10:22 AM

## 2014-06-20 NOTE — Progress Notes (Signed)
[facility]  Patient Status: Active (receiving services up to time of hospitalization)  [facility] is providing the following services: PT  If patient discharges after hours, please call [phone].   Consepcion Hearing 06/20/2014, 4:30 PM

## 2014-06-20 NOTE — Progress Notes (Signed)
  Echocardiogram 2D Echocardiogram has been performed.  Michelle Farrell 06/20/2014, 3:12 PM

## 2014-06-20 NOTE — Progress Notes (Signed)
Peripherally Inserted Central Catheter/Midline Placement  The IV Nurse has discussed with the patient and/or persons authorized to consent for the patient, the purpose of this procedure and the potential benefits and risks involved with this procedure.  The benefits include less needle sticks, lab draws from the catheter and patient may be discharged home with the catheter.  Risks include, but not limited to, infection, bleeding, blood clot (thrombus formation), and puncture of an artery; nerve damage and irregular heat beat.  Alternatives to this procedure were also discussed.  Consent obtained by [name], RN from granddaughter who is the POA.  PICC/Midline Placement Documentation        Michelle Farrell, Nicolette Bang 06/20/2014, 3:54 PM

## 2014-06-20 NOTE — Procedures (Signed)
ELECTROENCEPHALOGRAM REPORT  Patient: Michelle Farrell       Room #: 0F00 EEG No. ID: [id number] Age: 74 y.o.        Sex: female Referring [id number], I  Report Date:  06/20/2014        Interpreting Physician: Wallie Char R  History: Michelle Farrell is an 74 y.o. female with multiple recent admissions for nausea and vomiting and diarrhea, admitted on 06/19/2014 for encephalopathy with high-grade fever and urosepsis. Patient was noted to have twitching-like movements, concerning for possible seizure activity.  Indications for study:  Assess severity of encephalopathy; rule out seizure activity.  Technique: This is an 18 channel routine scalp EEG performed at the bedside with bipolar and monopolar montages arranged in accordance to the international 10/20 system of electrode placement.   Description: Patient was noted to be obtunded during the EG recording and minimally responsive noxious stimuli. Predominant background activity consisted of low to moderate amplitude mixed irregular delta and theta activity as well as moderately high amplitude, essentially continuous sharp wave discharges recorded from the left hemisphere primarily, but occasionally were diffuse. Photic stimulation and hyperventilation were not performed. Sleep spindles were frequently recorded bilaterally but more pronounced on the right side than the left. No frank epileptiform discharges were recorded.  Interpretation: This EEG is abnormal with moderately severe generalized nonspecific slowing of cerebral activity in addition to periodic sharp wave discharges recorded primarily from the left hemisphere, which would not to be epileptic in nature, and most often seen following acute strokes. No clear frank epileptiform discharges were recorded.   Rush Farmer M.D. [facility] [phone]

## 2014-06-20 NOTE — Progress Notes (Deleted)
UR Completed Yisell Sprunger Graves-Bigelow, RN,BSN [phone]  

## 2014-06-20 NOTE — Progress Notes (Signed)
CRITICAL VALUE ALERT  Critical value received:  Lactic Acid 2.2   Date of notification:  06/19/14  Time of notification:  2025  Critical value read back:Yes.    Nurse who received alert:  Lysle Rubens, RN    Responding MD:  Leonel Ramsay, MD  Time MD responded:  2030.  Critical lab was reported in person since MD was on floor.

## 2014-06-20 NOTE — Progress Notes (Signed)
ANTIBIOTIC CONSULT NOTE - FOLLOW UP  Pharmacy Consult for vancomycin Indication: r/o meningitis  No Known Allergies  Patient Measurements: Height: 5' 6"  (167.6 cm) Weight: 164 lb 8 oz (74.617 kg) IBW/kg (Calculated) : 59.3  Vital Signs: Temp: 100.4 F (38 C) (03/25 0500) BP: 150/67 mmHg (03/25 0800) Pulse Rate: 77 (03/25 0800) Intake/Output from previous day: 03/24 0701 - 03/25 0700 In: 2500 [I.V.:2500] Out: -  Intake/Output from this shift:    Labs:  Recent Labs  06/19/14 1234 06/20/14 0520  WBC 16.1* 15.2*  HGB 12.5 11.2*  PLT 204 180  CREATININE 1.21* 0.98   Estimated Creatinine Clearance: 53.6 mL/min (by C-G formula based on Cr of 0.98). No results for input(s): VANCOTROUGH, VANCOPEAK, VANCORANDOM, GENTTROUGH, GENTPEAK, GENTRANDOM, TOBRATROUGH, TOBRAPEAK, TOBRARND, AMIKACINPEAK, AMIKACINTROU, AMIKACIN in the last 72 hours.   Microbiology: Recent Results (from the past 720 hour(s))  Blood Culture (routine x 2)     Status: None (Preliminary result)   Collection Time: 06/19/14 12:23 PM  Result Value Ref Range Status   Specimen Description BLOOD LEFT ANTECUBITAL  Final   Special Requests BOTTLES DRAWN AEROBIC AND ANAEROBIC 3CC  Final   Culture   Final           BLOOD CULTURE RECEIVED NO GROWTH TO DATE CULTURE WILL BE HELD FOR 5 DAYS BEFORE ISSUING A FINAL NEGATIVE REPORT Performed at [facility]    Report Status PENDING  Incomplete  Blood Culture (routine x 2)     Status: None (Preliminary result)   Collection Time: 06/19/14 12:36 PM  Result Value Ref Range Status   Specimen Description BLOOD LEFT ANTECUBITAL  Final   Special Requests BOTTLES DRAWN AEROBIC AND ANAEROBIC 5ML  Final   Culture   Final           BLOOD CULTURE RECEIVED NO GROWTH TO DATE CULTURE WILL BE HELD FOR 5 DAYS BEFORE ISSUING A FINAL NEGATIVE REPORT Performed at [facility]    Report Status PENDING  Incomplete  MRSA PCR Screening     Status: None   Collection Time:  06/19/14  8:30 PM  Result Value Ref Range Status   MRSA by PCR NEGATIVE NEGATIVE Final    Comment:        The GeneXpert MRSA Assay (FDA approved for NASAL specimens only), is one component of a comprehensive MRSA colonization surveillance program. It is not intended to diagnose MRSA infection nor to guide or monitor treatment for MRSA infections.     Anti-infectives    Start     Dose/Rate Route Frequency Ordered Stop   06/20/14 1500  vancomycin (VANCOCIN) 1,250 mg in sodium chloride 0.9 % 250 mL IVPB     1,250 mg 166.7 mL/hr over 90 Minutes Intravenous Every 24 hours 06/19/14 1349     06/20/14 0200  acyclovir (ZOVIRAX) 600 mg in dextrose 5 % 100 mL IVPB     600 mg 112 mL/hr over 60 Minutes Intravenous Every 12 hours 06/19/14 2313     06/20/14 0000  ampicillin (OMNIPEN) 2 g in sodium chloride 0.9 % 50 mL IVPB     2 g 150 mL/hr over 20 Minutes Intravenous 4 times per day 06/19/14 2313     06/19/14 2315  cefTRIAXone (ROCEPHIN) 2 g in dextrose 5 % 50 mL IVPB - Premix     2 g 100 mL/hr over 30 Minutes Intravenous Every 12 hours 06/19/14 2313     06/19/14 1800  piperacillin-tazobactam (ZOSYN) IVPB 3.375 g  Status:  Discontinued     3.375 g 12.5 mL/hr over 240 Minutes Intravenous Every 8 hours 06/19/14 1350 06/19/14 2243   06/19/14 1400  vancomycin (VANCOCIN) 500 mg in sodium chloride 0.9 % 100 mL IVPB     500 mg 100 mL/hr over 60 Minutes Intravenous  Once 06/19/14 1349 06/19/14 1548   06/19/14 1215  piperacillin-tazobactam (ZOSYN) IVPB 3.375 g     3.375 g 100 mL/hr over 30 Minutes Intravenous  Once 06/19/14 1201 06/19/14 1329   06/19/14 1215  vancomycin (VANCOCIN) IVPB 1000 mg/200 mL premix  Status:  Discontinued     1,000 mg 200 mL/hr over 60 Minutes Intravenous  Once 06/19/14 1201 06/19/14 1349      Assessment: 74 yo female with AMS and neckstiffness and concern for  meningitis or encephalitis. She is on rocephin/ampicillin/vancomycin and acyclovir. WBC= 15.2, tmax= 102.5,  SCr= 0.98 (down from 1.21 on 3/24) and CrCL ~ 50.  Vanc 3/24 >>3/24 Zosyn 3/24 >> 3/24 Ampicillin 3/24>> Rocephin 3/24>> Acyclovir 3/24>>  3/24 blood x2 3/24 urine   Goal of Therapy:  Vancomycin trough level 15-20 mcg/ml  Plan:  -Will change vancomycin to 750m IV q12h -Continue ampicillin 2gm IV q6h, rocephin 2gm IV q12h and acyclovir 6071mIV q12h -Will follow renal function, cultures and clinical progress  AnHildred LaserPharm D 06/20/2014 10:48 AM   e

## 2014-06-20 NOTE — Progress Notes (Signed)
SLP Cancellation Note  Patient Details Name: Michelle Farrell MRN: [id number] DOB: 08/14/1940   Cancelled treatment:       Reason Eval/Treat Not Completed: Patient not medically ready   ADAMS,PAT, M.S., CCC-SLP 06/20/2014, 12:28 PM

## 2014-06-20 NOTE — Progress Notes (Signed)
Patient ID: Michelle Farrell, female   DOB: 01-25-1941, 74 y.o.   MRN: [id number]  [facility] PROGRESS NOTE  Michelle Farrell YQM:[id number] DOB: [birth year] DOA: 06/19/2014 PCP: Hermelinda Medicus, MD   Brief narrative:    74 y.o. female, with PMH of heart failure (unspecified), diabetes mellitus, chronic hip, back, and leg pain, hypertension, and well-controlled Crohn's disease, 2 recent hospitalizations in the last 4.5 weeks. She was discharged on February 21 after suffering with nausea, vomiting, diarrhea, and acute renal failure and was discharged again on March 21 after having same symptoms. On admission, pt was lethargic and unable to provide any history. Granddaughter provided history and reports malodorous urine and low grade fevers, also reports that pt has been complaining of abd pain. Granddaughter also reported poor oral intake and mostly non productive cough. She found her on the floor when she went to check up on her at 10 am morning of admission.   In the ER the patient was minimally responsive and blood work was notable for WBC 16 K, T 102.5 F, RR in 30's, lactic acid 2.59. UA was suggestive of UTI. TRH asked to admit to SDU.  Assessment/Plan:    Principal Problem:   Severe Sepsis - criteria for sepsis met on admission with T 102.5 F, WBC 16K, elevated lactic acid, RR in 30's - source still unclear but appears to be related to UTI based on UA results, still ruling out acute viral/bact meningitis  - will continue supportive care with IVF - pt started on broad spectrum ABX Ampicillin, Rocephin, Vancomycin for now to cover for possible bacterial meningitis  - also continuing acyclovir until LP results are back  - will need to follow up on blood and urine culture - LP done today and will need to follow up on results  - will narrow ABX once more date results available  Active Problems:   Acute encephalopathy - unclear etiology and likely multifactorial and secondary to acute  UTI, rulling out acute viral/bact meningitis, MRI with acute stroke - pt still fairly somnolent, only opens eyes with sternal rub - appreciate neurology team following, EEG to be done today, LP also done today and will need to follow up on results - continue ABX as noted above, should be adequate in coverage for presumptive UTI   Acute stroke in posterior medial thalamus - stroke order set in place - keep pt NPO for now and once more alert proceed with SLP/PT/OT - 2 D ECHO, carotid dopplers pending - appreciate neurology team following  - lipid panel and A1C pending    Hypokalemia - supplement with IVF for now - check Mg level and repeat electrolyte panel in AM   UTI - current ABX should be adequate in coverage - follow upon urine cultures    HTN, accelerated - place on Hydralazine scheduled and as needed and readjust the regimen as indicated    Crohn disease - appears to be stable at this time    Pelvic mass   Back pain, chronic - hold off on sedating medications until pt more alert    DM (diabetes mellitus) - place on SSI for now  - check A1C   Tobacco abuse - will consult on cessation once pt more medically stable and able to participate in conversation  - provide Nicotine patch    Acute renal failure - secondary to pre renal etiology in the setting of sepsis - IVF provided and Cr is now WNL - repeat BMP in AM  Severe PCM - in the context of acute illness - will need to keep NPO for now until pt more alert - will need SLP   DVT prophylaxis - Heparin SQ  Code Status: Full.  Family Communication:  plan of care discussed with family at bedside  Disposition Plan: Requires hospitalization due to complexity of medical issues   IV access:  Peripheral IV  Procedures and diagnostic studies:    Ct Head Wo Contrast  07-17-2014   No acute intracranial findings. 2. Mild chronic small vessel ischemia.     Ct Cervical Spine Wo Contrast  07/17/2014    No evidence of fracture or  subluxation along the cervical spine. 2. Mild degenerative change along the lower cervical spine. 3. Calcification at the carotid bifurcations, more prominent on the right. Carotid ultrasound would be helpful for further evaluation, when and as deemed clinically appropriate. 4. Mucosal thickening at the left side of the sphenoid sinus.    Mr Brain Wo Contrast  06/20/2014   Acute/subacute 10 mm nonhemorrhagic infarct within the posterior medial left thalamus. 2. Remote medial left occipital lobe infarct. 3. Atrophy and minimal white matter changes otherwise within normal limits for age. 4. Chronic right maxillary sinus disease.     Dg Chest Port 1 View  07/17/2014 : No acute cardiopulmonary abnormality.    Dg Fluoro Guide Lumbar Puncture  06/20/2014  Successful lumbar puncture with fluoroscopy.     Medical Consultants:  Neurology IR for LP  Other Consultants:  None   IAnti-Infectives:   Vancomycin 07/17/22 --> Rocephin July 17, 2022 --> Ampicillin 07/17/22 -->   Faye Ramsay, MD  [facility] Pager [phone]  If 7PM-7AM, please contact night-coverage [email or web address] Password [email or web address] 06/20/2014, 5:00 PM   LOS: 1 day   HPI/Subjective: No events overnight.   Objective: Filed Vitals:   06/20/14 0800 06/20/14 0830 06/20/14 1205 06/20/14 1515  BP: 150/67 142/64 173/58   Pulse: 77 80 77   Temp:    98.9 F (37.2 C)  TempSrc:      Resp: 24 26 25    Height:      Weight:      SpO2: 97% 98% 98%    No intake or output data in the 24 hours ending 06/20/14 1700  Exam:   General:  Pt is lethargic, opens eyes with sternal rub only   Cardiovascular: Regular rate and rhythm,  no rubs, no gallops  Respiratory: Clear to auscultation bilaterally, diminished breath sounds at bases   Abdomen: Soft, non tender, non distended, bowel sounds present, no guarding  Extremities: pulses DP and PT palpable bilaterally   Data Reviewed: Basic Metabolic Panel:  Recent Labs Lab 06/14/14 0521 07-17-2014 1234  06/20/14 0520  NA 141 139 137  K 3.6 3.9 2.8*  CL 112 102 107  CO2 20 25 22   GLUCOSE 128* 201* 129*  BUN 8 8 6   CREATININE 1.07 1.21* 0.98  CALCIUM 10.0 10.3 9.1   Liver Function Tests:  Recent Labs Lab 07/17/14 1234  AST 32  ALT 18  ALKPHOS 65  BILITOT 0.5  PROT 7.0  ALBUMIN 3.8   CBC:  Recent Labs Lab 06/14/14 0521 2014/07/17 1234 06/20/14 0520  WBC 8.6 16.1* 15.2*  NEUTROABS  --  13.5*  --   HGB 10.4* 12.5 11.2*  HCT 30.6* 35.8* 31.9*  MCV 90.0 88.0 86.7  PLT 160 204 180   Cardiac Enzymes:  Recent Labs Lab 07/17/2014 2023  CKTOTAL 624*   CBG:  Recent Labs Lab 07/17/14 2250 06/20/14  0007 06/20/14 0436 06/20/14 0806 06/20/14 1224  GLUCAP 147* 151* 123* 128* 117*    Recent Results (from the past 240 hour(s))  Blood Culture (routine x 2)     Status: None (Preliminary result)   Collection Time: 06/19/14 12:23 PM  Result Value Ref Range Status   Specimen Description BLOOD LEFT ANTECUBITAL  Final   Special Requests BOTTLES DRAWN AEROBIC AND ANAEROBIC 3CC  Final   Culture   Final           BLOOD CULTURE RECEIVED NO GROWTH TO DATE CULTURE WILL BE HELD FOR 5 DAYS BEFORE ISSUING A FINAL NEGATIVE REPORT Performed at [facility]    Report Status PENDING  Incomplete  Blood Culture (routine x 2)     Status: None (Preliminary result)   Collection Time: 06/19/14 12:36 PM  Result Value Ref Range Status   Specimen Description BLOOD LEFT ANTECUBITAL  Final   Special Requests BOTTLES DRAWN AEROBIC AND ANAEROBIC 5ML  Final   Culture   Final           BLOOD CULTURE RECEIVED NO GROWTH TO DATE CULTURE WILL BE HELD FOR 5 DAYS BEFORE ISSUING A FINAL NEGATIVE REPORT Performed at [facility]    Report Status PENDING  Incomplete  MRSA PCR Screening     Status: None   Collection Time: 06/19/14  8:30 PM  Result Value Ref Range Status   MRSA by PCR NEGATIVE NEGATIVE Final    Comment:        The GeneXpert MRSA Assay (FDA approved for NASAL  specimens only), is one component of a comprehensive MRSA colonization surveillance program. It is not intended to diagnose MRSA infection nor to guide or monitor treatment for MRSA infections.   Gram stain     Status: None   Collection Time: 06/20/14 11:33 AM  Result Value Ref Range Status   Specimen Description CSF  Final   Special Requests NONE  Final   Gram Stain   Final    CYTOSPIN PREP WBC PRESENT, PREDOMINANTLY PMN NO ORGANISMS SEEN    Report Status 06/20/2014 FINAL  Final     Scheduled Meds: . acyclovir  600 mg Intravenous Q12H  . ampicillin (OMNIPEN) IV  2 g Intravenous Q6H  . cefTRIAXone (ROCEPHIN)  IV  2 g Intravenous Q12H  . famotidine (PEPCID) IV  20 mg Intravenous Q24H  . heparin  5,000 Units Subcutaneous 3 times per day  . insulin aspart  0-15 Units Subcutaneous 6 times per day  . levETIRAcetam  500 mg Intravenous Q12H  . nicotine  21 mg Transdermal Daily  . sodium chloride  3 mL Intravenous Q12H  . vancomycin  750 mg Intravenous Q12H   Continuous Infusions:

## 2014-06-20 NOTE — Care Management Note (Signed)
    Page 1 of 1   06/23/2014     2:56:25 PM CARE MANAGEMENT NOTE 06/23/2014  Patient:  Michelle Farrell, Michelle Farrell   Account Number:  [id number]  Date Initiated:  06/20/2014  Documentation initiated by:  GRAVES-BIGELOW,BRENDA  Subjective/Objective Assessment:   Pt admitted for AMS syncope-s/p fall and Positive UTI. Initiated on IV ABX therapy. Pt is form home.     Action/Plan:   CM to monitor for disposition needs.   Anticipated DC Date:  06/25/2014   Anticipated DC Plan:  IP REHAB FACILITY  In-house referral  Clinical Social Worker      DC Planning Services  CM consult      Choice offered to / List presented to:             Status of service:  In process, will continue to follow Medicare Important Message given?  YES (If response is "NO", the following Medicare IM given date fields will be blank) Date Medicare IM given:  06/20/2014 Medicare IM given by:  GRAVES-BIGELOW,BRENDA Date Additional Medicare IM given:   Additional Medicare IM given by:    Discharge Disposition:    Per UR Regulation:  Reviewed for med. necessity/level of care/duration of stay  If discussed at [place] of Stay Meetings, dates discussed:   06/24/2014    Comments:  06/23/14- 1330- Michelle Gibbons RN, BSN [phone] Stopped by room to speak with granddaughter- but she had just left- call made to granddaughter Michelle Farrell [phone]) and spoke over phone regarding d/c options CIR vs STSNF for rehab- explained CIR consult was pending and how we would f/u on the recommendations to see if pt was a good fit for CIR vs needing STSNF for rehab- will have CSW also f/u with granddaughter to discuss STSNF as a backup plan to CIR- (notified Michelle Farrell. - CSW to call granddaughter) NCM to f/u once CIR has seen pt and assess CIR vs STSNF.

## 2014-06-20 NOTE — Progress Notes (Signed)
EEG Completed; Results Pending  

## 2014-06-20 NOTE — Progress Notes (Signed)
UR Completed Jazel Nimmons Graves-Bigelow, RN,BSN [phone]  

## 2014-06-21 ENCOUNTER — Inpatient Hospital Stay (HOSPITAL_COMMUNITY): Payer: Medicare Other

## 2014-06-21 DIAGNOSIS — I6359 Cerebral infarction due to unspecified occlusion or stenosis of other cerebral artery: Secondary | ICD-10-CM

## 2014-06-21 DIAGNOSIS — E118 Type 2 diabetes mellitus with unspecified complications: Secondary | ICD-10-CM

## 2014-06-21 DIAGNOSIS — A419 Sepsis, unspecified organism: Secondary | ICD-10-CM

## 2014-06-21 LAB — BASIC METABOLIC PANEL
ANION GAP: 6 (ref 5–15)
CALCIUM: 9.3 mg/dL (ref 8.4–10.5)
CO2: 26 mmol/L (ref 19–32)
Chloride: 107 mmol/L (ref 96–112)
Creatinine, Ser: 0.85 mg/dL (ref 0.50–1.10)
GFR calc Af Amer: 77 mL/min — ABNORMAL LOW (ref >90)
GFR calc non Af Amer: 67 mL/min — ABNORMAL LOW (ref >90)
GLUCOSE: 114 mg/dL — AB (ref 70–99)
Potassium: 3.4 mmol/L — ABNORMAL LOW (ref 3.5–5.1)
Sodium: 139 mmol/L (ref 135–145)

## 2014-06-21 LAB — URINE CULTURE: Colony Count: 80000

## 2014-06-21 LAB — LACTIC ACID, PLASMA: Lactic Acid, Venous: 1.5 mmol/L (ref 0.5–2.0)

## 2014-06-21 LAB — GLUCOSE, CAPILLARY
GLUCOSE-CAPILLARY: 112 mg/dL — AB (ref 70–99)
GLUCOSE-CAPILLARY: 116 mg/dL — AB (ref 70–99)
GLUCOSE-CAPILLARY: 98 mg/dL (ref 70–99)
Glucose-Capillary: 110 mg/dL — ABNORMAL HIGH (ref 70–99)
Glucose-Capillary: 148 mg/dL — ABNORMAL HIGH (ref 70–99)
Glucose-Capillary: 202 mg/dL — ABNORMAL HIGH (ref 70–99)

## 2014-06-21 LAB — CBC
HEMATOCRIT: 28.5 % — AB (ref 36.0–46.0)
Hemoglobin: 9.8 g/dL — ABNORMAL LOW (ref 12.0–15.0)
MCH: 29.9 pg (ref 26.0–34.0)
MCHC: 34.4 g/dL (ref 30.0–36.0)
MCV: 86.9 fL (ref 78.0–100.0)
Platelets: 167 10*3/uL (ref 150–400)
RBC: 3.28 MIL/uL — AB (ref 3.87–5.11)
RDW: 14.4 % (ref 11.5–15.5)
WBC: 11.2 10*3/uL — ABNORMAL HIGH (ref 4.0–10.5)

## 2014-06-21 LAB — LIPID PANEL
Cholesterol: 122 mg/dL (ref 0–200)
HDL: 27 mg/dL — AB (ref >39)
LDL CALC: 62 mg/dL (ref 0–99)
Total CHOL/HDL Ratio: 4.5 RATIO
Triglycerides: 165 mg/dL — ABNORMAL HIGH (ref <150)
VLDL: 33 mg/dL (ref 0–40)

## 2014-06-21 LAB — MAGNESIUM: Magnesium: 1.5 mg/dL (ref 1.5–2.5)

## 2014-06-21 LAB — HEMOGLOBIN A1C
HEMOGLOBIN A1C: 5.3 % (ref 4.8–5.6)
Mean Plasma Glucose: 105 mg/dL

## 2014-06-21 LAB — HERPES SIMPLEX VIRUS(HSV) DNA BY PCR
HSV 1 DNA: NEGATIVE
HSV 2 DNA: NEGATIVE

## 2014-06-21 MED ORDER — CHLORHEXIDINE GLUCONATE CLOTH 2 % EX PADS
6.0000 | MEDICATED_PAD | Freq: Once | CUTANEOUS | Status: AC
  Administered 2014-06-21: 6 via TOPICAL

## 2014-06-21 MED ORDER — CLONIDINE HCL 0.1 MG PO TABS
0.1000 mg | ORAL_TABLET | Freq: Two times a day (BID) | ORAL | Status: DC
  Administered 2014-06-21 – 2014-06-27 (×12): 0.1 mg via ORAL
  Filled 2014-06-21 (×13): qty 1

## 2014-06-21 MED ORDER — POTASSIUM CHLORIDE CRYS ER 20 MEQ PO TBCR: 40.0000 meq | EXTENDED_RELEASE_TABLET | Freq: Once | ORAL | Status: DC

## 2014-06-21 MED ORDER — ACYCLOVIR SODIUM 50 MG/ML IV SOLN
600.0000 mg | Freq: Three times a day (TID) | INTRAVENOUS | Status: DC
  Administered 2014-06-21 – 2014-06-22 (×3): 600 mg via INTRAVENOUS
  Filled 2014-06-21 (×5): qty 12

## 2014-06-21 MED ORDER — ASPIRIN 300 MG RE SUPP: 300.0000 mg | Freq: Every day | RECTAL | Status: DC

## 2014-06-21 MED ORDER — SODIUM CHLORIDE 0.9 % IV SOLN
INTRAVENOUS | Status: DC
  Administered 2014-06-21 – 2014-06-26 (×6): via INTRAVENOUS
  Filled 2014-06-21 (×13): qty 1000

## 2014-06-21 MED ORDER — ASPIRIN EC 325 MG PO TBEC
325.0000 mg | DELAYED_RELEASE_TABLET | Freq: Every day | ORAL | Status: DC
Start: 2014-06-21 — End: 2014-06-27
  Administered 2014-06-21 – 2014-06-27 (×7): 325 mg via ORAL
  Filled 2014-06-21 (×8): qty 1

## 2014-06-21 MED ORDER — SODIUM CHLORIDE 0.9 % IV SOLN
2.0000 g | INTRAVENOUS | Status: DC
  Administered 2014-06-21 – 2014-06-22 (×5): 2 g via INTRAVENOUS
  Filled 2014-06-21 (×8): qty 2000

## 2014-06-21 NOTE — Progress Notes (Addendum)
STROKE TEAM PROGRESS NOTE   HISTORY Michelle Farrell is a 74 y.o. female who was noticed to be abnormal last night, but this was attributed to her "night meds." Today, when the granddaughter found her, she was on the floor and unresponsive. She was brought to the ED where she was found to be febrile with a white count. UA shows 3- 6 WBC with + nitrites. Urosepsis was presumed and she was started on vanc/zosyn. Per daughter, she has had some improvement, but continues to be quite altered.   Family also reports occasional twitching, though none was observed on my exam.    LKW: 3/23 tpa given?: no, out of window.    SUBJECTIVE (INTERVAL HISTORY) Daughter at the bedside. Patient is more alert today, not back to baseline but is making some appropriate statements. At baseline she is fully independent and functional.   OBJECTIVE Temp:  [98.2 F (36.8 C)-98.9 F (37.2 C)] 98.4 F (36.9 C) (03/26 0546) Pulse Rate:  [70-86] 80 (03/26 0546) Cardiac Rhythm:  [-] Normal sinus rhythm;Sinus tachycardia (03/25 2058) Resp:  [13-29] 13 (03/26 0546) BP: (112-173)/(45-70) 163/70 mmHg (03/26 0546) SpO2:  [96 %-98 %] 98 % (03/26 0546) Weight:  [74.526 kg (164 lb 4.8 oz)] 74.526 kg (164 lb 4.8 oz) (03/26 0546)   Recent Labs Lab 06/20/14 1659 06/20/14 2058 06/21/14 0024 06/21/14 0539 06/21/14 0748  GLUCAP 137* 128* 98 110* 116*    Recent Labs Lab 06/19/14 1234 06/20/14 0520 06/21/14 0550  NA 139 137 139  K 3.9 2.8* 3.4*  CL 102 107 107  CO2 25 22 26   GLUCOSE 201* 129* 114*  BUN 8 6 <5*  CREATININE 1.21* 0.98 0.85  CALCIUM 10.3 9.1 9.3  MG  --   --  1.5    Recent Labs Lab 06/19/14 1234  AST 32  ALT 18  ALKPHOS 65  BILITOT 0.5  PROT 7.0  ALBUMIN 3.8    Recent Labs Lab 06/19/14 1234 06/20/14 0520 06/21/14 0550  WBC 16.1* 15.2* 11.2*  NEUTROABS 13.5*  --   --   HGB 12.5 11.2* 9.8*  HCT 35.8* 31.9* 28.5*  MCV 88.0 86.7 86.9  PLT 204 180 167    Recent Labs Lab  06/19/14 2023  CKTOTAL 624*    Recent Labs  06/19/14 2023  LABPROT 14.2  INR 1.09    Recent Labs  06/19/14 1432  COLORURINE YELLOW  LABSPEC 1.016  PHURINE 5.0  GLUCOSEU NEGATIVE  HGBUR MODERATE*  BILIRUBINUR NEGATIVE  KETONESUR NEGATIVE  PROTEINUR 100*  UROBILINOGEN 0.2  NITRITE POSITIVE*  LEUKOCYTESUR SMALL*       Component Value Date/Time   CHOL 122 06/21/2014 0550   TRIG 165* 06/21/2014 0550   HDL 27* 06/21/2014 0550   CHOLHDL 4.5 06/21/2014 0550   VLDL 33 06/21/2014 0550   LDLCALC 62 06/21/2014 0550   Lab Results  Component Value Date   HGBA1C 5.3 06/20/2014   No results found for: LABOPIA, COCAINSCRNUR, LABBENZ, AMPHETMU, THCU, LABBARB  No results for input(s): ETH in the last 168 hours.  Ct Head Wo Contrast 06/19/2014    1. No acute intracranial findings.  2. Mild chronic small vessel ischemia.      Ct Cervical Spine Wo Contrast 06/19/2014    1. No evidence of fracture or subluxation along the cervical spine.  2. Mild degenerative change along the lower cervical spine.  3. Calcification at the carotid bifurcations, more prominent on the right. Carotid ultrasound would be helpful for further evaluation, when  and as deemed clinically appropriate.  4. Mucosal thickening at the left side of the sphenoid sinus.      Mr Brain Wo Contrast 06/20/2014    1. 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  2. Remote medial left occipital lobe infarct.  3. Atrophy and minimal white matter changes otherwise within normal limits for age.  4. Chronic right maxillary sinus disease.     MRA Head 06/21/2014 1. Mildly motion degraded despite repeated imaging attempts. 2. Left PCA P1 segment irregularity in the region of the left thalamostriate artery origin likely corresponds to the acute left thalamus finding. 3. No other abnormality is evident on this intracranial MRA.   Dg Chest Port 1 View 06/19/2014    No acute  cardiopulmonary abnormality.     Dg Fluoro Guide Lumbar Puncture 06/20/2014    Successful lumbar puncture with fluoroscopy.     EEG 06/20/2014 Interpretation: This EEG is abnormal with moderately severe generalized nonspecific slowing of cerebral activity in addition to periodic sharp wave discharges recorded primarily from the left hemisphere, which would not to be epileptic in nature, and most often seen following acute strokes. No clear frank epileptiform discharges were recorded. PHYSICAL EXAM Physical exam: Exam: Gen: NAD Eyes: anicteric sclerae, moist conjunctivae                    CV: no MRG, no carotid bruits, no peripheral edema Mental Status: Alert, perseveration, does not follow commands  Neuro: Detailed Neurologic Exam  Speech:    +Aphasic, no dysarthria  Cranial Nerves:    The pupils are equal, round, and reactive to light.. Attempted, Fundi not visualized.  Conjugate gaze. Does not blink to threat. Face symmetric, Tongue midline. Right gaze preference, does not cross the midline. Does not blink to threat.  Motor Observation:    no involuntary movements noted. Tone appears normal.     Strength:    Difficult to perform full motor exam due to cognitive deficits but appears to be moving all extremities. Anti-gravity upper extremities. W/d bilat lowers but more briskly on the right.      Sensation:  Withdraws to stim x 4  Plantars right toe upgoing. Left downgoing.   DTRs: 2+ biceps, patellar       ASSESSMENT/PLAN [name] is a 74 y.o. female with history of congestive heart failure, hypertension, diabetes mellitus, and Crohn's disease presenting with altered mental status. She did not receive IV t-PA due to late presentation.   Stroke:  Dominant - Acute/subacute 10 mm nonhemorrhagic infarct within the posterior medial left thalamus.  MRI  as above  MRA  unremarkable  Carotid Doppler - pending  2D Echo - EF 60-65%. No cardiac source of emboli  identified.  EEG - abnormal as noted above - consistent with acute stroke.   Lumbar puncture - 06/20/2014 -   LDL - 62  HgbA1c 5.3  Subcutaneous heparin for VTE prophylaxis  Diet NPO time specified no liquids  no antithrombotic prior to admission, now on no antithrombotic Will start Aspirin suppositories 300 mg daily. NKDA  Ongoing aggressive stroke risk factor management  Therapy recommendations: Pending  Disposition:  Pending  Hypertension  Home meds: Clonidine, hydralazine, lisinopril, and spironolactone  Stable   Hyperlipidemia  Home meds:  Zocor 40 mg daily - not resumed in hospital  LDL 62, goal < 70  Restart Zocor when able to take PO meds  Continue statin at discharge  Diabetes  HgbA1c 5.3,  goal < 7.0  Controlled  Other Stroke Risk Factors  Advanced age  Cigarette smoker, advised to stop smoking - on nicoderm patches  Hx stroke/TIA   Other Active Problems  Hypokalemia  Anemia  Mild leukocytosis - afebrile  UTI - on antibiotics  Other Pertinent History    Personally examined patient and images, and have documentes history, physical, neuro exam,assessment and plan as stated above.   Sarina Ill, MD Stroke Neurology [phone] [facility]   A total of 26 minutes was spent face-to-face with this patient. Over half this time was spent on counseling patient and family on the stroke diagnosis and different diagnostic and therapeutic options available.          To contact Stroke Continuity provider, please refer to [email or web address] After hours, contact General Neurology

## 2014-06-21 NOTE — Evaluation (Signed)
Physical Therapy Evaluation Patient Details Name: Michelle Farrell MRN: [id number] DOB: 1940/08/31 Today's Date: 06/21/2014   History of Present Illness  74 y.o. female with a history of Crohn's disease, Diastolic CHF, HTN, IDDM, and Hyperlipidemia, COPD, neuropathy, chronic back pain admitted with nausea, vomiting, diarrhea, and periods of unresponsiveness.  MRI showed left dorsal BG infarct.  Work up continues  Clinical Impression  Pt admitted with/for n/v/d/ periods of unresponsiveness.  MRI showing infarct in BG.  Evaluation very limited due to pt not or unable to coorperate  Pt currently limited functionally due to the problems listed. ( See problems list.)   Pt will benefit from PT to maximize function and safety in order to get ready for next venue listed below.     Follow Up Recommendations SNF;Other (comment) (but if has 24 hour supervision could decide to go home.)    Equipment Recommendations  None recommended by PT    Recommendations for Other Services       Precautions / Restrictions Precautions Precautions: Fall      Mobility  Bed Mobility Overal bed mobility: Needs Assistance Bed Mobility: Supine to Sit;Sit to Supine     Supine to sit: Min assist;Supervision Sit to supine: Min assist;Supervision   General bed mobility comments: Pt resistant to moving, but when finally made aware that she was laying in a pool of pee, she got up without assist  Transfers Overall transfer level: Needs assistance Equipment used: 1 person hand held assist Transfers: Sit to/from Stand Sit to Stand: Min assist         General transfer comment: steady assist due to pt lethargic/sleepy  Ambulation/Gait             General Gait Details: not addressed  Stairs            Wheelchair Mobility    Modified Rankin (Stroke Patients Only) Modified Rankin (Stroke Patients Only) Pre-Morbid Rankin Score: No symptoms Modified Rankin: Moderately severe disability      Balance Overall balance assessment: Needs assistance Sitting-balance support: No upper extremity supported Sitting balance-Leahy Scale: Fair Sitting balance - Comments: sat without assist, but not interacting to get a full sense of balance                                     Pertinent Vitals/Pain Pain Assessment: Faces Pain Score: 0-No pain Faces Pain Scale: No hurt    Home Living Family/patient expects to be discharged to:: Private residence Living Arrangements: Alone;Other relatives Available Help at Discharge: Family Type of Home: Apartment Home Access: Stairs to enter   Entrance Stairs-Number of Steps: 3 Home Layout: One level Home Equipment: Cane - single point;Walker - 2 wheels      Prior Function Level of Independence: Independent         Comments: has RW and cane but doesn't use per daughter     Hand Dominance   Dominant Hand: Right    Extremity/Trunk Assessment   Upper Extremity Assessment: Defer to OT evaluation           Lower Extremity Assessment:  (patient stood at EOB with minimal assist)         Communication   Communication: No difficulties  Cognition Arousal/Alertness: Lethargic Behavior During Therapy:  (sedated/sleepy) Overall Cognitive Status: Impaired/Different from baseline  General Comments General comments (skin integrity, edema, etc.): pt not following commands, resistant to moving or "playing possum" at times until she was finally made to realize she was lying in urine.  Then she participated minimally.  LImited evaluation    Exercises        Assessment/Plan    PT Assessment Patient needs continued PT services  PT Diagnosis Altered mental status;Difficulty walking   PT Problem List Decreased strength;Decreased activity tolerance;Decreased balance;Decreased mobility;Decreased cognition  PT Treatment Interventions DME instruction;Gait training;Stair training;Functional  mobility training;Therapeutic activities;Balance training;Patient/family education;Neuromuscular re-education   PT Goals (Current goals can be found in the Care Plan section) Acute Rehab PT Goals Patient Stated Goal: none stated PT Goal Formulation: Patient unable to participate in goal setting Time For Goal Achievement: 07/05/14 Potential to Achieve Goals: Good    Frequency Min 3X/week   Barriers to discharge        Co-evaluation               End of Session   Activity Tolerance: Patient limited by lethargy Patient left: in bed;with call bell/phone within reach Nurse Communication: Mobility status         Time: 2567-2091 PT Time Calculation (min) (ACUTE ONLY): 22 min   Charges:   PT Evaluation $Initial PT Evaluation Tier I: 1 Procedure     PT G Codes:        Jatorian Renault, Tessie Fass 06/21/2014, 1:35 PM 06/21/2014  Donnella Sham, PT [phone]  (pager)

## 2014-06-21 NOTE — Evaluation (Signed)
Clinical/Bedside Swallow Evaluation Patient Details  Name: Michelle Farrell MRN: [id number] Date of Birth: 07/16/1940  Today's Date: 06/21/2014 Time: SLP Start Time (ACUTE ONLY): 0945 SLP Stop Time (ACUTE ONLY): 1020 SLP Time Calculation (min) (ACUTE ONLY): 35 min  Past Medical History:  Past Medical History  Diagnosis Date  . CHF (congestive heart failure)   . Hypertension   . Back pain, chronic   . Hip pain, chronic   . GERD (gastroesophageal reflux disease)   . Crohn disease   . Diabetes mellitus without complication    Past Surgical History:  Past Surgical History  Procedure Laterality Date  . Abdominal hysterectomy    . Tonsillectomy    . Cholecystectomy    . Bladder surgery     HPI:  Michelle Farrell is a 74 y.o. female, with a pmh of heart failure (unspecified), diabetes mellitus, chronic hip and back and leg pain, hypertension, and well-controlled Crohn's disease. She has had 2 recent hospitalizations in the last 4.5 weeks. She was discharged on February 21 after suffering with nausea, vomiting, diarrhea, and acute renal failure. She was then discharged on March 21 after having nausea, vomiting, diarrhea and acute renal failure. The patient is currently lethargic and not speaking on 06/19/14, consequently history was collected from her granddaughter Michelle Farrell who is a Designer, jewellery at [employer] [facility] [employer] [employer]. Michelle Farrell relates that her grandmother was not completely well when she left the hospital on 06/16/14. She has been having low-grade temperatures and odorous urine.  Her grandaughter found her unresponsive on 06/19/14 at her home.       Assessment / Plan / Recommendation Clinical Impression   Pt exhibited s/s of aspiration including multiple swallows with thin and LOA and cognitive impairments exacerbate the probability of aspiration with thin at this time, although frank s/s of aspiration other than multiple swallows not noted during BSE.  Oral phase with thin-solids noted decreased  lingual propulsion/impaired mastication (with solids) and pharyngeal symptoms include multiple swallows, so nectar-thickened liquids should be implemented d/t multiple medical issues such as decreased LOA, decreased sustained attention/MRA results on 06/21/14 indicating Left PCA P1 segment irregularity in the region of the left thalamostriate artery origin likely corresponds to the acute left thalamus finding.    Aspiration Risk  Mild    Diet Recommendation Dysphagia 2 (Fine chop);Nectar-thick liquid (d/t LOA)   Liquid Administration via: Cup;No straw Medication Administration: Crushed with puree Supervision: Full supervision/cueing for compensatory strategies Compensations: Slow rate;Small sips/bites Postural Changes and/or Swallow Maneuvers: Seated upright 90 degrees;Upright 30-60 min after meal    Other  Recommendations Recommended Consults: Other (Comment) (possible MBS prn) Oral Care Recommendations: Oral care BID Other Recommendations: Order thickener from pharmacy   Follow Up Recommendations  Inpatient Rehab    Frequency and Duration min 2x/week  2 weeks   Pertinent Vitals/Pain Elevated BP; afebrile    SLP Swallow Goals  See POC   Swallow Study Prior Functional Status   independent at home; family available 24 hrs/day    General Date of Onset: 06/19/14 HPI: Michelle Farrell is a 74 y.o. female, with a pmh of heart failure (unspecified), diabetes mellitus, chronic hip and back and leg pain, hypertension, and well-controlled Crohn's disease. She has had 2 recent hospitalizations in the last 4.5 weeks. She was discharged on February 21 after suffering with nausea, vomiting, diarrhea, and acute renal failure. She was then discharged on March 21 after having nausea, vomiting, diarrhea and acute renal failure. The patient is currently lethargic and not speaking on  06/19/14, consequently history was collected from her granddaughter Michelle Farrell who is a Designer, jewellery at [employer].  Michelle Farrell relates that her grandmother was not completely well when she left the hospital on 06/16/14. She has been having low-grade temperatures and odorous urine.  Her grandaughter found her unresponsive on 06/19/14 at her home.     Type of Study: Bedside swallow evaluation Previous Swallow Assessment: n/a Diet Prior to this Study: NPO Temperature Spikes Noted: No Respiratory Status: Room air Behavior/Cognition: Confused;Distractible;Requires cueing;Decreased sustained attention;Lethargic Oral Cavity - Dentition: Edentulous Self-Feeding Abilities: Needs assist;Needs set up Patient Positioning: Upright in bed Baseline Vocal Quality: Low vocal intensity Volitional Cough: Cognitively unable to elicit Volitional Swallow: Able to elicit    Oral/Motor/Sensory Function Overall Oral Motor/Sensory Function: Other (comment) (unable to follow all commands d/t cognitive status) Labial Symmetry: Within Functional Limits Lingual Symmetry: Other (Comment) (unable to assess) Facial Symmetry: Within Functional Limits Mandible: Within Functional Limits   Ice Chips Ice chips: Within functional limits Presentation: Spoon   Thin Liquid Thin Liquid: Impaired Presentation: Cup Oral Phase Impairments: Reduced lingual movement/coordination Pharyngeal  Phase Impairments: Multiple swallows    Nectar Thick Nectar Thick Liquid: Not tested   Honey Thick Honey Thick Liquid: Not tested   Puree Puree: Within functional limits Presentation: Spoon   Solid       Solid: Impaired Presentation: Spoon Oral Phase Impairments: Impaired anterior to posterior transit;Impaired mastication Pharyngeal Phase Impairments: Multiple swallows       Michelle Farrell,PAT, M.S., CCC-SLP 06/21/2014,12:51 PM

## 2014-06-21 NOTE — Progress Notes (Signed)
ANTIBIOTIC CONSULT NOTE - FOLLOW UP  Pharmacy Consult for vancomycin, ampicillin, acyclovir Indication: r/o meningitis  No Known Allergies  Patient Measurements: Height: 5' 6"  (167.6 cm) Weight: 164 lb 4.8 oz (74.526 kg) IBW/kg (Calculated) : 59.3  Vital Signs: Temp: 98.3 F (36.8 C) (03/26 0815) Temp Source: Axillary (03/26 0815) BP: 177/84 mmHg (03/26 1056) Pulse Rate: 82 (03/26 0815) Intake/Output from previous day: 03/25 0701 - 03/26 0700 In: 791.3 [I.V.:791.3] Out: -  Intake/Output from this shift:    Labs:  Recent Labs  06/19/14 1234 06/20/14 0520 06/21/14 0550  WBC 16.1* 15.2* 11.2*  HGB 12.5 11.2* 9.8*  PLT 204 180 167  CREATININE 1.21* 0.98 0.85   Estimated Creatinine Clearance: 61.8 mL/min (by C-G formula based on Cr of 0.85). No results for input(s): VANCOTROUGH, VANCOPEAK, VANCORANDOM, GENTTROUGH, GENTPEAK, GENTRANDOM, TOBRATROUGH, TOBRAPEAK, TOBRARND, AMIKACINPEAK, AMIKACINTROU, AMIKACIN in the last 72 hours.   Microbiology: Recent Results (from the past 720 hour(s))  Blood Culture (routine x 2)     Status: None (Preliminary result)   Collection Time: 06/19/14 12:23 PM  Result Value Ref Range Status   Specimen Description BLOOD LEFT ANTECUBITAL  Final   Special Requests BOTTLES DRAWN AEROBIC AND ANAEROBIC 3CC  Final   Culture   Final           BLOOD CULTURE RECEIVED NO GROWTH TO DATE CULTURE WILL BE HELD FOR 5 DAYS BEFORE ISSUING A FINAL NEGATIVE REPORT Performed at [facility]    Report Status PENDING  Incomplete  Blood Culture (routine x 2)     Status: None (Preliminary result)   Collection Time: 06/19/14 12:36 PM  Result Value Ref Range Status   Specimen Description BLOOD LEFT ANTECUBITAL  Final   Special Requests BOTTLES DRAWN AEROBIC AND ANAEROBIC 5ML  Final   Culture   Final           BLOOD CULTURE RECEIVED NO GROWTH TO DATE CULTURE WILL BE HELD FOR 5 DAYS BEFORE ISSUING A FINAL NEGATIVE REPORT Performed at [facility]     Report Status PENDING  Incomplete  Urine culture     Status: None (Preliminary result)   Collection Time: 06/19/14  2:32 PM  Result Value Ref Range Status   Specimen Description URINE, CLEAN CATCH  Final   Special Requests NONE  Final   Colony Count   Final    80,000 COLONIES/ML Performed at [facility]    Culture   Final    ESCHERICHIA COLI Performed at [facility]    Report Status PENDING  Incomplete  MRSA PCR Screening     Status: None   Collection Time: 06/19/14  8:30 PM  Result Value Ref Range Status   MRSA by PCR NEGATIVE NEGATIVE Final    Comment:        The GeneXpert MRSA Assay (FDA approved for NASAL specimens only), is one component of a comprehensive MRSA colonization surveillance program. It is not intended to diagnose MRSA infection nor to guide or monitor treatment for MRSA infections.   CSF culture     Status: None (Preliminary result)   Collection Time: 06/20/14 11:33 AM  Result Value Ref Range Status   Specimen Description CSF  Final   Special Requests NONE  Final   Gram Stain   Final    FEW WBC PRESENT, PREDOMINANTLY PMN NO ORGANISMS SEEN Gram Stain Report Called to,Read Back By and Verified With: Gram Stain Report Called to,Read Back By and Verified With: Johny Shock  [id number]@1 :00AM BJENN Performed at [place] Performed at [facility]   Final   Report Status PENDING  Incomplete  Gram stain     Status: None   Collection Time: 06/20/14 11:33 AM  Result Value Ref Range Status   Specimen Description CSF  Final   Special Requests NONE  Final   Gram Stain   Final    CYTOSPIN PREP WBC PRESENT, PREDOMINANTLY PMN NO ORGANISMS SEEN    Report Status 06/20/2014 FINAL  Final    Anti-infectives    Start     Dose/Rate Route Frequency Ordered Stop   06/20/14 1500  vancomycin (VANCOCIN) 1,250 mg in sodium chloride 0.9 % 250 mL IVPB  Status:  Discontinued     1,250 mg 166.7 mL/hr over 90  Minutes Intravenous Every 24 hours 06/19/14 1349 06/20/14 1049   06/20/14 1400  vancomycin (VANCOCIN) IVPB 750 mg/150 ml premix     750 mg 150 mL/hr over 60 Minutes Intravenous Every 12 hours 06/20/14 1049     06/20/14 1400  ampicillin (OMNIPEN) 2 g in sodium chloride 0.9 % 50 mL IVPB     2 g 150 mL/hr over 20 Minutes Intravenous Every 6 hours 06/20/14 1334     06/20/14 0200  acyclovir (ZOVIRAX) 600 mg in dextrose 5 % 100 mL IVPB     600 mg 112 mL/hr over 60 Minutes Intravenous Every 12 hours 06/19/14 2313     06/20/14 0000  ampicillin (OMNIPEN) 2 g in sodium chloride 0.9 % 50 mL IVPB  Status:  Discontinued     2 g 150 mL/hr over 20 Minutes Intravenous 4 times per day 06/19/14 2313 06/20/14 1334   06/19/14 2315  cefTRIAXone (ROCEPHIN) 2 g in dextrose 5 % 50 mL IVPB - Premix     2 g 100 mL/hr over 30 Minutes Intravenous Every 12 hours 06/19/14 2313     06/19/14 1800  piperacillin-tazobactam (ZOSYN) IVPB 3.375 g  Status:  Discontinued     3.375 g 12.5 mL/hr over 240 Minutes Intravenous Every 8 hours 06/19/14 1350 06/19/14 2243   06/19/14 1400  vancomycin (VANCOCIN) 500 mg in sodium chloride 0.9 % 100 mL IVPB     500 mg 100 mL/hr over 60 Minutes Intravenous  Once 06/19/14 1349 06/19/14 1548   06/19/14 1215  piperacillin-tazobactam (ZOSYN) IVPB 3.375 g     3.375 g 100 mL/hr over 30 Minutes Intravenous  Once 06/19/14 1201 06/19/14 1329   06/19/14 1215  vancomycin (VANCOCIN) IVPB 1000 mg/200 mL premix  Status:  Discontinued     1,000 mg 200 mL/hr over 60 Minutes Intravenous  Once 06/19/14 1201 06/19/14 1349      Assessment: 74 yo female admitted on 06/19/2014 with AMS and neckstiffness and concern for meningitis. She is on rocephin/ampicillin/vancomycin and acyclovir. WBC trended down to 11.2, tmax is 98.9, SCr= 0.85 (down from 1.21 on 3/24) and CrCL ~ 61.  Zosyn 3/24 >> 3/24 Vanc 3/24>> Ampicillin 3/24>> Rocephin 3/24>> Acyclovir 3/24>>  3/24 blood x2>>ngtd 3/24 urine>>Ecoli  (80000) 3/25 CSF>>ngtd 3/25 HSV pcr>>IP 3/25 Influenza panel>>NEG   Goal of Therapy:  Vancomycin trough level 15-20 mcg/ml  Plan:  - Continue vancomycin at 733m IV q12h - Adjust ampicillin dose to 2 gm IV q4h with improvement in renal function - Adjust acyclovir dose to 6037mIV q8h with improvement in renal function - Continue CTX 2 gm IV q12h - Will follow renal function, cultures and clinical progress - [facility]  at [facility] tomorrow afternoon  Ruta Hinds. Velva Harman, PharmD, [place] Clinical Pharmacist - Resident Pager: [phone] Pharmacy: [phone] 06/21/2014 12:31 PM

## 2014-06-21 NOTE — Evaluation (Signed)
Speech Language Pathology Evaluation Patient Details Name: Michelle Farrell MRN: [id number] DOB: [birth year] Today's Date: 06/21/2014 Time: 0300-9233 SLP Time Calculation (min) (ACUTE ONLY): 35 min  Problem List:  Patient Active Problem List   Diagnosis Date Noted  . CVA (cerebral infarction)   . Sepsis 06/19/2014  . Acute encephalopathy 06/19/2014  . UTI (lower urinary tract infection) 06/19/2014  . Tobacco abuse 06/19/2014  . Acute gastroenteritis 06/12/2014  . Crohn's disease 06/12/2014  . DM (diabetes mellitus) 06/12/2014  . Metabolic acidosis 00/76/2263  . Hypercalcemia 06/12/2014  . Oral candidiasis 06/12/2014  . Renal failure 05/16/2014  . AKI (acute kidney injury) 05/16/2014  . Dehydration 05/16/2014  . Pelvic mass 05/16/2014  . Hypertension 05/16/2014  . Essential hypertension 05/16/2014  . Diastolic CHF 33/54/5625  . Back pain, chronic 05/16/2014  . Accelerated hypertension 08/28/2013  . DKA (diabetic ketoacidoses) 08/27/2013  . OBSTRUCTIVE SLEEP APNEA 11/14/2008  . DYSPNEA 11/14/2008  . Diabetes mellitus 06/28/2007  . HYPERLIPIDEMIA 06/28/2007  . NEUROPATHY 06/28/2007  . CHF 06/28/2007  . ESOPHAGEAL STRICTURE 06/28/2007  . HIATAL HERNIA 06/28/2007  . Crohn disease 06/28/2007  . POLYARTHRITIS 06/28/2007  . ELEVATED BLOOD PRESSURE 06/28/2007  . ALLERGY 06/28/2007  . PUD, HX OF 06/28/2007   Past Medical History:  Past Medical History  Diagnosis Date  . CHF (congestive heart failure)   . Hypertension   . Back pain, chronic   . Hip pain, chronic   . GERD (gastroesophageal reflux disease)   . Crohn disease   . Diabetes mellitus without complication    Past Surgical History:  Past Surgical History  Procedure Laterality Date  . Abdominal hysterectomy    . Tonsillectomy    . Cholecystectomy    . Bladder surgery     HPI:  Cambrey Farrell is a 74 y.o. female, with a pmh of heart failure (unspecified), diabetes mellitus, chronic hip and back and leg pain,  hypertension, and well-controlled Crohn's disease. She has had 2 recent hospitalizations in the last 4.5 weeks. She was discharged on February 21 after suffering with nausea, vomiting, diarrhea, and acute renal failure. She was then discharged on March 21 after having nausea, vomiting, diarrhea and acute renal failure. The patient is currently lethargic and not speaking on 06/19/14, consequently history was collected from her granddaughter Michelle Farrell who is a Designer, jewellery at [employer]. Michelle Farrell relates that her grandmother was not completely well when she left the hospital on 06/16/14. She has been having low-grade temperatures and odorous urine.  Her grandaughter found her unresponsive on 06/19/14 at her home.  MRA on 06/21/14 indicated Left PCA P1 segment irregularity in the region of the left thalamostriate artery origin likely corresponds to the acute left thalamus finding.  Assessment / Plan / Recommendation Clinical Impression   Pt able to follow simple 1-step commands inconsistently (with familiar family member) during SLE, but followed 1-step commands with SLP with 40% accuracy during various functional tasks given visual cues; pt indicated yes/no with 50% accuracy with simple questions; not oriented to person, place, situation or time and attended to tasks with maximum visual/auditory cueing from SLP/caregiver.  Her granddaughter stated she was independent at home prior to this hospital admission on 06/19/14, so this is a significant change in cognitive function at present time.      SLP Assessment  Patient needs continued Speech Language Pathology Services    Follow Up Recommendations  Inpatient Rehab    Frequency and Duration min 2x/week  1 week   Pertinent Vitals/Pain  Pain Assessment: Faces Pain Score: 0-No pain Faces Pain Scale: No hurt   SLP Goals  Potential to Achieve Goals (ACUTE ONLY): Good Potential Considerations (ACUTE ONLY): Previous level of function  SLP Evaluation Prior  Functioning  Cognitive/Linguistic Baseline: Within functional limits Type of Home: Apartment  Lives With: Alone Available Help at Discharge: Family   Cognition  Overall Cognitive Status: Impaired/Different from baseline Arousal/Alertness: Lethargic Orientation Level: Disoriented X4 Attention: Sustained Sustained Attention: Impaired Sustained Attention Impairment: Verbal basic;Functional basic Memory: Impaired Awareness: Impaired Problem Solving: Impaired Problem Solving Impairment: Verbal basic;Functional basic Behaviors: Restless;Physical agitation;Verbal agitation;Impulsive Safety/Judgment: Impaired    Comprehension  Auditory Comprehension Overall Auditory Comprehension: Impaired Yes/No Questions: Impaired Basic Biographical Questions: 0-25% accurate Commands: Impaired Two Step Basic Commands: 25-49% accurate Conversation: Simple Interfering Components: Attention;Processing speed EffectiveTechniques: Repetition;Slowed speech;Visual/Gestural cues;Extra processing time Visual Recognition/Discrimination Discrimination: Not tested Reading Comprehension Reading Status: Not tested    Expression Expression Primary Mode of Expression: Verbal Verbal Expression Overall Verbal Expression: Impaired Initiation: Impaired Level of Generative/Spontaneous Verbalization: Word Repetition: Impaired Level of Impairment: Phrase level Naming: Impairment Responsive: 0-25% accurate Confrontation: Impaired Convergent: 0-24% accurate Divergent: 0-24% accurate Verbal Errors: Neologisms;Confabulation;Perseveration;Not aware of errors Pragmatics: Unable to assess Interfering Components: Attention Non-Verbal Means of Communication: Not applicable Written Expression Dominant Hand: Right Written Expression: Not tested   Oral / Motor Oral Motor/Sensory Function Overall Oral Motor/Sensory Function: Other (comment) Labial Symmetry: Within Functional Limits Lingual Symmetry: Other  (Comment) Facial Symmetry: Within Functional Limits Mandible: Within Functional Limits Motor Speech Respiration: Within functional limits Phonation: Low vocal intensity Resonance: Within functional limits Articulation: Impaired Level of Impairment: Word Intelligibility: Intelligibility reduced Word: 50-74% accurate Phrase: 0-24% accurate Sentence: 0-24% accurate Conversation: 0-24% accurate Motor Planning: Not tested Motor Speech Errors: Not applicable        ADAMS,PAT, M.S., CCC-SLP 06/21/2014, 1:17 PM

## 2014-06-21 NOTE — Progress Notes (Signed)
[facility] PROGRESS NOTE  Jacynda Brunke XTK:[id number] DOB: November 04, 1940 DOA: 06/19/2014 PCP: Hermelinda Medicus, MD  Assessment/Plan: 1. Severe sepsis- patient came with severe sepsis with temperature 102.5 leukocytosis, lactic acidosis and tachypnea with respirations in the 30s. Likely the source is UTI, CSF analysis does not show any infectious etiology. Patient was started on vancomycin, ampicillin and Rocephin for possible bacterial meningitis. Neurology is following, will narrow down antibiotics once more data is available. WBC down to 11.2 2. Acute encephalopathy- resolving, likely from UTI, MRI showed acute stroke in posterior medial thalamus. Neurology following. 3. 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 Nothing by mouth for now until more alert to proceed with speech therapy. 2-D echo showed EF 60-65%, no thrombus noted in the echo. Carotid duplex is pending. 4. Hypokalemia- potassium is 3.4 today. Will change the IV fluids to normal saline with 20 of KCl at 75 per hour. Will check BMP in a.m. 5. UTI- patient came with abnormal UA. Urine cultures are growing Escherichia coli. In the new Rocephin 6. Hypertension- patient currently on hydralazine. BP is now elevated Will restart Catapres 0.1 twice a day, rest of the medications are on hold for permissive hypertension. 7. Diabetes mellitus- patient is currently on sliding-scale insulin. Blood glucose is well controlled. 8. Acute kidney injury- resolved off to IV fluids. Creatinine 0.85 today.  Code Status: Full code Family Communication: *Discussed with patient's granddaughter at bedside Disposition Plan: *To be decided   Consultants:  None  Procedures:  None  Antibiotics:  Rocephin 3/24  Vancomycin 3/24  Ampicillin 3/24  HPI/Subjective: *74 y.o. female, with PMH of heart failure (unspecified), diabetes mellitus, chronic hip, back, and leg pain, hypertension, and well-controlled Crohn's  disease, 2 recent hospitalizations in the last 4.5 weeks. She was discharged on February 21 after suffering with nausea, vomiting, diarrhea, and acute renal failure and was discharged again on March 21 after having same symptoms. On admission, pt was lethargic and unable to provide any history. Granddaughter provided history and reports malodorous urine and low grade fevers, also reports that pt has been complaining of abd pain. Granddaughter also reported poor oral intake and mostly non productive cough. She found her on the floor when she went to check up on her at 10 am morning of admission. Patient underwent lumbar puncture, CSF analysis is negative for infection.  Today patient is more alert and answering questions as per granddaughter.  Objective: Filed Vitals:   06/21/14 1056  BP: 177/84  Pulse:   Temp:   Resp:     Intake/Output Summary (Last 24 hours) at 06/21/14 1103 Last data filed at 06/21/14 0500  Gross per 24 hour  Intake 791.25 ml  Output      0 ml  Net 791.25 ml   Filed Weights   06/19/14 1310 06/20/14 0500 06/21/14 0546  Weight: 74.844 kg (165 lb) 74.617 kg (164 lb 8 oz) 74.526 kg (164 lb 4.8 oz)    Exam:   General:  Lethargic though arousable and able to answer questions.  Cardiovascular: S1-S2 is regular  Respiratory: Clear to auscultation bilaterally  Abdomen: Soft, nontender no organomegaly  Musculoskeletal: No edema of the lower extremities.   Data Reviewed: Basic Metabolic Panel:  Recent Labs Lab 06/19/14 1234 06/20/14 0520 06/21/14 0550  NA 139 137 139  K 3.9 2.8* 3.4*  CL 102 107 107  CO2 25 22 26   GLUCOSE 201* 129* 114*  BUN 8 6 <5*  CREATININE 1.21* 0.98 0.85  CALCIUM 10.3 9.1  9.3  MG  --   --  1.5   Liver Function Tests:  Recent Labs Lab 06/19/14 1234  AST 32  ALT 18  ALKPHOS 65  BILITOT 0.5  PROT 7.0  ALBUMIN 3.8   No results for input(s): LIPASE, AMYLASE in the last 168 hours. No results for input(s): AMMONIA in the  last 168 hours. CBC:  Recent Labs Lab 06/19/14 1234 06/20/14 0520 06/21/14 0550  WBC 16.1* 15.2* 11.2*  NEUTROABS 13.5*  --   --   HGB 12.5 11.2* 9.8*  HCT 35.8* 31.9* 28.5*  MCV 88.0 86.7 86.9  PLT 204 180 167   Cardiac Enzymes:  Recent Labs Lab 06/19/14 2023  CKTOTAL 624*   BNP (last 3 results) No results for input(s): BNP in the last 8760 hours.  ProBNP (last 3 results)  Recent Labs  08/27/13 2205 08/28/13 0454  PROBNP 263.1* 251.0*    CBG:  Recent Labs Lab 06/20/14 1659 06/20/14 2058 06/21/14 0024 06/21/14 0539 06/21/14 0748  GLUCAP 137* 128* 98 110* 116*    Recent Results (from the past 240 hour(s))  Blood Culture (routine x 2)     Status: None (Preliminary result)   Collection Time: 06/19/14 12:23 PM  Result Value Ref Range Status   Specimen Description BLOOD LEFT ANTECUBITAL  Final   Special Requests BOTTLES DRAWN AEROBIC AND ANAEROBIC 3CC  Final   Culture   Final           BLOOD CULTURE RECEIVED NO GROWTH TO DATE CULTURE WILL BE HELD FOR 5 DAYS BEFORE ISSUING A FINAL NEGATIVE REPORT Performed at [facility]    Report Status PENDING  Incomplete  Blood Culture (routine x 2)     Status: None (Preliminary result)   Collection Time: 06/19/14 12:36 PM  Result Value Ref Range Status   Specimen Description BLOOD LEFT ANTECUBITAL  Final   Special Requests BOTTLES DRAWN AEROBIC AND ANAEROBIC 5ML  Final   Culture   Final           BLOOD CULTURE RECEIVED NO GROWTH TO DATE CULTURE WILL BE HELD FOR 5 DAYS BEFORE ISSUING A FINAL NEGATIVE REPORT Performed at [facility]    Report Status PENDING  Incomplete  Urine culture     Status: None (Preliminary result)   Collection Time: 06/19/14  2:32 PM  Result Value Ref Range Status   Specimen Description URINE, CLEAN CATCH  Final   Special Requests NONE  Final   Colony Count   Final    80,000 COLONIES/ML Performed at [facility]    Culture   Final    ESCHERICHIA COLI Performed  at [facility]    Report Status PENDING  Incomplete  MRSA PCR Screening     Status: None   Collection Time: 06/19/14  8:30 PM  Result Value Ref Range Status   MRSA by PCR NEGATIVE NEGATIVE Final    Comment:        The GeneXpert MRSA Assay (FDA approved for NASAL specimens only), is one component of a comprehensive MRSA colonization surveillance program. It is not intended to diagnose MRSA infection nor to guide or monitor treatment for MRSA infections.   CSF culture     Status: None (Preliminary result)   Collection Time: 06/20/14 11:33 AM  Result Value Ref Range Status   Specimen Description CSF  Final   Special Requests NONE  Final   Gram Stain   Final    FEW WBC PRESENT, PREDOMINANTLY  PMN NO ORGANISMS SEEN Gram Stain Report Called to,Read Back By and Verified With: Gram Stain Report Called to,Read Back By and Verified With: ALLISON DUNDON [id number]@1 :00AM BJENN Performed at [facility]    Culture PENDING  Incomplete   Report Status PENDING  Incomplete  Gram stain     Status: None   Collection Time: 06/20/14 11:33 AM  Result Value Ref Range Status   Specimen Description CSF  Final   Special Requests NONE  Final   Gram Stain   Final    CYTOSPIN PREP WBC PRESENT, PREDOMINANTLY PMN NO ORGANISMS SEEN    Report Status 06/20/2014 FINAL  Final     Studies: Ct Head Wo Contrast  06/19/2014   CLINICAL DATA:  Altered mental status, nonverbal with fixed right gaze.  EXAM: CT HEAD WITHOUT CONTRAST  TECHNIQUE: Contiguous axial images were obtained from the base of the skull through the vertex without intravenous contrast.  COMPARISON:  None.  FINDINGS: Skull and Sinuses:No fracture or aggressive lesion. Fluid level in the right maxillary sinus and mild scattered inflammatory mucosal thickening is likely incidental given the history.  Orbits: Rightward deviation of gaze.  Brain: No evidence of acute infarction, hemorrhage, hydrocephalus, or mass lesion/mass effect. The  insular ribbon on the left is somewhat indistinct inferiorly, but symmetric. Mild periventricular and subcortical left occipital low-density is most likely chronic small vessel ischemia.  IMPRESSION: 1. No acute intracranial findings. 2. Mild chronic small vessel ischemia.   Electronically Signed   By: Monte Fantasia M.D.   On: 06/19/2014 14:50   Ct Cervical Spine Wo Contrast  06/19/2014   CLINICAL DATA:  Found on floor, with right-sided gaze. Concern for cervical spine injury. Initial encounter.  EXAM: CT CERVICAL SPINE WITHOUT CONTRAST  TECHNIQUE: Multidetector CT imaging of the cervical spine was performed without intravenous contrast. Multiplanar CT image reconstructions were also generated.  COMPARISON:  None.  FINDINGS: There is no evidence of fracture or subluxation. Vertebral bodies demonstrate normal height and alignment. Multilevel disc space narrowing is noted along the lower cervical spine, with anterior and posterior disc osteophyte complexes. Prevertebral soft tissues are within normal limits.  The thyroid gland is unremarkable in appearance. The visualized lung apices are clear. Calcification is noted at the carotid bifurcations, more prominent on the right. Mucosal thickening is noted at the left side of the sphenoid sinus.  IMPRESSION: 1. No evidence of fracture or subluxation along the cervical spine. 2. Mild degenerative change along the lower cervical spine. 3. Calcification at the carotid bifurcations, more prominent on the right. Carotid ultrasound would be helpful for further evaluation, when and as deemed clinically appropriate. 4. Mucosal thickening at the left side of the sphenoid sinus.   Electronically Signed   By: Garald Balding M.D.   On: 06/19/2014 17:29   Mr Virgel Paling Wo Contrast  06/21/2014   CLINICAL DATA:  74 year old female with altered mental status, found down. MRI reveals dorsal left thalamus signal abnormality suspicious for infarct. Initial encounter.  EXAM: MRA HEAD  WITHOUT CONTRAST  TECHNIQUE: Angiographic images of the Circle of Willis were obtained using MRA technique without intravenous contrast.  COMPARISON:  Brain MRI 06/20/2014 and earlier.  FINDINGS: Study is mildly degraded by motion artifact despite repeated imaging attempts.  Antegrade flow in both distal vertebral arteries in the posterior circulation. Both PICA origins appear patent. Patent vertebrobasilar junction. No evidence of basilar artery stenosis.  Both PCA origins are patent. There is mild to moderate irregularity of the left  PCA P1 segment in the region of the thalamostriate artery origin. Otherwise bilateral PCA branches are within normal limits.  Posterior communicating arteries are diminutive or absent.  Antegrade flow in both ICA siphons. No evidence of siphon stenosis. Ophthalmic artery origins are within normal limits. Carotid termini are patent. MCA and ACA origins are patent.  Visualized bilateral MCA and ACA branches are degraded by motion, with no major branch occlusion identified.  IMPRESSION: 1. Mildly motion degraded despite repeated imaging attempts. 2. Left PCA P1 segment irregularity in the region of the left thalamostriate artery origin likely corresponds to the acute left thalamus finding. 3. No other abnormality is evident on this intracranial MRA.   Electronically Signed   By: Genevie Ann M.D.   On: 06/21/2014 10:01   Mr Brain Wo Contrast  06/20/2014   CLINICAL DATA:  Altered mental status. Found on the floor unresponsive yesterday.  EXAM: MRI HEAD WITHOUT CONTRAST  TECHNIQUE: Multiplanar, multiecho pulse sequences of the brain and surrounding structures were obtained without intravenous contrast.  COMPARISON:  CT HEAD WITHOUT CONTRAST 06/19/2014  FINDINGS: The diffusion-weighted images demonstrate an acute/subacute nonhemorrhagic infarct involving the posterior medial left thalamus. The infarct measures 10 mm maximally. Subtle T2 changes are associated with the infarct.  A remote left  occipital lobe scratch side a remote medial left occipital lobe infarct is noted. Minimal scratch the mild periventricular white matter changes are present bilaterally. Minimal subcortical white matter change otherwise is predominantly in the anterior frontal lobes.  The ventricles are of normal size. Flow is present in the major intracranial arteries. 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 No other significant extra-axial fluid collections are present.  The globes and orbits are intact. Chronic right maxillary sinus disease is present. There is a polyp or mucous retention cyst within the left sphenoid sinus. The paranasal sinuses and mastoid air cells are otherwise clear. The skullbase is unremarkable. The intracranial midline structures are within normal limits. 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  IMPRESSION: 1. 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 2. Remote medial left occipital lobe infarct. 3. Atrophy and minimal white matter changes otherwise within normal limits for age. 4. Chronic right maxillary sinus disease.   Electronically Signed   By: San Morelle M.D.   On: 06/20/2014 07:35   Dg Chest Port 1 View  06/19/2014   CLINICAL DATA:  74 year old female with altered mental status, found down this morning. Recent hospitalization for gastroenteritis. Initial encounter.  EXAM: PORTABLE CHEST - 1 VIEW  COMPARISON:  CT Abdomen and Pelvis 05/16/2014, and earlier.  FINDINGS: Portable AP semi upright view at 1241 hours. Lower lung volumes. Normal cardiac size and mediastinal contours. Allowing for portable technique, the lungs are clear. No pneumothorax or pleural effusion identified.  IMPRESSION: No acute cardiopulmonary abnormality.   Electronically Signed   By: Genevie Ann M.D.   On: 06/19/2014 13:25   Dg Fluoro Guide Lumbar Puncture  06/20/2014   CLINICAL DATA:  Meningitis  EXAM: DIAGNOSTIC LUMBAR PUNCTURE UNDER  FLUOROSCOPIC GUIDANCE  FLUOROSCOPY TIME:  Radiation Exposure Index (as provided by the fluoroscopic device): 15.3 mGy entrance does  If the device does not provide the exposure index:  Fluoroscopy Time (in minutes and seconds):  0.8 min  Number of Acquired Images:  3  PROCEDURE: Informed consent was obtained from the patient's granddaughter and power of attorney prior to the procedure. With the patient prone, the lower  back was prepped with Betadine. 1% Lidocaine was used for local anesthesia. Lumbar puncture was performed at the L3-4 level using a 20 gauge needle with return of clear CSF. 8 ml of CSF were obtained for laboratory studies. The patient tolerated the procedure well and there were no apparent complications.  IMPRESSION: Successful lumbar puncture with fluoroscopy.   Electronically Signed   By: Monte Fantasia M.D.   On: 06/20/2014 11:46    Scheduled Meds: . acyclovir  600 mg Intravenous Q12H  . ampicillin (OMNIPEN) IV  2 g Intravenous Q6H  . aspirin  300 mg Rectal Daily  . cefTRIAXone (ROCEPHIN)  IV  2 g Intravenous Q12H  . famotidine (PEPCID) IV  20 mg Intravenous Q24H  . heparin  5,000 Units Subcutaneous 3 times per day  . hydrALAZINE  5 mg Intravenous Q6H  . insulin aspart  0-15 Units Subcutaneous 6 times per day  . levETIRAcetam  500 mg Intravenous Q12H  . nicotine  21 mg Transdermal Daily  . sodium chloride  3 mL Intravenous Q12H  . vancomycin  750 mg Intravenous Q12H   Continuous Infusions: . 0.9 % NaCl with KCl 40 mEq / L 75 mL/hr (06/20/14 1827)    Principal Problem:   Sepsis Active Problems:   Crohn disease   Pelvic mass   Back pain, chronic   DM (diabetes mellitus)   Acute encephalopathy   UTI (lower urinary tract infection)   Tobacco abuse   CVA (cerebral infarction)    Time spent: 25 min    [facility]  [facility] Pager [phone]*. If 7PM-7AM, please contact night-coverage at [email or web address], password [facility] 06/21/2014, 11:03 AM  LOS: 2 days

## 2014-06-21 NOTE — Progress Notes (Signed)
VASCULAR LAB PRELIMINARY  PRELIMINARY  PRELIMINARY  PRELIMINARY  Left carotid Doppler completed.    Preliminary report:  There is 1-39% left ICA stenosis.  Vertebral artery flow is antegrade.  Unable to visualize right CCA and ICA secondary to patient's inability to keep head turned.   Brown Dunlap, RVT 06/21/2014, 6:26 PM

## 2014-06-22 DIAGNOSIS — A419 Sepsis, unspecified organism: Principal | ICD-10-CM

## 2014-06-22 DIAGNOSIS — Z720 Tobacco use: Secondary | ICD-10-CM

## 2014-06-22 DIAGNOSIS — K5090 Crohn's disease, unspecified, without complications: Secondary | ICD-10-CM

## 2014-06-22 DIAGNOSIS — R1900 Intra-abdominal and pelvic swelling, mass and lump, unspecified site: Secondary | ICD-10-CM

## 2014-06-22 DIAGNOSIS — E119 Type 2 diabetes mellitus without complications: Secondary | ICD-10-CM

## 2014-06-22 DIAGNOSIS — M549 Dorsalgia, unspecified: Secondary | ICD-10-CM

## 2014-06-22 DIAGNOSIS — I639 Cerebral infarction, unspecified: Secondary | ICD-10-CM

## 2014-06-22 LAB — BASIC METABOLIC PANEL
Anion gap: 7 (ref 5–15)
BUN: <5 mg/dL — ABNORMAL LOW (ref 6–23)
CO2: 22 mmol/L (ref 19–32)
CREATININE: 0.9 mg/dL (ref 0.50–1.10)
Calcium: 9.1 mg/dL (ref 8.4–10.5)
Chloride: 108 mmol/L (ref 96–112)
GFR, EST AFRICAN AMERICAN: 72 mL/min — AB (ref >90)
GFR, EST NON AFRICAN AMERICAN: 62 mL/min — AB (ref >90)
Glucose, Bld: 140 mg/dL — ABNORMAL HIGH (ref 70–99)
Potassium: 3.2 mmol/L — ABNORMAL LOW (ref 3.5–5.1)
Sodium: 137 mmol/L (ref 135–145)

## 2014-06-22 LAB — GLUCOSE, CAPILLARY
Glucose-Capillary: 113 mg/dL — ABNORMAL HIGH (ref 70–99)
Glucose-Capillary: 126 mg/dL — ABNORMAL HIGH (ref 70–99)
Glucose-Capillary: 128 mg/dL — ABNORMAL HIGH (ref 70–99)
Glucose-Capillary: 129 mg/dL — ABNORMAL HIGH (ref 70–99)
Glucose-Capillary: 134 mg/dL — ABNORMAL HIGH (ref 70–99)
Glucose-Capillary: 157 mg/dL — ABNORMAL HIGH (ref 70–99)
Glucose-Capillary: 161 mg/dL — ABNORMAL HIGH (ref 70–99)

## 2014-06-22 MED ORDER — SIMVASTATIN 40 MG PO TABS
40.0000 mg | ORAL_TABLET | Freq: Every day | ORAL | Status: DC
  Administered 2014-06-22 – 2014-06-26 (×5): 40 mg via ORAL
  Filled 2014-06-22 (×5): qty 1

## 2014-06-22 MED ORDER — POTASSIUM CHLORIDE CRYS ER 20 MEQ PO TBCR
40.0000 meq | EXTENDED_RELEASE_TABLET | ORAL | Status: AC
  Administered 2014-06-22 (×2): 40 meq via ORAL
  Filled 2014-06-22 (×2): qty 2

## 2014-06-22 NOTE — Progress Notes (Signed)
Dr. Darrick Meigs called me about pt's Keppra. It may be making the her lethargic and confused. No clear indication that patient is having seizures. EEG consistent with stroke but not seizure activity. Discussed with Dr Jaynee Eagles. OK to discontinue Keppra.  Mikey Bussing PA-C [facility] Pager [phone] 06/22/2014, 2:57 PM

## 2014-06-22 NOTE — Progress Notes (Signed)
Stroke team will sign off at this time. Please call if we can be of further service. Follow-up with Dr. Leonie Man in 2 months. Mikey Bussing PA-C [facility] Pager [phone] 06/22/2014, 10:33 AM

## 2014-06-22 NOTE — Progress Notes (Signed)
[facility] PROGRESS NOTE  Michelle Farrell VEH:[id number] DOB: [birth year] DOA: 06/19/2014 PCP: Hermelinda Medicus, MD  Assessment/Plan: 1. Severe sepsis- patient came with severe sepsis with temperature 102.5 leukocytosis, lactic acidosis and tachypnea with respirations in the 30s. Likely the source is UTI, CSF analysis does not show any infectious etiology. Patient was started on vancomycin, ampicillin and Rocephin for possible bacterial meningitis. Neurology is following, will narrow down antibiotics once more data is available. WBC down to 11.2 2. Acute encephalopathy- resolving, likely from UTI, MRI showed acute stroke in posterior medial thalamus. Neurology following. ID consulted for possible meningitis, and ID does not feel that she has bacterial meningitis , considering the few wbc in csf and normal protein. ID has discontinued the antibiotics. She also was started on Keppra per neuro, no seizures , EEG showed diffuse slowing with no epileptiform discharges. Called and discussed with Neuro PA, and he says ok to d/c Keppra, he will discuss with neurologist. 3. 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 Nothing by mouth for now until more alert to proceed with speech therapy. 2-D echo showed EF 60-65%, no thrombus noted in the echo. Carotid duplex is pending. 4. Hypokalemia- potassium is 3.2 today. IV fluids  normal saline with 20 of KCl at 75 per hour. Will give oral potassium. Will check BMP in a.m. 5. UTI- patient came with abnormal UA. Urine cultures are growing Escherichia coli. In the new Rocephin 6. Hypertension- patient currently on hydralazine. BP is now elevated Will restart Catapres 0.1 twice a day, rest of the medications are on hold for permissive hypertension. 7. Diabetes mellitus- patient is currently on sliding-scale insulin. Blood glucose is well controlled. 8. Acute kidney injury- resolved after  IV fluids. Creatinine 0.85 today.  Code Status: Full  code Family Communication: *Discussed with patient's granddaughter at bedside Disposition Plan: *To be decided   Consultants:  None  Procedures:  None  Antibiotics:  Rocephin 3/24  Vancomycin 3/24  Ampicillin 3/24  HPI/Subjective: *74 y.o. female, with PMH of heart failure (unspecified), diabetes mellitus, chronic hip, back, and leg pain, hypertension, and well-controlled Crohn's disease, 2 recent hospitalizations in the last 4.5 weeks. She was discharged on February 21 after suffering with nausea, vomiting, diarrhea, and acute renal failure and was discharged again on March 21 after having same symptoms. On admission, pt was lethargic and unable to provide any history. Granddaughter provided history and reports malodorous urine and low grade fevers, also reports that pt has been complaining of abd pain. Granddaughter also reported poor oral intake and mostly non productive cough. She found her on the floor when she went to check up on her at 10 am morning of admission. Patient underwent lumbar puncture, CSF analysis is negative for infection.  Today patient is again lethargic, granddaughter at bedside  Objective: Filed Vitals:   06/22/14 1411  BP: 152/50  Pulse:   Temp:   Resp:    No intake or output data in the 24 hours ending 06/22/14 1438 Filed Weights   06/20/14 0500 06/21/14 0546 06/22/14 0400  Weight: 74.617 kg (164 lb 8 oz) 74.526 kg (164 lb 4.8 oz) 72.576 kg (160 lb)    Exam:   General:  Lethargic though arousable and able to answer questions.  Cardiovascular: S1-S2 is regular  Respiratory: Clear to auscultation bilaterally  Abdomen: Soft, nontender no organomegaly  Musculoskeletal: No edema of the lower extremities.   Data Reviewed: Basic Metabolic Panel:  Recent Labs Lab 06/19/14 1234 06/20/14 0520 06/21/14 0550 06/22/14  0442  NA 139 137 139 137  K 3.9 2.8* 3.4* 3.2*  CL 102 107 107 108  CO2 25 22 26 22   GLUCOSE 201* 129* 114* 140*  BUN 8  6 <5* <5*  CREATININE 1.21* 0.98 0.85 0.90  CALCIUM 10.3 9.1 9.3 9.1  MG  --   --  1.5  --    Liver Function Tests:  Recent Labs Lab 06/19/14 1234  AST 32  ALT 18  ALKPHOS 65  BILITOT 0.5  PROT 7.0  ALBUMIN 3.8   No results for input(s): LIPASE, AMYLASE in the last 168 hours. No results for input(s): AMMONIA in the last 168 hours. CBC:  Recent Labs Lab 06/19/14 1234 06/20/14 0520 06/21/14 0550  WBC 16.1* 15.2* 11.2*  NEUTROABS 13.5*  --   --   HGB 12.5 11.2* 9.8*  HCT 35.8* 31.9* 28.5*  MCV 88.0 86.7 86.9  PLT 204 180 167   Cardiac Enzymes:  Recent Labs Lab 06/19/14 2023  CKTOTAL 624*   BNP (last 3 results) No results for input(s): BNP in the last 8760 hours.  ProBNP (last 3 results)  Recent Labs  08/27/13 2205 08/28/13 0454  PROBNP 263.1* 251.0*    CBG:  Recent Labs Lab 06/21/14 1955 06/21/14 2357 06/22/14 0433 06/22/14 0811 06/22/14 1135  GLUCAP 112* 129* 128* 134* 126*    Recent Results (from the past 240 hour(s))  Blood Culture (routine x 2)     Status: None (Preliminary result)   Collection Time: 06/19/14 12:23 PM  Result Value Ref Range Status   Specimen Description BLOOD LEFT ANTECUBITAL  Final   Special Requests BOTTLES DRAWN AEROBIC AND ANAEROBIC 3CC  Final   Culture   Final           BLOOD CULTURE RECEIVED NO GROWTH TO DATE CULTURE WILL BE HELD FOR 5 DAYS BEFORE ISSUING A FINAL NEGATIVE REPORT Performed at [facility]    Report Status PENDING  Incomplete  Blood Culture (routine x 2)     Status: None (Preliminary result)   Collection Time: 06/19/14 12:36 PM  Result Value Ref Range Status   Specimen Description BLOOD LEFT ANTECUBITAL  Final   Special Requests BOTTLES DRAWN AEROBIC AND ANAEROBIC 5ML  Final   Culture   Final           BLOOD CULTURE RECEIVED NO GROWTH TO DATE CULTURE WILL BE HELD FOR 5 DAYS BEFORE ISSUING A FINAL NEGATIVE REPORT Performed at [facility]    Report Status PENDING  Incomplete   Urine culture     Status: None   Collection Time: 06/19/14  2:32 PM  Result Value Ref Range Status   Specimen Description URINE, CLEAN CATCH  Final   Special Requests NONE  Final   Colony Count   Final    80,000 COLONIES/ML Performed at [facility]    Culture   Final    ESCHERICHIA COLI Performed at [facility]    Report Status 06/21/2014 FINAL  Final   Organism ID, Bacteria ESCHERICHIA COLI  Final      Susceptibility   Escherichia coli - MIC*    AMPICILLIN >=32 RESISTANT Resistant     CEFAZOLIN >=64 RESISTANT Resistant     CEFTRIAXONE <=1 SENSITIVE Sensitive     CIPROFLOXACIN >=4 RESISTANT Resistant     GENTAMICIN <=1 SENSITIVE Sensitive     LEVOFLOXACIN >=8 RESISTANT Resistant     NITROFURANTOIN <=16 SENSITIVE Sensitive     TOBRAMYCIN <=1 SENSITIVE  Sensitive     TRIMETH/SULFA >=320 RESISTANT Resistant     PIP/TAZO >=128 RESISTANT Resistant     * ESCHERICHIA COLI  MRSA PCR Screening     Status: None   Collection Time: 06/19/14  8:30 PM  Result Value Ref Range Status   MRSA by PCR NEGATIVE NEGATIVE Final    Comment:        The GeneXpert MRSA Assay (FDA approved for NASAL specimens only), is one component of a comprehensive MRSA colonization surveillance program. It is not intended to diagnose MRSA infection nor to guide or monitor treatment for MRSA infections.   CSF culture     Status: None (Preliminary result)   Collection Time: 06/20/14 11:33 AM  Result Value Ref Range Status   Specimen Description CSF  Final   Special Requests NONE  Final   Gram Stain   Final    FEW WBC PRESENT, PREDOMINANTLY PMN NO ORGANISMS SEEN Gram Stain Report Called to,Read Back By and Verified With: Gram Stain Report Called to,Read Back By and Verified With: ALLISON DUNDON [id number]@1 :00AM BJENN Performed at [facility]   Final    NO GROWTH 1 DAY Performed at [facility]    Report Status PENDING  Incomplete  Gram stain     Status: None    Collection Time: 06/20/14 11:33 AM  Result Value Ref Range Status   Specimen Description CSF  Final   Special Requests NONE  Final   Gram Stain   Final    CYTOSPIN PREP WBC PRESENT, PREDOMINANTLY PMN NO ORGANISMS SEEN    Report Status 06/20/2014 FINAL  Final     Studies: Mr Virgel Paling Wo Contrast  06/21/2014   CLINICAL DATA:  75 year old female with altered mental status, found down. MRI reveals dorsal left thalamus signal abnormality suspicious for infarct. Initial encounter.  EXAM: MRA HEAD WITHOUT CONTRAST  TECHNIQUE: Angiographic images of the Circle of Willis were obtained using MRA technique without intravenous contrast.  COMPARISON:  Brain MRI 06/20/2014 and earlier.  FINDINGS: Study is mildly degraded by motion artifact despite repeated imaging attempts.  Antegrade flow in both distal vertebral arteries in the posterior circulation. Both PICA origins appear patent. Patent vertebrobasilar junction. No evidence of basilar artery stenosis.  Both PCA origins are patent. There is mild to moderate irregularity of the left PCA P1 segment in the region of the thalamostriate artery origin. Otherwise bilateral PCA branches are within normal limits.  Posterior communicating arteries are diminutive or absent.  Antegrade flow in both ICA siphons. No evidence of siphon stenosis. Ophthalmic artery origins are within normal limits. Carotid termini are patent. MCA and ACA origins are patent.  Visualized bilateral MCA and ACA branches are degraded by motion, with no major branch occlusion identified.  IMPRESSION: 1. Mildly motion degraded despite repeated imaging attempts. 2. Left PCA P1 segment irregularity in the region of the left thalamostriate artery origin likely corresponds to the acute left thalamus finding. 3. No other abnormality is evident on this intracranial MRA.   Electronically Signed   By: Genevie Ann M.D.   On: 06/21/2014 10:01    Scheduled Meds: . aspirin EC  325 mg Oral Daily  . cloNIDine   0.1 mg Oral BID  . famotidine (PEPCID) IV  20 mg Intravenous Q24H  . heparin  5,000 Units Subcutaneous 3 times per day  . hydrALAZINE  5 mg Intravenous Q6H  . insulin aspart  0-15 Units Subcutaneous 6 times per day  .  nicotine  21 mg Transdermal Daily  . simvastatin  40 mg Oral q1800  . sodium chloride  3 mL Intravenous Q12H   Continuous Infusions: . sodium chloride 0.9 % 1,000 mL with potassium chloride 20 mEq infusion 75 mL/hr at 06/21/14 1244    Principal Problem:   Sepsis Active Problems:   Crohn disease   Pelvic mass   Back pain, chronic   DM (diabetes mellitus)   Acute encephalopathy   UTI (lower urinary tract infection)   Tobacco abuse   CVA (cerebral infarction)    Time spent: 25 min    [facility]  [facility] Pager [phone]*. If 7PM-7AM, please contact night-coverage at [email or web address], password [facility] 06/22/2014, 2:38 PM  LOS: 3 days

## 2014-06-22 NOTE — Consult Note (Signed)
[facility]     Reason for Consult: ? meningitis    Referring Physician: Dr. Darrick Meigs  Principal Problem:   Sepsis Active Problems:   Crohn disease   Pelvic mass   Back pain, chronic   DM (diabetes mellitus)   Acute encephalopathy   UTI (lower urinary tract infection)   Tobacco abuse   CVA (cerebral infarction)   . aspirin EC  325 mg Oral Daily  . cloNIDine  0.1 mg Oral BID  . famotidine (PEPCID) IV  20 mg Intravenous Q24H  . heparin  5,000 Units Subcutaneous 3 times per day  . hydrALAZINE  5 mg Intravenous Q6H  . insulin aspart  0-15 Units Subcutaneous 6 times per day  . levETIRAcetam  500 mg Intravenous Q12H  . nicotine  21 mg Transdermal Daily  . simvastatin  40 mg Oral q1800  . sodium chloride  3 mL Intravenous Q12H    Recommendations: Stop antibiotics and observe D/c droplet isolation  Does she need Keppra?  This was started empirically and can be sedating  If she again has a fever, would retest UA Minimize sedating meds  Assessment: She has a mixed picture after presenting with fever, mildly increased lactate, increased WBC and AMS after N/V episode.  UTI is less likely with very few WBCs in UA and less than 100,000 colony growth, meningitis possible but just a few WBCs, essentially normal protein (likely some increase with RBCs) and normal glucose which is much less likely bacterial meningitis, though could be viral meningitis or encephalitis.  HSV is negative though so no indication for treatment.   Also is on Keppra, received 2 mg of Ativan on 3/24. Admitted for sepsis but was hypertensive, not tachycardic and has been requiring BP meds.    Antibiotics: Vancomycin/ceftiraxone/acyclovir/ampicillin  HPI: Michelle Farrell is a 74 y.o. female with DM, chronic pain, hypertension, Crohn's disease who initially presented to the ED 3/17 with diarrhea and felt to have gastroenteritis. She was hospitalized then with acute renal insufficiency felt  due to dehydration.   She came back 3/24 significantly altered after being found by granddaughter unresponsive.  She was febrile and UA with just 3-6 WBCs but started on vancomycin and zosyn from sepsis protocol due to elevated lactate.  Urine culture grew 80,000 colonies E coli but AMS persisted.  Seen by neurology and with fever, AMS and some 'stiffness' in neck and worked up for meningitis/encephalitis.  Neurology was unable to get LP and she was started on empiric vancom/ceftriaxone/ampicillin/acyclovir.  LP done then by IR on day 2 of hospitalization and noted 28 WBCs with 79% neutrophils, mildly elevated glucose consistent with serum, and mildly elevated protein to 85 with some RBCs in sample.  CSF and blood cultures have remained negative.  HSV negative.  MRI with acute infarct, no evidence of encephalitis.     Review of Systems: Review of systems not obtained due to patient factors.  Past Medical History  Diagnosis Date  . CHF (congestive heart failure)   . Hypertension   . Back pain, chronic   . Hip pain, chronic   . GERD (gastroesophageal reflux disease)   . Crohn disease   . Diabetes mellitus without complication     History  Substance Use Topics  . Smoking status: Current Every Day Smoker  . Smokeless tobacco: Never Used  . Alcohol Use: No    Family History  Problem Relation Age of Onset  . Diabetes Mellitus II Other  No Known Allergies  OBJECTIVE: Blood pressure 130/58, pulse 73, temperature 98.1 F (36.7 C), temperature source Axillary, resp. rate 18, height 5' 6"  (1.676 m), weight 160 lb (72.576 kg), SpO2 100 %. General: she is awake, does respond to commands, sleepy Skin: no rashes Lungs: CTA B Cor: RRR Abdomen: soft, nt, nd Ext: no edema Neuro: no meningismus   Microbiology: Recent Results (from the past 240 hour(s))  Blood Culture (routine x 2)     Status: None (Preliminary result)   Collection Time: 06/19/14 12:23 PM  Result Value Ref Range Status    Specimen Description BLOOD LEFT ANTECUBITAL  Final   Special Requests BOTTLES DRAWN AEROBIC AND ANAEROBIC 3CC  Final   Culture   Final           BLOOD CULTURE RECEIVED NO GROWTH TO DATE CULTURE WILL BE HELD FOR 5 DAYS BEFORE ISSUING A FINAL NEGATIVE REPORT Performed at [facility]    Report Status PENDING  Incomplete  Blood Culture (routine x 2)     Status: None (Preliminary result)   Collection Time: 06/19/14 12:36 PM  Result Value Ref Range Status   Specimen Description BLOOD LEFT ANTECUBITAL  Final   Special Requests BOTTLES DRAWN AEROBIC AND ANAEROBIC 5ML  Final   Culture   Final           BLOOD CULTURE RECEIVED NO GROWTH TO DATE CULTURE WILL BE HELD FOR 5 DAYS BEFORE ISSUING A FINAL NEGATIVE REPORT Performed at [facility]    Report Status PENDING  Incomplete  Urine culture     Status: None   Collection Time: 06/19/14  2:32 PM  Result Value Ref Range Status   Specimen Description URINE, CLEAN CATCH  Final   Special Requests NONE  Final   Colony Count   Final    80,000 COLONIES/ML Performed at [facility]    Culture   Final    ESCHERICHIA COLI Performed at [facility]    Report Status 06/21/2014 FINAL  Final   Organism ID, Bacteria ESCHERICHIA COLI  Final      Susceptibility   Escherichia coli - MIC*    AMPICILLIN >=32 RESISTANT Resistant     CEFAZOLIN >=64 RESISTANT Resistant     CEFTRIAXONE <=1 SENSITIVE Sensitive     CIPROFLOXACIN >=4 RESISTANT Resistant     GENTAMICIN <=1 SENSITIVE Sensitive     LEVOFLOXACIN >=8 RESISTANT Resistant     NITROFURANTOIN <=16 SENSITIVE Sensitive     TOBRAMYCIN <=1 SENSITIVE Sensitive     TRIMETH/SULFA >=320 RESISTANT Resistant     PIP/TAZO >=128 RESISTANT Resistant     * ESCHERICHIA COLI  MRSA PCR Screening     Status: None   Collection Time: 06/19/14  8:30 PM  Result Value Ref Range Status   MRSA by PCR NEGATIVE NEGATIVE Final    Comment:        The GeneXpert MRSA Assay (FDA approved for  NASAL specimens only), is one component of a comprehensive MRSA colonization surveillance program. It is not intended to diagnose MRSA infection nor to guide or monitor treatment for MRSA infections.   CSF culture     Status: None (Preliminary result)   Collection Time: 06/20/14 11:33 AM  Result Value Ref Range Status   Specimen Description CSF  Final   Special Requests NONE  Final   Gram Stain   Final    FEW WBC PRESENT, PREDOMINANTLY PMN NO ORGANISMS SEEN Gram Stain Report Called to,Read Back By  and Verified With: Gram Stain Report Called to,Read Back By and Verified With: ALLISON DUNDON [id number]@1 :00AM BJENN Performed at [facility]   Final    NO GROWTH 1 DAY Performed at [facility]    Report Status PENDING  Incomplete  Gram stain     Status: None   Collection Time: 06/20/14 11:33 AM  Result Value Ref Range Status   Specimen Description CSF  Final   Special Requests NONE  Final   Gram Stain   Final    CYTOSPIN PREP WBC PRESENT, PREDOMINANTLY PMN NO ORGANISMS SEEN    Report Status 06/20/2014 FINAL  Final    Kolina Kube, Herbie [place], [facility] [email or web address] [phone] pager  [phone] cell 06/22/2014, 11:23 AM

## 2014-06-23 DIAGNOSIS — G8929 Other chronic pain: Secondary | ICD-10-CM

## 2014-06-23 LAB — BASIC METABOLIC PANEL
ANION GAP: 5 (ref 5–15)
BUN: <5 mg/dL — ABNORMAL LOW (ref 6–23)
CALCIUM: 8.5 mg/dL (ref 8.4–10.5)
CHLORIDE: 118 mmol/L — AB (ref 96–112)
CO2: 21 mmol/L (ref 19–32)
CREATININE: 0.93 mg/dL (ref 0.50–1.10)
GFR calc Af Amer: 69 mL/min — ABNORMAL LOW (ref >90)
GFR, EST NON AFRICAN AMERICAN: 60 mL/min — AB (ref >90)
Glucose, Bld: 116 mg/dL — ABNORMAL HIGH (ref 70–99)
Potassium: 4.6 mmol/L (ref 3.5–5.1)
Sodium: 144 mmol/L (ref 135–145)

## 2014-06-23 LAB — CBC
HCT: 23.5 % — ABNORMAL LOW (ref 36.0–46.0)
HEMOGLOBIN: 8.2 g/dL — AB (ref 12.0–15.0)
MCH: 30.5 pg (ref 26.0–34.0)
MCHC: 34.9 g/dL (ref 30.0–36.0)
MCV: 87.4 fL (ref 78.0–100.0)
Platelets: 151 10*3/uL (ref 150–400)
RBC: 2.69 MIL/uL — ABNORMAL LOW (ref 3.87–5.11)
RDW: 14.6 % (ref 11.5–15.5)
WBC: 8.2 10*3/uL (ref 4.0–10.5)

## 2014-06-23 LAB — PATHOLOGIST SMEAR REVIEW

## 2014-06-23 LAB — GLUCOSE, CAPILLARY
GLUCOSE-CAPILLARY: 137 mg/dL — AB (ref 70–99)
Glucose-Capillary: 113 mg/dL — ABNORMAL HIGH (ref 70–99)
Glucose-Capillary: 115 mg/dL — ABNORMAL HIGH (ref 70–99)
Glucose-Capillary: 121 mg/dL — ABNORMAL HIGH (ref 70–99)
Glucose-Capillary: 141 mg/dL — ABNORMAL HIGH (ref 70–99)

## 2014-06-23 MED ORDER — RESOURCE THICKENUP CLEAR PO POWD
ORAL | Status: DC | PRN
Start: 2014-06-23 — End: 2014-06-27
  Filled 2014-06-23: qty 125

## 2014-06-23 MED ORDER — FAMOTIDINE 20 MG PO TABS
20.0000 mg | ORAL_TABLET | Freq: Every day | ORAL | Status: DC
  Administered 2014-06-23 – 2014-06-26 (×4): 20 mg via ORAL
  Filled 2014-06-23 (×4): qty 1

## 2014-06-23 NOTE — Consult Note (Signed)
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 right handed female with history of Crohn's disease, diastolic congestive heart failure, hypertension, diabetes mellitus with peripheral neuropathy. Patient with recent admission 06/11/2014 -06/16/2014 for nausea vomiting, AKI related to Crohn's disease. Presented 06/19/2014 after being found on the floor by her granddaughter with altered mental status and aphasia. Cranial CT scan negative for acute changes. CT cervical spine negative. MRI of the brain showed acute/subacute 10 mm nonhemorrhagic infarct within the posterior medial thalamus as well as remote medial left occipital lobe infarct. Hospital workup with urine culture greater than 80,000 Escherichia coli, blood cultures negative. White blood cell count 16,100. Echocardiogram with ejection fraction of 79% grade 1 diastolic dysfunction. EEG showed moderately severe generalized nonspecific slowing without seizure. Carotid Dopplers with no left ICA stenosis. Unable to visualize right ICA secondary to patient's inability to keep head turned. Lumbar puncture negative. Patient did not receive TPA. Placed on aspirin for CVA prophylaxis. Subcutaneous heparin for DVT prophylaxis. Presently on a dysphagia 2 nectar liquid diet. Physical and occupational therapy evaluations completed. M.D. has requested physical medicine rehabilitation consult.  Review of Systems  Unable to perform ROS: language   Past Medical History  Diagnosis Date  . CHF (congestive heart failure)   . Hypertension   . Back pain, chronic   . Hip pain, chronic   . GERD (gastroesophageal reflux disease)   . Crohn disease   . Diabetes mellitus without complication    Past Surgical History  Procedure Laterality Date  . Abdominal hysterectomy    . Tonsillectomy    .  Cholecystectomy    . Bladder surgery     Family History  Problem Relation Age of Onset  . Diabetes Mellitus II Other    Social History:  reports that she has been smoking.  She has never used smokeless tobacco. She reports that she does not drink alcohol or use illicit drugs. Allergies: No Known Allergies Medications Prior to Admission  Medication Sig Dispense Refill  . Cholecalciferol (VITAMIN D) 2000 UNITS CAPS Take 1 capsule by mouth daily.    . cloNIDine (CATAPRES) 0.1 MG tablet Take 1 tablet (0.1 mg total) by mouth 2 (two) times daily. 60 tablet 11  . folic acid (FOLVITE) 1 MG tablet Take 1 mg by mouth daily.    Marland Kitchen gabapentin (NEURONTIN) 600 MG tablet Take 600 mg by mouth 3 (three) times daily.     . hydrALAZINE (APRESOLINE) 50 MG tablet Take 1 tablet (50 mg total) by mouth every 8 (eight) hours. 90 tablet 0  . HYDROcodone-acetaminophen (NORCO) 10-325 MG per tablet Take 1 tablet by mouth every 6 (six) hours as needed (for pain.).     Marland Kitchen lisinopril (PRINIVIL,ZESTRIL) 10 MG tablet Take 1 tablet (10 mg total) by mouth daily.    Marland Kitchen omeprazole (PRILOSEC) 20 MG capsule Take 20 mg by mouth every morning.     . ondansetron (ZOFRAN) 4 MG tablet Take 1 tablet (4 mg total) by mouth every 6 (six) hours as needed for nausea. 20 tablet 0  . potassium chloride SA (K-DUR,KLOR-CON) 20 MEQ tablet Take 20 mEq by mouth daily.    Marland Kitchen PROAIR HFA 108 (90 BASE) MCG/ACT inhaler Inhale 1 puff into the lungs every 6 (six) hours as needed. Shortness of breath or wheezing  3  . simvastatin (ZOCOR) 40 MG tablet Take 40 mg  by mouth at bedtime.      . sitaGLIPtin (JANUVIA) 100 MG tablet Take 50 mg by mouth every morning.     Marland Kitchen spironolactone (ALDACTONE) 25 MG tablet Take 25 mg by mouth every morning.     . sulfaSALAzine (AZULFIDINE) 500 MG tablet Take 500 mg by mouth 4 (four) times daily.    . traZODone (DESYREL) 100 MG tablet Take 200 mg by mouth at bedtime.   0    Home: Home Living Family/patient expects to be  discharged to:: Private residence Living Arrangements: Alone, Other relatives Available Help at Discharge: Family (could possibly work out Clinical cytogeneticist) Type of Home: Apartment Home Access: Stairs to enter Technical brewer of Steps: [place]: One level [place]: [place] - single point, Environmental consultant - 2 wheels  Lives With: Alone  Functional History: Prior Function Level of Independence: Independent Comments: has RW and cane but doesn't use per daughter Functional Status:  Mobility: Bed Mobility Overal bed mobility: Needs Assistance Bed Mobility: Supine to Sit, Sit to Supine Supine to sit: Min assist Sit to supine: Min assist General bed mobility comments: Decreased initiation. Assist to get pt started to lay down and then she was able to complete.  Transfers Overall transfer level: Needs assistance Equipment used: 1 person hand held assist Transfers: Sit to/from Stand, Stand Pivot Transfers Sit to Stand: Min assist, Min guard Stand pivot transfers: Min guard, Min assist General transfer comment: steadying due to equipment and lack of understandign of how to navigate her crowded environment Ambulation/Gait Ambulation/Gait assistance: Min assist, +2 safety/equipment (family to direct her focus) Ambulation Distance (Feet): 25 Feet Assistive device: 2 person hand held assist Gait Pattern/deviations: Step-through pattern, Decreased stride length, Shuffle, Ataxic Gait velocity: halting Gait velocity interpretation: Below normal speed for age/gender General Gait Details: shuffled ataxic and short steps    ADL: ADL Overall ADL's : Needs assistance/impaired Grooming: Maximal assistance, Wash/dry face, Applying deodorant, Sitting Grooming Details (indicate cue type and reason): Hand over hand to wash face. OT applied deodorant on one armpit and pt then able to apply it to other arm. Upper Body Bathing: Maximal assistance, Sitting Lower Body Bathing: Maximal assistance, Sit  to/from stand Upper Body Dressing : Maximal assistance, Sitting Lower Body Dressing: Maximal assistance, Sit to/from stand, Total assistance Toilet Transfer: Minimal assistance, Min guard, Stand-pivot, Ambulation (chair and bed) Functional mobility during ADLs: Min guard, Minimal assistance General ADL Comments: Pt having difficulty following commands.   Cognition: Cognition Overall Cognitive Status: Impaired/Different from baseline Arousal/Alertness: Lethargic Orientation Level: Other (comment) Attention: Sustained Sustained Attention: Impaired Sustained Attention Impairment: Verbal basic, Functional basic Memory: Impaired Awareness: Impaired Problem Solving: Impaired Problem Solving Impairment: Verbal basic, Functional basic Behaviors: Restless, Physical agitation, Verbal agitation, Impulsive Safety/Judgment: Impaired Cognition Arousal/Alertness: Awake/alert Behavior During Therapy: Impulsive Overall Cognitive Status: Impaired/Different from baseline Area of Impairment: Safety/judgement, Following commands, Attention, Problem solving Current Attention Level: Focused Memory: Decreased recall of precautions, Decreased short-term memory Following Commands: Follows one step commands inconsistently Safety/Judgement: Decreased awareness of safety Awareness: Anticipatory, Intellectual Problem Solving: Slow processing, Decreased initiation, Difficulty sequencing, Requires verbal cues, Requires tactile cues General Comments: Pt is trying to stand and continually scoots out farther on edge of bed  Blood pressure 165/64, pulse 67, temperature 98.8 F (37.1 C), temperature source Oral, resp. rate 14, height 5' 6"  (1.676 m), weight 72.575 kg (160 lb), SpO2 100 %. Physical Exam  HENT:  Head: Normocephalic.  Eyes: EOM are normal.  Negative nystagmus  Neck: Normal range of  motion. Neck supple. No thyromegaly present.  Cardiovascular: Normal rate and regular rhythm.   Respiratory: Effort  normal and breath sounds normal. No respiratory distress.  GI: Soft. Bowel sounds are normal. She exhibits no distension.  Musculoskeletal: She exhibits no edema.  Neurological: She is alert.  Makes eye contact with examiner. Patient is alert. Unable to consistently follow commands. When asked questions she perseverated on the answer: "uh-huh" using this expression for every question I asked until the end of our meeting. Moved all 4's with some preference of the right side over the left. Seemed to sense pain in the right more than the left.  Skin: Skin is warm and dry.  Psychiatric:  Confused, non-agitated    Results for orders placed or performed during the hospital encounter of 06/19/14 (from the past 24 hour(s))  Glucose, capillary     Status: Abnormal   Collection Time: 06/22/14  4:18 PM  Result Value Ref Range   Glucose-Capillary 157 (H) 70 - 99 mg/dL   Comment 1 Notify RN    Comment 2 Document in Chart   Glucose, capillary     Status: Abnormal   Collection Time: 06/22/14  8:16 PM  Result Value Ref Range   Glucose-Capillary 161 (H) 70 - 99 mg/dL  Glucose, capillary     Status: Abnormal   Collection Time: 06/22/14 11:47 PM  Result Value Ref Range   Glucose-Capillary 113 (H) 70 - 99 mg/dL  Basic metabolic panel     Status: Abnormal   Collection Time: 06/23/14  5:45 AM  Result Value Ref Range   Sodium 144 135 - 145 mmol/L   Potassium 4.6 3.5 - 5.1 mmol/L   Chloride 118 (H) 96 - 112 mmol/L   CO2 21 19 - 32 mmol/L   Glucose, Bld 116 (H) 70 - 99 mg/dL   BUN <5 (L) 6 - 23 mg/dL   Creatinine, Ser 0.93 0.50 - 1.10 mg/dL   Calcium 8.5 8.4 - 10.5 mg/dL   GFR calc non Af Amer 60 (L) >90 mL/min   GFR calc Af Amer 69 (L) >90 mL/min   Anion gap 5 5 - 15  CBC     Status: Abnormal   Collection Time: 06/23/14  5:45 AM  Result Value Ref Range   WBC 8.2 4.0 - 10.5 K/uL   RBC 2.69 (L) 3.87 - 5.11 MIL/uL   Hemoglobin 8.2 (L) 12.0 - 15.0 g/dL   HCT 23.5 (L) 36.0 - 46.0 %   MCV 87.4 78.0 -  100.0 fL   MCH 30.5 26.0 - 34.0 pg   MCHC 34.9 30.0 - 36.0 g/dL   RDW 14.6 11.5 - 15.5 %   Platelets 151 150 - 400 K/uL  Glucose, capillary     Status: Abnormal   Collection Time: 06/23/14  7:29 AM  Result Value Ref Range   Glucose-Capillary 113 (H) 70 - 99 mg/dL  Glucose, capillary     Status: Abnormal   Collection Time: 06/23/14 11:45 AM  Result Value Ref Range   Glucose-Capillary 137 (H) 70 - 99 mg/dL   No results found.  Assessment/Plan: Diagnosis: right thalamic infarct 1. Does the need for close, 24 hr/day medical supervision in concert with the patient's rehab needs make it unreasonable for this patient to be served in a less intensive setting? Yes and Potentially 2. Co-Morbidities requiring supervision/potential complications: dm, sepsis, encephalopathy 3. Due to bladder management, bowel management, safety, skin/wound care, disease management, medication administration, pain management and patient education, does  the patient require 24 hr/day rehab nursing? Yes 4. Does the patient require coordinated care of a physician, rehab nurse, PT (1-2 hrs/day, 5 days/week), OT (1-2 hrs/day, 5 days/week) and SLP (1-2 hrs/day, 5 days/week) to address physical and functional deficits in the context of the above medical diagnosis(es)? Yes Addressing deficits in the following areas: balance, endurance, locomotion, strength, transferring, bowel/bladder control, bathing, dressing, feeding, grooming, toileting, cognition, language, swallowing and psychosocial support 5. Can the patient actively participate in an intensive therapy program of at least 3 hrs of therapy per day at least 5 days per week? Yes 6. The potential for patient to make measurable gains while on inpatient rehab is excellent 7. Anticipated functional outcomes upon discharge from inpatient rehab are supervision  with PT, supervision with OT, supervision and min assist with SLP. 8. Estimated rehab length of stay to reach the above  functional goals is: 10-18 days 9. Does the patient have adequate social supports and living environment to accommodate these discharge functional goals? Yes 10. Anticipated D/C setting: Home 11. Anticipated post D/C treatments: [place] therapy 12. Overall Rehab/Functional Prognosis: good  RECOMMENDATIONS: This patient's condition is appropriate for continued rehabilitative care in the following setting: CIR Patient has agreed to participate in recommended program. N/A Note that insurance prior authorization may be required for reimbursement for recommended care.  Comment: Rehab Admissions Coordinator to follow up.  Thanks,  Meredith Staggers, MD, Mellody Drown     06/23/2014

## 2014-06-23 NOTE — Evaluation (Signed)
Occupational Therapy Evaluation Patient Details Name: Michelle Farrell MRN: [id number] DOB: 1940/11/11 Today's Date: 06/23/2014    History of Present Illness 74 y.o. female with a history of Crohn's disease, Diastolic CHF, HTN, IDDM, and Hyperlipidemia, COPD, neuropathy, chronic back pain admitted with nausea, vomiting, diarrhea, and periods of unresponsiveness.  MRI showed left dorsal basal ganglia infarct.   Clinical Impression   Pt admitted with above. Pt independent with ADLs, PTA. Feel pt will benefit from acute OT to increase independence prior to d/c. Recommending CIR for rehab.    Follow Up Recommendations  CIR;Supervision/Assistance - 24 hour    Equipment Recommendations  Other (comment) (defer to next venue)    Recommendations for Other Services Rehab consult     Precautions / Restrictions Precautions Precautions: Fall Restrictions Weight Bearing Restrictions: No      Mobility Bed Mobility Overal bed mobility: Needs Assistance Bed Mobility: Supine to Sit;Sit to Supine     Supine to sit: Min assist Sit to supine: Min assist   General bed mobility comments: Decreased initiation. Assist to get pt started to lay down and then she was able to complete.   Transfers Overall transfer level: Needs assistance Transfers: Sit to/from Stand;Stand Pivot Transfers Sit to Stand: Min assist;Min guard Stand pivot transfers: Min guard;Min assist         Balance Min guard-Min assist for mobility.                                ADL Overall ADL's : Needs assistance/impaired     Grooming: Maximal assistance;Wash/dry face;Applying deodorant;Sitting Grooming Details (indicate cue type and reason): Hand over hand to wash face. OT applied deodorant on one armpit and pt then able to apply it to other arm. Upper Body Bathing: Maximal assistance;Sitting   Lower Body Bathing: Maximal assistance;Sit to/from stand   Upper Body Dressing : Maximal assistance;Sitting    Lower Body Dressing: Maximal assistance;Sit to/from stand;Total assistance   Toilet Transfer: Minimal assistance;Min guard;Stand-pivot;Ambulation (chair and bed)           Functional mobility during ADLs: Min guard;Minimal assistance General ADL Comments: Pt having difficulty following commands.      Vision     Perception     Praxis      Pertinent Vitals/Pain Pain Assessment: Faces Pain Score: 0-No pain Faces Pain Scale: No hurt     Hand Dominance Right   Extremity/Trunk Assessment Upper Extremity Assessment Upper Extremity Assessment: Generalized weakness;Difficult to assess due to impaired cognition   Lower Extremity Assessment Lower Extremity Assessment: Defer to PT evaluation       Communication Communication Communication: Expressive difficulties;Receptive difficulties   Cognition Arousal/Alertness: Awake/alert Behavior During Therapy: Impulsive Overall Cognitive Status: Impaired/Different from baseline Area of Impairment: Safety/judgement;Following commands;Attention;Problem solving   Current Attention Level: Focused Memory: Decreased recall of precautions;Decreased short-term memory Following Commands: Follows one step commands inconsistently Safety/Judgement: Decreased awareness of safety Problem Solving: Slow processing;Decreased initiation;Difficulty sequencing;Requires verbal cues;Requires tactile cues General Comments: Pt is trying to stand and continually scoots out farther on edge of bed   General Comments          Shoulder Instructions      Home Living Family/patient expects to be discharged to:: Private residence Living Arrangements: Alone;Other relatives Available Help at Discharge: Family (could possibly work out Clinical cytogeneticist) Type of Home: Apartment Home Access: Stairs to enter [employer] of Steps: 3   Home Layout: One level  Bathroom Shower/Tub: Teacher, early years/pre: Standard     Home Equipment:  Cane - single point;Walker - 2 wheels      Lives With: Alone    Prior Functioning/Environment Level of Independence: Independent        Comments: has RW and cane but doesn't use per daughter    OT Diagnosis: Generalized weakness;Cognitive deficits   OT Problem List: Decreased strength;Impaired balance (sitting and/or standing);Decreased knowledge of use of DME or AE;Decreased knowledge of precautions;Decreased safety awareness;Decreased cognition   OT Treatment/Interventions: Self-care/ADL training;Therapeutic exercise;DME and/or AE instruction;Therapeutic activities;Cognitive remediation/compensation;Patient/family education;Balance training    OT Goals(Current goals can be found in the care plan section) Acute Rehab OT Goals Patient Stated Goal: unable to state OT Goal Formulation: Patient unable to participate in goal setting Time For Goal Achievement: 07/07/14 Potential to Achieve Goals: Good  OT Frequency: Min 2X/week   Barriers to D/C:            Co-evaluation              End of Session Equipment Utilized During Treatment: Gait belt Nurse Communication: Other (comment);Mobility status (d/c recommendation; cognition impaired)  Activity Tolerance: Patient tolerated treatment well Patient left: in bed;with call bell/phone within reach;with bed alarm set;with family/visitor present   Time: 0913-0929 OT Time Calculation (min): 16 min Charges:  OT General Charges $OT Visit: 1 Procedure OT Evaluation $Initial OT Evaluation Tier I: 1 Procedure G-CodesBenito Mccreedy OTR/L [phone] 06/23/2014, 10:42 AM

## 2014-06-23 NOTE — Progress Notes (Signed)
Physical Therapy Treatment Patient Details Name: Michelle Farrell MRN: [id number] DOB: 08/08/1940 Today's Date: 06/23/2014    History of Present Illness 74 y.o. female with a history of Crohn's disease, Diastolic CHF, HTN, IDDM, and Hyperlipidemia, COPD, neuropathy, chronic back pain admitted with nausea, vomiting, diarrhea, and periods of unresponsiveness.  MRI showed left dorsal BG infarct.  Work up continues    PT Comments    Pt's granddaughter was present and wants to have her grandmother considered for CIR.  Pt is demonstrating some better willingness to work with PT and should at least be considered, esp with recent falls at home and mult hospitalizations.  Will continue therapy as ordered.  Follow Up Recommendations  CIR;Supervision/Assistance - 24 hour     Equipment Recommendations  None recommended by PT    Recommendations for Other Services Rehab consult     Precautions / Restrictions Precautions Precautions: Fall Restrictions Weight Bearing Restrictions: No    Mobility  Bed Mobility Overal bed mobility: Needs Assistance Bed Mobility: Supine to Sit     Supine to sit: Supervision;Min guard     General bed mobility comments: Pt was interested in getting up to BR and was trying to stand due to urge incontinence  Transfers Overall transfer level: Needs assistance Equipment used: 1 person hand held assist Transfers: Sit to/from Stand;Stand Pivot Transfers Sit to Stand: Min guard;Min assist Stand pivot transfers: Min guard;Min assist       General transfer comment: steadying due to equipment and lack of understandign of how to navigate her crowded environment  Ambulation/Gait Ambulation/Gait assistance: Min assist;+2 safety/equipment (family to direct her focus) Ambulation Distance (Feet): 25 Feet Assistive device: 2 person hand held assist Gait Pattern/deviations: Step-through pattern;Decreased stride length;Shuffle;Ataxic Gait velocity: halting Gait  velocity interpretation: Below normal speed for age/gender General Gait Details: shuffled ataxic and short steps   Stairs            Wheelchair Mobility    Modified Rankin (Stroke Patients Only) Modified Rankin (Stroke Patients Only) Pre-Morbid Rankin Score: No symptoms Modified Rankin: Moderately severe disability     Balance Overall balance assessment: History of Falls Sitting-balance support: Feet supported Sitting balance-Leahy Scale: Fair                              Cognition Arousal/Alertness: Lethargic Behavior During Therapy: Impulsive;Flat affect Overall Cognitive Status: Impaired/Different from baseline Area of Impairment: Safety/judgement;Following commands;Awareness     Memory: Decreased recall of precautions;Decreased short-term memory Following Commands: Follows one step commands inconsistently Safety/Judgement: Decreased awareness of safety;Decreased awareness of deficits Awareness: Anticipatory;Intellectual   General Comments: Pt is trying to stand and continually scoots out farther on edge of bed    Exercises General Exercises - Lower Extremity Ankle Circles/Pumps: AAROM;Both;10 reps Long Arc Quad: Strengthening;Both;10 reps Heel Slides: Strengthening;Both;10 reps    General Comments General comments (skin integrity, edema, etc.): Pt is impulsively trying to get up and walk and scoot out on eob, but also not attending to instructions to scoot back on bed.      Pertinent Vitals/Pain Pain Assessment: Faces Pain Score: 0-No pain Faces Pain Scale: No hurt    Home Living                      Prior Function            PT Goals (current goals can now be found in the care plan section) Acute Rehab PT  Goals Patient Stated Goal: none stated Progress towards PT goals: Progressing toward goals    Frequency  Min 3X/week    PT Plan Discharge plan needs to be updated    Co-evaluation             End of Session  Equipment Utilized During Treatment: Gait belt Activity Tolerance: Patient limited by lethargy Patient left: in chair;with call bell/phone within reach;with family/visitor present     Time: 7741-4239 PT Time Calculation (min) (ACUTE ONLY): 25 min  Charges:  $Gait Training: 8-22 mins $Therapeutic Exercise: 8-22 mins                    G Codes:      Ramond Dial 06-27-2014, 10:28 AM  Mee Hives, PT MS Acute Rehab Dept. Number: [phone]

## 2014-06-23 NOTE — Progress Notes (Signed)
Patient ID: Michelle Farrell, female   DOB: [birth year], 74 y.o.   MRN: [id number]         [facility]    Date of Admission:  06/19/2014   Off antibiotics x 24 hours  Principal Problem:   Sepsis Active Problems:   Crohn disease   Pelvic mass   Back pain, chronic   DM (diabetes mellitus)   Acute encephalopathy   UTI (lower urinary tract infection)   Tobacco abuse   CVA (cerebral infarction)   . aspirin EC  325 mg Oral Daily  . cloNIDine  0.1 mg Oral BID  . famotidine  20 mg Oral QHS  . heparin  5,000 Units Subcutaneous 3 times per day  . hydrALAZINE  5 mg Intravenous Q6H  . insulin aspart  0-15 Units Subcutaneous 6 times per day  . nicotine  21 mg Transdermal Daily  . simvastatin  40 mg Oral q1800  . sodium chloride  3 mL Intravenous Q12H    Subjective: She denies headache.  Review of Systems: Pertinent items are noted in HPI.  Past Medical History  Diagnosis Date  . CHF (congestive heart failure)   . Hypertension   . Back pain, chronic   . Hip pain, chronic   . GERD (gastroesophageal reflux disease)   . Crohn disease   . Diabetes mellitus without complication     History  Substance Use Topics  . Smoking status: Current Every Day Smoker  . Smokeless tobacco: Never Used  . Alcohol Use: No    Family History  Problem Relation Age of Onset  . Diabetes Mellitus II Other    No Known Allergies  OBJECTIVE: Blood pressure 158/64, pulse 67, temperature 98.8 F (37.1 C), temperature source Oral, resp. rate 14, height 5' 6"  (1.676 m), weight 160 lb (72.575 kg), SpO2 100 %. General: she is alert and in no distress. She answers questions appropriately with one-word answers Skin: no rash Neck: Supple Lungs: clear Cor:  Regular S1 and S2 with no murmurs Abdomen: soft and nontender  Lab Results Lab Results  Component Value Date   WBC 8.2 06/23/2014   HGB 8.2* 06/23/2014   HCT 23.5* 06/23/2014   MCV 87.4 06/23/2014   PLT 151  06/23/2014    Lab Results  Component Value Date   CREATININE 0.93 06/23/2014   BUN <5* 06/23/2014   NA 144 06/23/2014   K 4.6 06/23/2014   CL 118* 06/23/2014   CO2 21 06/23/2014    Lab Results  Component Value Date   ALT 18 06/19/2014   AST 32 06/19/2014   ALKPHOS 65 06/19/2014   BILITOT 0.5 06/19/2014     Microbiology: Recent Results (from the past 240 hour(s))  Blood Culture (routine x 2)     Status: None (Preliminary result)   Collection Time: 06/19/14 12:23 PM  Result Value Ref Range Status   Specimen Description BLOOD LEFT ANTECUBITAL  Final   Special Requests BOTTLES DRAWN AEROBIC AND ANAEROBIC 3CC  Final   Culture   Final           BLOOD CULTURE RECEIVED NO GROWTH TO DATE CULTURE WILL BE HELD FOR 5 DAYS BEFORE ISSUING A FINAL NEGATIVE REPORT Performed at [facility]    Report Status PENDING  Incomplete  Blood Culture (routine x 2)     Status: None (Preliminary result)   Collection Time: 06/19/14 12:36 PM  Result Value Ref Range Status   Specimen Description BLOOD LEFT ANTECUBITAL  Final   Special Requests BOTTLES DRAWN AEROBIC AND ANAEROBIC 5ML  Final   Culture   Final           BLOOD CULTURE RECEIVED NO GROWTH TO DATE CULTURE WILL BE HELD FOR 5 DAYS BEFORE ISSUING A FINAL NEGATIVE REPORT Performed at [facility]    Report Status PENDING  Incomplete  Urine culture     Status: None   Collection Time: 06/19/14  2:32 PM  Result Value Ref Range Status   Specimen Description URINE, CLEAN CATCH  Final   Special Requests NONE  Final   Colony Count   Final    80,000 COLONIES/ML Performed at [facility]    Culture   Final    ESCHERICHIA COLI Performed at [facility]    Report Status 06/21/2014 FINAL  Final   Organism ID, Bacteria ESCHERICHIA COLI  Final      Susceptibility   Escherichia coli - MIC*    AMPICILLIN >=32 RESISTANT Resistant     CEFAZOLIN >=64 RESISTANT Resistant     CEFTRIAXONE <=1 SENSITIVE Sensitive      CIPROFLOXACIN >=4 RESISTANT Resistant     GENTAMICIN <=1 SENSITIVE Sensitive     LEVOFLOXACIN >=8 RESISTANT Resistant     NITROFURANTOIN <=16 SENSITIVE Sensitive     TOBRAMYCIN <=1 SENSITIVE Sensitive     TRIMETH/SULFA >=320 RESISTANT Resistant     PIP/TAZO >=128 RESISTANT Resistant     * ESCHERICHIA COLI  MRSA PCR Screening     Status: None   Collection Time: 06/19/14  8:30 PM  Result Value Ref Range Status   MRSA by PCR NEGATIVE NEGATIVE Final    Comment:        The GeneXpert MRSA Assay (FDA approved for NASAL specimens only), is one component of a comprehensive MRSA colonization surveillance program. It is not intended to diagnose MRSA infection nor to guide or monitor treatment for MRSA infections.   CSF culture     Status: None (Preliminary result)   Collection Time: 06/20/14 11:33 AM  Result Value Ref Range Status   Specimen Description CSF  Final   Special Requests NONE  Final   Gram Stain   Final    FEW WBC PRESENT, PREDOMINANTLY PMN NO ORGANISMS SEEN Gram Stain Report Called to,Read Back By and Verified With: Gram Stain Report Called to,Read Back By and Verified With: ALLISON DUNDON [id number]@1 :00AM BJENN Performed at [facility]   Final    NO GROWTH 1 DAY Performed at [facility]    Report Status PENDING  Incomplete  Gram stain     Status: None   Collection Time: 06/20/14 11:33 AM  Result Value Ref Range Status   Specimen Description CSF  Final   Special Requests NONE  Final   Gram Stain   Final    CYTOSPIN PREP WBC PRESENT, PREDOMINANTLY PMN NO ORGANISMS SEEN    Report Status 06/20/2014 FINAL  Final    Assessment: Her granddaughter states that she is doing better today. She was able to stand and walk in her room. She also states that she is more alert and communicative. Therefore, I will continue observation off of antibiotics  Plan: 1. Continue observation off of antibiotics  Michel Bickers, MD [facility] for  [place] [phone] pager   [phone] cell 06/23/2014, 1:33 PM

## 2014-06-23 NOTE — Progress Notes (Signed)
[facility] PROGRESS NOTE  Michelle Farrell WUJ:[id number] DOB: [birth year] DOA: 06/19/2014 PCP: Hermelinda Medicus, MD  Assessment/Plan: 1. Severe sepsis- patient came with severe sepsis with temperature 102.5 leukocytosis, lactic acidosis and tachypnea with respirations in the 30s. Likely the source is UTI, CSF analysis does not show any infectious etiology. Patient was started on vancomycin, ampicillin and Rocephin for possible bacterial meningitis. Neurology is following, will narrow down antibiotics once more data is available. WBC down to 11.2 2. Acute encephalopathy- resolving, likely from UTI, MRI showed acute stroke in posterior medial thalamus. Neurology following. ID consulted for possible meningitis, and ID does not feel that she has bacterial meningitis , considering the few wbc in csf and normal protein. ID has discontinued the antibiotics. She also was started on Keppra per neuro, no seizures , EEG showed diffuse slowing with no epileptiform discharges. Called and discussed with Neuro PA, and he says ok to d/c Keppra, he will discuss with neurologist. Patient is now improving off keppra.  3. 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 Nothing by mouth for now until more alert to proceed with speech therapy. 2-D echo showed EF 60-65%, no thrombus noted in the echo. Carotid duplex showed 1-39% left ICA stenosis, unable to visualize right ICA due to patient's inability to keep head turned. 4. Hypokalemia- potassium is 4.6 today. IV fluids  normal saline with 20 of KCl at 75 per hour.  5. ? UTI- patient came with abnormal UA. Urine cultures are growing Escherichia coli, but the culture are growing 80,000 colonies. ID has discontinued rocephin. Will monitor for fever then repeat UA if needed. 6. Hypertension- patient currently on hydralazine. BP is now elevated Will restart Catapres 0.1 twice a day, rest of the medications are on hold for permissive hypertension. BP is  stable 328 7. Diabetes mellitus- patient is currently on sliding-scale insulin. Blood glucose is well controlled. 8. Acute kidney injury- resolved after  IV fluids. Creatinine 0.85 today.  Code Status: Full code Family Communication: Discussed with patient's daughter at bedside Disposition Plan: *To be decided   Consultants:  None  Procedures:  None  Antibiotics:  Rocephin 3/24  Vancomycin 3/24  Ampicillin 3/24  HPI/Subjective: *74 y.o. female, with PMH of heart failure (unspecified), diabetes mellitus, chronic hip, back, and leg pain, hypertension, and well-controlled Crohn's disease, 2 recent hospitalizations in the last 4.5 weeks. She was discharged on February 21 after suffering with nausea, vomiting, diarrhea, and acute renal failure and was discharged again on March 21 after having same symptoms. On admission, pt was lethargic and unable to provide any history. Granddaughter provided history and reports malodorous urine and low grade fevers, also reports that pt has been complaining of abd pain. Granddaughter also reported poor oral intake and mostly non productive cough. She found her on the floor when she went to check up on her at 10 am morning of admission. Patient underwent lumbar puncture, CSF analysis is negative for infection.  Today patient is more alert, communicating. Mental status has improved after stopping keppra.  Objective: Filed Vitals:   06/23/14 1156  BP: 165/64  Pulse: 67  Temp:   Resp:     Intake/Output Summary (Last 24 hours) at 06/23/14 1213 Last data filed at 06/23/14 0830  Gross per 24 hour  Intake      0 ml  Output    300 ml  Net   -300 ml   Filed Weights   06/21/14 0546 06/22/14 0400 06/23/14 0555  Weight: 74.526 kg (  164 lb 4.8 oz) 72.576 kg (160 lb) 72.575 kg (160 lb)    Exam:   General:  Lethargic though arousable and able to answer questions.  Cardiovascular: S1-S2 is regular  Respiratory: Clear to auscultation  bilaterally  Abdomen: Soft, nontender no organomegaly  Musculoskeletal: No edema of the lower extremities.   Data Reviewed: Basic Metabolic Panel:  Recent Labs Lab 06/19/14 1234 06/20/14 0520 06/21/14 0550 06/22/14 0442 06/23/14 0545  NA 139 137 139 137 144  K 3.9 2.8* 3.4* 3.2* 4.6  CL 102 107 107 108 118*  CO2 25 22 26 22 21   GLUCOSE 201* 129* 114* 140* 116*  BUN 8 6 <5* <5* <5*  CREATININE 1.21* 0.98 0.85 0.90 0.93  CALCIUM 10.3 9.1 9.3 9.1 8.5  MG  --   --  1.5  --   --    Liver Function Tests:  Recent Labs Lab 06/19/14 1234  AST 32  ALT 18  ALKPHOS 65  BILITOT 0.5  PROT 7.0  ALBUMIN 3.8   No results for input(s): LIPASE, AMYLASE in the last 168 hours. No results for input(s): AMMONIA in the last 168 hours. CBC:  Recent Labs Lab 06/19/14 1234 06/20/14 0520 06/21/14 0550 06/23/14 0545  WBC 16.1* 15.2* 11.2* 8.2  NEUTROABS 13.5*  --   --   --   HGB 12.5 11.2* 9.8* 8.2*  HCT 35.8* 31.9* 28.5* 23.5*  MCV 88.0 86.7 86.9 87.4  PLT 204 180 167 151   Cardiac Enzymes:  Recent Labs Lab 06/19/14 2023  CKTOTAL 624*   BNP (last 3 results) No results for input(s): BNP in the last 8760 hours.  ProBNP (last 3 results)  Recent Labs  08/27/13 2205 08/28/13 0454  PROBNP 263.1* 251.0*    CBG:  Recent Labs Lab 06/22/14 1618 06/22/14 2016 06/22/14 2347 06/23/14 0729 06/23/14 1145  GLUCAP 157* 161* 113* 113* 137*    Recent Results (from the past 240 hour(s))  Blood Culture (routine x 2)     Status: None (Preliminary result)   Collection Time: 06/19/14 12:23 PM  Result Value Ref Range Status   Specimen Description BLOOD LEFT ANTECUBITAL  Final   Special Requests BOTTLES DRAWN AEROBIC AND ANAEROBIC 3CC  Final   Culture   Final           BLOOD CULTURE RECEIVED NO GROWTH TO DATE CULTURE WILL BE HELD FOR 5 DAYS BEFORE ISSUING A FINAL NEGATIVE REPORT Performed at [facility]    Report Status PENDING  Incomplete  Blood Culture (routine x  2)     Status: None (Preliminary result)   Collection Time: 06/19/14 12:36 PM  Result Value Ref Range Status   Specimen Description BLOOD LEFT ANTECUBITAL  Final   Special Requests BOTTLES DRAWN AEROBIC AND ANAEROBIC 5ML  Final   Culture   Final           BLOOD CULTURE RECEIVED NO GROWTH TO DATE CULTURE WILL BE HELD FOR 5 DAYS BEFORE ISSUING A FINAL NEGATIVE REPORT Performed at [facility]    Report Status PENDING  Incomplete  Urine culture     Status: None   Collection Time: 06/19/14  2:32 PM  Result Value Ref Range Status   Specimen Description URINE, CLEAN CATCH  Final   Special Requests NONE  Final   Colony Count   Final    80,000 COLONIES/ML Performed at [facility]    Culture   Final    ESCHERICHIA COLI Performed at [facility]    Report Status 06/21/2014 FINAL  Final   Organism ID, Bacteria ESCHERICHIA COLI  Final      Susceptibility   Escherichia coli - MIC*    AMPICILLIN >=32 RESISTANT Resistant     CEFAZOLIN >=64 RESISTANT Resistant     CEFTRIAXONE <=1 SENSITIVE Sensitive     CIPROFLOXACIN >=4 RESISTANT Resistant     GENTAMICIN <=1 SENSITIVE Sensitive     LEVOFLOXACIN >=8 RESISTANT Resistant     NITROFURANTOIN <=16 SENSITIVE Sensitive     TOBRAMYCIN <=1 SENSITIVE Sensitive     TRIMETH/SULFA >=320 RESISTANT Resistant     PIP/TAZO >=128 RESISTANT Resistant     * ESCHERICHIA COLI  MRSA PCR Screening     Status: None   Collection Time: 06/19/14  8:30 PM  Result Value Ref Range Status   MRSA by PCR NEGATIVE NEGATIVE Final    Comment:        The GeneXpert MRSA Assay (FDA approved for NASAL specimens only), is one component of a comprehensive MRSA colonization surveillance program. It is not intended to diagnose MRSA infection nor to guide or monitor treatment for MRSA infections.   CSF culture     Status: None (Preliminary result)   Collection Time: 06/20/14 11:33 AM  Result Value Ref Range Status   Specimen Description CSF  Final    Special Requests NONE  Final   Gram Stain   Final    FEW WBC PRESENT, PREDOMINANTLY PMN NO ORGANISMS SEEN Gram Stain Report Called to,Read Back By and Verified With: Gram Stain Report Called to,Read Back By and Verified With: ALLISON DUNDON 32616@1 :00AM BJENN Performed at [facility]   Final    NO GROWTH 1 DAY Performed at [facility]    Report Status PENDING  Incomplete  Gram stain     Status: None   Collection Time: 06/20/14 11:33 AM  Result Value Ref Range Status   Specimen Description CSF  Final   Special Requests NONE  Final   Gram Stain   Final    CYTOSPIN PREP WBC PRESENT, PREDOMINANTLY PMN NO ORGANISMS SEEN    Report Status 06/20/2014 FINAL  Final     Studies: No results found.  Scheduled Meds: . aspirin EC  325 mg Oral Daily  . cloNIDine  0.1 mg Oral BID  . famotidine  20 mg Oral QHS  . heparin  5,000 Units Subcutaneous 3 times per day  . hydrALAZINE  5 mg Intravenous Q6H  . insulin aspart  0-15 Units Subcutaneous 6 times per day  . nicotine  21 mg Transdermal Daily  . simvastatin  40 mg Oral q1800  . sodium chloride  3 mL Intravenous Q12H   Continuous Infusions: . sodium chloride 0.9 % 1,000 mL with potassium chloride 20 mEq infusion 75 mL/hr at 06/23/14 6073    Principal Problem:   Sepsis Active Problems:   Crohn disease   Pelvic mass   Back pain, chronic   DM (diabetes mellitus)   Acute encephalopathy   UTI (lower urinary tract infection)   Tobacco abuse   CVA (cerebral infarction)    Time spent: 25 min    [facility] S  [facility] Pager [phone]*. If 7PM-7AM, please contact night-coverage at [email or web address], password [facility] 06/23/2014, 12:13 PM  LOS: 4 days

## 2014-06-23 NOTE — Progress Notes (Signed)
Rehab Admissions Coordinator Note:  Patient was screened by Roel Douthat L for appropriateness for an Inpatient Acute Rehab Consult.  At this time, we are recommending Inpatient Rehab consult.  Bexton Haak L 06/23/2014, 11:14 AM  I can be reached at [phone].

## 2014-06-23 NOTE — Progress Notes (Signed)
Utilization review completed.  

## 2014-06-23 NOTE — Progress Notes (Signed)
Speech Language Pathology Treatment: Dysphagia;Cognitive-Linquistic  Patient Details Name: Michelle Farrell MRN: [id number] DOB: 1940-08-20 Today's Date: 06/23/2014 Time: 5449-2010 SLP Time Calculation (min) (ACUTE ONLY): 23 min  Assessment / Plan / Recommendation Clinical Impression  Provided dysphagia and cognitive treatment today. Dysphagia treatment for diet tolerance/ advancement- pt demonstrated no overt s/s of aspiration with nectar thick/ thin liquid trials but continues to have multiple swallows with all POs. Pt is very impulsive with straw and unable to follow cues to take small bites/ sips; therefore recommend no straws as pt's rate was slower via cup sips. Recommend upgrading liquids to thin with full supervision during all PO consumption to cue for small bites/ sips.   Provided multimodal cues for cognitive treatment. Pt continues to demonstrate significantly impaired auditory comprehension and perseveration. Answered "yes" to all yes/ no questions. If a question was open-ended, pt responded "applesauce." Was able to state name but disoriented to place even when provided 2 choices. Max cues needed for basic 1-step commands. Needed frequent reminders that spoon was not a straw. Will continue to follow for cognition- pt benefits from maximal multimodal cues.    HPI HPI: Michelle Farrell is a 74 y.o. female, with a pmh of heart failure (unspecified), diabetes mellitus, chronic hip and back and leg pain, hypertension, and well-controlled Crohn's disease. She has had 2 recent hospitalizations in the last 4.5 weeks. She was discharged on February 21 after suffering with nausea, vomiting, diarrhea, and acute renal failure. She was then discharged on March 21 after having nausea, vomiting, diarrhea and acute renal failure. The patient is currently lethargic and not speaking on 06/19/14, consequently history was collected from her granddaughter Michelle Farrell who is a Designer, jewellery at [employer].  Michelle Farrell relates that her grandmother was not completely well when she left the hospital on 06/16/14. She has been having low-grade temperatures and odorous urine.  Her grandaughter found her unresponsive on 06/19/14 at her home.       Pertinent Vitals Pain Assessment: Faces Pain Score: 0-No pain Faces Pain Scale: No hurt  SLP Plan  Continue with current plan of care    Recommendations Diet recommendations: Dysphagia 2 (fine chop);Thin liquid Liquids provided via: Cup;No straw Medication Administration: Crushed with puree Supervision: Full supervision/cueing for compensatory strategies Compensations: Slow rate;Small sips/bites Postural Changes and/or Swallow Maneuvers: Seated upright 90 degrees;Upright 30-60 min after meal              Oral Care Recommendations: Oral care BID Follow up Recommendations: Inpatient Rehab Plan: Continue with current plan of care    GO     Kern Reap, MA, CCC-SLP 06/23/2014, 2:45 PM

## 2014-06-24 DIAGNOSIS — G039 Meningitis, unspecified: Secondary | ICD-10-CM | POA: Insufficient documentation

## 2014-06-24 DIAGNOSIS — M545 Low back pain: Secondary | ICD-10-CM

## 2014-06-24 LAB — URINALYSIS, ROUTINE W REFLEX MICROSCOPIC
BILIRUBIN URINE: NEGATIVE
GLUCOSE, UA: NEGATIVE mg/dL
Hgb urine dipstick: NEGATIVE
KETONES UR: 15 mg/dL — AB
Leukocytes, UA: NEGATIVE
NITRITE: NEGATIVE
Protein, ur: NEGATIVE mg/dL
Specific Gravity, Urine: 1.014 (ref 1.005–1.030)
Urobilinogen, UA: 0.2 mg/dL (ref 0.0–1.0)
pH: 6.5 (ref 5.0–8.0)

## 2014-06-24 LAB — CSF CULTURE: Culture: NO GROWTH

## 2014-06-24 LAB — CBC
HCT: 24.3 % — ABNORMAL LOW (ref 36.0–46.0)
Hemoglobin: 8.6 g/dL — ABNORMAL LOW (ref 12.0–15.0)
MCH: 30.3 pg (ref 26.0–34.0)
MCHC: 35.4 g/dL (ref 30.0–36.0)
MCV: 85.6 fL (ref 78.0–100.0)
Platelets: 175 10*3/uL (ref 150–400)
RBC: 2.84 MIL/uL — AB (ref 3.87–5.11)
RDW: 14.5 % (ref 11.5–15.5)
WBC: 7.5 10*3/uL (ref 4.0–10.5)

## 2014-06-24 LAB — GLUCOSE, CAPILLARY
GLUCOSE-CAPILLARY: 133 mg/dL — AB (ref 70–99)
GLUCOSE-CAPILLARY: 131 mg/dL — AB (ref 70–99)
GLUCOSE-CAPILLARY: 160 mg/dL — AB (ref 70–99)
GLUCOSE-CAPILLARY: 148 mg/dL — AB (ref 70–99)
Glucose-Capillary: 123 mg/dL — ABNORMAL HIGH (ref 70–99)
Glucose-Capillary: 113 mg/dL — ABNORMAL HIGH (ref 70–99)

## 2014-06-24 LAB — CSF CULTURE W GRAM STAIN

## 2014-06-24 MED ORDER — SPIRONOLACTONE 25 MG PO TABS
25.0000 mg | ORAL_TABLET | Freq: Every day | ORAL | Status: DC
  Administered 2014-06-24 – 2014-06-27 (×4): 25 mg via ORAL
  Filled 2014-06-24 (×4): qty 1

## 2014-06-24 MED ORDER — CEFTRIAXONE SODIUM IN DEXTROSE 40 MG/ML IV SOLN
2.0000 g | Freq: Two times a day (BID) | INTRAVENOUS | Status: DC
  Administered 2014-06-24 – 2014-06-27 (×6): 2 g via INTRAVENOUS
  Filled 2014-06-24 (×8): qty 50

## 2014-06-24 MED ORDER — LISINOPRIL 10 MG PO TABS
10.0000 mg | ORAL_TABLET | Freq: Every day | ORAL | Status: DC
  Administered 2014-06-24 – 2014-06-27 (×5): 10 mg via ORAL
  Filled 2014-06-24 (×4): qty 1

## 2014-06-24 MED ORDER — VANCOMYCIN HCL IN DEXTROSE 750-5 MG/150ML-% IV SOLN
750.0000 mg | Freq: Two times a day (BID) | INTRAVENOUS | Status: DC
  Administered 2014-06-24 – 2014-06-27 (×6): 750 mg via INTRAVENOUS
  Filled 2014-06-24 (×9): qty 150

## 2014-06-24 NOTE — Progress Notes (Signed)
Elevated temperature 100.6 rectal, Dr Megan Salon notified, tylenol suppository administered.

## 2014-06-24 NOTE — Progress Notes (Signed)
Physical Therapy Treatment Patient Details Name: Michelle Farrell MRN: [id number] DOB: 1940/08/19 Today's Date: 06/24/2014    History of Present Illness 74 y.o. female with a history of Crohn's disease, Diastolic CHF, HTN, IDDM, and Hyperlipidemia, COPD, neuropathy, chronic back pain admitted with nausea, vomiting, diarrhea, and periods of unresponsiveness.  MRI showed left dorsal BG infarct.  Work up continues    PT Comments    Patient not progressing due to lethargy/decreased arousal.  Attempts to arouse pt and sit on edge of bed unsuccessful. Patient mainly grunting and one word answers inappropriate to questions.  Will continue to assess appropriateness for CIR.  Hopefully better once back on antibiotics.  Follow Up Recommendations  CIR;Supervision/Assistance - 24 hour     Equipment Recommendations  None recommended by PT    Recommendations for Other Services Rehab consult     Precautions / Restrictions Precautions Precautions: Fall    Mobility  Bed Mobility Overal bed mobility: Needs Assistance Bed Mobility: Rolling;Sidelying to Sit Rolling: Mod assist Sidelying to sit: Max assist   Sit to supine: Mod assist   General bed mobility comments: pt very lethargic, assisted to sit up to increase arousal, pt kept eyes closed; responded mostly in grunts to attempts to get pt to participate in mobility; then she stated "I need to lay down" and she put head back and needed assist for LE's  Transfers                    Ambulation/Gait                 Stairs            Wheelchair Mobility    Modified Rankin (Stroke Patients Only)       Balance     Sitting balance-Leahy Scale: Fair Sitting balance - Comments: sat edge of bed about 2-3 minutes with supervision; attempted to arouse pt/engage in mobility, then she finally refused and laid back down quickly                            Cognition Arousal/Alertness: Lethargic Behavior During  Therapy: Flat affect Overall Cognitive Status: Difficult to assess                      Exercises      General Comments        Pertinent Vitals/Pain Faces Pain Scale: No hurt (initially denies pain, then later in session c/o generalized aches)    Home Living                      Prior Function            PT Goals (current goals can now be found in the care plan section) Progress towards PT goals: Not progressing toward goals - comment (decline in level of arousal)    Frequency  Min 3X/week    PT Plan Current plan remains appropriate    Co-evaluation             End of Session Equipment Utilized During Treatment: Gait belt Activity Tolerance: Patient limited by lethargy Patient left: in bed;with family/visitor present     Time: 5883-2549 PT Time Calculation (min) (ACUTE ONLY): 21 min  Charges:  $Therapeutic Activity: 8-22 mins                    G Codes:  WYNN,CYNDI 06/24/2014, 5:03 PM  Magda Kiel, [place] 06/24/2014

## 2014-06-24 NOTE — Progress Notes (Addendum)
Speech Language Pathology Treatment: Dysphagia;Cognitive-Linquistic  Patient Details Name: Michelle Farrell MRN: [id number] DOB: 12-03-1940 Today's Date: 06/24/2014 Time: 2482-5003 SLP Time Calculation (min) (ACUTE ONLY): 25 min  Assessment / Plan / Recommendation Clinical Impression  Dysphagia treatment provided today for diet tolerance check, compensatory strategy training, and family education. Pt did not demonstrate any overt s/s of aspiration with thin liquids. Pt continues to be very impulsive and does not follow maximal verbal/ tactile cues to take small sips. Recommend continuing cup sips only- no straws. Granddaughter at bedside today reported that pt did well with breakfast- had one delayed cough following bite of sausage. Was unable to assess tolerance of solids any further as pt was refusing foods. Recommend continuing dysphagia 2 diet/ thin liquids, full supervision. Farrell continue to follow for diet tolerance.   Cognitive-linguistic treatment provided today as well for orientation, attention to task, yes/ no questions, following directions, providing multimodal cues. Upon entering room, RN and NT were with pt, who was not following instructions to stay on toilet- repeatedly got up and went back and forth between sitting on bed and sitting on toilet. Pt finally sat on toilet and urinated, then returned to bed with assistance. Pt was not verbal today during treatment despite maximal verbal/ visual cues. Responded to all questions/ prompts with "mmmmm". Also making little eye contact today both SLP and granddaughter. Not following 1-step commands. This is a regression from yesterday when pt was responding (although inconsistent) to yes/no questions and attempting to verbally respond to questions. Farrell continue to follow for cognitive-linguistic needs. Spoke with RN who is aware of decline in cognitive status.   HPI HPI: Michelle Farrell is a 74 y.o. 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 She has had 2 recent hospitalizations in the last 4.5 weeks. She was discharged on February 21 after suffering with nausea, vomiting, diarrhea, and acute renal failure. She was then discharged on March 21 after having nausea, vomiting, diarrhea and acute renal failure. The patient is currently lethargic and not speaking on 06/19/14, consequently history was collected from her granddaughter Michelle Farrell who is a Designer, jewellery at [employer] [employer]. Michelle Farrell relates that her grandmother was not completely well when she left the hospital on 06/16/14. She has been having low-grade temperatures and odorous urine.  Her grandaughter found her unresponsive on 06/19/14 at her home.       Pertinent Vitals Pain Assessment: Faces Pain Score: 0-No pain Faces Pain Scale: No hurt  SLP Plan  Continue with current plan of care    Recommendations Diet recommendations: Dysphagia 2 (fine chop);Thin liquid Liquids provided via: Cup Medication Administration: Crushed with puree Supervision: Full supervision/cueing for compensatory strategies Compensations: Slow rate;Small sips/bites Postural Changes and/or Swallow Maneuvers: Seated upright 90 degrees;Upright 30-60 min after meal              Oral Care Recommendations: Oral care BID Follow up Recommendations: Inpatient Rehab Plan: Continue with current plan of care    Duson, Michelle Farrell, Michelle Farrell, CCC-SLP 06/24/2014, 10:50 AM  [phone]

## 2014-06-24 NOTE — Progress Notes (Signed)
ANTIBIOTIC CONSULT NOTE - INITIAL  Pharmacy Consult for Vancomycin Indication: UTI and probable meningitis  No Known Allergies  Patient Measurements: Height: 5' 6"  (167.6 cm) Weight: 160 lb 0.9 oz (72.6 kg) IBW/kg (Calculated) : 59.3  Vital Signs: Temp: 100.6 F (38.1 C) (03/29 1440) Temp Source: Rectal (03/29 1440) BP: 107/47 mmHg (03/29 1440) Pulse Rate: 75 (03/29 1440) Intake/Output from previous day: 03/28 0701 - 03/29 0700 In: -  Out: 1525 [[id number]] Intake/Output from this shift: Total I/O In: -  Out: 400 [Urine:400]  Labs:  Recent Labs  06/22/14 0442 06/23/14 0545  WBC  --  8.2  HGB  --  8.2*  PLT  --  151  CREATININE 0.90 0.93   Estimated Creatinine Clearance: 55.8 mL/min (by C-G formula based on Cr of 0.93). No results for input(s): VANCOTROUGH, VANCOPEAK, VANCORANDOM, GENTTROUGH, GENTPEAK, GENTRANDOM, TOBRATROUGH, TOBRAPEAK, TOBRARND, AMIKACINPEAK, AMIKACINTROU, AMIKACIN in the last 72 hours.   Microbiology: Recent Results (from the past 720 hour(s))  Blood Culture (routine x 2)     Status: None (Preliminary result)   Collection Time: 06/19/14 12:23 PM  Result Value Ref Range Status   Specimen Description BLOOD LEFT ANTECUBITAL  Final   Special Requests BOTTLES DRAWN AEROBIC AND ANAEROBIC 3CC  Final   Culture   Final           BLOOD CULTURE RECEIVED NO GROWTH TO DATE CULTURE WILL BE HELD FOR 5 DAYS BEFORE ISSUING A FINAL NEGATIVE REPORT Performed at [facility]    Report Status PENDING  Incomplete  Blood Culture (routine x 2)     Status: None (Preliminary result)   Collection Time: 06/19/14 12:36 PM  Result Value Ref Range Status   Specimen Description BLOOD LEFT ANTECUBITAL  Final   Special Requests BOTTLES DRAWN AEROBIC AND ANAEROBIC 5ML  Final   Culture   Final           BLOOD CULTURE RECEIVED NO GROWTH TO DATE CULTURE WILL BE HELD FOR 5 DAYS BEFORE ISSUING A FINAL NEGATIVE REPORT Performed at [facility]    Report Status  PENDING  Incomplete  Urine culture     Status: None   Collection Time: 06/19/14  2:32 PM  Result Value Ref Range Status   Specimen Description URINE, CLEAN CATCH  Final   Special Requests NONE  Final   Colony Count   Final    80,000 COLONIES/ML Performed at [facility]    Culture   Final    ESCHERICHIA COLI Performed at [facility]    Report Status 06/21/2014 FINAL  Final   Organism ID, Bacteria ESCHERICHIA COLI  Final      Susceptibility   Escherichia coli - MIC*    AMPICILLIN >=32 RESISTANT Resistant     CEFAZOLIN >=64 RESISTANT Resistant     CEFTRIAXONE <=1 SENSITIVE Sensitive     CIPROFLOXACIN >=4 RESISTANT Resistant     GENTAMICIN <=1 SENSITIVE Sensitive     LEVOFLOXACIN >=8 RESISTANT Resistant     NITROFURANTOIN <=16 SENSITIVE Sensitive     TOBRAMYCIN <=1 SENSITIVE Sensitive     TRIMETH/SULFA >=320 RESISTANT Resistant     PIP/TAZO >=128 RESISTANT Resistant     * ESCHERICHIA COLI  MRSA PCR Screening     Status: None   Collection Time: 06/19/14  8:30 PM  Result Value Ref Range Status   MRSA by PCR NEGATIVE NEGATIVE Final    Comment:        The GeneXpert MRSA Assay (FDA  approved for NASAL specimens only), is one component of a comprehensive MRSA colonization surveillance program. It is not intended to diagnose MRSA infection nor to guide or monitor treatment for MRSA infections.   CSF culture     Status: None   Collection Time: 06/20/14 11:33 AM  Result Value Ref Range Status   Specimen Description CSF  Final   Special Requests NONE  Final   Gram Stain   Final    FEW WBC PRESENT, PREDOMINANTLY PMN NO ORGANISMS SEEN Gram Stain Report Called to,Read Back By and Verified With: Gram Stain Report Called to,Read Back By and Verified With: ALLISON DUNDON [id number]@1 :00AM BJENN Performed at [facility]   Final    NO GROWTH 3 DAYS Performed at [facility]    Report Status 06/24/2014 FINAL  Final  Gram stain     Status:  None   Collection Time: 06/20/14 11:33 AM  Result Value Ref Range Status   Specimen Description CSF  Final   Special Requests NONE  Final   Gram Stain   Final    CYTOSPIN PREP WBC PRESENT, PREDOMINANTLY PMN NO ORGANISMS SEEN    Report Status 06/20/2014 FINAL  Final    Assessment: 74 y.o. female known to pharmacy from previous antibiotic dosing. Pt has been off of antibiotics x 48hours. ID service following. Pt has developed recurrent low-grade fever and diminished mental status again. Previous cultures showed Ecoli (sensitive to Rocephin, [place], [place], [place]). Plan to pan culture. Tm 100.6. WBC wnl. SCr 0.93 (stable), est CrCl 55 ml/min. To restart Rocephin and Vancomycin for UTI and probable meningitis.  Goal of Therapy:  Vancomycin trough level 15-20 mcg/ml  Plan:  1. Vancomycin 78m IV q12h 2. Will f/u micro data, pt's clinical condition, and renal function 3. VT at [facility]  CSherlon Handing PharmD, BCPS Clinical pharmacist, pager [phone]/29/2016,3:41 PM

## 2014-06-24 NOTE — Progress Notes (Signed)
Inpatient Rehabilitation  I met with the patient and her granddaughter Gypsy Lore at the bedside to discuss pt.'s post acute rehab needs and options.  I provided informational brochures and answered Mignon Pine' questions.  Per granddaughter, pt. Was up about every 30 minutes overnight and slept during most of my time with them in the room.  [name] wants her grandmother to come to IP Rehab but is considering the possibility of SNF , at least as a backup plan.  I communicated with Loanne Drilling CM and Poonum Ambelal, SW that I will initiate insurance authorization process at this time .  Poonum says she plans to speak with granddaughter. Please call if questions.  [facility] Admissions Coordinator Cell [phone] Office [phone]

## 2014-06-24 NOTE — Progress Notes (Signed)
[facility] PROGRESS NOTE  Michelle Farrell IOX:[id number] DOB: [birth year] DOA: 06/19/2014 PCP: Hermelinda Medicus, MD  Assessment/Plan: 1. Severe sepsis- patient came with severe sepsis with temperature 102.5 leukocytosis, lactic acidosis and tachypnea with respirations in the 30s. Likely the source is UTI, CSF analysis does not show any infectious etiology. Patient was started on vancomycin, ampicillin and Rocephin for possible bacterial meningitis. Neurology is following, will narrow down antibiotics once more data is available. WBC down to 11.2.D consulted for possible meningitis, and ID does not feel that she has bacterial meningitis , considering the few wbc in csf and normal protein. ID has discontinued the antibiotics. ID following. 2. Acute encephalopathy- slowly resolving, likely from CVA MRI showed acute stroke in posterior medial thalamus. Neurology following. I She also was started on Keppra per neuro, no seizures , EEG showed moderate to severe diffuse slowing with no epileptiform discharges. Called and discussed with Neuro PA, and he says ok to d/c Keppra, he will discuss with neurologist. Patient is now improving off keppra.  3. 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 Nothing by mouth for now until more alert to proceed with speech therapy. 2-D echo showed EF 60-65%, no thrombus noted in the echo. Carotid duplex showed 1-39% left ICA stenosis, unable to visualize right ICA due to patient's inability to keep head turned. 4. Hypokalemia- resolved ,potassium is 4.6 today. IV fluids  normal saline with 20 of KCl at 75 per hour.  5. ? UTI- patient came with abnormal UA. Urine cultures are growing Escherichia coli, but the culture are growing 80,000 colonies. ID has discontinued rocephin. Will monitor for fever then repeat UA if needed. 6. Hypertension- patient currently on hydralazine. BP was elevated We restarted Catapres 0.1 twice a day, rest of the medications were   on hold for permissive hypertension. We'll restart the antihypertensive medications. 7. Diabetes mellitus- patient is currently on sliding-scale insulin. Blood glucose is well controlled. 8. Acute kidney injury- resolved after  IV fluids. Creatinine 0.85 today.  Code Status: Full code Family Communication: Discussed with patient's daughter at bedside Disposition Plan: *To be decided   Consultants:  None  Procedures:  None  Antibiotics:  Rocephin 3/24- 3/27  Vancomycin 3/24- 3/27  Ampicillin 3/24- 3/27  HPI/Subjective: *74 y.o. female, with PMH of heart failure (unspecified), diabetes mellitus, chronic hip, back, and leg pain, hypertension, and well-controlled Crohn's disease, 2 recent hospitalizations in the last 4.5 weeks. She was discharged on February 21 after suffering with nausea, vomiting, diarrhea, and acute renal failure and was discharged again on March 21 after having same symptoms. On admission, pt was lethargic and unable to provide any history. Granddaughter provided history and reports malodorous urine and low grade fevers, also reports that pt has been complaining of abd pain. Granddaughter also reported poor oral intake and mostly non productive cough. She found her on the floor when she went to check up on her at 10 am morning of admission. Patient underwent lumbar puncture, CSF analysis is negative for infection.  Today patient is more alert, communicating. Though she still continues to be confused.  Objective: Filed Vitals:   06/24/14 1152  BP: 148/67  Pulse: 79  Temp: 98.8 F (37.1 C)  Resp: 18    Intake/Output Summary (Last 24 hours) at 06/24/14 1415 Last data filed at 06/24/14 1045  Gross per 24 hour  Intake      0 ml  Output   1725 ml  Net  -1725 ml   [employer]  06/22/14 0400 06/23/14 0555 06/24/14 0400  Weight: 72.576 kg (160 lb) 72.575 kg (160 lb) 72.6 kg (160 lb 0.9 oz)    Exam:   General:  Lethargic though arousable and able to  answer questions.  Cardiovascular: S1-S2 is regular  Respiratory: Clear to auscultation bilaterally  Abdomen: Soft, nontender no organomegaly  Musculoskeletal: No edema of the lower extremities.   Data Reviewed: Basic Metabolic Panel:  Recent Labs Lab 06/19/14 1234 06/20/14 0520 06/21/14 0550 06/22/14 0442 06/23/14 0545  NA 139 137 139 137 144  K 3.9 2.8* 3.4* 3.2* 4.6  CL 102 107 107 108 118*  CO2 25 22 26 22 21   GLUCOSE 201* 129* 114* 140* 116*  BUN 8 6 <5* <5* <5*  CREATININE 1.21* 0.98 0.85 0.90 0.93  CALCIUM 10.3 9.1 9.3 9.1 8.5  MG  --   --  1.5  --   --    Liver Function Tests:  Recent Labs Lab 06/19/14 1234  AST 32  ALT 18  ALKPHOS 65  BILITOT 0.5  PROT 7.0  ALBUMIN 3.8   No results for input(s): LIPASE, AMYLASE in the last 168 hours. No results for input(s): AMMONIA in the last 168 hours. CBC:  Recent Labs Lab 06/19/14 1234 06/20/14 0520 06/21/14 0550 06/23/14 0545  WBC 16.1* 15.2* 11.2* 8.2  NEUTROABS 13.5*  --   --   --   HGB 12.5 11.2* 9.8* 8.2*  HCT 35.8* 31.9* 28.5* 23.5*  MCV 88.0 86.7 86.9 87.4  PLT 204 180 167 151   Cardiac Enzymes:  Recent Labs Lab 06/19/14 2023  CKTOTAL 624*   BNP (last 3 results) No results for input(s): BNP in the last 8760 hours.  ProBNP (last 3 results)  Recent Labs  08/27/13 2205 08/28/13 0454  PROBNP 263.1* 251.0*    CBG:  Recent Labs Lab 06/23/14 2011 06/23/14 2356 06/24/14 0329 06/24/14 0759 06/24/14 1147  GLUCAP 121* 115* 133* 131* 160*    Recent Results (from the past 240 hour(s))  Blood Culture (routine x 2)     Status: None (Preliminary result)   Collection Time: 06/19/14 12:23 PM  Result Value Ref Range Status   Specimen Description BLOOD LEFT ANTECUBITAL  Final   Special Requests BOTTLES DRAWN AEROBIC AND ANAEROBIC 3CC  Final   Culture   Final           BLOOD CULTURE RECEIVED NO GROWTH TO DATE CULTURE WILL BE HELD FOR 5 DAYS BEFORE ISSUING A FINAL NEGATIVE  REPORT Performed at [facility]    Report Status PENDING  Incomplete  Blood Culture (routine x 2)     Status: None (Preliminary result)   Collection Time: 06/19/14 12:36 PM  Result Value Ref Range Status   Specimen Description BLOOD LEFT ANTECUBITAL  Final   Special Requests BOTTLES DRAWN AEROBIC AND ANAEROBIC 5ML  Final   Culture   Final           BLOOD CULTURE RECEIVED NO GROWTH TO DATE CULTURE WILL BE HELD FOR 5 DAYS BEFORE ISSUING A FINAL NEGATIVE REPORT Performed at [facility]    Report Status PENDING  Incomplete  Urine culture     Status: None   Collection Time: 06/19/14  2:32 PM  Result Value Ref Range Status   Specimen Description URINE, CLEAN CATCH  Final   Special Requests NONE  Final   Colony Count   Final    80,000 COLONIES/ML Performed at [facility]  Final    ESCHERICHIA COLI Performed at [facility]    Report Status 06/21/2014 FINAL  Final   Organism ID, Bacteria ESCHERICHIA COLI  Final      Susceptibility   Escherichia coli - MIC*    AMPICILLIN >=32 RESISTANT Resistant     CEFAZOLIN >=64 RESISTANT Resistant     CEFTRIAXONE <=1 SENSITIVE Sensitive     CIPROFLOXACIN >=4 RESISTANT Resistant     GENTAMICIN <=1 SENSITIVE Sensitive     LEVOFLOXACIN >=8 RESISTANT Resistant     NITROFURANTOIN <=16 SENSITIVE Sensitive     TOBRAMYCIN <=1 SENSITIVE Sensitive     TRIMETH/SULFA >=320 RESISTANT Resistant     PIP/TAZO >=128 RESISTANT Resistant     * ESCHERICHIA COLI  MRSA PCR Screening     Status: None   Collection Time: 06/19/14  8:30 PM  Result Value Ref Range Status   MRSA by PCR NEGATIVE NEGATIVE Final    Comment:        The GeneXpert MRSA Assay (FDA approved for NASAL specimens only), is one component of a comprehensive MRSA colonization surveillance program. It is not intended to diagnose MRSA infection nor to guide or monitor treatment for MRSA infections.   CSF culture     Status: None   Collection  Time: 06/20/14 11:33 AM  Result Value Ref Range Status   Specimen Description CSF  Final   Special Requests NONE  Final   Gram Stain   Final    FEW WBC PRESENT, PREDOMINANTLY PMN NO ORGANISMS SEEN Gram Stain Report Called to,Read Back By and Verified With: Gram Stain Report Called to,Read Back By and Verified With: ALLISON DUNDON [id number]@1 :00AM BJENN Performed at [facility]   Final    NO GROWTH 3 DAYS Performed at [facility]    Report Status 06/24/2014 FINAL  Final  Gram stain     Status: None   Collection Time: 06/20/14 11:33 AM  Result Value Ref Range Status   Specimen Description CSF  Final   Special Requests NONE  Final   Gram Stain   Final    CYTOSPIN PREP WBC PRESENT, PREDOMINANTLY PMN NO ORGANISMS SEEN    Report Status 06/20/2014 FINAL  Final     Studies: No results found.  Scheduled Meds: . aspirin EC  325 mg Oral Daily  . cloNIDine  0.1 mg Oral BID  . famotidine  20 mg Oral QHS  . heparin  5,000 Units Subcutaneous 3 times per day  . hydrALAZINE  5 mg Intravenous Q6H  . insulin aspart  0-15 Units Subcutaneous 6 times per day  . nicotine  21 mg Transdermal Daily  . simvastatin  40 mg Oral q1800  . sodium chloride  3 mL Intravenous Q12H   Continuous Infusions: . sodium chloride 0.9 % 1,000 mL with potassium chloride 20 mEq infusion 75 mL/hr at 06/24/14 1228    Principal Problem:   Sepsis Active Problems:   Crohn disease   Pelvic mass   Back pain, chronic   DM (diabetes mellitus)   Acute encephalopathy   UTI (lower urinary tract infection)   Tobacco abuse   CVA (cerebral infarction)    Time spent: 25 min    [facility]  [facility] Pager [phone]*. If 7PM-7AM, please contact night-coverage at [email or web address], password [email or web address] 06/24/2014, 2:15 PM  LOS: 5 days

## 2014-06-24 NOTE — Progress Notes (Signed)
Patient ID: Michelle Farrell, female   DOB: 12-09-1940, 74 y.o.   MRN: [id number]         [facility]    Date of Admission:  06/19/2014   off antibiotics  48 hours  Principal Problem:   Sepsis Active Problems:   Crohn disease   Pelvic mass   Back pain, chronic   DM (diabetes mellitus)   Acute encephalopathy   UTI (lower urinary tract infection)   Tobacco abuse   CVA (cerebral infarction)   . aspirin EC  325 mg Oral Daily  . cloNIDine  0.1 mg Oral BID  . famotidine  20 mg Oral QHS  . heparin  5,000 Units Subcutaneous 3 times per day  . hydrALAZINE  5 mg Intravenous Q6H  . insulin aspart  0-15 Units Subcutaneous 6 times per day  . lisinopril  10 mg Oral Daily  . nicotine  21 mg Transdermal Daily  . simvastatin  40 mg Oral q1800  . sodium chloride  3 mL Intravenous Q12H  . spironolactone  25 mg Oral Daily     Past Medical History  Diagnosis Date  . CHF (congestive heart failure)   . Hypertension   . Back pain, chronic   . Hip pain, chronic   . GERD (gastroesophageal reflux disease)   . Crohn disease   . Diabetes mellitus without complication     History  Substance Use Topics  . Smoking status: Current Every Day Smoker  . Smokeless tobacco: Never Used  . Alcohol Use: No    Family History  Problem Relation Age of Onset  . Diabetes Mellitus II Other    No Known Allergies  OBJECTIVE: Blood pressure 107/47, pulse 75, temperature 100.6 F (38.1 C), temperature source Rectal, resp. rate 20, height 5' 6"  (1.676 m), weight 160 lb 0.9 oz (72.6 kg), SpO2 98 %. General: Much more lethargic today. Answers "uh uh" (yes) to all questions Skin: No rash Neck: Supple Lungs: Clear Cor: Regular S1 and S2 with no murmur heard Abdomen: Soft. No diarrhea noted  Lab Results Lab Results  Component Value Date   WBC 8.2 06/23/2014   HGB 8.2* 06/23/2014   HCT 23.5* 06/23/2014   MCV 87.4 06/23/2014   PLT 151 06/23/2014    Lab Results  Component  Value Date   CREATININE 0.93 06/23/2014   BUN <5* 06/23/2014   NA 144 06/23/2014   K 4.6 06/23/2014   CL 118* 06/23/2014   CO2 21 06/23/2014    Lab Results  Component Value Date   ALT 18 06/19/2014   AST 32 06/19/2014   ALKPHOS 65 06/19/2014   BILITOT 0.5 06/19/2014     Microbiology: Recent Results (from the past 240 hour(s))  Blood Culture (routine x 2)     Status: None (Preliminary result)   Collection Time: 06/19/14 12:23 PM  Result Value Ref Range Status   Specimen Description BLOOD LEFT ANTECUBITAL  Final   Special Requests BOTTLES DRAWN AEROBIC AND ANAEROBIC 3CC  Final   Culture   Final           BLOOD CULTURE RECEIVED NO GROWTH TO DATE CULTURE WILL BE HELD FOR 5 DAYS BEFORE ISSUING A FINAL NEGATIVE REPORT Performed at [facility]    Report Status PENDING  Incomplete  Blood Culture (routine x 2)     Status: None (Preliminary result)   Collection Time: 06/19/14 12:36 PM  Result Value Ref Range Status   Specimen Description BLOOD LEFT  ANTECUBITAL  Final   Special Requests BOTTLES DRAWN AEROBIC AND ANAEROBIC 5ML  Final   Culture   Final           BLOOD CULTURE RECEIVED NO GROWTH TO DATE CULTURE WILL BE HELD FOR 5 DAYS BEFORE ISSUING A FINAL NEGATIVE REPORT Performed at [facility]    Report Status PENDING  Incomplete  Urine culture     Status: None   Collection Time: 06/19/14  2:32 PM  Result Value Ref Range Status   Specimen Description URINE, CLEAN CATCH  Final   Special Requests NONE  Final   Colony Count   Final    80,000 COLONIES/ML Performed at [facility]    Culture   Final    ESCHERICHIA COLI Performed at [facility]    Report Status 06/21/2014 FINAL  Final   Organism ID, Bacteria ESCHERICHIA COLI  Final      Susceptibility   Escherichia coli - MIC*    AMPICILLIN >=32 RESISTANT Resistant     CEFAZOLIN >=64 RESISTANT Resistant     CEFTRIAXONE <=1 SENSITIVE Sensitive     CIPROFLOXACIN >=4 RESISTANT Resistant      GENTAMICIN <=1 SENSITIVE Sensitive     LEVOFLOXACIN >=8 RESISTANT Resistant     NITROFURANTOIN <=16 SENSITIVE Sensitive     TOBRAMYCIN <=1 SENSITIVE Sensitive     TRIMETH/SULFA >=320 RESISTANT Resistant     PIP/TAZO >=128 RESISTANT Resistant     * ESCHERICHIA COLI  MRSA PCR Screening     Status: None   Collection Time: 06/19/14  8:30 PM  Result Value Ref Range Status   MRSA by PCR NEGATIVE NEGATIVE Final    Comment:        The GeneXpert MRSA Assay (FDA approved for NASAL specimens only), is one component of a comprehensive MRSA colonization surveillance program. It is not intended to diagnose MRSA infection nor to guide or monitor treatment for MRSA infections.   CSF culture     Status: None   Collection Time: 06/20/14 11:33 AM  Result Value Ref Range Status   Specimen Description CSF  Final   Special Requests NONE  Final   Gram Stain   Final    FEW WBC PRESENT, PREDOMINANTLY PMN NO ORGANISMS SEEN Gram Stain Report Called to,Read Back By and Verified With: Gram Stain Report Called to,Read Back By and Verified With: ALLISON DUNDON [id number]@1 :00AM BJENN Performed at [facility]   Final    NO GROWTH 3 DAYS Performed at [facility]    Report Status 06/24/2014 FINAL  Final  Gram stain     Status: None   Collection Time: 06/20/14 11:33 AM  Result Value Ref Range Status   Specimen Description CSF  Final   Special Requests NONE  Final   Gram Stain   Final    CYTOSPIN PREP WBC PRESENT, PREDOMINANTLY PMN NO ORGANISMS SEEN    Report Status 06/20/2014 FINAL  Final    Assessment: She has developed recurrent low-grade fever and diminished mental status again 48 hours after stopping empiric antibiotics. I will repeat blood and urine cultures and restart antibiotics now.  Plan: 1. Repeat blood and urine cultures 2. Restart vancomycin and ceftriaxone 3. Plan discussed with her sister  Michel Bickers, MD [facility] for [place] [phone] pager   [phone] cell 06/24/2014, 3:25 PM

## 2014-06-25 DIAGNOSIS — W19XXXA Unspecified fall, initial encounter: Secondary | ICD-10-CM | POA: Insufficient documentation

## 2014-06-25 DIAGNOSIS — G039 Meningitis, unspecified: Secondary | ICD-10-CM

## 2014-06-25 DIAGNOSIS — W19XXXD Unspecified fall, subsequent encounter: Secondary | ICD-10-CM

## 2014-06-25 DIAGNOSIS — A419 Sepsis, unspecified organism: Secondary | ICD-10-CM | POA: Insufficient documentation

## 2014-06-25 DIAGNOSIS — N390 Urinary tract infection, site not specified: Secondary | ICD-10-CM

## 2014-06-25 LAB — BASIC METABOLIC PANEL
Anion gap: 4 — ABNORMAL LOW (ref 5–15)
CO2: 24 mmol/L (ref 19–32)
Calcium: 8.5 mg/dL (ref 8.4–10.5)
Chloride: 111 mmol/L (ref 96–112)
Creatinine, Ser: 0.91 mg/dL (ref 0.50–1.10)
GFR calc non Af Amer: 62 mL/min — ABNORMAL LOW (ref >90)
GFR, EST AFRICAN AMERICAN: 71 mL/min — AB (ref >90)
GLUCOSE: 159 mg/dL — AB (ref 70–99)
Potassium: 3.1 mmol/L — ABNORMAL LOW (ref 3.5–5.1)
Sodium: 139 mmol/L (ref 135–145)

## 2014-06-25 LAB — GLUCOSE, CAPILLARY
GLUCOSE-CAPILLARY: 122 mg/dL — AB (ref 70–99)
GLUCOSE-CAPILLARY: 123 mg/dL — AB (ref 70–99)
GLUCOSE-CAPILLARY: 131 mg/dL — AB (ref 70–99)
GLUCOSE-CAPILLARY: 156 mg/dL — AB (ref 70–99)
Glucose-Capillary: 151 mg/dL — ABNORMAL HIGH (ref 70–99)
Glucose-Capillary: 196 mg/dL — ABNORMAL HIGH (ref 70–99)

## 2014-06-25 LAB — CULTURE, BLOOD (ROUTINE X 2)
CULTURE: NO GROWTH
Culture: NO GROWTH

## 2014-06-25 LAB — MAGNESIUM: MAGNESIUM: 1.2 mg/dL — AB (ref 1.5–2.5)

## 2014-06-25 MED ORDER — MAGNESIUM SULFATE 2 GM/50ML IV SOLN
INTRAVENOUS | Status: AC
  Administered 2014-06-25: 2 g
  Filled 2014-06-25: qty 50

## 2014-06-25 MED ORDER — POTASSIUM CHLORIDE CRYS ER 20 MEQ PO TBCR: 40.0000 meq | EXTENDED_RELEASE_TABLET | Freq: Two times a day (BID) | ORAL | Status: DC

## 2014-06-25 MED ORDER — MAGNESIUM SULFATE 2 GM/50ML IV SOLN
2.0000 g | Freq: Once | INTRAVENOUS | Status: AC
  Administered 2014-06-25: 2 g via INTRAVENOUS

## 2014-06-25 NOTE — Progress Notes (Signed)
Patient ID: Michelle Farrell, female   DOB: 1941-03-25, 74 y.o.   MRN: [id number]         [facility]    Date of Admission:  06/19/2014   Day 2 vancomycin and ceftriaxone  Principal Problem:   Sepsis Active Problems:   Crohn disease   Pelvic mass   Back pain, chronic   DM (diabetes mellitus)   Acute encephalopathy   UTI (lower urinary tract infection)   Tobacco abuse   CVA (cerebral infarction)   Meningitis   . aspirin EC  325 mg Oral Daily  . cefTRIAXone (ROCEPHIN)  IV  2 g Intravenous Q12H  . cloNIDine  0.1 mg Oral BID  . famotidine  20 mg Oral QHS  . heparin  5,000 Units Subcutaneous 3 times per day  . hydrALAZINE  5 mg Intravenous Q6H  . insulin aspart  0-15 Units Subcutaneous 6 times per day  . lisinopril  10 mg Oral Daily  . nicotine  21 mg Transdermal Daily  . potassium chloride  40 mEq Oral BID  . simvastatin  40 mg Oral q1800  . sodium chloride  3 mL Intravenous Q12H  . spironolactone  25 mg Oral Daily  . vancomycin  750 mg Intravenous Q12H     Past Medical History  Diagnosis Date  . CHF (congestive heart failure)   . Hypertension   . Back pain, chronic   . Hip pain, chronic   . GERD (gastroesophageal reflux disease)   . Crohn disease   . Diabetes mellitus without complication     History  Substance Use Topics  . Smoking status: Current Every Day Smoker  . Smokeless tobacco: Never Used  . Alcohol Use: No    Family History  Problem Relation Age of Onset  . Diabetes Mellitus II Other    No Known Allergies  OBJECTIVE: Blood pressure 121/47, pulse 66, temperature 99.9 F (37.7 C), temperature source Rectal, resp. rate 18, height 5' 6"  (1.676 m), weight 162 lb 4.1 oz (73.6 kg), SpO2 100 %.   General: She is a little more alert today and will try to answer some questions. She is disoriented to place. Skin: No rash Neck: Supple Lungs: Clear Cor: Regular S1 and S2 with no murmur heard Abdomen: Soft.   Lab Results Lab  Results  Component Value Date   WBC 7.5 06/24/2014   HGB 8.6* 06/24/2014   HCT 24.3* 06/24/2014   MCV 85.6 06/24/2014   PLT 175 06/24/2014    Lab Results  Component Value Date   CREATININE 0.91 06/25/2014   BUN <5* 06/25/2014   NA 139 06/25/2014   K 3.1* 06/25/2014   CL 111 06/25/2014   CO2 24 06/25/2014    Lab Results  Component Value Date   ALT 18 06/19/2014   AST 32 06/19/2014   ALKPHOS 65 06/19/2014   BILITOT 0.5 06/19/2014     Microbiology: Recent Results (from the past 240 hour(s))  Blood Culture (routine x 2)     Status: None   Collection Time: 06/19/14 12:23 PM  Result Value Ref Range Status   Specimen Description BLOOD LEFT ANTECUBITAL  Final   Special Requests BOTTLES DRAWN AEROBIC AND ANAEROBIC 3CC  Final   Culture   Final    NO GROWTH 5 DAYS Performed at [facility]    Report Status 06/25/2014 FINAL  Final  Blood Culture (routine x 2)     Status: None   Collection Time: 06/19/14 12:36  PM  Result Value Ref Range Status   Specimen Description BLOOD LEFT ANTECUBITAL  Final   Special Requests BOTTLES DRAWN AEROBIC AND ANAEROBIC 5ML  Final   Culture   Final    NO GROWTH 5 DAYS Performed at [facility]    Report Status 06/25/2014 FINAL  Final  Urine culture     Status: None   Collection Time: 06/19/14  2:32 PM  Result Value Ref Range Status   Specimen Description URINE, CLEAN CATCH  Final   Special Requests NONE  Final   Colony Count   Final    80,000 COLONIES/ML Performed at [facility]    Culture   Final    ESCHERICHIA COLI Performed at [facility]    Report Status 06/21/2014 FINAL  Final   Organism ID, Bacteria ESCHERICHIA COLI  Final      Susceptibility   Escherichia coli - MIC*    AMPICILLIN >=32 RESISTANT Resistant     CEFAZOLIN >=64 RESISTANT Resistant     CEFTRIAXONE <=1 SENSITIVE Sensitive     CIPROFLOXACIN >=4 RESISTANT Resistant     GENTAMICIN <=1 SENSITIVE Sensitive     LEVOFLOXACIN >=8  RESISTANT Resistant     NITROFURANTOIN <=16 SENSITIVE Sensitive     TOBRAMYCIN <=1 SENSITIVE Sensitive     TRIMETH/SULFA >=320 RESISTANT Resistant     PIP/TAZO >=128 RESISTANT Resistant     * ESCHERICHIA COLI  MRSA PCR Screening     Status: None   Collection Time: 06/19/14  8:30 PM  Result Value Ref Range Status   MRSA by PCR NEGATIVE NEGATIVE Final    Comment:        The GeneXpert MRSA Assay (FDA approved for NASAL specimens only), is one component of a comprehensive MRSA colonization surveillance program. It is not intended to diagnose MRSA infection nor to guide or monitor treatment for MRSA infections.   CSF culture     Status: None   Collection Time: 06/20/14 11:33 AM  Result Value Ref Range Status   Specimen Description CSF  Final   Special Requests NONE  Final   Gram Stain   Final    FEW WBC PRESENT, PREDOMINANTLY PMN NO ORGANISMS SEEN Gram Stain Report Called to,Read Back By and Verified With: Gram Stain Report Called to,Read Back By and Verified With: ALLISON DUNDON [id number]@1 :00AM BJENN Performed at [facility]   Final    NO GROWTH 3 DAYS Performed at [facility]    Report Status 06/24/2014 FINAL  Final  Gram stain     Status: None   Collection Time: 06/20/14 11:33 AM  Result Value Ref Range Status   Specimen Description CSF  Final   Special Requests NONE  Final   Gram Stain   Final    CYTOSPIN PREP WBC PRESENT, PREDOMINANTLY PMN NO ORGANISMS SEEN    Report Status 06/20/2014 FINAL  Final    Assessment: She may be a little bit better today after restarting antibiotics for possible Escherichia coli UTI and meningitis.  Plan: 1. Continue vancomycin and ceftriaxone 2. Await final culture results 3. Plan discussed with her granddaughter  Michel Bickers, MD [facility] for [place] [phone] pager   [phone] cell 06/25/2014, 1:21 PM

## 2014-06-25 NOTE — Progress Notes (Addendum)
Speech Language Pathology Treatment: Dysphagia;Cognitive-Linquistic  Patient Details Name: Michelle Farrell MRN: [id number] DOB: [birth year] Today's Date: 06/25/2014 Time: 0301-3143 SLP Time Calculation (min) (ACUTE ONLY): 23 min  Assessment / Plan / Recommendation Clinical Impression  Dysphagia treatment provided today for diet tolerance check and family education. Pt more alert and responsive today. Continues to demonstrate no overt s/s of aspiration with thin liquids or dysphagia 2 consistency. Swallow continues to be timely. Granddaughter at bedside reports no s/s of aspiration observed during meals; however pt has reportedly been not consuming many solids other than puree consistencies. Will continue to follow for diet tolerance.  Cognitive/ linguistic treatment provided today as well, providing verbal/ visual/ tactile cues to encourage expressive language. Pt verbalized "I need to go to the bathroom" independently. Other than that pt had no other verbalizations despite max cues- attempted name and counting. Pt responded "mmm-hmm" to all questions/ verbal prompts today. Attempted object identification; pt would not point or look at the item named from field of 2. Pt put phone to ear appropriately and held/ used cup appropriately but did not grip or hold pen to write despite max cues and multiple attempts. Educated granddaughter on these observations- it appears that pt is having severe difficulties initiating some voluntary motor movements and initiating speech- today some groping observed and lips were pursed together. Also very perseverative again today. Will continue to follow to address cognitive-linguistic skills.     HPI HPI: Michelle Farrell is a 74 y.o. female, with a pmh of heart failure (unspecified), diabetes mellitus, chronic hip and back and leg pain, hypertension, and well-controlled Crohn's disease. She has had 2 recent hospitalizations in the last 4.5 weeks. She was discharged on  February 21 after suffering with nausea, vomiting, diarrhea, and acute renal failure. She was then discharged on March 21 after having nausea, vomiting, diarrhea and acute renal failure. The patient is currently lethargic and not speaking on 06/19/14, consequently history was collected from her granddaughter [name] who is a Designer, jewellery at [employer]. [name] relates that her grandmother was not completely well when she left the hospital on 06/16/14. She has been having low-grade temperatures and odorous urine.  Her grandaughter found her unresponsive on 06/19/14 at her home.       Pertinent Vitals Pain Assessment: Faces Pain Score: 0-No pain Faces Pain Scale: No hurt  SLP Plan  Continue with current plan of care    Recommendations Diet recommendations: Dysphagia 2 (fine chop);Thin liquid Liquids provided via: Cup Medication Administration: Crushed with puree Supervision: Full supervision/cueing for compensatory strategies;Staff to assist with self feeding Compensations: Slow rate;Small sips/bites;Check for pocketing Postural Changes and/or Swallow Maneuvers: Seated upright 90 degrees;Upright 30-60 min after meal              Oral Care Recommendations: Oral care BID Follow up Recommendations: Inpatient Rehab Plan: Continue with current plan of care    GO     Kern Reap, MA, CCC-SLP 06/25/2014, 11:32 AM

## 2014-06-25 NOTE — Progress Notes (Signed)
[facility] PROGRESS NOTE  Michelle Farrell WRU:[id number] DOB: 1940/09/20 DOA: 06/19/2014 PCP: Hermelinda Medicus, MD  Assessment/Plan:  Severe sepsis- patient came with severe sepsis with temperature 102.5 leukocytosis, lactic acidosis and tachypnea with respirations in the 30s. Likely the source is UTI, CSF analysis is not suggestive of infectious etiology. Patient was initially started on vancomycin, ampicillin and Rocephin for possible bacterial meningitis. Neurology had seen patient given concerns of above. WBC down to 11.2. ID was later consulted for possible meningitis, and ID does not feel that she has bacterial meningitis , considering the few wbc in csf and normal protein. ID discontinued the antibiotics 3/27, but developed fever afterwards. ID has since resumed rocephin and vanc as of 3/29. ID recs to continue abx and await cx results.  Acute encephalopathy- slowly resolving, likely from CVA MRI showed acute stroke in posterior medial thalamus. Neurology had been following. Initially on keppra, but later stopped per Neurology recs.   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 2-D echo showed EF 60-65%, no thrombus noted in the echo. Carotid duplex showed 1-39% left ICA stenosis, unable to visualize right ICA due to patient's inability to keep head turned. CIR recommended by PT/OT  Hypokalemia- IV fluids normal saline with 20 of KCl at 75 per hour. Magnesium low at 1.2 - replaced.  Hypomagnesemia- replaced  ? UTI- patient came with abnormal UA. Urine cultures are growing Escherichia coli, ,80,000 colonies. ID initially discontinued rocephin, but has since resumed abx per above given recurrent fever  Hypertension-Resumed home antihypertensive medications.  Diabetes mellitus- patient is currently on sliding-scale insulin. Blood glucose is well controlled.  Acute kidney injury- resolved after IV fluids. Creatinine remains normal.  Code Status: Full Family  Communication: Pt in room, family at bedside (indicate person spoken with, relationship, and if by phone, the number) Disposition Plan: Pending   Consultants:  Neurology  ID  Procedures:    Antibiotics:  Rocephin 3/24- 3/27, resumed 3/29>>>  Vancomycin 3/24- 3/27, resumed 3/29>>>  Ampicillin 3/24- 3/27  HPI/Subjective: No acute events noted. Pt's family reports possible improvement since abx were restarted  Objective: Filed Vitals:   06/25/14 0556 06/25/14 0820 06/25/14 1145 06/25/14 1726  BP: 132/47 145/45 121/47 153/54  Pulse:  68 66 59  Temp:  98.8 F (37.1 C) 99.9 F (37.7 C) 99.4 F (37.4 C)  TempSrc:  Axillary Rectal Axillary  Resp:  22 18 18   Height:      Weight:      SpO2:   100% 100%    Intake/Output Summary (Last 24 hours) at 06/25/14 1748 Last data filed at 06/25/14 0730  Gross per 24 hour  Intake   6670 ml  Output    800 ml  Net   5870 ml   Filed Weights   06/23/14 0555 06/24/14 0400 06/25/14 0400  Weight: 72.575 kg (160 lb) 72.6 kg (160 lb 0.9 oz) 73.6 kg (162 lb 4.1 oz)    Exam:   General:  Asleep, arousable, confused  Cardiovascular: regular, s1, s2  Respiratory: normal resp effort, no wheezing  Abdomen: soft, nondistended  Musculoskeletal: perfused, no clubbing   Data Reviewed: Basic Metabolic Panel:  Recent Labs Lab 06/20/14 0520 06/21/14 0550 06/22/14 0442 06/23/14 0545 06/25/14 0546 06/25/14 1400  NA 137 139 137 144 139  --   K 2.8* 3.4* 3.2* 4.6 3.1*  --   CL 107 107 108 118* 111  --   CO2 22 26 22 21 24   --   GLUCOSE  129* 114* 140* 116* 159*  --   BUN 6 <5* <5* <5* <5*  --   CREATININE 0.98 0.85 0.90 0.93 0.91  --   CALCIUM 9.1 9.3 9.1 8.5 8.5  --   MG  --  1.5  --   --   --  1.2*   Liver Function Tests:  Recent Labs Lab 06/19/14 1234  AST 32  ALT 18  ALKPHOS 65  BILITOT 0.5  PROT 7.0  ALBUMIN 3.8   No results for input(s): LIPASE, AMYLASE in the last 168 hours. No results for input(s): AMMONIA in  the last 168 hours. CBC:  Recent Labs Lab 06/19/14 1234 06/20/14 0520 06/21/14 0550 06/23/14 0545 06/24/14 1500  WBC 16.1* 15.2* 11.2* 8.2 7.5  NEUTROABS 13.5*  --   --   --   --   HGB 12.5 11.2* 9.8* 8.2* 8.6*  HCT 35.8* 31.9* 28.5* 23.5* 24.3*  MCV 88.0 86.7 86.9 87.4 85.6  PLT 204 180 167 151 175   Cardiac Enzymes:  Recent Labs Lab 06/19/14 2023  CKTOTAL 624*   BNP (last 3 results) No results for input(s): BNP in the last 8760 hours.  ProBNP (last 3 results)  Recent Labs  08/27/13 2205 08/28/13 0454  PROBNP 263.1* 251.0*    CBG:  Recent Labs Lab 06/25/14 0011 06/25/14 0419 06/25/14 0816 06/25/14 1128 06/25/14 1644  GLUCAP 122* 123* 131* 151* 156*    Recent Results (from the past 240 hour(s))  Blood Culture (routine x 2)     Status: None   Collection Time: 06/19/14 12:23 PM  Result Value Ref Range Status   Specimen Description BLOOD LEFT ANTECUBITAL  Final   Special Requests BOTTLES DRAWN AEROBIC AND ANAEROBIC 3CC  Final   Culture   Final    NO GROWTH 5 DAYS Performed at [facility]    Report Status 06/25/2014 FINAL  Final  Blood Culture (routine x 2)     Status: None   Collection Time: 06/19/14 12:36 PM  Result Value Ref Range Status   Specimen Description BLOOD LEFT ANTECUBITAL  Final   Special Requests BOTTLES DRAWN AEROBIC AND ANAEROBIC 5ML  Final   Culture   Final    NO GROWTH 5 DAYS Performed at [facility]    Report Status 06/25/2014 FINAL  Final  Urine culture     Status: None   Collection Time: 06/19/14  2:32 PM  Result Value Ref Range Status   Specimen Description URINE, CLEAN CATCH  Final   Special Requests NONE  Final   Colony Count   Final    80,000 COLONIES/ML Performed at [facility]    Culture   Final    ESCHERICHIA COLI Performed at [facility]    Report Status 06/21/2014 FINAL  Final   Organism ID, Bacteria ESCHERICHIA COLI  Final      Susceptibility   Escherichia coli - MIC*     AMPICILLIN >=32 RESISTANT Resistant     CEFAZOLIN >=64 RESISTANT Resistant     CEFTRIAXONE <=1 SENSITIVE Sensitive     CIPROFLOXACIN >=4 RESISTANT Resistant     GENTAMICIN <=1 SENSITIVE Sensitive     LEVOFLOXACIN >=8 RESISTANT Resistant     NITROFURANTOIN <=16 SENSITIVE Sensitive     TOBRAMYCIN <=1 SENSITIVE Sensitive     TRIMETH/SULFA >=320 RESISTANT Resistant     PIP/TAZO >=128 RESISTANT Resistant     * ESCHERICHIA COLI  MRSA PCR Screening     Status: None  Collection Time: 06/19/14  8:30 PM  Result Value Ref Range Status   MRSA by PCR NEGATIVE NEGATIVE Final    Comment:        The GeneXpert MRSA Assay (FDA approved for NASAL specimens only), is one component of a comprehensive MRSA colonization surveillance program. It is not intended to diagnose MRSA infection nor to guide or monitor treatment for MRSA infections.   CSF culture     Status: None   Collection Time: 06/20/14 11:33 AM  Result Value Ref Range Status   Specimen Description CSF  Final   Special Requests NONE  Final   Gram Stain   Final    FEW WBC PRESENT, PREDOMINANTLY PMN NO ORGANISMS SEEN Gram Stain Report Called to,Read Back By and Verified With: Gram Stain Report Called to,Read Back By and Verified With: ALLISON DUNDON [id number]@1 :00AM BJENN Performed at [facility]   Final    NO GROWTH 3 DAYS Performed at [facility]    Report Status 06/24/2014 FINAL  Final  Gram stain     Status: None   Collection Time: 06/20/14 11:33 AM  Result Value Ref Range Status   Specimen Description CSF  Final   Special Requests NONE  Final   Gram Stain   Final    CYTOSPIN PREP WBC PRESENT, PREDOMINANTLY PMN NO ORGANISMS SEEN    Report Status 06/20/2014 FINAL  Final     Studies: No results found.  Scheduled Meds: . aspirin EC  325 mg Oral Daily  . cefTRIAXone (ROCEPHIN)  IV  2 g Intravenous Q12H  . cloNIDine  0.1 mg Oral BID  . famotidine  20 mg Oral QHS  . heparin  5,000 Units  Subcutaneous 3 times per day  . hydrALAZINE  5 mg Intravenous Q6H  . insulin aspart  0-15 Units Subcutaneous 6 times per day  . lisinopril  10 mg Oral Daily  . simvastatin  40 mg Oral q1800  . sodium chloride  3 mL Intravenous Q12H  . spironolactone  25 mg Oral Daily  . vancomycin  750 mg Intravenous Q12H   Continuous Infusions: . sodium chloride 0.9 % 1,000 mL with potassium chloride 20 mEq infusion 75 mL/hr at 06/25/14 0154    Principal Problem:   Sepsis Active Problems:   Crohn disease   Pelvic mass   Back pain, chronic   DM (diabetes mellitus)   Acute encephalopathy   UTI (lower urinary tract infection)   Tobacco abuse   CVA (cerebral infarction)   Meningitis   Sircharles Holzheimer K  [facility] Pager [phone]. If 7PM-7AM, please contact night-coverage at [email or web address], password [email or web address] 06/25/2014, 5:48 PM  LOS: 6 days

## 2014-06-25 NOTE — Progress Notes (Signed)
Inpatient Rehabilitation  I met with pt and granddaughter [name] at the bedside this am.  Pt. currently with decreased arousal and low grade fevers.  Blood and urine cultures pending and antibiotics have been restarted.  [name] is leaning heavily toward IP Rehab (vs. SNF) but plans to visit 2 SNFs today to know all her options.  If [name] elects for IP Rehab, plan will be to admit tomorrow IF pt. medically ready and bed availability allows.  I updated [facility] SW of the plan. Please call if questions.  [place] Admissions Coordinator Cell [phone] Office [phone]

## 2014-06-25 NOTE — PMR Pre-admission (Signed)
PMR Admission Coordinator Pre-Admission Assessment  Patient: Michelle Farrell is an 74 y.o., female MRN: [id number] DOB: [birth year] Height: 5' 6"  (167.6 cm) Weight: 73.6 kg (162 lb 4.1 oz)              Insurance Information HMO: yes    PPO:      PCP:      IPA:      80/20:      OTHER:  PRIMARY:  [employer] Medicare      Policy#:  [id number]      Subscriber:  self CM Name:  Katrina      Phone#: [phone] ext. [phone]  Fax#: [phone] Pre-Cert#: [id number]      Employer:  Retired to be followed by Colbert Coyer at [phone] Benefits:  Phone #:  [phone]     Name: online and Jetta Lout. Date:  03/28/14     Deduct:  $0      Out of Pocket Max:  [phone]      Life Max:  none CIR:  $430 per day, days 1-4 only; $0, day 5 and beyond      SNF:  $0 days 1-20; $160 days 21-62; $0 days 63-100 Outpatient:  100%      Co-Pay:  $40 copay Home Health: 100%      Co-Pay:  DME:  80%/20%     Co-Pay:  Providers:  In network   Medicaid Application Date:       Case Manager:  Disability Application Date:       Case Worker:   Emergency Facilities manager Information    Name Relation Home Work Mobile   Lindsay Grandaughter [phone]       Current Medical History  Patient Admitting Diagnosis: right thalamic infarct  History of Present Illness: Michelle Farrell is a 74 y.o. right handed female with history of Crohn's disease, tobacco abuse, diastolic congestive heart failure, hypertension, diabetes mellitus with peripheral neuropathy. Patient with recent admission 06/11/2014 -06/16/2014 for nausea vomiting, AKI related to Crohn's disease. Presented 06/19/2014 after being found on the floor by her granddaughter with altered mental status and aphasia. Cranial CT scan negative for acute changes. CT cervical spine negative. MRI of the brain showed acute/subacute 10 mm nonhemorrhagic infarct within the posterior medial thalamus as well as remote medial left occipital lobe infarct. Hospital workup with urine  culture greater than 80,000 Escherichia coli, blood cultures negative. White blood cell count 16,100. Echocardiogram with ejection fraction of 46% grade 1 diastolic dysfunction. EEG showed moderately severe generalized nonspecific slowing without seizure. Carotid Dopplers with no left ICA stenosis. Unable to visualize right ICA secondary to patient's inability to keep head turned. Lumbar puncture negative. Patient did not receive TPA. Patient off antibiotics for 48 hours with a change in mental status noted along with low grade fevers.  Per ID, antibiotics restarted 06/24/14.  Blood and urine cultures pending.  Patient had issues with lethargy and keppra d/c 3/17. Pt had recurrent fevers and lethargy 48 hrs past stopping empiric antibiotics and she was pan cultured with resumption of vancomycin and Ceftriaxone. Stools negative for c diff. I  With Dr. Tommy Medal will continue to follow patient while in CIR.  Pt with hypokalemia with IVF normal saline with 20 kcl at 75 cc per hour. Magnesium replaced.   Placed on aspirin for CVA prophylaxis. Subcutaneous heparin for DVT prophylaxis. Presently on a dysphagia 2 nectar liquid diet. Physical and occupational therapy evaluations completed. M.D. has requested physical medicine rehabilitation consult. Patient was admitted  for comprehensive rehabilitation program   Total: 18 [facility]    Past Medical History  Past Medical History  Diagnosis Date  . CHF (congestive heart failure)   . Hypertension   . Back pain, chronic   . Hip pain, chronic   . GERD (gastroesophageal reflux disease)   . Crohn disease   . Diabetes mellitus without complication   . Esophageal stricture     s/p dilation 2003, 2008  . PUD (peptic ulcer disease)   . CKD (chronic kidney disease)   . COPD (chronic obstructive pulmonary disease)     Family History  family history includes Diabetes Mellitus II in her other.  Prior Rehab/Hospitalizations: Pt. Had just begun receiving HHPT prior to  this admission.  She has had 2 recent hospitalizations.     Current Medications   Current facility-administered medications:  .  acetaminophen (TYLENOL) tablet 650 mg, 650 mg, Oral, Q6H PRN **OR** acetaminophen (TYLENOL) suppository 650 mg, 650 mg, Rectal, Q6H PRN, Bobby Rumpf York, PA-C, 650 mg at 06/24/14 1419 .  albuterol (PROVENTIL) (2.5 MG/3ML) 0.083% nebulizer solution 2.5 mg, 2.5 mg, Inhalation, Q6H PRN, Melton Alar, PA-C .  alum & mag hydroxide-simeth (MAALOX/MYLANTA) 200-200-20 MG/5ML suspension 30 mL, 30 mL, Oral, Q6H PRN, Bobby Rumpf York, PA-C .  aspirin EC tablet 325 mg, 325 mg, Oral, Daily, Oswald Hillock, MD, 325 mg at 06/27/14 0854 .  bisacodyl (DULCOLAX) suppository 10 mg, 10 mg, Rectal, Daily PRN, Bobby Rumpf York, PA-C .  cefTRIAXone (ROCEPHIN) 2 g in dextrose 5 % 50 mL IVPB - Premix, 2 g, Intravenous, Q12H, Michel Bickers, MD, 2 g at 06/27/14 0546 .  cloNIDine (CATAPRES) tablet 0.1 mg, 0.1 mg, Oral, BID, Oswald Hillock, MD, 0.1 mg at 06/27/14 0854 .  famotidine (PEPCID) tablet 20 mg, 20 mg, Oral, QHS, Oswald Hillock, MD, 20 mg at 06/26/14 2220 .  heparin injection 5,000 Units, 5,000 Units, Subcutaneous, 3 times per day, Greta Doom, MD, 5,000 Units at 06/27/14 0546 .  hydrALAZINE (APRESOLINE) injection 10 mg, 10 mg, Intravenous, Q6H PRN, Melton Alar, PA-C, 10 mg at 06/19/14 2328 .  hydrALAZINE (APRESOLINE) injection 5 mg, 5 mg, Intravenous, Q6H, Theodis Blaze, MD, 5 mg at 06/27/14 0547 .  insulin aspart (novoLOG) injection 0-15 Units, 0-15 Units, Subcutaneous, 6 times per day, Melton Alar, PA-C, 2 Units at 06/27/14 [phone]  lisinopril (PRINIVIL,ZESTRIL) tablet 10 mg, 10 mg, Oral, Daily, Oswald Hillock, MD, 10 mg at 06/27/14 0853 .  naloxone [facility]) injection 0.4 mg, 0.4 mg, Intravenous, PRN, Melton Alar, PA-C, 0.4 mg at 06/19/14 1757 .  ondansetron (ZOFRAN) tablet 4 mg, 4 mg, Oral, Q6H PRN **OR** ondansetron (ZOFRAN) injection 4 mg, 4 mg, Intravenous, Q6H PRN,  Melton Alar, PA-C .  RESOURCE THICKENUP CLEAR, , Oral, PRN, Oswald Hillock, MD .  simvastatin (ZOCOR) tablet 40 mg, 40 mg, Oral, q1800, David L Rinehuls, PA-C, 40 mg at 06/26/14 1711 .  sodium chloride 0.9 % injection 10-40 mL, 10-40 mL, Intracatheter, PRN, Theodis Blaze, MD, 10 mL at 06/27/14 0547 .  sodium chloride 0.9 % injection 3 mL, 3 mL, Intravenous, Q12H, Marianne L York, PA-C, 3 mL at 06/27/14 0854 .  spironolactone (ALDACTONE) tablet 25 mg, 25 mg, Oral, Daily, Oswald Hillock, MD, 25 mg at 06/27/14 0853 .  vancomycin (VANCOCIN) IVPB 750 mg/150 ml premix, 750 mg, Intravenous, Q12H, Franky Macho, RPH, 750 mg at 06/27/14 0403  Patients Current Diet: DIET  DYS 2 Room service appropriate?: Yes with Assist; Fluid consistency:: Thin  Precautions / Restrictions Precautions Precautions: Fall Precaution Comments: Continues to have some urgency with urination and stilll impulsive with OOB Restrictions Weight Bearing Restrictions: No   Prior Activity Level Limited Community (1-2x/wk): Pt. able to drive PTA.  Goes out to [employer], MD appointments and church .  Is usually out of the home 2-3x/week   [place] Devices/Equipment: Environmental consultant (specify type), Dentures (specify type), Cane (specify quad or straight), CBG Meter, Eyeglasses Home Equipment: Cane - single point, Environmental consultant - 2 wheels  Prior Functional Level Prior Function Level of Independence: Independent Comments: has RW and cane but doesn't use per daughter  Current Functional Level Cognition  Arousal/Alertness: Lethargic Overall Cognitive Status: Impaired/Different from baseline Difficult to assess due to: Level of arousal Current Attention Level: Selective Orientation Level: Oriented to person, Other (comment) Following Commands: Follows one step commands inconsistently Safety/Judgement: Decreased awareness of safety, Decreased awareness of deficits General Comments: Pt is trying to  sit initially but apparenlty was convfused about BSC thinking she should sit every time she saw it. Attention: Sustained Sustained Attention: Impaired Sustained Attention Impairment: Verbal basic, Functional basic Memory: Impaired Awareness: Impaired Problem Solving: Impaired Problem Solving Impairment: Verbal basic, Functional basic Behaviors: Restless, Physical agitation, Verbal agitation, Impulsive Safety/Judgment: Impaired    Extremity Assessment (includes Sensation/Coordination)  Upper Extremity Assessment: Generalized weakness, Difficult to assess due to impaired cognition  Lower Extremity Assessment: Defer to PT evaluation    ADLs  Overall ADL's : Needs assistance/impaired Grooming: Wash/dry face, Moderate assistance, Sitting Grooming Details (indicate cue type and reason): hand over hand assist. VC's for sequencing and commands.  Upper Body Bathing: Maximal assistance, Sitting Lower Body Bathing: Maximal assistance, Sit to/from stand Upper Body Dressing : Maximal assistance, Sitting Lower Body Dressing: Maximal assistance, Sit to/from stand, Total assistance Toilet Transfer: Minimal assistance, Stand-pivot, BSC Toilet Transfer Details (indicate cue type and reason): min A to balance and direct actions.  Functional mobility during ADLs: Min guard, Minimal assistance General ADL Comments: Pt continues to have difficulty following commands. Pt responded "Yeah" when asked if she needed to use the bathroom. Pt get to EOB and required min A to stand. Once standing, pt appeared not to know what she was doing. Directed pt to sit on [facility] and pt turned and instead sat on bed stating "I don't have to use the bathroom." Again when asked "Do you need to use the bathroom?" Pt stated "Yeah." Pt sat EOB and worked on following commands. Pt is perseverative and repeating actions ("touch your nose") and having difficulty moving on from actions.     Mobility  Overal bed mobility: Needs  Assistance Bed Mobility: Supine to Sit, Sit to Supine Rolling: Min assist, Min guard Sidelying to sit: Max assist Supine to sit: Min guard Sit to supine: Min guard General bed mobility comments: No physical assist needed but min guard for safety. Increased time required.     Transfers  Overall transfer level: Needs assistance Equipment used: 1 person hand held assist Transfers: Sit to/from Stand, Stand Pivot Transfers Sit to Stand: Min assist Stand pivot transfers: Min assist General transfer comment: VC's for sequencing. Pt with decreased activity tolerance.     Ambulation / Gait / Stairs / Wheelchair Mobility  Ambulation/Gait Ambulation/Gait assistance: Museum/gallery curator (Feet): 90 Feet Assistive device: 1 person hand held assist (IV pole) Gait Pattern/deviations: Step-through pattern, Shuffle, Wide base of support, Drifts right/left  Gait velocity: slow Gait velocity interpretation: Below normal speed for age/gender General Gait Details: more fluid pattern today but limited endurance    Posture / Balance Dynamic Sitting Balance Sitting balance - Comments: SIts with pt touching bedrail or end of bed but no LOB Balance Overall balance assessment: Needs assistance Sitting-balance support: Feet supported Sitting balance-Leahy Scale: Good Sitting balance - Comments: SIts with pt touching bedrail or end of bed but no LOB Postural control: Posterior lean Standing balance support: Single extremity supported Standing balance-Leahy Scale: Fair Standing balance comment: fair- dynamic    Special needs/care consideration BiPAP/CPAP   no CPM   no Continuous Drip IV  NS with 20 KCL at 75 cc per hour Dialysis   no         Life Vest   no Oxygen  no Special Bed   no Trach Size   No  Wound Vac (area)   no       Skin  Granddaughter denies                               Bowel mgmt: last BM 06/27/14 Bladder mgmt: continent with urinary urgency since UTI, using BSC with  assist Diabetic mgmt  Yes Hgb A1C 5.3   Previous Home Environment Living Arrangements: Alone, Other relatives  Lives With: Alone Available Help at Discharge: Family, Other (Comment) (granddaughter works from home, can arrange 24 hour care) Type of Home: Apartment Home Layout: One level Home Access: Stairs to enter Technical brewer of Steps: 3 Bathroom Shower/Tub: Chiropodist: Standard Home Care Services: No  Discharge Living Setting Plans for Discharge Living Setting: House Type of Home at Discharge: House Discharge Home Layout: Two level, 1/2 bath on main level, Other (Comment) (granddaughter will arrange for pt. to sleep on main level) Alternate Level Stairs-Number of Steps: flight Discharge Home Access: Stairs to enter Entrance Stairs-Rails: Right, Left Entrance Stairs-Number of Steps: 4-5 Discharge Bathroom Shower/Tub: Tub/shower unit, Walk-in shower, Other (comment) (on second level; half bath on main level) Discharge Bathroom Toilet: Standard Discharge Bathroom Accessibility: Yes How Accessible: Accessible via walker Does the patient have any problems obtaining your medications?: No  Social/Family/Support Systems Patient Roles: Other (Comment) (Pt. and granddaughter are very close) Anticipated Caregiver: Gypsy Lore, granddaughter Anticipated Caregiver's Contact Information: [phone] Ability/Limitations of Caregiver: [name] works from home.  She states she plans to enlist help from family and hire assist during the several hours during the day that her work is most intense. Caregiver Availability: 24/7 Discharge Plan Discussed with Primary Caregiver: Yes Is Caregiver In Agreement with Plan?: Yes Does Caregiver/Family have Issues with Lodging/Transportation while Pt is in Rehab?: No  Granddaughter, Evvana Providence Lanius), is a Designer, jewellery  Goals/Additional Needs Patient/Family Goal for Rehab: supervision for PT/OT; supervision and min  assist for SLP Expected length of stay: 10-18 days Cultural Considerations: none per granddaughter Equipment Needs: TBA Pt/Family Agrees to Admission and willing to participate: Yes Program Orientation Provided & Reviewed with Pt/Caregiver Including Roles  & Responsibilities: Yes  Decrease burden of Care through IP rehab admission: no   Possible need for SNF placement upon discharge:  Not anticipated  Patient Condition: This patient's medical and functional status has changed since the consult dated: 06/23/2014 in which the Rehabilitation Physician determined and documented that the patient's condition is appropriate for intensive rehabilitative care in an inpatient rehabilitation facility. See "History of Present Illness" (above) for medical update. Functional changes  are: min to mod assist with mac cueing. Patient's medical and functional status update has been discussed with the Rehabilitation physician and patient remains appropriate for inpatient rehabilitation. Will admit to inpatient rehab today.  Preadmission Screen Completed By:  Cleatrice Burke, 06/27/2014 1:40 PM ______________________________________________________________________   Discussed status with Dr. Naaman Plummer on 06/27/2014 at 1340 and received telephone approval for admission today.  Admission Coordinator:  Cleatrice Burke, time 7824  Date 06/27/2014

## 2014-06-26 DIAGNOSIS — R410 Disorientation, unspecified: Secondary | ICD-10-CM

## 2014-06-26 DIAGNOSIS — IMO0001 Reserved for inherently not codable concepts without codable children: Secondary | ICD-10-CM | POA: Insufficient documentation

## 2014-06-26 DIAGNOSIS — R4182 Altered mental status, unspecified: Secondary | ICD-10-CM | POA: Insufficient documentation

## 2014-06-26 LAB — COMPREHENSIVE METABOLIC PANEL
ALT: 35 U/L (ref 0–35)
AST: 27 U/L (ref 0–37)
Albumin: 2.5 g/dL — ABNORMAL LOW (ref 3.5–5.2)
Alkaline Phosphatase: 41 U/L (ref 39–117)
Anion gap: 7 (ref 5–15)
BILIRUBIN TOTAL: 0.5 mg/dL (ref 0.3–1.2)
BUN: <5 mg/dL — ABNORMAL LOW (ref 6–23)
CHLORIDE: 112 mmol/L (ref 96–112)
CO2: 19 mmol/L (ref 19–32)
CREATININE: 0.87 mg/dL (ref 0.50–1.10)
Calcium: 8.7 mg/dL (ref 8.4–10.5)
GFR calc Af Amer: 75 mL/min — ABNORMAL LOW (ref >90)
GFR calc non Af Amer: 65 mL/min — ABNORMAL LOW (ref >90)
GLUCOSE: 144 mg/dL — AB (ref 70–99)
Potassium: 3.1 mmol/L — ABNORMAL LOW (ref 3.5–5.1)
SODIUM: 138 mmol/L (ref 135–145)
Total Protein: 4.9 g/dL — ABNORMAL LOW (ref 6.0–8.3)

## 2014-06-26 LAB — CBC
HEMATOCRIT: 23.6 % — AB (ref 36.0–46.0)
HEMOGLOBIN: 8.3 g/dL — AB (ref 12.0–15.0)
MCH: 30.3 pg (ref 26.0–34.0)
MCHC: 35.2 g/dL (ref 30.0–36.0)
MCV: 86.1 fL (ref 78.0–100.0)
Platelets: 155 10*3/uL (ref 150–400)
RBC: 2.74 MIL/uL — ABNORMAL LOW (ref 3.87–5.11)
RDW: 15 % (ref 11.5–15.5)
WBC: 6.2 10*3/uL (ref 4.0–10.5)

## 2014-06-26 LAB — GLUCOSE, CAPILLARY
GLUCOSE-CAPILLARY: 116 mg/dL — AB (ref 70–99)
Glucose-Capillary: 113 mg/dL — ABNORMAL HIGH (ref 70–99)
Glucose-Capillary: 120 mg/dL — ABNORMAL HIGH (ref 70–99)
Glucose-Capillary: 137 mg/dL — ABNORMAL HIGH (ref 70–99)
Glucose-Capillary: 167 mg/dL — ABNORMAL HIGH (ref 70–99)
Glucose-Capillary: 102 mg/dL — ABNORMAL HIGH (ref 70–99)

## 2014-06-26 LAB — URINE CULTURE
COLONY COUNT: NO GROWTH
Culture: NO GROWTH
Special Requests: NORMAL

## 2014-06-26 LAB — MAGNESIUM: Magnesium: 1.8 mg/dL (ref 1.5–2.5)

## 2014-06-26 MED ORDER — MAGNESIUM SULFATE 2 GM/50ML IV SOLN
2.0000 g | Freq: Once | INTRAVENOUS | Status: AC
  Administered 2014-06-26: 2 g via INTRAVENOUS
  Filled 2014-06-26: qty 50

## 2014-06-26 NOTE — Progress Notes (Signed)
Patient ID: Michelle Farrell, female   DOB: 12-22-1940, 74 y.o.   MRN: [id number]         [facility] for Infectious Disease    Date of Admission:  06/19/2014   Day 3 vancomycin and ceftriaxone  Principal Problem:   Sepsis Active Problems:   Crohn disease   Pelvic mass   Back pain, chronic   DM (diabetes mellitus)   Acute encephalopathy   UTI (lower urinary tract infection)   Tobacco abuse   CVA (cerebral infarction)   Meningitis   Fall   Sepsis secondary to UTI   . aspirin EC  325 mg Oral Daily  . cefTRIAXone (ROCEPHIN)  IV  2 g Intravenous Q12H  . cloNIDine  0.1 mg Oral BID  . famotidine  20 mg Oral QHS  . heparin  5,000 Units Subcutaneous 3 times per day  . hydrALAZINE  5 mg Intravenous Q6H  . insulin aspart  0-15 Units Subcutaneous 6 times per day  . lisinopril  10 mg Oral Daily  . simvastatin  40 mg Oral q1800  . sodium chloride  3 mL Intravenous Q12H  . spironolactone  25 mg Oral Daily  . vancomycin  750 mg Intravenous Q12H     Past Medical History  Diagnosis Date  . CHF (congestive heart failure)   . Hypertension   . Back pain, chronic   . Hip pain, chronic   . GERD (gastroesophageal reflux disease)   . Crohn disease   . Diabetes mellitus without complication     History  Substance Use Topics  . Smoking status: Current Every Day Smoker  . Smokeless tobacco: Never Used  . Alcohol Use: No    Family History  Problem Relation Age of Onset  . Diabetes Mellitus II Other    No Known Allergies  OBJECTIVE: Blood pressure 145/62, pulse 59, temperature 98.1 F (36.7 C), temperature source Oral, resp. rate 15, height 5' 6"  (1.676 m), weight 162 lb 4.1 oz (73.6 kg), SpO2 100 %.   General: She is a little more alert today. She is able to indicate when she needs to go to the bathroom. Her granddaughter states that she still answer some questions inappropriately but she has more verbal today. She is currently working with PT and is able to get up and get on  the bedside toilet with minimal assistance.   Lab Results Lab Results  Component Value Date   WBC 6.2 06/26/2014   HGB 8.3* 06/26/2014   HCT 23.6* 06/26/2014   MCV 86.1 06/26/2014   PLT 155 06/26/2014    Lab Results  Component Value Date   CREATININE 0.87 06/26/2014   BUN <5* 06/26/2014   NA 138 06/26/2014   K 3.1* 06/26/2014   CL 112 06/26/2014   CO2 19 06/26/2014    Lab Results  Component Value Date   ALT 35 06/26/2014   AST 27 06/26/2014   ALKPHOS 41 06/26/2014   BILITOT 0.5 06/26/2014     Microbiology: Recent Results (from the past 240 hour(s))  Blood Culture (routine x 2)     Status: None   Collection Time: 06/19/14 12:23 PM  Result Value Ref Range Status   Specimen Description BLOOD LEFT ANTECUBITAL  Final   Special Requests BOTTLES DRAWN AEROBIC AND ANAEROBIC 3CC  Final   Culture   Final    NO GROWTH 5 DAYS Performed at [facility]    Report Status 06/25/2014 FINAL  Final  Blood Culture (routine x 2)  Status: None   Collection Time: 06/19/14 12:36 PM  Result Value Ref Range Status   Specimen Description BLOOD LEFT ANTECUBITAL  Final   Special Requests BOTTLES DRAWN AEROBIC AND ANAEROBIC 5ML  Final   Culture   Final    NO GROWTH 5 DAYS Performed at [facility]    Report Status 06/25/2014 FINAL  Final  Urine culture     Status: None   Collection Time: 06/19/14  2:32 PM  Result Value Ref Range Status   Specimen Description URINE, CLEAN CATCH  Final   Special Requests NONE  Final   Colony Count   Final    80,000 COLONIES/ML Performed at [facility]    Culture   Final    ESCHERICHIA COLI Performed at [facility]    Report Status 06/21/2014 FINAL  Final   Organism ID, Bacteria ESCHERICHIA COLI  Final      Susceptibility   Escherichia coli - MIC*    AMPICILLIN >=32 RESISTANT Resistant     CEFAZOLIN >=64 RESISTANT Resistant     CEFTRIAXONE <=1 SENSITIVE Sensitive     CIPROFLOXACIN >=4 RESISTANT Resistant      GENTAMICIN <=1 SENSITIVE Sensitive     LEVOFLOXACIN >=8 RESISTANT Resistant     NITROFURANTOIN <=16 SENSITIVE Sensitive     TOBRAMYCIN <=1 SENSITIVE Sensitive     TRIMETH/SULFA >=320 RESISTANT Resistant     PIP/TAZO >=128 RESISTANT Resistant     * ESCHERICHIA COLI  MRSA PCR Screening     Status: None   Collection Time: 06/19/14  8:30 PM  Result Value Ref Range Status   MRSA by PCR NEGATIVE NEGATIVE Final    Comment:        The GeneXpert MRSA Assay (FDA approved for NASAL specimens only), is one component of a comprehensive MRSA colonization surveillance program. It is not intended to diagnose MRSA infection nor to guide or monitor treatment for MRSA infections.   CSF culture     Status: None   Collection Time: 06/20/14 11:33 AM  Result Value Ref Range Status   Specimen Description CSF  Final   Special Requests NONE  Final   Gram Stain   Final    FEW WBC PRESENT, PREDOMINANTLY PMN NO ORGANISMS SEEN Gram Stain Report Called to,Read Back By and Verified With: Gram Stain Report Called to,Read Back By and Verified With: ALLISON DUNDON [id number]@1 :00AM BJENN Performed at [facility]   Final    NO GROWTH 3 DAYS Performed at [facility]    Report Status 06/24/2014 FINAL  Final  Gram stain     Status: None   Collection Time: 06/20/14 11:33 AM  Result Value Ref Range Status   Specimen Description CSF  Final   Special Requests NONE  Final   Gram Stain   Final    CYTOSPIN PREP WBC PRESENT, PREDOMINANTLY PMN NO ORGANISMS SEEN    Report Status 06/20/2014 FINAL  Final  Culture, blood (routine x 2)     Status: None (Preliminary result)   Collection Time: 06/24/14  6:44 PM  Result Value Ref Range Status   Specimen Description BLOOD LEFT HAND  Final   Special Requests BOTTLES DRAWN AEROBIC ONLY 3CC  Final   Culture   Final           BLOOD CULTURE RECEIVED NO GROWTH TO DATE CULTURE WILL BE HELD FOR 5 DAYS BEFORE ISSUING A FINAL NEGATIVE REPORT Performed  at [facility]  Report Status PENDING  Incomplete  Culture, blood (routine x 2)     Status: None (Preliminary result)   Collection Time: 06/24/14  6:53 PM  Result Value Ref Range Status   Specimen Description BLOOD LEFT HAND  Final   Special Requests BOTTLES DRAWN AEROBIC AND ANAEROBIC 5CC  Final   Culture   Final           BLOOD CULTURE RECEIVED NO GROWTH TO DATE CULTURE WILL BE HELD FOR 5 DAYS BEFORE ISSUING A FINAL NEGATIVE REPORT Performed at [facility]    Report Status PENDING  Incomplete  Culture, Urine     Status: None   Collection Time: 06/24/14  7:50 PM  Result Value Ref Range Status   Specimen Description URINE, RANDOM  Final   Special Requests Normal  Final   Colony Count NO GROWTH Performed at [facility]   Final   Culture NO GROWTH Performed at [facility]   Final   Report Status 06/26/2014 FINAL  Final    Assessment: She is improving back on empiric antibiotic therapy for Escherichia coli UTI and probable meningitis.  Plan: 1. Continue vancomycin and ceftriaxone 2. Plan discussed with her granddaughter  Michel Bickers, MD [facility] [phone] pager   [phone] cell 06/26/2014, 11:05 AM

## 2014-06-26 NOTE — Progress Notes (Signed)
Occupational Therapy Treatment Patient Details Name: Michelle Farrell MRN: [id number] DOB: Jun 29, 1940 Today's Date: 06/26/2014    History of present illness 74 y.o. female with a history of Crohn's disease, Diastolic CHF, HTN, IDDM, and Hyperlipidemia, COPD, neuropathy, chronic back pain admitted with nausea, vomiting, diarrhea, and periods of unresponsiveness.  MRI showed left dorsal BG infarct.  Work up continues   OT comments  Pt continues to require frequent VC's for sequencing and presents with decreased cognition, decreased awareness of safety and deficits, and perseveration. Pt will continue to benefit from acute OT and is an excellent CIR candidate to progress to Supervision level to return home with family.    Follow Up Recommendations  CIR;Supervision/Assistance - 24 hour    Equipment Recommendations  Other (comment) (defer to next venue)    Recommendations for Other Services      Precautions / Restrictions Precautions Precautions: Fall Precaution Comments: Continues to have some urgency with urination and stilll impulsive with OOB Restrictions Weight Bearing Restrictions: No       Mobility Bed Mobility Overal bed mobility: Needs Assistance Bed Mobility: Supine to Sit;Sit to Supine     Supine to sit: Min guard Sit to supine: Min guard   General bed mobility comments: No physical assist needed but min guard for safety. Increased time required.   Transfers Overall transfer level: Needs assistance Equipment used: 1 person hand held assist Transfers: Sit to/from [facility] Sit to Stand: Min assist Stand pivot transfers: Min assist       General transfer comment: VC's for sequencing. Pt with decreased activity tolerance.         ADL Overall ADL's : Needs assistance/impaired     Grooming: Wash/dry face;Moderate assistance;Sitting Grooming Details (indicate cue type and reason): hand over hand assist. VC's for sequencing and commands.                   Toilet Transfer: Minimal Production assistant, radio Details (indicate cue type and reason): min A to balance and direct actions.            General ADL Comments: Pt continues to have difficulty following commands. Pt responded "Yeah" when asked if she needed to use the bathroom. Pt get to EOB and required min A to stand. Once standing, pt appeared not to know what she was doing. Directed pt to sit on [facility] and pt turned and instead sat on bed stating "I don't have to use the bathroom." Again when asked "Do you need to use the bathroom?" Pt stated "Yeah." Pt sat EOB and worked on following commands. Pt is perseverative and repeating actions ("touch your nose") and having difficulty moving on from actions.                 Cognition  Arousal/Alertness: Awake/Alert Behavior During Therapy: Flat affect Overall Cognitive Status: Impaired/Different from baseline Area of Impairment: Safety/judgement;Following commands;Problem solving   Current Attention Level: Selective Memory: Decreased recall of precautions;Decreased short-term memory  Following Commands: Follows one step commands inconsistently Safety/Judgement: Decreased awareness of safety;Decreased awareness of deficits Awareness: Intellectual Problem Solving: Slow processing;Decreased initiation                   Pertinent Vitals/ Pain       Pain Assessment: Faces Faces Pain Scale: No hurt         Frequency Min 2X/week     Progress Toward Goals  OT Goals(current goals can now be found in the care plan section)  Progress towards OT goals: Progressing toward goals  Acute Rehab OT Goals Patient Stated Goal: none  Plan Discharge plan remains appropriate       End of Session Equipment Utilized During Treatment: Gait belt   Activity Tolerance Patient limited by fatigue   Patient Left in bed;with call bell/phone within reach;with bed alarm set   Nurse Communication           Time: 3015-9968 OT Time Calculation (min): 24 min  Charges: OT General Charges $OT Visit: 1 Procedure OT Treatments $Self Care/Home Management : 23-37 mins  Juluis Rainier 06/26/2014, 4:05 PM  Cyndie Chime, OTR/L Occupational Therapist [phone] (pager)

## 2014-06-26 NOTE — Progress Notes (Signed)
[facility] PROGRESS NOTE  Michelle Farrell JKD:[id number] DOB: March 15, 1941 DOA: 06/19/2014 PCP: Hermelinda Medicus, MD  Assessment/Plan:  Severe sepsis- patient came with severe sepsis with temperature 102.5 leukocytosis, lactic acidosis and tachypnea with respirations in the 30s. Likely the source is UTI, CSF analysis is not suggestive of infectious etiology. Patient was initially started on vancomycin, ampicillin and Rocephin for possible bacterial meningitis. Neurology had seen patient given concerns of above. WBC down to 11.2. ID was later consulted for possible meningitis, and ID does not feel that she has bacterial meningitis , considering the few wbc in csf and normal protein. ID discontinued the antibiotics 3/27, but developed fever afterwards. ID has since resumed rocephin and vanc as of 3/29. ID recs to continue abx and await cx results.  Some clinical improvement noted overnight, as noted by family. ID recs to continue current abx regimen   Acute encephalopathy- slowly resolving, likely from CVA MRI showed acute stroke in posterior medial thalamus. Neurology had been following. Initially on keppra, but later stopped per Neurology recs.  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 2-D echo showed EF 60-65%, no thrombus noted in the echo. Carotid duplex showed 1-39% left ICA stenosis, unable to visualize right ICA due to patient's inability to keep head turned. CIR recommended by PT/OT  CIR recs to admit to rehab once medically stable   Hypokalemia- IV fluids normal saline with 20 of KCl at 75 per hour. Magnesium replaced   Hypomagnesemia- replaced   ? UTI- patient came with abnormal UA. Urine cultures are growing Escherichia coli, ,80,000 colonies. ID initially discontinued rocephin, but has since resumed abx per above given recurrent fever, per above   Hypertension-Resumed home antihypertensive medications.   Diabetes  mellitus- patient is currently on sliding-scale insulin. Blood glucose is well controlled.   Acute kidney injury- resolved after IV fluids. Creatinine remains in normal range.  Code Status: Full Family Communication: Pt in room, family at bedside Disposition Plan: Pending possible CIR when stable   Consultants:  Neurology  ID  Procedures:    Antibiotics:  Rocephin 3/24- 3/27, resumed 3/29>>>  Vancomycin 3/24- 3/27, resumed 3/29>>>  Ampicillin 3/24- 3/27  HPI/Subjective: No acute events noted. Family reports pt is more alert and interactive  Objective: Filed Vitals:   06/26/14 0847 06/26/14 0957 06/26/14 1126 06/26/14 1211  BP: 141/48 145/62 130/50 112/40  Pulse: 59   56  Temp: 98.1 F (36.7 C)   98.9 F (37.2 C)  TempSrc: Oral   Oral  Resp: 15   17  Height:      Weight:      SpO2: 100%   100%    Intake/Output Summary (Last 24 hours) at 06/26/14 1419 Last data filed at 06/26/14 1324  Gross per 24 hour  Intake   2061 ml  Output    400 ml  Net   1661 ml   Filed Weights   06/23/14 0555 06/24/14 0400 06/25/14 0400  Weight: 72.575 kg (160 lb) 72.6 kg (160 lb 0.9 oz) 73.6 kg (162 lb 4.1 oz)    Exam:   General:  Awake, interactive, mildly confused, laying in bed  Cardiovascular: regular, s1, s2  Respiratory: normal resp effort, no wheezing  Abdomen: soft, nondistended  Musculoskeletal: perfused, no clubbing   Data Reviewed: Basic Metabolic Panel:  Recent Labs Lab 06/21/14 0550 06/22/14 0442 06/23/14 0545 06/25/14 0546 06/25/14 1400 06/26/14 0454  NA 139 137 144 139  --  138  K 3.4* 3.2* 4.6 3.1*  --  3.1*  CL 107 108 118* 111  --  112  CO2 26 22 21 24   --  19  GLUCOSE 114* 140* 116* 159*  --  144*  BUN <5* <5* <5* <5*  --  <5*  CREATININE 0.85 0.90 0.93 0.91  --  0.87  CALCIUM 9.3 9.1 8.5 8.5  --  8.7  MG 1.5  --   --   --  1.2* 1.8   Liver Function Tests:  Recent Labs Lab 06/26/14 0454  AST 27  ALT 35  ALKPHOS 41  BILITOT  0.5  PROT 4.9*  ALBUMIN 2.5*   No results for input(s): LIPASE, AMYLASE in the last 168 hours. No results for input(s): AMMONIA in the last 168 hours. CBC:  Recent Labs Lab 06/20/14 0520 06/21/14 0550 06/23/14 0545 06/24/14 1500 06/26/14 0454  WBC 15.2* 11.2* 8.2 7.5 6.2  HGB 11.2* 9.8* 8.2* 8.6* 8.3*  HCT 31.9* 28.5* 23.5* 24.3* 23.6*  MCV 86.7 86.9 87.4 85.6 86.1  PLT 180 167 151 175 155   Cardiac Enzymes:  Recent Labs Lab 06/19/14 2023  CKTOTAL 624*   BNP (last 3 results) No results for input(s): BNP in the last 8760 hours.  ProBNP (last 3 results)  Recent Labs  08/27/13 2205 08/28/13 0454  PROBNP 263.1* 251.0*    CBG:  Recent Labs Lab 06/25/14 1957 06/26/14 0215 06/26/14 0418 06/26/14 0821 06/26/14 1208  GLUCAP 196* 113* 120* 137* 116*    Recent Results (from the past 240 hour(s))  Blood Culture (routine x 2)     Status: None   Collection Time: 06/19/14 12:23 PM  Result Value Ref Range Status   Specimen Description BLOOD LEFT ANTECUBITAL  Final   Special Requests BOTTLES DRAWN AEROBIC AND ANAEROBIC 3CC  Final   Culture   Final    NO GROWTH 5 DAYS Performed at [facility]    Report Status 06/25/2014 FINAL  Final  Blood Culture (routine x 2)     Status: None   Collection Time: 06/19/14 12:36 PM  Result Value Ref Range Status   Specimen Description BLOOD LEFT ANTECUBITAL  Final   Special Requests BOTTLES DRAWN AEROBIC AND ANAEROBIC 5ML  Final   Culture   Final    NO GROWTH 5 DAYS Performed at [facility]    Report Status 06/25/2014 FINAL  Final  Urine culture     Status: None   Collection Time: 06/19/14  2:32 PM  Result Value Ref Range Status   Specimen Description URINE, CLEAN CATCH  Final   Special Requests NONE  Final   Colony Count   Final    80,000 COLONIES/ML Performed at [facility]    Culture   Final    ESCHERICHIA COLI Performed at [facility]    Report Status 06/21/2014 FINAL  Final    Organism ID, Bacteria ESCHERICHIA COLI  Final      Susceptibility   Escherichia coli - MIC*    AMPICILLIN >=32 RESISTANT Resistant     CEFAZOLIN >=64 RESISTANT Resistant     CEFTRIAXONE <=1 SENSITIVE Sensitive     CIPROFLOXACIN >=4 RESISTANT Resistant     GENTAMICIN <=1 SENSITIVE Sensitive     LEVOFLOXACIN >=8 RESISTANT Resistant     NITROFURANTOIN <=16 SENSITIVE Sensitive     TOBRAMYCIN <=1 SENSITIVE Sensitive     TRIMETH/SULFA >=320 RESISTANT Resistant     PIP/TAZO >=128 RESISTANT Resistant     *  ESCHERICHIA COLI  MRSA PCR Screening     Status: None   Collection Time: 06/19/14  8:30 PM  Result Value Ref Range Status   MRSA by PCR NEGATIVE NEGATIVE Final    Comment:        The GeneXpert MRSA Assay (FDA approved for NASAL specimens only), is one component of a comprehensive MRSA colonization surveillance program. It is not intended to diagnose MRSA infection nor to guide or monitor treatment for MRSA infections.   CSF culture     Status: None   Collection Time: 06/20/14 11:33 AM  Result Value Ref Range Status   Specimen Description CSF  Final   Special Requests NONE  Final   Gram Stain   Final    FEW WBC PRESENT, PREDOMINANTLY PMN NO ORGANISMS SEEN Gram Stain Report Called to,Read Back By and Verified With: Gram Stain Report Called to,Read Back By and Verified With: ALLISON DUNDON [id number]@1 :00AM BJENN Performed at [facility]   Final    NO GROWTH 3 DAYS Performed at [facility]    Report Status 06/24/2014 FINAL  Final  Gram stain     Status: None   Collection Time: 06/20/14 11:33 AM  Result Value Ref Range Status   Specimen Description CSF  Final   Special Requests NONE  Final   Gram Stain   Final    CYTOSPIN PREP WBC PRESENT, PREDOMINANTLY PMN NO ORGANISMS SEEN    Report Status 06/20/2014 FINAL  Final  Culture, blood (routine x 2)     Status: None (Preliminary result)   Collection Time: 06/24/14  6:44 PM  Result Value Ref Range  Status   Specimen Description BLOOD LEFT HAND  Final   Special Requests BOTTLES DRAWN AEROBIC ONLY 3CC  Final   Culture   Final           BLOOD CULTURE RECEIVED NO GROWTH TO DATE CULTURE WILL BE HELD FOR 5 DAYS BEFORE ISSUING A FINAL NEGATIVE REPORT Performed at [facility]    Report Status PENDING  Incomplete  Culture, blood (routine x 2)     Status: None (Preliminary result)   Collection Time: 06/24/14  6:53 PM  Result Value Ref Range Status   Specimen Description BLOOD LEFT HAND  Final   Special Requests BOTTLES DRAWN AEROBIC AND ANAEROBIC 5CC  Final   Culture   Final           BLOOD CULTURE RECEIVED NO GROWTH TO DATE CULTURE WILL BE HELD FOR 5 DAYS BEFORE ISSUING A FINAL NEGATIVE REPORT Performed at [facility]    Report Status PENDING  Incomplete  Culture, Urine     Status: None   Collection Time: 06/24/14  7:50 PM  Result Value Ref Range Status   Specimen Description URINE, RANDOM  Final   Special Requests Normal  Final   Colony Count NO GROWTH Performed at [facility]   Final   Culture NO GROWTH Performed at [facility]   Final   Report Status 06/26/2014 FINAL  Final     Studies: No results found.  Scheduled Meds: . aspirin EC  325 mg Oral Daily  . cefTRIAXone (ROCEPHIN)  IV  2 g Intravenous Q12H  . cloNIDine  0.1 mg Oral BID  . famotidine  20 mg Oral QHS  . heparin  5,000 Units Subcutaneous 3 times per day  . hydrALAZINE  5 mg Intravenous Q6H  . insulin aspart  0-15 Units Subcutaneous  6 times per day  . lisinopril  10 mg Oral Daily  . simvastatin  40 mg Oral q1800  . sodium chloride  3 mL Intravenous Q12H  . spironolactone  25 mg Oral Daily  . vancomycin  750 mg Intravenous Q12H   Continuous Infusions: . sodium chloride 0.9 % 1,000 mL with potassium chloride 20 mEq infusion 75 mL/hr at 06/25/14 0154    Principal Problem:   Sepsis Active Problems:   Crohn disease   Pelvic mass   Back pain, chronic   DM (diabetes  mellitus)   Acute encephalopathy   UTI (lower urinary tract infection)   Tobacco abuse   CVA (cerebral infarction)   Meningitis   Fall   Sepsis secondary to UTI   [facility], [facility] Pager [phone]. If 7PM-7AM, please contact night-coverage at [email or web address], password [email or web address] 06/26/2014, 2:19 PM  LOS: 7 days

## 2014-06-26 NOTE — Progress Notes (Signed)
Physical Therapy Treatment Patient Details Name: Michelle Farrell MRN: [id number] DOB: 1940-05-09 Today's Date: 06/26/2014    History of Present Illness 74 y.o. female with a history of Crohn's disease, Diastolic CHF, HTN, IDDM, and Hyperlipidemia, COPD, neuropathy, chronic back pain admitted with nausea, vomiting, diarrhea, and periods of unresponsiveness.  MRI showed left dorsal BG infarct.  Work up continues    PT Comments    Pt continues on with increasing gait endurance but is not safe in her gait to allow discharge home in next couple days.  Her plan for inpt therapy is very appropriate as pt has been living alone and is not as cognitively clear as prior to hosp per her granddaughter.  Follow Up Recommendations  CIR;Supervision/Assistance - 24 hour     Equipment Recommendations  None recommended by PT    Recommendations for Other Services Rehab consult     Precautions / Restrictions Precautions Precautions: Fall Precaution Comments: Continues to have some urgency with urination and stilll impulsive with OOB Restrictions Weight Bearing Restrictions: No    Mobility  Bed Mobility Overal bed mobility: Needs Assistance Bed Mobility: Supine to Sit Rolling: Min assist;Min guard   Supine to sit: Min assist;Min guard     General bed mobility comments: Sleepy but then motivated to get onto [facility]  Transfers Overall transfer level: Needs assistance Equipment used: 1 person hand held assist Transfers: Sit to/from [facility] Sit to Stand: Min guard;Min assist Stand pivot transfers: Min guard;Min assist       General transfer comment: reminders for plan of action and then pt follows with bief  tolerance for the effort to initiate  Ambulation/Gait Ambulation/Gait assistance: Min assist Ambulation Distance (Feet): 90 Feet Assistive device: 1 person hand held assist (IV pole) Gait Pattern/deviations: Step-through pattern;Shuffle;Wide base of support;Drifts  right/left Gait velocity: slow Gait velocity interpretation: Below normal speed for age/gender General Gait Details: more fluid pattern today but limited endurance   Stairs            Wheelchair Mobility    Modified Rankin (Stroke Patients Only)       Balance Overall balance assessment: Needs assistance Sitting-balance support: Feet supported Sitting balance-Leahy Scale: Good Sitting balance - Comments: SIts with pt touching bedrail or end of bed but no LOB Postural control: Posterior lean Standing balance support: Single extremity supported Standing balance-Leahy Scale: Fair Standing balance comment: fair- dynamic                    Cognition Arousal/Alertness: Lethargic Behavior During Therapy: Flat affect Overall Cognitive Status: Impaired/Different from baseline Area of Impairment: Safety/judgement;Following commands;Problem solving   Current Attention Level: Alternating Memory: Decreased recall of precautions;Decreased short-term memory Following Commands: Follows one step commands inconsistently Safety/Judgement: Decreased awareness of safety;Decreased awareness of deficits   Problem Solving: Slow processing;Decreased initiation General Comments: Pt is trying to sit initially but apparenlty was convfused about BSC thinking she should sit every time she saw it.    Exercises      General Comments General comments (skin integrity, edema, etc.): Pt is with family today and has good support network, but is not going to be safe at home      Pertinent Vitals/Pain Pain Assessment: Faces Pain Score: 0-No pain Faces Pain Scale: No hurt    Home Living                      Prior Function  PT Goals (current goals can now be found in the care plan section) Acute Rehab PT Goals Patient Stated Goal: none Progress towards PT goals: Progressing toward goals    Frequency  Min 3X/week    PT Plan Current plan remains appropriate     Co-evaluation             End of Session Equipment Utilized During Treatment: Gait belt Activity Tolerance: Patient limited by lethargy Patient left: in chair;with call bell/phone within reach;with chair alarm set;with family/visitor present     Time: 2536-6440 PT Time Calculation (min) (ACUTE ONLY): 31 min  Charges:  $Gait Training: 8-22 mins $Therapeutic Activity: 8-22 mins                    G Codes:      Ramond Dial July 01, 2014, 11:44 AM   Mee Hives, PT MS Acute Rehab Dept. Number: [phone]

## 2014-06-26 NOTE — Progress Notes (Signed)
Pt up in chair after working with therapy. Functional improvement since 3/29 session. If continues to improve, would like to consider inpt rehab admission tomorrow if medically ready per MD. I will follow up tomorrow. [phone]

## 2014-06-27 ENCOUNTER — Encounter (HOSPITAL_COMMUNITY): Payer: Self-pay

## 2014-06-27 ENCOUNTER — Encounter (HOSPITAL_COMMUNITY): Payer: Self-pay | Admitting: Physical Medicine and Rehabilitation

## 2014-06-27 ENCOUNTER — Inpatient Hospital Stay (HOSPITAL_COMMUNITY)
Admission: AD | Admit: 2014-06-27 | Discharge: 2014-07-07 | DRG: 056 | Disposition: A | Discharge status: Other Acute Inpatient Hospital | Payer: Medicare Other | Source: Intra-hospital | Attending: Physical Medicine & Rehabilitation | Admitting: Physical Medicine & Rehabilitation

## 2014-06-27 DIAGNOSIS — N179 Acute kidney failure, unspecified: Secondary | ICD-10-CM | POA: Diagnosis not present

## 2014-06-27 DIAGNOSIS — N189 Chronic kidney disease, unspecified: Secondary | ICD-10-CM | POA: Diagnosis present

## 2014-06-27 DIAGNOSIS — I10 Essential (primary) hypertension: Secondary | ICD-10-CM | POA: Diagnosis present

## 2014-06-27 DIAGNOSIS — G8929 Other chronic pain: Secondary | ICD-10-CM | POA: Diagnosis present

## 2014-06-27 DIAGNOSIS — I5033 Acute on chronic diastolic (congestive) heart failure: Secondary | ICD-10-CM | POA: Diagnosis not present

## 2014-06-27 DIAGNOSIS — G9340 Encephalopathy, unspecified: Secondary | ICD-10-CM | POA: Diagnosis not present

## 2014-06-27 DIAGNOSIS — I639 Cerebral infarction, unspecified: Secondary | ICD-10-CM | POA: Diagnosis present

## 2014-06-27 DIAGNOSIS — N390 Urinary tract infection, site not specified: Secondary | ICD-10-CM | POA: Diagnosis present

## 2014-06-27 DIAGNOSIS — E860 Dehydration: Secondary | ICD-10-CM | POA: Diagnosis not present

## 2014-06-27 DIAGNOSIS — I69398 Other sequelae of cerebral infarction: Principal | ICD-10-CM

## 2014-06-27 DIAGNOSIS — J449 Chronic obstructive pulmonary disease, unspecified: Secondary | ICD-10-CM | POA: Diagnosis present

## 2014-06-27 DIAGNOSIS — E876 Hypokalemia: Secondary | ICD-10-CM | POA: Diagnosis not present

## 2014-06-27 DIAGNOSIS — K219 Gastro-esophageal reflux disease without esophagitis: Secondary | ICD-10-CM | POA: Diagnosis present

## 2014-06-27 DIAGNOSIS — E1165 Type 2 diabetes mellitus with hyperglycemia: Secondary | ICD-10-CM | POA: Diagnosis present

## 2014-06-27 DIAGNOSIS — I6330 Cerebral infarction due to thrombosis of unspecified cerebral artery: Secondary | ICD-10-CM | POA: Diagnosis present

## 2014-06-27 DIAGNOSIS — R410 Disorientation, unspecified: Secondary | ICD-10-CM | POA: Diagnosis not present

## 2014-06-27 DIAGNOSIS — I6931 Cognitive deficits following cerebral infarction: Secondary | ICD-10-CM | POA: Diagnosis not present

## 2014-06-27 DIAGNOSIS — E872 Acidosis: Secondary | ICD-10-CM | POA: Diagnosis not present

## 2014-06-27 DIAGNOSIS — I5030 Unspecified diastolic (congestive) heart failure: Secondary | ICD-10-CM | POA: Diagnosis present

## 2014-06-27 DIAGNOSIS — F17210 Nicotine dependence, cigarettes, uncomplicated: Secondary | ICD-10-CM | POA: Diagnosis present

## 2014-06-27 DIAGNOSIS — W19XXXA Unspecified fall, initial encounter: Secondary | ICD-10-CM

## 2014-06-27 DIAGNOSIS — A419 Sepsis, unspecified organism: Secondary | ICD-10-CM | POA: Diagnosis present

## 2014-06-27 DIAGNOSIS — R509 Fever, unspecified: Secondary | ICD-10-CM | POA: Diagnosis not present

## 2014-06-27 DIAGNOSIS — G9349 Other encephalopathy: Secondary | ICD-10-CM | POA: Diagnosis present

## 2014-06-27 DIAGNOSIS — R1110 Vomiting, unspecified: Secondary | ICD-10-CM | POA: Diagnosis not present

## 2014-06-27 DIAGNOSIS — R001 Bradycardia, unspecified: Secondary | ICD-10-CM | POA: Diagnosis present

## 2014-06-27 DIAGNOSIS — K50919 Crohn's disease, unspecified, with unspecified complications: Secondary | ICD-10-CM | POA: Diagnosis not present

## 2014-06-27 DIAGNOSIS — K920 Hematemesis: Secondary | ICD-10-CM | POA: Diagnosis not present

## 2014-06-27 DIAGNOSIS — I129 Hypertensive chronic kidney disease with stage 1 through stage 4 chronic kidney disease, or unspecified chronic kidney disease: Secondary | ICD-10-CM | POA: Diagnosis present

## 2014-06-27 DIAGNOSIS — R1115 Cyclical vomiting syndrome unrelated to migraine: Secondary | ICD-10-CM

## 2014-06-27 DIAGNOSIS — K5090 Crohn's disease, unspecified, without complications: Secondary | ICD-10-CM | POA: Diagnosis not present

## 2014-06-27 DIAGNOSIS — R102 Pelvic and perineal pain: Secondary | ICD-10-CM

## 2014-06-27 DIAGNOSIS — D62 Acute posthemorrhagic anemia: Secondary | ICD-10-CM | POA: Diagnosis present

## 2014-06-27 DIAGNOSIS — I63339 Cerebral infarction due to thrombosis of unspecified posterior cerebral artery: Secondary | ICD-10-CM | POA: Diagnosis not present

## 2014-06-27 DIAGNOSIS — G009 Bacterial meningitis, unspecified: Secondary | ICD-10-CM | POA: Diagnosis present

## 2014-06-27 DIAGNOSIS — R4182 Altered mental status, unspecified: Secondary | ICD-10-CM

## 2014-06-27 DIAGNOSIS — D649 Anemia, unspecified: Secondary | ICD-10-CM | POA: Diagnosis present

## 2014-06-27 DIAGNOSIS — R933 Abnormal findings on diagnostic imaging of other parts of digestive tract: Secondary | ICD-10-CM | POA: Diagnosis not present

## 2014-06-27 DIAGNOSIS — R110 Nausea: Secondary | ICD-10-CM | POA: Diagnosis not present

## 2014-06-27 LAB — BASIC METABOLIC PANEL
ANION GAP: 6 (ref 5–15)
Anion gap: 8 (ref 5–15)
BUN: <5 mg/dL — ABNORMAL LOW (ref 6–23)
BUN: <5 mg/dL — ABNORMAL LOW (ref 6–23)
CALCIUM: 9.4 mg/dL (ref 8.4–10.5)
CO2: 19 mmol/L (ref 19–32)
CO2: 20 mmol/L (ref 19–32)
Calcium: 8.1 mg/dL — ABNORMAL LOW (ref 8.4–10.5)
Chloride: 100 mmol/L (ref 96–112)
Chloride: 111 mmol/L (ref 96–112)
Creatinine, Ser: 0.8 mg/dL (ref 0.50–1.10)
Creatinine, Ser: 0.92 mg/dL (ref 0.50–1.10)
GFR calc Af Amer: 83 mL/min — ABNORMAL LOW (ref >90)
GFR calc Af Amer: 70 mL/min — ABNORMAL LOW (ref >90)
GFR calc non Af Amer: 72 mL/min — ABNORMAL LOW (ref >90)
GFR calc non Af Amer: 61 mL/min — ABNORMAL LOW (ref >90)
GLUCOSE: 173 mg/dL — AB (ref 70–99)
Glucose, Bld: 547 mg/dL — ABNORMAL HIGH (ref 70–99)
POTASSIUM: 3.8 mmol/L (ref 3.5–5.1)
Potassium: 2.9 mmol/L — ABNORMAL LOW (ref 3.5–5.1)
SODIUM: 125 mmol/L — AB (ref 135–145)
SODIUM: 139 mmol/L (ref 135–145)

## 2014-06-27 LAB — CBC
HEMATOCRIT: 22.6 % — AB (ref 36.0–46.0)
HEMOGLOBIN: 8.1 g/dL — AB (ref 12.0–15.0)
MCH: 30.8 pg (ref 26.0–34.0)
MCHC: 35.8 g/dL (ref 30.0–36.0)
MCV: 85.9 fL (ref 78.0–100.0)
Platelets: 160 10*3/uL (ref 150–400)
RBC: 2.63 MIL/uL — AB (ref 3.87–5.11)
RDW: 15 % (ref 11.5–15.5)
WBC: 5.6 10*3/uL (ref 4.0–10.5)

## 2014-06-27 LAB — CLOSTRIDIUM DIFFICILE BY PCR: CDIFFPCR: NEGATIVE

## 2014-06-27 LAB — GLUCOSE, CAPILLARY
GLUCOSE-CAPILLARY: 125 mg/dL — AB (ref 70–99)
Glucose-Capillary: 125 mg/dL — ABNORMAL HIGH (ref 70–99)
Glucose-Capillary: 125 mg/dL — ABNORMAL HIGH (ref 70–99)
Glucose-Capillary: 133 mg/dL — ABNORMAL HIGH (ref 70–99)
Glucose-Capillary: 137 mg/dL — ABNORMAL HIGH (ref 70–99)
Glucose-Capillary: 169 mg/dL — ABNORMAL HIGH (ref 70–99)

## 2014-06-27 LAB — MAGNESIUM: MAGNESIUM: 1.8 mg/dL (ref 1.5–2.5)

## 2014-06-27 LAB — VANCOMYCIN, TROUGH: Vancomycin Tr: 17.6 ug/mL (ref 10.0–20.0)

## 2014-06-27 MED ORDER — FAMOTIDINE 20 MG PO TABS
20.0000 mg | ORAL_TABLET | Freq: Every day | ORAL | Status: DC
  Administered 2014-06-27 – 2014-07-06 (×10): 20 mg via ORAL
  Filled 2014-06-27 (×11): qty 1

## 2014-06-27 MED ORDER — PROCHLORPERAZINE 25 MG RE SUPP: 12.5000 mg | Freq: Four times a day (QID) | RECTAL | Status: DC | PRN

## 2014-06-27 MED ORDER — CLONIDINE HCL 0.1 MG PO TABS
0.1000 mg | ORAL_TABLET | Freq: Two times a day (BID) | ORAL | Status: DC
  Administered 2014-06-27 – 2014-07-05 (×16): 0.1 mg via ORAL
  Filled 2014-06-27 (×18): qty 1

## 2014-06-27 MED ORDER — PROCHLORPERAZINE MALEATE 5 MG PO TABS
5.0000 mg | ORAL_TABLET | Freq: Four times a day (QID) | ORAL | Status: DC | PRN
  Filled 2014-06-27: qty 2

## 2014-06-27 MED ORDER — ONDANSETRON HCL 4 MG PO TABS: 4.0000 mg | ORAL_TABLET | Freq: Four times a day (QID) | ORAL | Status: DC | PRN

## 2014-06-27 MED ORDER — LISINOPRIL 10 MG PO TABS
10.0000 mg | ORAL_TABLET | Freq: Every day | ORAL | Status: DC
  Administered 2014-06-28 – 2014-07-04 (×7): 10 mg via ORAL
  Filled 2014-06-27 (×8): qty 1

## 2014-06-27 MED ORDER — CEFTRIAXONE SODIUM IN DEXTROSE 40 MG/ML IV SOLN: 2.0000 g | Freq: Two times a day (BID) | INTRAVENOUS | Status: DC

## 2014-06-27 MED ORDER — ONDANSETRON HCL 4 MG/2ML IJ SOLN
4.0000 mg | Freq: Four times a day (QID) | INTRAMUSCULAR | Status: DC | PRN
  Administered 2014-07-06 – 2014-07-07 (×2): 4 mg via INTRAVENOUS
  Filled 2014-06-27 (×2): qty 2

## 2014-06-27 MED ORDER — VANCOMYCIN HCL IN DEXTROSE 750-5 MG/150ML-% IV SOLN: 750.0000 mg | Freq: Two times a day (BID) | INTRAVENOUS | Status: DC

## 2014-06-27 MED ORDER — PROCHLORPERAZINE EDISYLATE 5 MG/ML IJ SOLN
5.0000 mg | Freq: Four times a day (QID) | INTRAMUSCULAR | Status: DC | PRN
  Administered 2014-07-07: 10 mg via INTRAMUSCULAR
  Filled 2014-06-27: qty 2

## 2014-06-27 MED ORDER — ENOXAPARIN SODIUM 40 MG/0.4ML ~~LOC~~ SOLN
40.0000 mg | SUBCUTANEOUS | Status: DC
  Administered 2014-06-27 – 2014-07-03 (×7): 40 mg via SUBCUTANEOUS
  Filled 2014-06-27 (×8): qty 0.4

## 2014-06-27 MED ORDER — INSULIN ASPART 100 UNIT/ML ~~LOC~~ SOLN
0.0000 [IU] | Freq: Every day | SUBCUTANEOUS | Status: DC
  Administered 2014-07-02: 2 [IU] via SUBCUTANEOUS

## 2014-06-27 MED ORDER — POTASSIUM CHLORIDE CRYS ER 20 MEQ PO TBCR
40.0000 meq | EXTENDED_RELEASE_TABLET | Freq: Two times a day (BID) | ORAL | Status: DC
  Administered 2014-06-27: 40 meq via ORAL
  Filled 2014-06-27: qty 2

## 2014-06-27 MED ORDER — BISACODYL 10 MG RE SUPP: 10.0000 mg | Freq: Every day | RECTAL | Status: DC | PRN

## 2014-06-27 MED ORDER — ASPIRIN 325 MG PO TBEC: 325.0000 mg | DELAYED_RELEASE_TABLET | Freq: Every day | ORAL | Status: DC

## 2014-06-27 MED ORDER — FAMOTIDINE 20 MG PO TABS: 20.0000 mg | ORAL_TABLET | Freq: Every day | ORAL | Status: DC

## 2014-06-27 MED ORDER — MAGNESIUM SULFATE 2 GM/50ML IV SOLN
2.0000 g | Freq: Once | INTRAVENOUS | Status: AC
  Administered 2014-06-27: 2 g via INTRAVENOUS
  Filled 2014-06-27: qty 50

## 2014-06-27 MED ORDER — ACETAMINOPHEN 325 MG PO TABS
325.0000 mg | ORAL_TABLET | ORAL | Status: DC | PRN
  Administered 2014-07-04: 650 mg via ORAL
  Filled 2014-06-27: qty 2

## 2014-06-27 MED ORDER — ASPIRIN EC 325 MG PO TBEC
325.0000 mg | DELAYED_RELEASE_TABLET | Freq: Every day | ORAL | Status: DC
  Administered 2014-06-28 – 2014-06-30 (×3): 325 mg via ORAL
  Filled 2014-06-27 (×5): qty 1

## 2014-06-27 MED ORDER — INSULIN ASPART 100 UNIT/ML ~~LOC~~ SOLN
0.0000 [IU] | Freq: Three times a day (TID) | SUBCUTANEOUS | Status: DC
  Administered 2014-06-28 – 2014-06-29 (×2): 2 [IU] via SUBCUTANEOUS
  Administered 2014-06-30: 1 [IU] via SUBCUTANEOUS
  Administered 2014-07-01: 2 [IU] via SUBCUTANEOUS
  Administered 2014-07-02: 1 [IU] via SUBCUTANEOUS
  Administered 2014-07-02: 2 [IU] via SUBCUTANEOUS
  Administered 2014-07-02 – 2014-07-06 (×11): 1 [IU] via SUBCUTANEOUS
  Administered 2014-07-07: 2 [IU] via SUBCUTANEOUS

## 2014-06-27 MED ORDER — SENNOSIDES-DOCUSATE SODIUM 8.6-50 MG PO TABS
1.0000 | ORAL_TABLET | Freq: Every evening | ORAL | Status: DC | PRN
  Filled 2014-06-27: qty 1

## 2014-06-27 MED ORDER — SODIUM CHLORIDE 0.9 % IJ SOLN
10.0000 mL | INTRAMUSCULAR | Status: DC | PRN
  Administered 2014-06-28 – 2014-06-29 (×2): 20 mL
  Administered 2014-07-01 – 2014-07-03 (×3): 10 mL
  Administered 2014-07-03 – 2014-07-06 (×5): 20 mL
  Filled 2014-06-27 (×10): qty 40

## 2014-06-27 MED ORDER — SPIRONOLACTONE 25 MG PO TABS
25.0000 mg | ORAL_TABLET | Freq: Every day | ORAL | Status: DC
  Administered 2014-06-28 – 2014-07-04 (×7): 25 mg via ORAL
  Filled 2014-06-27 (×8): qty 1

## 2014-06-27 MED ORDER — ALUM & MAG HYDROXIDE-SIMETH 200-200-20 MG/5ML PO SUSP
30.0000 mL | ORAL | Status: DC | PRN
  Administered 2014-07-06: 30 mL via ORAL
  Filled 2014-06-27: qty 30

## 2014-06-27 MED ORDER — GUAIFENESIN-DM 100-10 MG/5ML PO SYRP: 5.0000 mL | ORAL_SOLUTION | Freq: Four times a day (QID) | ORAL | Status: DC | PRN

## 2014-06-27 MED ORDER — CEFTRIAXONE SODIUM IN DEXTROSE 40 MG/ML IV SOLN
2.0000 g | Freq: Two times a day (BID) | INTRAVENOUS | Status: DC
  Administered 2014-06-27 – 2014-07-07 (×20): 2 g via INTRAVENOUS
  Filled 2014-06-27 (×21): qty 50

## 2014-06-27 MED ORDER — ALUM & MAG HYDROXIDE-SIMETH 200-200-20 MG/5ML PO SUSP: 30.0000 mL | Freq: Four times a day (QID) | ORAL | Status: DC | PRN

## 2014-06-27 MED ORDER — FLEET ENEMA 7-19 GM/118ML RE ENEM: 1.0000 | ENEMA | Freq: Once | RECTAL | Status: AC | PRN

## 2014-06-27 MED ORDER — VANCOMYCIN HCL IN DEXTROSE 750-5 MG/150ML-% IV SOLN
750.0000 mg | Freq: Two times a day (BID) | INTRAVENOUS | Status: DC
  Administered 2014-06-27 – 2014-07-04 (×13): 750 mg via INTRAVENOUS
  Filled 2014-06-27 (×17): qty 150

## 2014-06-27 MED ORDER — TRAZODONE HCL 50 MG PO TABS
25.0000 mg | ORAL_TABLET | Freq: Every evening | ORAL | Status: DC | PRN
  Administered 2014-06-29 – 2014-07-05 (×6): 50 mg via ORAL
  Filled 2014-06-27 (×6): qty 1

## 2014-06-27 MED ORDER — SIMVASTATIN 40 MG PO TABS
40.0000 mg | ORAL_TABLET | Freq: Every day | ORAL | Status: DC
  Administered 2014-06-27 – 2014-07-06 (×10): 40 mg via ORAL
  Filled 2014-06-27 (×11): qty 1

## 2014-06-27 MED ORDER — RESOURCE THICKENUP CLEAR PO POWD
ORAL | Status: DC | PRN
  Filled 2014-06-27: qty 125

## 2014-06-27 MED ORDER — NALOXONE HCL 0.4 MG/ML IJ SOLN: 0.4000 mg | INTRAMUSCULAR | Status: DC | PRN

## 2014-06-27 MED ORDER — ALBUTEROL SULFATE (2.5 MG/3ML) 0.083% IN NEBU: 2.5000 mg | INHALATION_SOLUTION | Freq: Four times a day (QID) | RESPIRATORY_TRACT | Status: DC | PRN

## 2014-06-27 NOTE — Progress Notes (Signed)
Discussed with Dr. Wyline Copas this morning to clarify if pt felt to be medically ready to admit to inpt rehab today. We discussed lab results this morning also. I will follow up today with MD. [phone]

## 2014-06-27 NOTE — Progress Notes (Signed)
Speech Language Pathology Treatment: Cognitive-Linquistic  Patient Details Name: Timera Windt MRN: [id number] DOB: March 29, 1940 Today's Date: 06/27/2014 Time: 4709-6283 SLP Time Calculation (min) (ACUTE ONLY): 15 min  Assessment / Plan / Recommendation Clinical Impression  Skilled treatment session focused on addressing cognitive-linguistic goals.  SLP facilitated session with increased wait time and repetition of greeting, which patient was then able to appropriately respond to.  Patient demonstrated appropriate responses for 4 turns at the word and phrase level, "Hi, good, how are you, happy birthday."  SLP then facilitated session with Max verbal and written cues for accuracy with the date.  Further attempts at automatic sequences such as counting or singing resulted in perseveration on months "September, October." Despite SLP attempts to redirect perseveration attempts were not successful.  SLP attempted PO trials but patient declined.  As a result, session was ended.            HPI HPI: Adreanna Fickel is a 74 y.o. female, with a pmh of heart failure (unspecified), diabetes mellitus, chronic hip and back and leg pain, hypertension, and well-controlled Crohn's disease. She has had 2 recent hospitalizations in the last 4.5 weeks. She was discharged on February 21 after suffering with nausea, vomiting, diarrhea, and acute renal failure. She was then discharged on March 21 after having nausea, vomiting, diarrhea and acute renal failure. The patient is currently lethargic and not speaking on 06/19/14, consequently history was collected from her granddaughter [name] who is a Designer, jewellery at [employer] [facility]. [name] relates that her grandmother was not completely well when she left the hospital on 06/16/14. She has been having low-grade temperatures and odorous urine.  Her grandaughter found her unresponsive on 06/19/14 at her home.       Pertinent Vitals Pain Assessment: No/denies pain  SLP Plan  Continue with current plan of care    Recommendations                Oral Care Recommendations: Oral care BID Follow up Recommendations: Inpatient Rehab;24 hour supervision/assistance Plan: Continue with current plan of care    GO    Carmelia Roller., CCC-SLP [phone]  [place] 06/27/2014, 1:33 PM

## 2014-06-27 NOTE — Progress Notes (Signed)
I met with pt and her grand daughter at bedside. They are in agreement to admission to inpt rehab today. I will make the arrangements. [phone]

## 2014-06-27 NOTE — Discharge Summary (Signed)
Physician Discharge Summary  Makalah Asberry MAU:[id number] DOB: 05-Apr-1940 DOA: 06/19/2014  PCP: Hermelinda Medicus, MD  Admit date: 06/19/2014 Discharge date: 06/27/2014  Time spent: 20 minutes  Recommendations for Outpatient Follow-up:   Would follow up with Neurology as scheduled when discharged home  Patient to be discharged to [facility]  Discussed case with ID (Dr. Tommy Medal), who will continue to follow patient while in CIR  Discharge Diagnoses:  Principal Problem:   Sepsis Active Problems:   Crohn disease   Pelvic mass   Back pain, chronic   DM (diabetes mellitus)   Acute encephalopathy   UTI (lower urinary tract infection)   Tobacco abuse   CVA (cerebral infarction)   Meningitis   Fall   Sepsis secondary to UTI   Altered mental status   Blood poisoning   Discharge Condition: Stable  Diet recommendation: Dysphagia 2 with thin liquids  Filed Weights   06/23/14 0555 06/24/14 0400 06/25/14 0400  Weight: 72.575 kg (160 lb) 72.6 kg (160 lb 0.9 oz) 73.6 kg (162 lb 4.1 oz)    History of present illness:  Please see admit h and p from 3/24 for details. Briefly, pt presents with unresponsiveness, being found to have evidence of UTI and sepsis. The patient was admitted for further work up.  Hospital Course:   Severe sepsis- patient came with severe sepsis with temperature 102.5 leukocytosis, lactic acidosis and tachypnea with respirations in the 30s. Likely the source is UTI, CSF analysis is not suggestive of infectious etiology. Patient was initially started on vancomycin, ampicillin and Rocephin for possible bacterial meningitis. Neurology had seen patient given concerns of above. WBC down to 11.2. ID was later consulted for possible meningitis, and ID does not feel that she has bacterial meningitis , considering the few wbc in csf and normal protein. ID discontinued the antibiotics 3/27, but developed fever afterwards. ID has since resumed rocephin and vanc as of 3/29. ID recs to  continue abx and await cx results.  Noted clinical improvement as noted by family. Discussed case with ID with recs to continue current abx regimen and will continue to follow patient while at CIR   Acute encephalopathy- slowly resolving, likely from CVA MRI showed acute stroke in posterior medial thalamus. Neurology had been following. Initially on keppra, but later stopped per Neurology recs.  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 2-D echo showed EF 60-65%, no thrombus noted in the echo. Carotid duplex showed 1-39% left ICA stenosis, unable to visualize right ICA due to patient's inability to keep head turned. CIR recommended by PT/OT  CIR recs to admit to rehab once medically stable.   Hypokalemia- was receiving IV fluids normal saline with 20 of KCl at 75 per hour. Magnesium replaced   Hypomagnesemia- replaced   ? UTI with sepsis- patient came with abnormal UA. Urine cultures are growing Escherichia coli, ,80,000 colonies. ID initially discontinued rocephin, but has since resumed abx per above given recurrent fever, per above   Hypertension-Resumed home antihypertensive medications.   Diabetes mellitus- patient is currently on sliding-scale insulin. Blood glucose is well controlled.   Acute kidney injury- resolved after IV fluids. Creatinine remains in normal range.  Consultations:  ID  Neurology  Discharge Exam: Filed Vitals:   06/26/14 2356 06/27/14 0321 06/27/14 0816 06/27/14 1140  BP: 123/52 137/50 153/65 118/53  Pulse: 58 56 86 55  Temp: 97.4 F (36.3 C) 98.3 F (36.8 C) 98.6 F (37 C)  64 F (36.7 C)  TempSrc: Oral Oral Oral Oral  Resp:   20 18  Height:      Weight:      SpO2: 100% 100% 100% 100%    General: Awake, in nad Cardiovascular: regular, s1, s2 Respiratory: normal resp effort, no wheezing  Discharge Instructions      Discharge Instructions    Ambulatory  referral to Neurology    Complete by:  As directed   Dr. Leonie Man requests follow up for this patient in 2 months.            Medication List    STOP taking these medications        HYDROcodone-acetaminophen 10-325 MG per tablet  Commonly known as:  NORCO     traZODone 100 MG tablet  Commonly known as:  DESYREL      TAKE these medications        aspirin 325 MG EC tablet  Take 1 tablet (325 mg total) by mouth daily.     bisacodyl 10 MG suppository  Commonly known as:  DULCOLAX  Place 1 suppository (10 mg total) rectally daily as needed for moderate constipation.     cefTRIAXone 40 MG/ML IVPB  Commonly known as:  ROCEPHIN  Inject 50 mLs (2 g total) into the vein every 12 (twelve) hours.     cloNIDine 0.1 MG tablet  Commonly known as:  CATAPRES  Take 1 tablet (0.1 mg total) by mouth 2 (two) times daily.     famotidine 20 MG tablet  Commonly known as:  PEPCID  Take 1 tablet (20 mg total) by mouth at bedtime.     folic acid 1 MG tablet  Commonly known as:  FOLVITE  Take 1 mg by mouth daily.     gabapentin 600 MG tablet  Commonly known as:  NEURONTIN  Take 600 mg by mouth 3 (three) times daily.     hydrALAZINE 50 MG tablet  Commonly known as:  APRESOLINE  Take 1 tablet (50 mg total) by mouth every 8 (eight) hours.     lisinopril 10 MG tablet  Commonly known as:  PRINIVIL,ZESTRIL  Take 1 tablet (10 mg total) by mouth daily.     omeprazole 20 MG capsule  Commonly known as:  PRILOSEC  Take 20 mg by mouth every morning.     ondansetron 4 MG tablet  Commonly known as:  ZOFRAN  Take 1 tablet (4 mg total) by mouth every 6 (six) hours as needed for nausea.     potassium chloride SA 20 MEQ tablet  Commonly known as:  K-DUR,KLOR-CON  Take 20 mEq by mouth daily.     PROAIR HFA 108 (90 BASE) MCG/ACT inhaler  Generic drug:  albuterol  Inhale 1 puff into the lungs every 6 (six) hours as needed. Shortness of breath or wheezing     simvastatin 40 MG tablet  Commonly  known as:  ZOCOR  Take 40 mg by mouth at bedtime.     sitaGLIPtin 100 MG tablet  Commonly known as:  JANUVIA  Take 50 mg by mouth every morning.     spironolactone 25 MG tablet  Commonly known as:  ALDACTONE  Take 25 mg by mouth every morning.     sulfaSALAzine 500 MG tablet  Commonly known as:  AZULFIDINE  Take 500 mg by mouth 4 (four) times daily.     Vancomycin 750 MG/150ML Soln  Commonly known as:  VANCOCIN  Inject 150 mLs (750 mg total) into  the vein every 12 (twelve) hours.     Vitamin D 2000 UNITS Caps  Take 1 capsule by mouth daily.       No Known Allergies Follow-up Information    Follow up with SETHI,PRAMOD, MD. Schedule an appointment as soon as possible for a visit in 2 months.   Specialties:  Neurology, Radiology   Contact information:   [address] [place] [address] [phone]       Follow up with Hermelinda Medicus, MD. Schedule an appointment as soon as possible for a visit in 1 week.   Specialty:  Internal Medicine   Contact information:   [address] [place] [address] [phone]        The results of significant diagnostics from this hospitalization (including imaging, microbiology, ancillary and laboratory) are listed below for reference.    Significant Diagnostic Studies: Ct Head Wo Contrast  06/19/2014   CLINICAL DATA:  Altered mental status, nonverbal with fixed right gaze.  EXAM: CT HEAD WITHOUT CONTRAST  TECHNIQUE: Contiguous axial images were obtained from the base of the skull through the vertex without intravenous contrast.  COMPARISON:  None.  FINDINGS: Skull and Sinuses:No fracture or aggressive lesion. Fluid level in the right maxillary sinus and mild scattered inflammatory mucosal thickening is likely incidental given the history.  Orbits: Rightward deviation of gaze.  Brain: No evidence of acute infarction, hemorrhage, hydrocephalus, or mass lesion/mass effect. The insular ribbon on the left  is somewhat indistinct inferiorly, but symmetric. Mild periventricular and subcortical left occipital low-density is most likely chronic small vessel ischemia.  IMPRESSION: 1. No acute intracranial findings. 2. Mild chronic small vessel ischemia.   Electronically Signed   By: Monte Fantasia M.D.   On: 06/19/2014 14:50   Ct Cervical Spine Wo Contrast  06/19/2014   CLINICAL DATA:  Found on floor, with right-sided gaze. Concern for cervical spine injury. Initial encounter.  EXAM: CT CERVICAL SPINE WITHOUT CONTRAST  TECHNIQUE: Multidetector CT imaging of the cervical spine was performed without intravenous contrast. Multiplanar CT image reconstructions were also generated.  COMPARISON:  None.  FINDINGS: There is no evidence of fracture or subluxation. Vertebral bodies demonstrate normal height and alignment. Multilevel disc space narrowing is noted along the lower cervical spine, with anterior and posterior disc osteophyte complexes. Prevertebral soft tissues are within normal limits.  The thyroid gland is unremarkable in appearance. The visualized lung apices are clear. Calcification is noted at the carotid bifurcations, more prominent on the right. Mucosal thickening is noted at the left side of the sphenoid sinus.  IMPRESSION: 1. No evidence of fracture or subluxation along the cervical spine. 2. Mild degenerative change along the lower cervical spine. 3. Calcification at the carotid bifurcations, more prominent on the right. Carotid ultrasound would be helpful for further evaluation, when and as deemed clinically appropriate. 4. Mucosal thickening at the left side of the sphenoid sinus.   Electronically Signed   By: Garald Balding M.D.   On: 06/19/2014 17:29   Mr Virgel Paling Wo Contrast  06/21/2014   CLINICAL DATA:  74 year old female with altered mental status, found down. MRI reveals dorsal left thalamus signal abnormality suspicious for infarct. Initial encounter.  EXAM: MRA HEAD WITHOUT CONTRAST  TECHNIQUE:  Angiographic images of the Circle of Willis were obtained using MRA technique without intravenous contrast.  COMPARISON:  Brain MRI 06/20/2014 and earlier.  FINDINGS: Study is mildly degraded by motion artifact despite repeated imaging attempts.  Antegrade flow in  both distal vertebral arteries in the posterior circulation. Both PICA origins appear patent. Patent vertebrobasilar junction. No evidence of basilar artery stenosis.  Both PCA origins are patent. There is mild to moderate irregularity of the left PCA P1 segment in the region of the thalamostriate artery origin. Otherwise bilateral PCA branches are within normal limits.  Posterior communicating arteries are diminutive or absent.  Antegrade flow in both ICA siphons. No evidence of siphon stenosis. Ophthalmic artery origins are within normal limits. Carotid termini are patent. MCA and ACA origins are patent.  Visualized bilateral MCA and ACA branches are degraded by motion, with no major branch occlusion identified.  IMPRESSION: 1. Mildly motion degraded despite repeated imaging attempts. 2. Left PCA P1 segment irregularity in the region of the left thalamostriate artery origin likely corresponds to the acute left thalamus finding. 3. No other abnormality is evident on this intracranial MRA.   Electronically Signed   By: Genevie Ann M.D.   On: 06/21/2014 10:01   Mr Brain Wo Contrast  06/20/2014   CLINICAL DATA:  Altered mental status. Found on the floor unresponsive yesterday.  EXAM: MRI HEAD WITHOUT CONTRAST  TECHNIQUE: Multiplanar, multiecho pulse sequences of the brain and surrounding structures were obtained without intravenous contrast.  COMPARISON:  CT HEAD WITHOUT CONTRAST 06/19/2014  FINDINGS: The diffusion-weighted images demonstrate an acute/subacute nonhemorrhagic infarct involving the posterior medial left thalamus. The infarct measures 10 mm maximally. Subtle T2 changes are associated with the infarct.  A remote left occipital lobe scratch side a  remote medial left occipital lobe infarct is noted. Minimal scratch the mild periventricular white matter changes are present bilaterally. Minimal subcortical white matter change otherwise is predominantly in the anterior frontal lobes.  The ventricles are of normal size. Flow is present in the major intracranial arteries. 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 No other significant extra-axial fluid collections are present.  The globes and orbits are intact. Chronic right maxillary sinus disease is present. There is a polyp or mucous retention cyst within the left sphenoid sinus. The paranasal sinuses and mastoid air cells are otherwise clear. The skullbase is unremarkable. The intracranial midline structures are within normal limits. 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  IMPRESSION: 1. 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 2. Remote medial left occipital lobe infarct. 3. Atrophy and minimal white matter changes otherwise within normal limits for age. 4. Chronic right maxillary sinus disease.   Electronically Signed   By: San Morelle M.D.   On: 06/20/2014 07:35   Dg Chest Port 1 View  06/19/2014   CLINICAL DATA:  74 year old female with altered mental status, found down this morning. Recent hospitalization for gastroenteritis. Initial encounter.  EXAM: PORTABLE CHEST - 1 VIEW  COMPARISON:  CT Abdomen and Pelvis 05/16/2014, and earlier.  FINDINGS: Portable AP semi upright view at 1241 hours. Lower lung volumes. Normal cardiac size and mediastinal contours. Allowing for portable technique, the lungs are clear. No pneumothorax or pleural effusion identified.  IMPRESSION: No acute cardiopulmonary abnormality.   Electronically Signed   By: Genevie Ann M.D.   On: 06/19/2014 13:25   Dg Fluoro Guide Lumbar Puncture  06/20/2014   CLINICAL DATA:  Meningitis  EXAM: DIAGNOSTIC LUMBAR PUNCTURE UNDER FLUOROSCOPIC GUIDANCE   FLUOROSCOPY TIME:  Radiation Exposure Index (as provided by the fluoroscopic device): 15.3 mGy entrance does  If the device does not provide the exposure index:  Fluoroscopy Time (in  minutes and seconds):  0.8 min  Number of Acquired Images:  3  PROCEDURE: Informed consent was obtained from the patient's granddaughter and power of attorney prior to the procedure. With the patient prone, the lower back was prepped with Betadine. 1% Lidocaine was used for local anesthesia. Lumbar puncture was performed at the L3-4 level using a 20 gauge needle with return of clear CSF. 8 ml of CSF were obtained for laboratory studies. The patient tolerated the procedure well and there were no apparent complications.  IMPRESSION: Successful lumbar puncture with fluoroscopy.   Electronically Signed   By: Monte Fantasia M.D.   On: 06/20/2014 11:46    Microbiology: Recent Results (from the past 240 hour(s))  Blood Culture (routine x 2)     Status: None   Collection Time: 06/19/14 12:23 PM  Result Value Ref Range Status   Specimen Description BLOOD LEFT ANTECUBITAL  Final   Special Requests BOTTLES DRAWN AEROBIC AND ANAEROBIC 3CC  Final   Culture   Final    NO GROWTH 5 DAYS Performed at [facility]    Report Status 06/25/2014 FINAL  Final  Blood Culture (routine x 2)     Status: None   Collection Time: 06/19/14 12:36 PM  Result Value Ref Range Status   Specimen Description BLOOD LEFT ANTECUBITAL  Final   Special Requests BOTTLES DRAWN AEROBIC AND ANAEROBIC 5ML  Final   Culture   Final    NO GROWTH 5 DAYS Performed at [facility]    Report Status 06/25/2014 FINAL  Final  Urine culture     Status: None   Collection Time: 06/19/14  2:32 PM  Result Value Ref Range Status   Specimen Description URINE, CLEAN CATCH  Final   Special Requests NONE  Final   Colony Count   Final    80,000 COLONIES/ML Performed at [facility]    Culture   Final    ESCHERICHIA COLI Performed at [facility]    Report Status 06/21/2014 FINAL  Final   Organism ID, Bacteria ESCHERICHIA COLI  Final      Susceptibility   Escherichia coli - MIC*    AMPICILLIN >=32 RESISTANT Resistant     CEFAZOLIN >=64 RESISTANT Resistant     CEFTRIAXONE <=1 SENSITIVE Sensitive     CIPROFLOXACIN >=4 RESISTANT Resistant     GENTAMICIN <=1 SENSITIVE Sensitive     LEVOFLOXACIN >=8 RESISTANT Resistant     NITROFURANTOIN <=16 SENSITIVE Sensitive     TOBRAMYCIN <=1 SENSITIVE Sensitive     TRIMETH/SULFA >=320 RESISTANT Resistant     PIP/TAZO >=128 RESISTANT Resistant     * ESCHERICHIA COLI  MRSA PCR Screening     Status: None   Collection Time: 06/19/14  8:30 PM  Result Value Ref Range Status   MRSA by PCR NEGATIVE NEGATIVE Final    Comment:        The GeneXpert MRSA Assay (FDA approved for NASAL specimens only), is one component of a comprehensive MRSA colonization surveillance program. It is not intended to diagnose MRSA infection nor to guide or monitor treatment for MRSA infections.   CSF culture     Status: None   Collection Time: 06/20/14 11:33 AM  Result Value Ref Range Status   Specimen Description CSF  Final   Special Requests NONE  Final   Gram Stain   Final    FEW WBC PRESENT, PREDOMINANTLY PMN NO ORGANISMS SEEN Gram Stain Report Called to,Read Back By  and Verified With: Gram Stain Report Called to,Read Back By and Verified With: ALLISON DUNDON [id number]@1 :00AM BJENN Performed at [facility]    Culture   Final    NO GROWTH 3 DAYS Performed at [facility]    Report Status 06/24/2014 FINAL  Final  Gram stain     Status: None   Collection Time: 06/20/14 11:33 AM  Result Value Ref Range Status   Specimen Description CSF  Final   Special Requests NONE  Final   Gram Stain   Final    CYTOSPIN PREP WBC PRESENT, PREDOMINANTLY PMN NO ORGANISMS SEEN    Report Status 06/20/2014 FINAL  Final  Culture, blood (routine x 2)     Status: None (Preliminary result)    Collection Time: 06/24/14  6:44 PM  Result Value Ref Range Status   Specimen Description BLOOD LEFT HAND  Final   Special Requests BOTTLES DRAWN AEROBIC ONLY 3CC  Final   Culture   Final           BLOOD CULTURE RECEIVED NO GROWTH TO DATE CULTURE WILL BE HELD FOR 5 DAYS BEFORE ISSUING A FINAL NEGATIVE REPORT Performed at [facility]    Report Status PENDING  Incomplete  Culture, blood (routine x 2)     Status: None (Preliminary result)   Collection Time: 06/24/14  6:53 PM  Result Value Ref Range Status   Specimen Description BLOOD LEFT HAND  Final   Special Requests BOTTLES DRAWN AEROBIC AND ANAEROBIC 5CC  Final   Culture   Final           BLOOD CULTURE RECEIVED NO GROWTH TO DATE CULTURE WILL BE HELD FOR 5 DAYS BEFORE ISSUING A FINAL NEGATIVE REPORT Performed at [facility]    Report Status PENDING  Incomplete  Culture, Urine     Status: None   Collection Time: 06/24/14  7:50 PM  Result Value Ref Range Status   Specimen Description URINE, RANDOM  Final   Special Requests Normal  Final   Colony Count NO GROWTH Performed at [facility]   Final   Culture NO GROWTH Performed at [facility]   Final   Report Status 06/26/2014 FINAL  Final  Clostridium Difficile by PCR     Status: None   Collection Time: 06/26/14  8:19 PM  Result Value Ref Range Status   C difficile by pcr NEGATIVE NEGATIVE Final     Labs: Basic Metabolic Panel:  Recent Labs Lab 06/21/14 0550  06/23/14 0545 06/25/14 0546 06/25/14 1400 06/26/14 0454 06/27/14 0605 06/27/14 1015  NA 139  < > 144 139  --  138 125* 139  K 3.4*  < > 4.6 3.1*  --  3.1* 2.9* 3.8  CL 107  < > 118* 111  --  112 100 111  CO2 26  < > 21 24  --  19 19 20   GLUCOSE 114*  < > 116* 159*  --  144* 547* 173*  BUN <5*  < > <5* <5*  --  <5* <5* <5*  CREATININE 0.85  < > 0.93 0.91  --  0.87 0.80 0.92  CALCIUM 9.3  < > 8.5 8.5  --  8.7 8.1* 9.4  MG 1.5  --   --   --  1.2* 1.8 1.8  --   < > = values in  this interval not displayed. Liver Function Tests:  Recent Labs Lab 06/26/14 0454  AST 27  ALT 35  ALKPHOS 41  BILITOT 0.5  PROT 4.9*  ALBUMIN 2.5*   No results for input(s): LIPASE, AMYLASE in the last 168 hours. No results for input(s): AMMONIA in the last 168 hours. CBC:  Recent Labs Lab 06/21/14 0550 06/23/14 0545 06/24/14 1500 06/26/14 0454 06/27/14 0605  WBC 11.2* 8.2 7.5 6.2 5.6  HGB 9.8* 8.2* 8.6* 8.3* 8.1*  HCT 28.5* 23.5* 24.3* 23.6* 22.6*  MCV 86.9 87.4 85.6 86.1 85.9  PLT 167 151 175 155 160   Cardiac Enzymes: No results for input(s): CKTOTAL, CKMB, CKMBINDEX, TROPONINI in the last 168 hours. BNP: BNP (last 3 results) No results for input(s): BNP in the last 8760 hours.  ProBNP (last 3 results)  Recent Labs  08/27/13 2205 08/28/13 0454  PROBNP 263.1* 251.0*    CBG:  Recent Labs Lab 06/26/14 2100 06/26/14 2359 06/27/14 0324 06/27/14 0724 06/27/14 1158  GLUCAP 102* 137* 125* 125* 133*    Signed:  CHIU, STEPHEN K  [facility] 06/27/2014, 1:11 PM

## 2014-06-27 NOTE — Progress Notes (Signed)
ANTIBIOTIC CONSULT NOTE   Pharmacy Consult for Vancomycin / Rocephin Indication: UTI and probable meningitis  No Known Allergies  Labs:  Recent Labs  06/24/14 1500 06/25/14 0546 06/26/14 0454 06/27/14 0605  WBC 7.5  --  6.2 5.6  HGB 8.6*  --  8.3* 8.1*  PLT 175  --  155 160  CREATININE  --  0.91 0.87 0.80   Estimated Creatinine Clearance: 65.2 mL/min (by C-G formula based on Cr of 0.8). No results for input(s): VANCOTROUGH, VANCOPEAK, VANCORANDOM, GENTTROUGH, GENTPEAK, GENTRANDOM, TOBRATROUGH, TOBRAPEAK, TOBRARND, AMIKACINPEAK, AMIKACINTROU, AMIKACIN in the last 72 hours.   Microbiology: Recent Results (from the past 720 hour(s))  Blood Culture (routine x 2)     Status: None   Collection Time: 06/19/14 12:23 PM  Result Value Ref Range Status   Specimen Description BLOOD LEFT ANTECUBITAL  Final   Special Requests BOTTLES DRAWN AEROBIC AND ANAEROBIC 3CC  Final   Culture   Final    NO GROWTH 5 DAYS Performed at [facility]    Report Status 06/25/2014 FINAL  Final  Blood Culture (routine x 2)     Status: None   Collection Time: 06/19/14 12:36 PM  Result Value Ref Range Status   Specimen Description BLOOD LEFT ANTECUBITAL  Final   Special Requests BOTTLES DRAWN AEROBIC AND ANAEROBIC 5ML  Final   Culture   Final    NO GROWTH 5 DAYS Performed at [facility]    Report Status 06/25/2014 FINAL  Final  Urine culture     Status: None   Collection Time: 06/19/14  2:32 PM  Result Value Ref Range Status   Specimen Description URINE, CLEAN CATCH  Final   Special Requests NONE  Final   Colony Count   Final    80,000 COLONIES/ML Performed at [facility]    Culture   Final    ESCHERICHIA COLI Performed at [facility]    Report Status 06/21/2014 FINAL  Final   Organism ID, Bacteria ESCHERICHIA COLI  Final      Susceptibility   Escherichia coli - MIC*    AMPICILLIN >=32 RESISTANT Resistant     CEFAZOLIN >=64 RESISTANT Resistant    CEFTRIAXONE <=1 SENSITIVE Sensitive     CIPROFLOXACIN >=4 RESISTANT Resistant     GENTAMICIN <=1 SENSITIVE Sensitive     LEVOFLOXACIN >=8 RESISTANT Resistant     NITROFURANTOIN <=16 SENSITIVE Sensitive     TOBRAMYCIN <=1 SENSITIVE Sensitive     TRIMETH/SULFA >=320 RESISTANT Resistant     PIP/TAZO >=128 RESISTANT Resistant     * ESCHERICHIA COLI  MRSA PCR Screening     Status: None   Collection Time: 06/19/14  8:30 PM  Result Value Ref Range Status   MRSA by PCR NEGATIVE NEGATIVE Final    Comment:        The GeneXpert MRSA Assay (FDA approved for NASAL specimens only), is one component of a comprehensive MRSA colonization surveillance program. It is not intended to diagnose MRSA infection nor to guide or monitor treatment for MRSA infections.   CSF culture     Status: None   Collection Time: 06/20/14 11:33 AM  Result Value Ref Range Status   Specimen Description CSF  Final   Special Requests NONE  Final   Gram Stain   Final    FEW WBC PRESENT, PREDOMINANTLY PMN NO ORGANISMS SEEN Gram Stain Report Called to,Read Back By and Verified With: Gram Stain Report Called to,Read Back By and Verified  With: Johny Shock [id number]@1 :00AM BJENN Performed at [facility]    Culture   Final    NO GROWTH 3 DAYS Performed at [facility]    Report Status 06/24/2014 FINAL  Final  Gram stain     Status: None   Collection Time: 06/20/14 11:33 AM  Result Value Ref Range Status   Specimen Description CSF  Final   Special Requests NONE  Final   Gram Stain   Final    CYTOSPIN PREP WBC PRESENT, PREDOMINANTLY PMN NO ORGANISMS SEEN    Report Status 06/20/2014 FINAL  Final  Culture, blood (routine x 2)     Status: None (Preliminary result)   Collection Time: 06/24/14  6:44 PM  Result Value Ref Range Status   Specimen Description BLOOD LEFT HAND  Final   Special Requests BOTTLES DRAWN AEROBIC ONLY 3CC  Final   Culture   Final           BLOOD CULTURE RECEIVED NO GROWTH TO DATE  CULTURE WILL BE HELD FOR 5 DAYS BEFORE ISSUING A FINAL NEGATIVE REPORT Performed at [facility]    Report Status PENDING  Incomplete  Culture, blood (routine x 2)     Status: None (Preliminary result)   Collection Time: 06/24/14  6:53 PM  Result Value Ref Range Status   Specimen Description BLOOD LEFT HAND  Final   Special Requests BOTTLES DRAWN AEROBIC AND ANAEROBIC 5CC  Final   Culture   Final           BLOOD CULTURE RECEIVED NO GROWTH TO DATE CULTURE WILL BE HELD FOR 5 DAYS BEFORE ISSUING A FINAL NEGATIVE REPORT Performed at [facility]    Report Status PENDING  Incomplete  Culture, Urine     Status: None   Collection Time: 06/24/14  7:50 PM  Result Value Ref Range Status   Specimen Description URINE, RANDOM  Final   Special Requests Normal  Final   Colony Count NO GROWTH Performed at [facility]   Final   Culture NO GROWTH Performed at [facility]   Final   Report Status 06/26/2014 FINAL  Final  Clostridium Difficile by PCR     Status: None   Collection Time: 06/26/14  8:19 PM  Result Value Ref Range Status   C difficile by pcr NEGATIVE NEGATIVE Final    Assessment: 74 y.o. female known to pharmacy from previous antibiotic dosing. Pt had been off of antibiotics x 48hours. ID service following. Pt has developed recurrent low-grade fever and diminished mental status again 3/29. Previous cultures showed Ecoli (sensitive to Rocephin, [place], [place], [place]). Plan to pan culture.   On day 4 of vancomycin / Rocephin restart WBC stable, Scr stable, afebrile, repeat cultures negative  Goal of Therapy:  Vancomycin trough level 15-20 mcg/ml  Plan:  1. Vancomycin 746m IV q12h and Rocephin 2 grams iv Q 12 hours 2. Will f/u micro data, pt's clinical condition, and renal function 3. Vancomycin trough this evening  Thank you. LAnette Guarneri PharmD [phone] 06/27/2014,10:27 AM

## 2014-06-27 NOTE — H&P (Signed)
Physical Medicine and Rehabilitation Admission H&P   Chief Complaint  Patient presents with  . Weakness and confusion    HPI: Michelle Farrell is a 74 y.o. female with DM, chronic pain, hypertension, Crohn's disease, recent discharged on 06/16/14 past admission for gastroenteritis with dehydration. She was readmitted on 06/19/14 past found unresponsive on the floor by her granddaughter. She was febrile with elevated lactate and UA with 3-6 WBCs and + nitrites. Family occasional twitching and patient with neck stiffness and Dr.Kirpatick recommended Keppra as well as LP due to concerns of meningitis or encephalitis. She was started on Vanc, ceftriaxone, ampicillin and acyclovir. Urine culture grew 80,000 colonies E coli but AMS persisted.  LP done then by IR on day 2 of hospitalization and noted 28 WBCs with 79% neutrophils, mildly elevated glucose and mildly elevated protein to 85 with some RBCs in sample. CSF and blood cultures have remained negative. HSV negative. 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 Carotid dopplers without significant ICA stenosis. 2D echo with EF 60-65% and grade 1 diastolic dysfunction. EEG with nonspecific slowing with periodic discharges from left hemisphere most often seen following strokes.   ID consulted for input and recommended d/c antibiotics and monitoring for recurrent fevers. Patient has had issues with lethargy and keppra d/c 3/27 per neurology input. Patient had recurrent fevers with lethargy 48 hours past stopping empiric antibiotics and she was pan cultured with resumption of Vancomycin and Ceftriaxone. Urine culture without growth and stools negative for cdiff. Blood cultures X 2 pending. Dr Tommy Medal recommended d/c antibiotics and repeat LP but family reluctant to deescalate care. He recommends two weeks of empiric course of high dose ceftriaxone plus/minus Vancomycin. Therapy  ongoing and patient has difficulty following 1 step commands as well as confusion and inability to answer basic Y/N questions. Has safety awareness, perseverative behaviors as well as impaired mobility. CIR recommended by MD and Rehab team and patient cleared for admission today.    Review of Systems  Unable to perform ROS: mental acuity      Past Medical History  Diagnosis Date  . CHF (congestive heart failure)   . Hypertension   . Back pain, chronic   . Hip pain, chronic   . GERD (gastroesophageal reflux disease)   . Crohn disease   . Diabetes mellitus without complication   . Esophageal stricture     s/p dilation 2003, 2008  . PUD (peptic ulcer disease)   . CKD (chronic kidney disease)   . COPD (chronic obstructive pulmonary disease)     Past Surgical History  Procedure Laterality Date  . Abdominal hysterectomy    . Tonsillectomy    . Cholecystectomy    . Bladder surgery      Family History  Problem Relation Age of Onset  . Diabetes Mellitus II Other     Social History: Lives alone? Independent PTA. reports that she has been smoking. She has never used smokeless tobacco. She reports that she does not drink alcohol or use illicit drugs.    Allergies: No Known Allergies    Medications Prior to Admission  Medication Sig Dispense Refill  . Cholecalciferol (VITAMIN D) 2000 UNITS CAPS Take 1 capsule by mouth daily.    . cloNIDine (CATAPRES) 0.1 MG tablet Take 1 tablet (0.1 mg total) by mouth 2 (two) times daily. 60 tablet 11  . folic acid (FOLVITE) 1 MG tablet Take 1 mg by mouth daily.    Marland Kitchen  gabapentin (NEURONTIN) 600 MG tablet Take 600 mg by mouth 3 (three) times daily.     . hydrALAZINE (APRESOLINE) 50 MG tablet Take 1 tablet (50 mg total) by mouth every 8 (eight) hours. 90 tablet 0  . HYDROcodone-acetaminophen (NORCO) 10-325 MG per tablet Take 1 tablet by mouth  every 6 (six) hours as needed (for pain.).     Marland Kitchen lisinopril (PRINIVIL,ZESTRIL) 10 MG tablet Take 1 tablet (10 mg total) by mouth daily.    Marland Kitchen omeprazole (PRILOSEC) 20 MG capsule Take 20 mg by mouth every morning.     . ondansetron (ZOFRAN) 4 MG tablet Take 1 tablet (4 mg total) by mouth every 6 (six) hours as needed for nausea. 20 tablet 0  . potassium chloride SA (K-DUR,KLOR-CON) 20 MEQ tablet Take 20 mEq by mouth daily.    Marland Kitchen PROAIR HFA 108 (90 BASE) MCG/ACT inhaler Inhale 1 puff into the lungs every 6 (six) hours as needed. Shortness of breath or wheezing  3  . simvastatin (ZOCOR) 40 MG tablet Take 40 mg by mouth at bedtime.     . sitaGLIPtin (JANUVIA) 100 MG tablet Take 50 mg by mouth every morning.     Marland Kitchen spironolactone (ALDACTONE) 25 MG tablet Take 25 mg by mouth every morning.     . sulfaSALAzine (AZULFIDINE) 500 MG tablet Take 500 mg by mouth 4 (four) times daily.    . traZODone (DESYREL) 100 MG tablet Take 200 mg by mouth at bedtime.   0    Home: Home Living Family/patient expects to be discharged to:: Private residence Living Arrangements: Alone, Other relatives Available Help at Discharge: Family, Other (Comment) (granddaughter works from home, can arrange 24 hour care) Type of Home: Apartment Home Access: Stairs to enter Technical brewer of Steps: [place]: One level [place]: [place] - single point, Environmental consultant - 2 wheels Lives With: Alone  Functional History: Prior Function Level of Independence: Independent Comments: has RW and cane but doesn't use per daughter  Functional Status:  Mobility: Bed Mobility Overal bed mobility: Needs Assistance Bed Mobility: Supine to Sit, Sit to Supine Rolling: Min assist, Min guard Sidelying to sit: Max assist Supine to sit: Min guard Sit to supine: Min guard General bed mobility comments: No physical assist needed but min guard for safety. Increased time required.    Transfers Overall transfer level: Needs assistance Equipment used: 1 person hand held assist Transfers: Sit to/from Stand, Stand Pivot Transfers Sit to Stand: Min assist Stand pivot transfers: Min assist General transfer comment: VC's for sequencing. Pt with decreased activity tolerance.  Ambulation/Gait Ambulation/Gait assistance: Min assist Ambulation Distance (Feet): 90 Feet Assistive device: 1 person hand held assist (IV pole) Gait Pattern/deviations: Step-through pattern, Shuffle, Wide base of support, Drifts right/left Gait velocity: slow Gait velocity interpretation: Below normal speed for age/gender General Gait Details: more fluid pattern today but limited endurance    ADL: ADL Overall ADL's : Needs assistance/impaired Grooming: Wash/dry face, Moderate assistance, Sitting Grooming Details (indicate cue type and reason): hand over hand assist. VC's for sequencing and commands.  Upper Body Bathing: Maximal assistance, Sitting Lower Body Bathing: Maximal assistance, Sit to/from stand Upper Body Dressing : Maximal assistance, Sitting Lower Body Dressing: Maximal assistance, Sit to/from stand, Total assistance Toilet Transfer: Minimal assistance, Stand-pivot, BSC Toilet Transfer Details (indicate cue type and reason): min A to balance and direct actions.  Functional mobility during ADLs: Min guard, Minimal assistance General ADL Comments: Pt continues to have difficulty following commands. Pt responded "Yeah"  when asked if she needed to use the bathroom. Pt get to EOB and required min A to stand. Once standing, pt appeared not to know what she was doing. Directed pt to sit on [facility] and pt turned and instead sat on bed stating "I don't have to use the bathroom." Again when asked "Do you need to use the bathroom?" Pt stated "Yeah." Pt sat EOB and worked on following commands. Pt is perseverative and repeating actions ("touch your nose") and having difficulty moving on from actions.    Cognition: Cognition Overall Cognitive Status: Impaired/Different from baseline Arousal/Alertness: Lethargic Orientation Level: Oriented to person, Other (comment) Attention: Sustained Sustained Attention: Impaired Sustained Attention Impairment: Verbal basic, Functional basic Memory: Impaired Awareness: Impaired Problem Solving: Impaired Problem Solving Impairment: Verbal basic, Functional basic Behaviors: Restless, Physical agitation, Verbal agitation, Impulsive Safety/Judgment: Impaired Cognition Arousal/Alertness: Lethargic Behavior During Therapy: Flat affect Overall Cognitive Status: Impaired/Different from baseline Area of Impairment: Safety/judgement, Following commands, Problem solving Current Attention Level: Selective Memory: Decreased recall of precautions, Decreased short-term memory Following Commands: Follows one step commands inconsistently Safety/Judgement: Decreased awareness of safety, Decreased awareness of deficits Awareness: Intellectual Problem Solving: Slow processing, Decreased initiation General Comments: Pt is trying to sit initially but apparenlty was convfused about BSC thinking she should sit every time she saw it. Difficult to assess due to: Level of arousal  Physical Exam: Blood pressure 118/53, pulse 55, temperature 98 F (36.7 C), temperature source Oral, resp. rate 18, height 5' 6"  (1.676 m), weight 73.6 kg (162 lb 4.1 oz), SpO2 100 %. Physical Exam  Nursing note and vitals reviewed. Constitutional: She appears well-developed and well-nourished.  HENT: dentition fair Head: Normocephalic and atraumatic.  Eyes: Conjunctivae are normal. Pupils are equal, round, and reactive to light.  Neck: Normal range of motion. Neck supple.  Cardiovascular: Normal rate and regular rhythm.no murmur or gallop  Respiratory: Effort normal and breath sounds normal. No wheezes GI: Soft. Bowel sounds are normal. She exhibits no distension. There is no  tenderness.  Musculoskeletal: She exhibits tenderness. She exhibits no edema.  Neurological: She is alert.  Oriented to self only. Looked straight ahead and needed constant cues to make eye contact. Tardrive dyskinesia--kept smacking her lips. Was able to state her name and age but perseverated on "80" thorough out the rest of exam. Unable to follow simple motor commands without visual and tactile cues. Moves Left side > right. Difficult to engage Skin: Skin is warm and dry.  Psych: quiet, withdrawn    Lab Results Last 48 Hours    Results for orders placed or performed during the hospital encounter of 06/19/14 (from the past 48 hour(s))  Magnesium Status: Abnormal   Collection Time: 06/25/14 2:00 PM  Result Value Ref Range   Magnesium 1.2 (L) 1.5 - 2.5 mg/dL  Glucose, capillary Status: Abnormal   Collection Time: 06/25/14 4:44 PM  Result Value Ref Range   Glucose-Capillary 156 (H) 70 - 99 mg/dL  Glucose, capillary Status: Abnormal   Collection Time: 06/25/14 7:57 PM  Result Value Ref Range   Glucose-Capillary 196 (H) 70 - 99 mg/dL  Glucose, capillary Status: Abnormal   Collection Time: 06/26/14 2:15 AM  Result Value Ref Range   Glucose-Capillary 113 (H) 70 - 99 mg/dL  Glucose, capillary Status: Abnormal   Collection Time: 06/26/14 4:18 AM  Result Value Ref Range   Glucose-Capillary 120 (H) 70 - 99 mg/dL  Comprehensive metabolic panel Status: Abnormal   Collection Time: 06/26/14 4:54 AM  Result Value  Ref Range   Sodium 138 135 - 145 mmol/L   Potassium 3.1 (L) 3.5 - 5.1 mmol/L   Chloride 112 96 - 112 mmol/L   CO2 19 19 - 32 mmol/L   Glucose, Bld 144 (H) 70 - 99 mg/dL   BUN <5 (L) 6 - 23 mg/dL   Creatinine, Ser 0.87 0.50 - 1.10 mg/dL   Calcium 8.7 8.4 - 10.5 mg/dL   Total Protein 4.9 (L) 6.0 - 8.3 g/dL   Albumin 2.5 (L) 3.5 - 5.2 g/dL   AST 27 0 -  37 U/L   ALT 35 0 - 35 U/L   Alkaline Phosphatase 41 39 - 117 U/L   Total Bilirubin 0.5 0.3 - 1.2 mg/dL   GFR calc non Af Amer 65 (L) >90 mL/min   GFR calc Af Amer 75 (L) >90 mL/min    Comment: (NOTE) The eGFR has been calculated using the CKD EPI equation. This calculation has not been validated in all clinical situations. eGFR's persistently <90 mL/min signify possible Chronic Kidney Disease.    Anion gap 7 5 - 15  CBC Status: Abnormal   Collection Time: 06/26/14 4:54 AM  Result Value Ref Range   WBC 6.2 4.0 - 10.5 K/uL   RBC 2.74 (L) 3.87 - 5.11 MIL/uL   Hemoglobin 8.3 (L) 12.0 - 15.0 g/dL   HCT 23.6 (L) 36.0 - 46.0 %   MCV 86.1 78.0 - 100.0 fL   MCH 30.3 26.0 - 34.0 pg   MCHC 35.2 30.0 - 36.0 g/dL   RDW 15.0 11.5 - 15.5 %   Platelets 155 150 - 400 K/uL  Magnesium Status: None   Collection Time: 06/26/14 4:54 AM  Result Value Ref Range   Magnesium 1.8 1.5 - 2.5 mg/dL  Glucose, capillary Status: Abnormal   Collection Time: 06/26/14 8:21 AM  Result Value Ref Range   Glucose-Capillary 137 (H) 70 - 99 mg/dL  Glucose, capillary Status: Abnormal   Collection Time: 06/26/14 12:08 PM  Result Value Ref Range   Glucose-Capillary 116 (H) 70 - 99 mg/dL  Glucose, capillary Status: Abnormal   Collection Time: 06/26/14 5:02 PM  Result Value Ref Range   Glucose-Capillary 167 (H) 70 - 99 mg/dL  Clostridium Difficile by PCR Status: None   Collection Time: 06/26/14 8:19 PM  Result Value Ref Range   C difficile by pcr NEGATIVE NEGATIVE  Glucose, capillary Status: Abnormal   Collection Time: 06/26/14 9:00 PM  Result Value Ref Range   Glucose-Capillary 102 (H) 70 - 99 mg/dL  Glucose, capillary Status: Abnormal   Collection Time: 06/26/14 11:59 PM  Result Value Ref Range   Glucose-Capillary 137 (H) 70 - 99  mg/dL  Glucose, capillary Status: Abnormal   Collection Time: 06/27/14 3:24 AM  Result Value Ref Range   Glucose-Capillary 125 (H) 70 - 99 mg/dL  Basic metabolic panel Status: Abnormal   Collection Time: 06/27/14 6:05 AM  Result Value Ref Range   Sodium 125 (L) 135 - 145 mmol/L    Comment: DELTA CHECK NOTED   Potassium 2.9 (L) 3.5 - 5.1 mmol/L   Chloride 100 96 - 112 mmol/L   CO2 19 19 - 32 mmol/L   Glucose, Bld 547 (H) 70 - 99 mg/dL   BUN <5 (L) 6 - 23 mg/dL   Creatinine, Ser 0.80 0.50 - 1.10 mg/dL   Calcium 8.1 (L) 8.4 - 10.5 mg/dL   GFR calc non Af Amer 72 (L) >90 mL/min   GFR calc Af  Amer 83 (L) >90 mL/min    Comment: (NOTE) The eGFR has been calculated using the CKD EPI equation. This calculation has not been validated in all clinical situations. eGFR's persistently <90 mL/min signify possible Chronic Kidney Disease.    Anion gap 6 5 - 15  CBC Status: Abnormal   Collection Time: 06/27/14 6:05 AM  Result Value Ref Range   WBC 5.6 4.0 - 10.5 K/uL   RBC 2.63 (L) 3.87 - 5.11 MIL/uL   Hemoglobin 8.1 (L) 12.0 - 15.0 g/dL   HCT 22.6 (L) 36.0 - 46.0 %   MCV 85.9 78.0 - 100.0 fL   MCH 30.8 26.0 - 34.0 pg   MCHC 35.8 30.0 - 36.0 g/dL   RDW 15.0 11.5 - 15.5 %   Platelets 160 150 - 400 K/uL  Magnesium Status: None   Collection Time: 06/27/14 6:05 AM  Result Value Ref Range   Magnesium 1.8 1.5 - 2.5 mg/dL  Glucose, capillary Status: Abnormal   Collection Time: 06/27/14 7:24 AM  Result Value Ref Range   Glucose-Capillary 125 (H) 70 - 99 mg/dL  Basic metabolic panel Status: Abnormal   Collection Time: 06/27/14 10:15 AM  Result Value Ref Range   Sodium 139 135 - 145 mmol/L   Potassium 3.8 3.5 - 5.1 mmol/L   Chloride 111 96 - 112 mmol/L   CO2 20 19 - 32 mmol/L   Glucose, Bld 173 (H) 70 - 99  mg/dL   BUN <5 (L) 6 - 23 mg/dL   Creatinine, Ser 0.92 0.50 - 1.10 mg/dL   Calcium 9.4 8.4 - 10.5 mg/dL   GFR calc non Af Amer 61 (L) >90 mL/min   GFR calc Af Amer 70 (L) >90 mL/min    Comment: (NOTE) The eGFR has been calculated using the CKD EPI equation. This calculation has not been validated in all clinical situations. eGFR's persistently <90 mL/min signify possible Chronic Kidney Disease.    Anion gap 8 5 - 15  Glucose, capillary Status: Abnormal   Collection Time: 06/27/14 11:58 AM  Result Value Ref Range   Glucose-Capillary 133 (H) 70 - 99 mg/dL      Imaging Results (Last 48 hours)    No results found.       Medical Problem List and Plan: 1. Functional deficits secondary toleft medial thalamic infarct 2. DVT Prophylaxis/Anticoagulation: Pharmaceutical: Lovenox 3. Pain Management: tylenol prn for pain 4. Mood: LCSW to follow for evaluation and support as mentation improves. 5. Neuropsych: This patient is not capable of making decisions on her own behalf. 6. Skin/Wound Care: Routine pressure relief measures.  7. Fluids/Electrolytes/Nutrition: Monitor I/O. Check lytes in am saturday  8. HTN: monitor BP every 8 hours. Continue lisinopril, aldactone and catapres. 9. Fevers: Continue high dose ceftriaxone for two weeks for presumed meningitis.  -also with recent UTI however last ucx neg, blood culture pending -consider abdominal CT Monday pending course -cbc tomorrow 10. ABLA?: Has had drop in H/H from 12--> 8.1. Question Hemodilution. Monitor for signs of bleeding. -recheck CBC in AM Saturday  11. Crohn's disease: Off azulfidine at this time. No complaints of abdominal distress/diarrhea 12. DM type 2: Monitor BS with achs checks. Off Januvia due to poor intake. Will use SSI for now and resume oral meds as indicated 13. CKD: Improved post hydration. Baseline Cr-1.5-1.9 in the past years.      Post Admission Physician  Evaluation: 1. Functional deficits secondary to left medial thalamic infarct 2. Patient is admitted to receive collaborative,  interdisciplinary care between the physiatrist, rehab nursing staff, and therapy team. 3. Patient's level of medical complexity and substantial therapy needs in context of that medical necessity cannot be provided at a lesser intensity of care such as a SNF. 4. Patient has experienced substantial functional loss from his/her baseline which was documented above under the "Functional History" and "Functional Status" headings. Judging by the patient's diagnosis, physical exam, and functional history, the patient has potential for functional progress which will result in measurable gains while on inpatient rehab. These gains will be of substantial and practical use upon discharge in facilitating mobility and self-care at the household level. 5. Physiatrist will provide 24 hour management of medical needs as well as oversight of the therapy plan/treatment and provide guidance as appropriate regarding the interaction of the two. 6. 24 hour rehab nursing will assist with bladder management, bowel management, safety, skin/wound care, disease management, medication administration, pain management and patient education and help integrate therapy concepts, techniques,education, etc. 7. PT will assess and treat for/with: Lower extremity strength, range of motion, stamina, balance, functional mobility, safety, adaptive techniques and equipment, cognitive perceptual awareness, family ed, stroke ed. Goals are: supervision to min assist. 8. OT will assess and treat for/with: ADL's, functional mobility, safety, upper extremity strength, adaptive techniques and equipment, NMR, cognitive perceptual awareness, family ed, stroke ed. Goals are: supervision to min ass. Therapy may  proceed with showering this patient. 9. SLP will assess and treat for/with: .cognition, language, communication,  swallowing. Goals are: min assist. 10. Case Management and Social Worker will assess and treat for psychological issues and discharge planning. 11. Team conference will be held weekly to assess progress toward goals and to determine barriers to discharge. 12. Patient will receive at least 3 hours of therapy per day at least 5 days per week. 13. ELOS: 10-16 days  14. Prognosis: good     Meredith Staggers, MD, [facility] 06/27/2014

## 2014-06-27 NOTE — Progress Notes (Signed)
Patient has met adequate criteria for discharge to inpatient rehab per MD order. Patient discharge summary was printed and given to daughter. Required education, follow up appointments, new medications and community resources were discussed with daughter and patient. Report was called to inpatient rehab RN. Patient was transferred via wheelchair escorted by floor medical tech with family and patient belongings.

## 2014-06-27 NOTE — Progress Notes (Addendum)
[facility]card for Infectious Disease    Subjective: "Im fine"   Antibiotics:  Anti-infectives    Start     Dose/Rate Route Frequency Ordered Stop   06/24/14 1700  cefTRIAXone (ROCEPHIN) 2 g in dextrose 5 % 50 mL IVPB - Premix     2 g 100 mL/hr over 30 Minutes Intravenous Every 12 hours 06/24/14 1529     06/24/14 1600  vancomycin (VANCOCIN) IVPB 750 mg/150 ml premix     750 mg 150 mL/hr over 60 Minutes Intravenous Every 12 hours 06/24/14 1537     06/21/14 1600  ampicillin (OMNIPEN) 2 g in sodium chloride 0.9 % 50 mL IVPB  Status:  Discontinued     2 g 150 mL/hr over 20 Minutes Intravenous 6 times per day 06/21/14 1235 06/22/14 1103   06/21/14 1400  acyclovir (ZOVIRAX) 600 mg in dextrose 5 % 100 mL IVPB  Status:  Discontinued     600 mg 112 mL/hr over 60 Minutes Intravenous 3 times per day 06/21/14 1235 06/22/14 1103   06/20/14 1500  vancomycin (VANCOCIN) 1,250 mg in sodium chloride 0.9 % 250 mL IVPB  Status:  Discontinued     1,250 mg 166.7 mL/hr over 90 Minutes Intravenous Every 24 hours 06/19/14 1349 06/20/14 1049   06/20/14 1400  vancomycin (VANCOCIN) IVPB 750 mg/150 ml premix  Status:  Discontinued     750 mg 150 mL/hr over 60 Minutes Intravenous Every 12 hours 06/20/14 1049 06/22/14 1103   06/20/14 1400  ampicillin (OMNIPEN) 2 g in sodium chloride 0.9 % 50 mL IVPB  Status:  Discontinued     2 g 150 mL/hr over 20 Minutes Intravenous Every 6 hours 06/20/14 1334 06/21/14 1235   06/20/14 0200  acyclovir (ZOVIRAX) 600 mg in dextrose 5 % 100 mL IVPB  Status:  Discontinued     600 mg 112 mL/hr over 60 Minutes Intravenous Every 12 hours 06/19/14 2313 06/21/14 1235   06/20/14 0000  ampicillin (OMNIPEN) 2 g in sodium chloride 0.9 % 50 mL IVPB  Status:  Discontinued     2 g 150 mL/hr over 20 Minutes Intravenous 4 times per day 06/19/14 2313 06/20/14 1334   06/19/14 2315  cefTRIAXone (ROCEPHIN) 2 g in dextrose 5 % 50 mL IVPB - Premix  Status:  Discontinued     2 g 100 mL/hr over  30 Minutes Intravenous Every 12 hours 06/19/14 2313 06/22/14 1103   06/19/14 1800  piperacillin-tazobactam (ZOSYN) IVPB 3.375 g  Status:  Discontinued     3.375 g 12.5 mL/hr over 240 Minutes Intravenous Every 8 hours 06/19/14 1350 06/19/14 2243   06/19/14 1400  vancomycin (VANCOCIN) 500 mg in sodium chloride 0.9 % 100 mL IVPB     500 mg 100 mL/hr over 60 Minutes Intravenous  Once 06/19/14 1349 06/19/14 1548   06/19/14 1215  piperacillin-tazobactam (ZOSYN) IVPB 3.375 g     3.375 g 100 mL/hr over 30 Minutes Intravenous  Once 06/19/14 1201 06/19/14 1329   06/19/14 1215  vancomycin (VANCOCIN) IVPB 1000 mg/200 mL premix  Status:  Discontinued     1,000 mg 200 mL/hr over 60 Minutes Intravenous  Once 06/19/14 1201 06/19/14 1349      Medications: Scheduled Meds: . aspirin EC  325 mg Oral Daily  . cefTRIAXone (ROCEPHIN)  IV  2 g Intravenous Q12H  . cloNIDine  0.1 mg Oral BID  . famotidine  20 mg Oral QHS  . heparin  5,000 Units Subcutaneous 3 times per  day  . hydrALAZINE  5 mg Intravenous Q6H  . insulin aspart  0-15 Units Subcutaneous 6 times per day  . lisinopril  10 mg Oral Daily  . simvastatin  40 mg Oral q1800  . sodium chloride  3 mL Intravenous Q12H  . spironolactone  25 mg Oral Daily  . vancomycin  750 mg Intravenous Q12H   Continuous Infusions:  PRN Meds:.acetaminophen **OR** acetaminophen, albuterol, alum & mag hydroxide-simeth, bisacodyl, hydrALAZINE, naLOXone (NARCAN)  injection, ondansetron **OR** ondansetron (ZOFRAN) IV, RESOURCE THICKENUP CLEAR, sodium chloride    Objective: Weight change:   Intake/Output Summary (Last 24 hours) at 06/27/14 1251 Last data filed at 06/27/14 0958  Gross per 24 hour  Intake      0 ml  Output   1875 ml  Net  -1875 ml   Blood pressure 118/53, pulse 55, temperature 98 F (36.7 C), temperature source Oral, resp. rate 18, height 5' 6"  (1.676 m), weight 162 lb 4.1 oz (73.6 kg), SpO2 100 %. Temp:  [97.4 F (36.3 C)-98.8 F (37.1 C)] 98 F  (36.7 C) (04/01 1140) Pulse Rate:  [55-86] 55 (04/01 1140) Resp:  [18-20] 18 (04/01 1140) BP: (118-153)/(47-65) 118/53 mmHg (04/01 1140) SpO2:  [100 %] 100 % (04/01 1140)  Physical Exam: General: Alert and awake, HEENT: anicteric sclera,, EOMI CVS regular rate, normal r,  no murmur rubs or gallops Chest: clear to auscultation bilaterally, no wheezing, rales or rhonchi Abdomen: soft nontender, nondistended, normal bowel sounds, Extremities: no ulcers, has onychomycotic nail on the left big toe Skin: no rashes  Neuro: nonfocal but confused  CBC: CBC Latest Ref Rng 06/27/2014 06/26/2014 06/24/2014  WBC 4.0 - 10.5 K/uL 5.6 6.2 7.5  Hemoglobin 12.0 - 15.0 g/dL 8.1(L) 8.3(L) 8.6(L)  Hematocrit 36.0 - 46.0 % 22.6(L) 23.6(L) 24.3(L)  Platelets 150 - 400 K/uL 160 155 175       BMET  Recent Labs  06/27/14 0605 06/27/14 1015  NA 125* 139  K 2.9* 3.8  CL 100 111  CO2 19 20  GLUCOSE 547* 173*  BUN <5* <5*  CREATININE 0.80 0.92  CALCIUM 8.1* 9.4     Liver Panel   Recent Labs  06/26/14 0454  PROT 4.9*  ALBUMIN 2.5*  AST 27  ALT 35  ALKPHOS 41  BILITOT 0.5       Sedimentation Rate No results for input(s): ESRSEDRATE in the last 72 hours. C-Reactive Protein No results for input(s): CRP in the last 72 hours.  Micro Results: Recent Results (from the past 720 hour(s))  Blood Culture (routine x 2)     Status: None   Collection Time: 06/19/14 12:23 PM  Result Value Ref Range Status   Specimen Description BLOOD LEFT ANTECUBITAL  Final   Special Requests BOTTLES DRAWN AEROBIC AND ANAEROBIC 3CC  Final   Culture   Final    NO GROWTH 5 DAYS Performed at [facility]    Report Status 06/25/2014 FINAL  Final  Blood Culture (routine x 2)     Status: None   Collection Time: 06/19/14 12:36 PM  Result Value Ref Range Status   Specimen Description BLOOD LEFT ANTECUBITAL  Final   Special Requests BOTTLES DRAWN AEROBIC AND ANAEROBIC 5ML  Final   Culture   Final     NO GROWTH 5 DAYS Performed at [facility]    Report Status 06/25/2014 FINAL  Final  Urine culture     Status: None   Collection Time: 06/19/14  2:32 PM  Result Value Ref Range Status   Specimen Description URINE, CLEAN CATCH  Final   Special Requests NONE  Final   Colony Count   Final    80,000 COLONIES/ML Performed at [facility]    Culture   Final    ESCHERICHIA COLI Performed at [facility]    Report Status 06/21/2014 FINAL  Final   Organism ID, Bacteria ESCHERICHIA COLI  Final      Susceptibility   Escherichia coli - MIC*    AMPICILLIN >=32 RESISTANT Resistant     CEFAZOLIN >=64 RESISTANT Resistant     CEFTRIAXONE <=1 SENSITIVE Sensitive     CIPROFLOXACIN >=4 RESISTANT Resistant     GENTAMICIN <=1 SENSITIVE Sensitive     LEVOFLOXACIN >=8 RESISTANT Resistant     NITROFURANTOIN <=16 SENSITIVE Sensitive     TOBRAMYCIN <=1 SENSITIVE Sensitive     TRIMETH/SULFA >=320 RESISTANT Resistant     PIP/TAZO >=128 RESISTANT Resistant     * ESCHERICHIA COLI  MRSA PCR Screening     Status: None   Collection Time: 06/19/14  8:30 PM  Result Value Ref Range Status   MRSA by PCR NEGATIVE NEGATIVE Final    Comment:        The GeneXpert MRSA Assay (FDA approved for NASAL specimens only), is one component of a comprehensive MRSA colonization surveillance program. It is not intended to diagnose MRSA infection nor to guide or monitor treatment for MRSA infections.   CSF culture     Status: None   Collection Time: 06/20/14 11:33 AM  Result Value Ref Range Status   Specimen Description CSF  Final   Special Requests NONE  Final   Gram Stain   Final    FEW WBC PRESENT, PREDOMINANTLY PMN NO ORGANISMS SEEN Gram Stain Report Called to,Read Back By and Verified With: Gram Stain Report Called to,Read Back By and Verified With: ALLISON DUNDON [id number]@1 :00AM BJENN Performed at [facility]    Culture   Final    NO GROWTH 3 DAYS Performed at [facility]    Report Status 06/24/2014 FINAL  Final  Gram stain     Status: None   Collection Time: 06/20/14 11:33 AM  Result Value Ref Range Status   Specimen Description CSF  Final   Special Requests NONE  Final   Gram Stain   Final    CYTOSPIN PREP WBC PRESENT, PREDOMINANTLY PMN NO ORGANISMS SEEN    Report Status 06/20/2014 FINAL  Final  Culture, blood (routine x 2)     Status: None (Preliminary result)   Collection Time: 06/24/14  6:44 PM  Result Value Ref Range Status   Specimen Description BLOOD LEFT HAND  Final   Special Requests BOTTLES DRAWN AEROBIC ONLY 3CC  Final   Culture   Final           BLOOD CULTURE RECEIVED NO GROWTH TO DATE CULTURE WILL BE HELD FOR 5 DAYS BEFORE ISSUING A FINAL NEGATIVE REPORT Performed at [facility]    Report Status PENDING  Incomplete  Culture, blood (routine x 2)     Status: None (Preliminary result)   Collection Time: 06/24/14  6:53 PM  Result Value Ref Range Status   Specimen Description BLOOD LEFT HAND  Final   Special Requests BOTTLES DRAWN AEROBIC AND ANAEROBIC 5CC  Final   Culture   Final           BLOOD CULTURE RECEIVED NO GROWTH TO DATE CULTURE WILL BE  HELD FOR 5 DAYS BEFORE ISSUING A FINAL NEGATIVE REPORT Performed at [facility]    Report Status PENDING  Incomplete  Culture, Urine     Status: None   Collection Time: 06/24/14  7:50 PM  Result Value Ref Range Status   Specimen Description URINE, RANDOM  Final   Special Requests Normal  Final   Colony Count NO GROWTH Performed at [facility]   Final   Culture NO GROWTH Performed at [facility]   Final   Report Status 06/26/2014 FINAL  Final  Clostridium Difficile by PCR     Status: None   Collection Time: 06/26/14  8:19 PM  Result Value Ref Range Status   C difficile by pcr NEGATIVE NEGATIVE Final    Studies/Results: No results found.    Assessment/Plan:  Principal Problem:   Sepsis Active Problems:   Crohn disease   Pelvic  mass   Back pain, chronic   DM (diabetes mellitus)   Acute encephalopathy   UTI (lower urinary tract infection)   Tobacco abuse   CVA (cerebral infarction)   Meningitis   Fall   Sepsis secondary to UTI   Altered mental status   Blood poisoning    Michelle Farrell is a 74 y.o. female with readmission after having been found down by her daughter. She underwent lumbar puncture and 24 hours after receiving IV antibiotics including vancomycin and ceftriaxone and Zosyn. CSF profile showed 28 white blood cells with 79% neutrophils protein of 85 and glucose of 85. She had improvement in her clinical status with her becoming afebrile and less confused. She was taken off antibiotics on March 28 but then became acutely febrile and more confused and was restarted on vancomycin and high-dose ceftriaxone she has had improvement since then fevers have defervesced and cognitively she seems to improve though she still is clearly delirious.  #1 Fever and confusion: HSV1 and 2 to PCR for negative MRI of the brain did not show temporal lobe enhancements and or any evidence of abscess. Her CSF profile was really not compelling for bacterial meningitis that she had a days worth of antibiotics prior to the lumbar puncture having been performed. I am skeptical that she has penicillin resistant pneumococcal meningitis. I propose stopping vancomycin but the daughter is quite reluctant to de-escalate care.  I also contemplated imaging her abdomen to look for other possible sources of infection that may have contributed to fevers. I wonder if the abnormal CSF profile could've been reflective of a seizure.  For now we'll continue vancomycin and ceftriaxone  I will see if there CSF available to add an enterovirus PCR  I will follow-up with her on Monday to see how she is doing.  We can consider a simple 2 week empiric course of therapy for pneumococcus with high-dose ceftriaxone plus or minus vancomycin.  After further  consideration of either hold off on imaging of the abdomen.  I am fine with her going to rehab today  Dr. Megan Salon available on the weekend for questions.     LOS: 8 days   Alcide Evener 06/27/2014, 12:51 PM

## 2014-06-27 NOTE — Treatment Plan (Signed)
Attempted to call pt's POA, [name], for update. She is getting accupuncture so will call back later.

## 2014-06-27 NOTE — Progress Notes (Signed)
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 right handed female with history of Crohn's disease, diastolic congestive heart failure, hypertension, diabetes mellitus with peripheral neuropathy. Patient with recent admission 06/11/2014 -06/16/2014 for nausea vomiting, AKI related to Crohn's disease. Presented 06/19/2014 after being found on the floor by her granddaughter with altered mental status and aphasia. Cranial CT scan negative for acute changes. CT cervical spine negative. MRI of the brain showed acute/subacute 10 mm nonhemorrhagic infarct within the posterior medial thalamus as well as remote medial left occipital lobe infarct. Hospital workup with urine culture greater than 80,000 Escherichia coli, blood cultures negative. White blood cell count 16,100. Echocardiogram with ejection fraction of 25% grade 1 diastolic dysfunction. EEG showed moderately severe generalized nonspecific slowing without seizure. Carotid Dopplers with no left ICA stenosis. Unable to visualize right ICA secondary to patient's inability to keep head turned. Lumbar puncture negative. Patient did not receive TPA. Placed on aspirin for CVA prophylaxis. Subcutaneous heparin for DVT prophylaxis. Presently on a dysphagia 2 nectar liquid diet. Physical and occupational therapy evaluations completed. M.D. has requested physical medicine rehabilitation consult.  Review of Systems  Unable to perform ROS: language   Past Medical History  Diagnosis Date  . CHF (congestive heart failure)   . Hypertension   . Back pain, chronic   . Hip pain, chronic   . GERD (gastroesophageal reflux disease)   . Crohn disease   . Diabetes mellitus without complication    Past Surgical History  Procedure Laterality Date  . Abdominal hysterectomy    .  Tonsillectomy    . Cholecystectomy    . Bladder surgery     Family History  Problem Relation Age of Onset  . Diabetes Mellitus II Other    Social History:  reports that she has been smoking. She has never used smokeless tobacco. She reports that she does not drink alcohol or use illicit drugs. Allergies: No Known Allergies Medications Prior to Admission  Medication Sig Dispense Refill  . Cholecalciferol (VITAMIN D) 2000 UNITS CAPS Take 1 capsule by mouth daily.    . cloNIDine (CATAPRES) 0.1 MG tablet Take 1 tablet (0.1 mg total) by mouth 2 (two) times daily. 60 tablet 11  . folic acid (FOLVITE) 1 MG tablet Take 1 mg by mouth daily.    Marland Kitchen gabapentin (NEURONTIN) 600 MG tablet Take 600 mg by mouth 3 (three) times daily.     . hydrALAZINE (APRESOLINE) 50 MG tablet Take 1 tablet (50 mg total) by mouth every 8 (eight) hours. 90 tablet 0  . HYDROcodone-acetaminophen (NORCO) 10-325 MG per tablet Take 1 tablet by mouth every 6 (six) hours as needed (for pain.).     Marland Kitchen lisinopril (PRINIVIL,ZESTRIL) 10 MG tablet Take 1 tablet (10 mg total) by mouth daily.    Marland Kitchen omeprazole (PRILOSEC) 20 MG capsule Take 20 mg by mouth every morning.     . ondansetron (ZOFRAN) 4 MG tablet Take 1 tablet (4 mg total) by mouth every 6 (six) hours as needed for nausea. 20 tablet 0  . potassium chloride SA (K-DUR,KLOR-CON) 20 MEQ tablet Take 20 mEq by mouth daily.    Marland Kitchen PROAIR HFA 108 (90 BASE) MCG/ACT inhaler Inhale 1 puff into the lungs every 6 (six) hours as needed. Shortness of breath or wheezing  3  . simvastatin (ZOCOR) 40 MG tablet Take 40 mg by mouth at bedtime.     Marland Kitchen  sitaGLIPtin (JANUVIA) 100 MG tablet Take 50 mg by mouth every morning.     Marland Kitchen spironolactone (ALDACTONE) 25 MG tablet Take 25 mg by mouth every morning.     . sulfaSALAzine (AZULFIDINE) 500 MG tablet Take 500 mg by mouth 4 (four) times daily.    . traZODone  (DESYREL) 100 MG tablet Take 200 mg by mouth at bedtime.   0    Home: Home Living Family/patient expects to be discharged to:: Private residence Living Arrangements: Alone, Other relatives Available Help at Discharge: Family (could possibly work out Clinical cytogeneticist) Type of Home: Apartment Home Access: Stairs to enter Technical brewer of Steps: [place]: One level [place]: [place] - single point, Environmental consultant - 2 wheels Lives With: Alone  Functional History: Prior Function Level of Independence: Independent Comments: has RW and cane but doesn't use per daughter Functional Status:  Mobility: Bed Mobility Overal bed mobility: Needs Assistance Bed Mobility: Supine to Sit, Sit to Supine Supine to sit: Min assist Sit to supine: Min assist General bed mobility comments: Decreased initiation. Assist to get pt started to lay down and then she was able to complete.  Transfers Overall transfer level: Needs assistance Equipment used: 1 person hand held assist Transfers: Sit to/from Stand, Stand Pivot Transfers Sit to Stand: Min assist, Min guard Stand pivot transfers: Min guard, Min assist General transfer comment: steadying due to equipment and lack of understandign of how to navigate her crowded environment Ambulation/Gait Ambulation/Gait assistance: Min assist, +2 safety/equipment (family to direct her focus) Ambulation Distance (Feet): 25 Feet Assistive device: 2 person hand held assist Gait Pattern/deviations: Step-through pattern, Decreased stride length, Shuffle, Ataxic Gait velocity: halting Gait velocity interpretation: Below normal speed for age/gender General Gait Details: shuffled ataxic and short steps    ADL: ADL Overall ADL's : Needs assistance/impaired Grooming: Maximal assistance, Wash/dry face, Applying deodorant, Sitting Grooming Details (indicate cue type and reason): Hand over hand to wash face. OT applied deodorant on one armpit and pt then able  to apply it to other arm. Upper Body Bathing: Maximal assistance, Sitting Lower Body Bathing: Maximal assistance, Sit to/from stand Upper Body Dressing : Maximal assistance, Sitting Lower Body Dressing: Maximal assistance, Sit to/from stand, Total assistance Toilet Transfer: Minimal assistance, Min guard, Stand-pivot, Ambulation (chair and bed) Functional mobility during ADLs: Min guard, Minimal assistance General ADL Comments: Pt having difficulty following commands.   Cognition: Cognition Overall Cognitive Status: Impaired/Different from baseline Arousal/Alertness: Lethargic Orientation Level: Other (comment) Attention: Sustained Sustained Attention: Impaired Sustained Attention Impairment: Verbal basic, Functional basic Memory: Impaired Awareness: Impaired Problem Solving: Impaired Problem Solving Impairment: Verbal basic, Functional basic Behaviors: Restless, Physical agitation, Verbal agitation, Impulsive Safety/Judgment: Impaired Cognition Arousal/Alertness: Awake/alert Behavior During Therapy: Impulsive Overall Cognitive Status: Impaired/Different from baseline Area of Impairment: Safety/judgement, Following commands, Attention, Problem solving Current Attention Level: Focused Memory: Decreased recall of precautions, Decreased short-term memory Following Commands: Follows one step commands inconsistently Safety/Judgement: Decreased awareness of safety Awareness: Anticipatory, Intellectual Problem Solving: Slow processing, Decreased initiation, Difficulty sequencing, Requires verbal cues, Requires tactile cues General Comments: Pt is trying to stand and continually scoots out farther on edge of bed  Blood pressure 165/64, pulse 67, temperature 98.8 F (37.1 C), temperature source Oral, resp. rate 14, height 5' 6"  (1.676 m), weight 72.575 kg (160 lb), SpO2 100 %. Physical Exam  HENT:  Head: Normocephalic.  Eyes: EOM are normal.  Negative nystagmus  Neck: Normal range  of motion. Neck supple. No thyromegaly present.  Cardiovascular: Normal rate  and regular rhythm.  Respiratory: Effort normal and breath sounds normal. No respiratory distress.  GI: Soft. Bowel sounds are normal. She exhibits no distension.  Musculoskeletal: She exhibits no edema.  Neurological: She is alert.  Makes eye contact with examiner. Patient is alert. Unable to consistently follow commands. When asked questions she perseverated on the answer: "uh-huh" using this expression for every question I asked until the end of our meeting. Moved all 4's with some preference of the right side over the left. Seemed to sense pain in the right more than the left.  Skin: Skin is warm and dry.  Psychiatric:  Confused, non-agitated     Lab Results Last 24 Hours    Results for orders placed or performed during the hospital encounter of 06/19/14 (from the past 24 hour(s))  Glucose, capillary Status: Abnormal   Collection Time: 06/22/14 4:18 PM  Result Value Ref Range   Glucose-Capillary 157 (H) 70 - 99 mg/dL   Comment 1 Notify RN    Comment 2 Document in Chart   Glucose, capillary Status: Abnormal   Collection Time: 06/22/14 8:16 PM  Result Value Ref Range   Glucose-Capillary 161 (H) 70 - 99 mg/dL  Glucose, capillary Status: Abnormal   Collection Time: 06/22/14 11:47 PM  Result Value Ref Range   Glucose-Capillary 113 (H) 70 - 99 mg/dL  Basic metabolic panel Status: Abnormal   Collection Time: 06/23/14 5:45 AM  Result Value Ref Range   Sodium 144 135 - 145 mmol/L   Potassium 4.6 3.5 - 5.1 mmol/L   Chloride 118 (H) 96 - 112 mmol/L   CO2 21 19 - 32 mmol/L   Glucose, Bld 116 (H) 70 - 99 mg/dL   BUN <5 (L) 6 - 23 mg/dL   Creatinine, Ser 0.93 0.50 - 1.10 mg/dL   Calcium 8.5 8.4 - 10.5 mg/dL   GFR calc non Af Amer 60 (L) >90 mL/min   GFR calc Af Amer 69 (L) >90 mL/min   Anion gap 5 5 -  15  CBC Status: Abnormal   Collection Time: 06/23/14 5:45 AM  Result Value Ref Range   WBC 8.2 4.0 - 10.5 K/uL   RBC 2.69 (L) 3.87 - 5.11 MIL/uL   Hemoglobin 8.2 (L) 12.0 - 15.0 g/dL   HCT 23.5 (L) 36.0 - 46.0 %   MCV 87.4 78.0 - 100.0 fL   MCH 30.5 26.0 - 34.0 pg   MCHC 34.9 30.0 - 36.0 g/dL   RDW 14.6 11.5 - 15.5 %   Platelets 151 150 - 400 K/uL  Glucose, capillary Status: Abnormal   Collection Time: 06/23/14 7:29 AM  Result Value Ref Range   Glucose-Capillary 113 (H) 70 - 99 mg/dL  Glucose, capillary Status: Abnormal   Collection Time: 06/23/14 11:45 AM  Result Value Ref Range   Glucose-Capillary 137 (H) 70 - 99 mg/dL      Imaging Results (Last 48 hours)    No results found.    Assessment/Plan: Diagnosis: right thalamic infarct 1. Does the need for close, 24 hr/day medical supervision in concert with the patient's rehab needs make it unreasonable for this patient to be served in a less intensive setting? Yes and Potentially 2. Co-Morbidities requiring supervision/potential complications: dm, sepsis, encephalopathy 3. Due to bladder management, bowel management, safety, skin/wound care, disease management, medication administration, pain management and patient education, does the patient require 24 hr/day rehab nursing? Yes 4. Does the patient require coordinated care of a physician, rehab nurse, PT (1-2 hrs/day,  5 days/week), OT (1-2 hrs/day, 5 days/week) and SLP (1-2 hrs/day, 5 days/week) to address physical and functional deficits in the context of the above medical diagnosis(es)? Yes Addressing deficits in the following areas: balance, endurance, locomotion, strength, transferring, bowel/bladder control, bathing, dressing, feeding, grooming, toileting, cognition, language, swallowing and psychosocial support 5. Can the patient actively participate in an intensive therapy program of at least 3 hrs of  therapy per day at least 5 days per week? Yes 6. The potential for patient to make measurable gains while on inpatient rehab is excellent 7. Anticipated functional outcomes upon discharge from inpatient rehab are supervision with PT, supervision with OT, supervision and min assist with SLP. 8. Estimated rehab length of stay to reach the above functional goals is: 10-18 days 9. Does the patient have adequate social supports and living environment to accommodate these discharge functional goals? Yes 10. Anticipated D/C setting: Home 11. Anticipated post D/C treatments: [place] therapy 12. Overall Rehab/Functional Prognosis: good  RECOMMENDATIONS: This patient's condition is appropriate for continued rehabilitative care in the following setting: CIR Patient has agreed to participate in recommended program. N/A Note that insurance prior authorization may be required for reimbursement for recommended care.  Comment: Rehab Admissions Coordinator to follow up.  Thanks,  Meredith Staggers, MD, Mellody Drown     06/23/2014       Revision History     Date/Time User Provider Type Action   06/23/2014 4:03 PM Meredith Staggers, MD Physician Sign   06/23/2014 12:54 PM Cathlyn Parsons, PA-C Physician Assistant Pend   View Details Report       Routing History     Date/Time From To Method   06/23/2014 4:03 PM Meredith Staggers, MD Meredith Staggers, MD In Basket   06/23/2014 4:03 PM Meredith Staggers, MD Hermelinda Medicus, MD Fax

## 2014-06-27 NOTE — Progress Notes (Signed)
PMR Admission Coordinator Pre-Admission Assessment  Patient: Michelle Farrell is an 74 y.o., female MRN: [id number] DOB: July 24, 1940 Height: 5' 6"  (167.6 cm) Weight: 73.6 kg (162 lb 4.1 oz)  Insurance Information HMO: yes PPO: PCP: IPA: 80/20: OTHER:  PRIMARY: U[employer] Medicare Policy#: [id number] Subscriber: self CM Name: Katrina Phone#: [phone]. [phone] Fax#: [phone] Pre-Cert#: [id number] Employer: Retired to be followed by Colbert Coyer at [phone] Benefits: Phone #: [phone] Name: online and Jetta Lout. Date: 03/28/14 Deduct: $0 Out of Pocket Max: [phone] Life Max: none CIR: $430 per day, days 1-4 only; $0, day 5 and beyond SNF: $0 days 1-20; $160 days 21-62; $0 days 63-100 Outpatient: 100% Co-Pay: $40 copay Home Health: 100% Co-Pay:  DME: 80%/20% Co-Pay:  Providers: In network   Medicaid Application Date: Case Manager:  Disability Application Date: Case Worker:   Emergency Facilities manager Information    Name Relation Home Work Mobile   [place] Grandaughter [phone]       Current Medical History  Patient Admitting Diagnosis: right thalamic infarct  History of Present Illness: Michelle Farrell is a 74 y.o. right handed female with history of Crohn's disease, tobacco abuse, diastolic congestive heart failure, hypertension, diabetes mellitus with peripheral neuropathy. Patient with recent admission 06/11/2014 -06/16/2014 for nausea vomiting, AKI related to Crohn's disease. Presented 06/19/2014 after being found on the floor by her granddaughter with altered mental status and aphasia. Cranial CT scan negative for acute changes. CT cervical spine negative. MRI of the brain showed acute/subacute  10 mm nonhemorrhagic infarct within the posterior medial thalamus as well as remote medial left occipital lobe infarct. Hospital workup with urine culture greater than 80,000 Escherichia coli, blood cultures negative. White blood cell count 16,100. Echocardiogram with ejection fraction of 38% grade 1 diastolic dysfunction. EEG showed moderately severe generalized nonspecific slowing without seizure. Carotid Dopplers with no left ICA stenosis. Unable to visualize right ICA secondary to patient's inability to keep head turned. Lumbar puncture negative. Patient did not receive TPA. Patient off antibiotics for 48 hours with a change in mental status noted along with low grade fevers. Per ID, antibiotics restarted 06/24/14. Blood and urine cultures pending. Patient had issues with lethargy and keppra d/c 3/17. Pt had recurrent fevers and lethargy 48 hrs past stopping empiric antibiotics and she was pan cultured with resumption of vancomycin and Ceftriaxone. Stools negative for c diff. I With Dr. Tommy Medal will continue to follow patient while in CIR.  Pt with hypokalemia with IVF normal saline with 20 kcl at 75 cc per hour. Magnesium replaced.   Placed on aspirin for CVA prophylaxis. Subcutaneous heparin for DVT prophylaxis. Presently on a dysphagia 2 nectar liquid diet. Physical and occupational therapy evaluations completed. M.D. has requested physical medicine rehabilitation consult. Patient was admitted for comprehensive rehabilitation program   Total: 18 [facility]    Past Medical History  Past Medical History  Diagnosis Date  . CHF (congestive heart failure)   . Hypertension   . Back pain, chronic   . Hip pain, chronic   . GERD (gastroesophageal reflux disease)   . Crohn disease   . Diabetes mellitus without complication   . Esophageal stricture     s/p dilation 2003, 2008  . PUD (peptic ulcer disease)   . CKD (chronic kidney disease)   . COPD (chronic  obstructive pulmonary disease)     Family History  family history includes Diabetes Mellitus II in her other.  Prior Rehab/Hospitalizations: Pt. Had just begun receiving HHPT prior to this  admission. She has had 2 recent hospitalizations.   Current Medications   Current facility-administered medications:  . acetaminophen (TYLENOL) tablet 650 mg, 650 mg, Oral, Q6H PRN **OR** acetaminophen (TYLENOL) suppository 650 mg, 650 mg, Rectal, Q6H PRN, Bobby Rumpf York, PA-C, 650 mg at 06/24/14 1419 . albuterol (PROVENTIL) (2.5 MG/3ML) 0.083% nebulizer solution 2.5 mg, 2.5 mg, Inhalation, Q6H PRN, Melton Alar, PA-C . alum & mag hydroxide-simeth (MAALOX/MYLANTA) 200-200-20 MG/5ML suspension 30 mL, 30 mL, Oral, Q6H PRN, Bobby Rumpf York, PA-C . aspirin EC tablet 325 mg, 325 mg, Oral, Daily, Oswald Hillock, MD, 325 mg at 06/27/14 0854 . bisacodyl (DULCOLAX) suppository 10 mg, 10 mg, Rectal, Daily PRN, Bobby Rumpf York, PA-C . cefTRIAXone (ROCEPHIN) 2 g in dextrose 5 % 50 mL IVPB - Premix, 2 g, Intravenous, Q12H, Michel Bickers, MD, 2 g at 06/27/14 0546 . cloNIDine (CATAPRES) tablet 0.1 mg, 0.1 mg, Oral, BID, Oswald Hillock, MD, 0.1 mg at 06/27/14 0854 . famotidine (PEPCID) tablet 20 mg, 20 mg, Oral, QHS, Oswald Hillock, MD, 20 mg at 06/26/14 2220 . heparin injection 5,000 Units, 5,000 Units, Subcutaneous, 3 times per day, Greta Doom, MD, 5,000 Units at 06/27/14 0546 . hydrALAZINE (APRESOLINE) injection 10 mg, 10 mg, Intravenous, Q6H PRN, Melton Alar, PA-C, 10 mg at 06/19/14 2328 . hydrALAZINE (APRESOLINE) injection 5 mg, 5 mg, Intravenous, Q6H, Theodis Blaze, MD, 5 mg at 06/27/14 0547 . insulin aspart (novoLOG) injection 0-15 Units, 0-15 Units, Subcutaneous, 6 times per day, Melton Alar, PA-C, 2 Units at 06/27/14 [phone] . lisinopril (PRINIVIL,ZESTRIL) tablet 10 mg, 10 mg, Oral, Daily, Oswald Hillock, MD, 10 mg at 06/27/14 0853 . naloxone [facility]) injection 0.4 mg, 0.4 mg,  Intravenous, PRN, Melton Alar, PA-C, 0.4 mg at 06/19/14 1757 . ondansetron (ZOFRAN) tablet 4 mg, 4 mg, Oral, Q6H PRN **OR** ondansetron (ZOFRAN) injection 4 mg, 4 mg, Intravenous, Q6H PRN, Melton Alar, PA-C . RESOURCE THICKENUP CLEAR, , Oral, PRN, Oswald Hillock, MD . simvastatin (ZOCOR) tablet 40 mg, 40 mg, Oral, q1800, David L Rinehuls, PA-C, 40 mg at 06/26/14 1711 . sodium chloride 0.9 % injection 10-40 mL, 10-40 mL, Intracatheter, PRN, Theodis Blaze, MD, 10 mL at 06/27/14 0547 . sodium chloride 0.9 % injection 3 mL, 3 mL, Intravenous, Q12H, Marianne L York, PA-C, 3 mL at 06/27/14 0854 . spironolactone (ALDACTONE) tablet 25 mg, 25 mg, Oral, Daily, Oswald Hillock, MD, 25 mg at 06/27/14 0853 . vancomycin (VANCOCIN) IVPB 750 mg/150 ml premix, 750 mg, Intravenous, Q12H, Franky Macho, RPH, 750 mg at 06/27/14 0403  Patients Current Diet: DIET DYS 2 Room service appropriate?: Yes with Assist; Fluid consistency:: Thin  Precautions / Restrictions Precautions Precautions: Fall Precaution Comments: Continues to have some urgency with urination and stilll impulsive with OOB Restrictions Weight Bearing Restrictions: No   Prior Activity Level Limited Community (1-2x/wk): Pt. able to drive PTA. Goes out to [employer], MD appointments and church . Is usually out of the home 2-3x/week   [place] Devices/Equipment: Environmental consultant (specify type), Dentures (specify type), Cane (specify quad or straight), CBG Meter, Eyeglasses Home Equipment: Cane - single point, Environmental consultant - 2 wheels  Prior Functional Level Prior Function Level of Independence: Independent Comments: has RW and cane but doesn't use per daughter  Current Functional Level Cognition  Arousal/Alertness: Lethargic Overall Cognitive Status: Impaired/Different from baseline Difficult to assess due to: Level of arousal Current Attention Level: Selective Orientation Level: Oriented  to person,  Other (comment) Following Commands: Follows one step commands inconsistently Safety/Judgement: Decreased awareness of safety, Decreased awareness of deficits General Comments: Pt is trying to sit initially but apparenlty was convfused about BSC thinking she should sit every time she saw it. Attention: Sustained Sustained Attention: Impaired Sustained Attention Impairment: Verbal basic, Functional basic Memory: Impaired Awareness: Impaired Problem Solving: Impaired Problem Solving Impairment: Verbal basic, Functional basic Behaviors: Restless, Physical agitation, Verbal agitation, Impulsive Safety/Judgment: Impaired   Extremity Assessment (includes Sensation/Coordination)  Upper Extremity Assessment: Generalized weakness, Difficult to assess due to impaired cognition  Lower Extremity Assessment: Defer to PT evaluation    ADLs  Overall ADL's : Needs assistance/impaired Grooming: Wash/dry face, Moderate assistance, Sitting Grooming Details (indicate cue type and reason): hand over hand assist. VC's for sequencing and commands.  Upper Body Bathing: Maximal assistance, Sitting Lower Body Bathing: Maximal assistance, Sit to/from stand Upper Body Dressing : Maximal assistance, Sitting Lower Body Dressing: Maximal assistance, Sit to/from stand, Total assistance Toilet Transfer: Minimal assistance, Stand-pivot, BSC Toilet Transfer Details (indicate cue type and reason): min A to balance and direct actions.  Functional mobility during ADLs: Min guard, Minimal assistance General ADL Comments: Pt continues to have difficulty following commands. Pt responded "Yeah" when asked if she needed to use the bathroom. Pt get to EOB and required min A to stand. Once standing, pt appeared not to know what she was doing. Directed pt to sit on [facility] and pt turned and instead sat on bed stating "I don't have to use the bathroom." Again when asked "Do you need to use the bathroom?" Pt stated "Yeah." Pt sat  EOB and worked on following commands. Pt is perseverative and repeating actions ("touch your nose") and having difficulty moving on from actions.     Mobility  Overal bed mobility: Needs Assistance Bed Mobility: Supine to Sit, Sit to Supine Rolling: Min assist, Min guard Sidelying to sit: Max assist Supine to sit: Min guard Sit to supine: Min guard General bed mobility comments: No physical assist needed but min guard for safety. Increased time required.     Transfers  Overall transfer level: Needs assistance Equipment used: 1 person hand held assist Transfers: Sit to/from Stand, Stand Pivot Transfers Sit to Stand: Min assist Stand pivot transfers: Min assist General transfer comment: VC's for sequencing. Pt with decreased activity tolerance.     Ambulation / Gait / Stairs / Wheelchair Mobility  Ambulation/Gait Ambulation/Gait assistance: Museum/gallery curator (Feet): 90 Feet Assistive device: 1 person hand held assist (IV pole) Gait Pattern/deviations: Step-through pattern, Shuffle, Wide base of support, Drifts right/left Gait velocity: slow Gait velocity interpretation: Below normal speed for age/gender General Gait Details: more fluid pattern today but limited endurance    Posture / Balance Dynamic Sitting Balance Sitting balance - Comments: SIts with pt touching bedrail or end of bed but no LOB Balance Overall balance assessment: Needs assistance Sitting-balance support: Feet supported Sitting balance-Leahy Scale: Good Sitting balance - Comments: SIts with pt touching bedrail or end of bed but no LOB Postural control: Posterior lean Standing balance support: Single extremity supported Standing balance-Leahy Scale: Fair Standing balance comment: fair- dynamic    Special needs/care consideration BiPAP/CPAP no CPM no Continuous Drip IV NS with 20 KCL at 75 cc per hour Dialysis no  Life Vest no Oxygen no Special Bed no Trach  Size No  Wound Vac (area) no  Skin Granddaughter denies  Bowel mgmt: last BM 06/27/14 Bladder mgmt: continent with urinary urgency  since UTI, using BSC with assist Diabetic mgmt Yes Hgb A1C 5.3   Previous Home Environment Living Arrangements: Alone, Other relatives Lives With: Alone Available Help at Discharge: Family, Other (Comment) (granddaughter works from home, can arrange 24 hour care) Type of Home: Apartment Home Layout: One level Home Access: Stairs to enter Technical brewer of Steps: 3 Bathroom Shower/Tub: Chiropodist: [place]: No  Discharge Living Setting Plans for Discharge Living Setting: House Type of Home at Discharge: House Discharge Home Layout: Two level, 1/2 bath on main level, Other (Comment) (granddaughter will arrange for pt. to sleep on main level) Alternate Level Stairs-Number of Steps: flight Discharge Home Access: Stairs to enter Entrance Stairs-Rails: Right, Left Entrance Stairs-Number of Steps: 4-5 Discharge Bathroom Shower/Tub: Tub/shower unit, Walk-in shower, Other (comment) (on second level; half bath on main level) Discharge Bathroom Toilet: Standard Discharge Bathroom Accessibility: Yes How Accessible: Accessible via walker Does the patient have any problems obtaining your medications?: No  Social/Family/Support Systems Patient Roles: Other (Comment) (Pt. and granddaughter are very close) Anticipated Caregiver: Gypsy Lore, granddaughter Anticipated Caregiver's Contact Information: [phone] Ability/Limitations of Caregiver: [name] works from home. She states she plans to enlist help from family and hire assist during the several hours during the day that her work is most intense. Caregiver Availability: 24/7 Discharge Plan Discussed with Primary Caregiver: Yes Is Caregiver In Agreement with Plan?: Yes Does Caregiver/Family have Issues with  Lodging/Transportation while Pt is in Rehab?: No  Granddaughter, Evvana Providence Lanius), is a Designer, jewellery  Goals/Additional Needs Patient/Family Goal for Rehab: supervision for PT/OT; supervision and min assist for SLP Expected length of stay: 10-18 days Cultural Considerations: none per granddaughter Equipment Needs: TBA Pt/Family Agrees to Admission and willing to participate: Yes Program Orientation Provided & Reviewed with Pt/Caregiver Including Roles & Responsibilities: Yes  Decrease burden of Care through IP rehab admission: no   Possible need for SNF placement upon discharge: Not anticipated  Patient Condition: This patient's medical and functional status has changed since the consult dated: 06/23/2014 in which the Rehabilitation Physician determined and documented that the patient's condition is appropriate for intensive rehabilitative care in an inpatient rehabilitation facility. See "History of Present Illness" (above) for medical update. Functional changes are: min to mod assist with mac cueing. Patient's medical and functional status update has been discussed with the Rehabilitation physician and patient remains appropriate for inpatient rehabilitation. Will admit to inpatient rehab today.  Preadmission Screen Completed By: Cleatrice Burke, 06/27/2014 1:40 PM ______________________________________________________________________  Discussed status with Dr. Naaman Plummer on 06/27/2014 at 1340 and received telephone approval for admission today.  Admission Coordinator: Cleatrice Burke, time 7824 Date 06/27/2014          Cosigned by: Meredith Staggers, MD at 06/27/2014 2:05 PM  Revision History     Date/Time User Provider Type Action   06/27/2014 2:05 PM Meredith Staggers, MD Physician Cosign   06/27/2014 1:40 PM Cristina Gong, RN Rehab Admission Coordinator Sign   06/27/2014 1:07 PM Cristina Gong, RN Rehab Admission Coordinator Share   06/27/2014 12:50 PM  Cristina Gong, RN Rehab Admission Coordinator Share   06/27/2014 12:45 PM Cristina Gong, RN Rehab Admission Coordinator Share   06/25/2014 3:48 PM Gerlean Ren Rehab Admission Coordinator Share   View Details Report

## 2014-06-28 ENCOUNTER — Inpatient Hospital Stay (HOSPITAL_COMMUNITY): Payer: Medicare Other | Admitting: Occupational Therapy

## 2014-06-28 ENCOUNTER — Inpatient Hospital Stay (HOSPITAL_COMMUNITY): Payer: Medicare Other | Admitting: Physical Therapy

## 2014-06-28 ENCOUNTER — Inpatient Hospital Stay (HOSPITAL_COMMUNITY): Payer: Medicare Other | Admitting: Speech Pathology

## 2014-06-28 DIAGNOSIS — I10 Essential (primary) hypertension: Secondary | ICD-10-CM

## 2014-06-28 LAB — CBC WITH DIFFERENTIAL/PLATELET
Basophils Absolute: 0 10*3/uL (ref 0.0–0.1)
Basophils Relative: 0 % (ref 0–1)
Eosinophils Absolute: 0.2 10*3/uL (ref 0.0–0.7)
Eosinophils Relative: 3 % (ref 0–5)
HEMATOCRIT: 24.9 % — AB (ref 36.0–46.0)
Hemoglobin: 8.6 g/dL — ABNORMAL LOW (ref 12.0–15.0)
LYMPHS PCT: 46 % (ref 12–46)
Lymphs Abs: 2.6 10*3/uL (ref 0.7–4.0)
MCH: 30 pg (ref 26.0–34.0)
MCHC: 34.5 g/dL (ref 30.0–36.0)
MCV: 86.8 fL (ref 78.0–100.0)
MONO ABS: 0.6 10*3/uL (ref 0.1–1.0)
MONOS PCT: 11 % (ref 3–12)
Neutro Abs: 2.3 10*3/uL (ref 1.7–7.7)
Neutrophils Relative %: 40 % — ABNORMAL LOW (ref 43–77)
PLATELETS: 176 10*3/uL (ref 150–400)
RBC: 2.87 MIL/uL — ABNORMAL LOW (ref 3.87–5.11)
RDW: 15.3 % (ref 11.5–15.5)
WBC: 5.7 10*3/uL (ref 4.0–10.5)

## 2014-06-28 LAB — COMPREHENSIVE METABOLIC PANEL
ALBUMIN: 2.8 g/dL — AB (ref 3.5–5.2)
ALT: 28 U/L (ref 0–35)
AST: 21 U/L (ref 0–37)
Alkaline Phosphatase: 46 U/L (ref 39–117)
Anion gap: 3 — ABNORMAL LOW (ref 5–15)
CALCIUM: 9.2 mg/dL (ref 8.4–10.5)
CHLORIDE: 113 mmol/L — AB (ref 96–112)
CO2: 22 mmol/L (ref 19–32)
Creatinine, Ser: 0.85 mg/dL (ref 0.50–1.10)
GFR calc non Af Amer: 67 mL/min — ABNORMAL LOW (ref >90)
GFR, EST AFRICAN AMERICAN: 77 mL/min — AB (ref >90)
Glucose, Bld: 121 mg/dL — ABNORMAL HIGH (ref 70–99)
POTASSIUM: 3.7 mmol/L (ref 3.5–5.1)
Sodium: 138 mmol/L (ref 135–145)
TOTAL PROTEIN: 5.2 g/dL — AB (ref 6.0–8.3)
Total Bilirubin: 0.4 mg/dL (ref 0.3–1.2)

## 2014-06-28 LAB — GLUCOSE, CAPILLARY
GLUCOSE-CAPILLARY: 162 mg/dL — AB (ref 70–99)
GLUCOSE-CAPILLARY: 115 mg/dL — AB (ref 70–99)
Glucose-Capillary: 119 mg/dL — ABNORMAL HIGH (ref 70–99)
Glucose-Capillary: 198 mg/dL — ABNORMAL HIGH (ref 70–99)

## 2014-06-28 LAB — VANCOMYCIN, TROUGH: Vancomycin Tr: 20.1 ug/mL — ABNORMAL HIGH (ref 10.0–20.0)

## 2014-06-28 NOTE — Progress Notes (Signed)
Pt had an episode of confusion at around 12:30 am. Pt agitated and confused. Yelling out every few minutes. RN decreased sounds and lights at nursing station and pt seemed to calm down.

## 2014-06-28 NOTE — Progress Notes (Signed)
Patient ID: Michelle Farrell, female   DOB: January 11, 1941, 74 y.o.   MRN: [id number]   06/28/14.  HPI: Michelle Farrell is a 74 y.o. female with DM, chronic pain, hypertension, Crohn's disease, recent discharged on 06/16/14 past admission for gastroenteritis with dehydration. She was readmitted on 06/19/14 past found unresponsive.  Dr.Kirpatick recommended Keppra as well as LP due to concerns of meningitis or encephalitis.CSF and blood cultures have remained negative.  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  ID consulted for input and recommended d/c antibiotics and monitoring for recurrent fevers. Patient has had issues with lethargy and keppra d/c 3/27 per neurology input. Patient had recurrent fevers with lethargy 48 hours past stopping empiric antibiotics and she was pan cultured with resumption of Vancomycin and Ceftriaxone. Dr Tommy Medal recommended d/c antibiotics and repeat LP but family reluctant to deescalate care. He recommends two weeks of empiric course of high dose ceftriaxone plus/minus Vancomycin. Therapy ongoing and patient has difficulty following 1 step commands as well as confusion and inability to answer basic Y/N questions. Has safety awareness, perseverative behaviors as well as impaired mobility. CIR recommended by MD and Rehab team and patient cleared for admission 06/27/14.   Past Medical History  Diagnosis Date  . CHF (congestive heart failure)   . Hypertension   . Back pain, chronic   . Hip pain, chronic   . GERD (gastroesophageal reflux disease)   . Crohn disease   . Diabetes mellitus without complication   . Esophageal stricture     s/p dilation 2003, 2008  . PUD (peptic ulcer disease)   . CKD (chronic kidney disease)   . COPD (chronic obstructive pulmonary disease)              Physical Exam: Blood pressure 118/53, pulse  55, temperature 98 F (36.7 C), temperature source Oral, resp. rate 18, height 5' 6"  (1.676 m), weight 73.6 kg (162 lb 4.1 oz), SpO2 100 %. Physical Exam  Nursing note and vitals reviewed. Constitutional: She appears well-developed and well-nourished.  HENT: dentition fair Head: Normocephalic and atraumatic.  Eyes: Conjunctivae are normal. Pupils are equal, round, and reactive to light.  Neck: Normal range of motion. Neck supple.  Cardiovascular: Normal rate and regular rhythm.no murmur or gallop  Respiratory: Effort normal and breath sounds normal. No wheezes GI: Soft. Bowel sounds are normal. She exhibits no distension. There is no tenderness.  Musculoskeletal: She exhibits tenderness. She exhibits no edema.  Neurological: She is alert.  Oriented to self only. Looked straight ahead and needed constant cues to make eye contact. Tardrive dyskinesia--kept smacking her lips. Was able to state her name and age but perseverated on "42" thorough out the rest of exam. Unable to follow simple motor commands without visual and tactile cues. Moves Left side > right. Difficult to engage Skin: Skin is warm and dry.  Psych: quiet, withdrawn  Patient Vitals for the past 24 hrs:  BP Temp Temp src Pulse Resp SpO2  06/28/14 0823 (!) 139/53 mmHg - - 60 - 100 %  06/28/14 0524 (!) 110/40 mmHg - - - - -  06/28/14 0514 - 99.4 F (37.4 C) Oral (!) 55 18 100 %  06/27/14 1614 (!) 137/53 mmHg 98.5 F (36.9 C) Oral (!) 53 18 100 %     Medical Problem List and Plan: 1. Functional deficits secondary toleft medial thalamic infarct 2. DVT Prophylaxis/Anticoagulation: Pharmaceutical: Lovenox 3. Pain Management: tylenol prn for pain 4. Mood: LCSW  to follow for evaluation and support as mentation improves. 5. Neuropsych: This patient is not capable of making decisions on her own behalf. 6. Skin/Wound Care: Routine pressure relief measures.  7. Fluids/Electrolytes/Nutrition: Monitor I/O.  Sodium has  improved from 125-138; potassium increased from 2.9 to 3.7; much improved glycemic control with glucose 547 yesterday and 121 this morning.  She is 8. HTN: monitor BP every 8 hours. Continue lisinopril, aldactone and catapres. 9. Fevers: Continue high dose ceftriaxone for two weeks for presumed meningitis.  -also with recent UTI however last ucx neg, blood culture pending -consider abdominal CT Monday pending course -cbc tomorrow 10. ABLA?: Has had drop in H/H from 12--> 8.1. Question Hemodilution. Monitor for signs of bleeding. Repeat H/H today 8.6/24.9 11. Crohn's disease: Off azulfidine at this time. No complaints of abdominal distress/diarrhea 12. DM type 2: Monitor BS with achs checks. Off Januvia due to poor intake. Will use SSI for now and resume oral meds as indicated 13. CKD: Improved post hydration. Baseline Cr-1.5-1.9 in the past years.

## 2014-06-28 NOTE — Evaluation (Signed)
Occupational Therapy Assessment and Plan  Patient Details  Name: Michelle Farrell MRN: [id number] Date of Birth: 1940/12/31  OT Diagnosis: altered mental status, cognitive deficits, disturbance of vision and muscle weakness (generalized) Rehab Potential: Rehab Potential (ACUTE ONLY): Fair ELOS: 8-10 days   Today's Date: 06/28/2014 OT Individual Time: 4098-1191 OT Individual Time Calculation (min): 68 min     Problem List:  Patient Active Problem List   Diagnosis Date Noted  . 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 Altered mental status   . Blood poisoning   . Fall   . Sepsis secondary to UTI   . Meningitis   . CVA (cerebral infarction)   . Sepsis 06/19/2014  . Acute encephalopathy 06/19/2014  . UTI (lower urinary tract infection) 06/19/2014  . Tobacco abuse 06/19/2014  . Acute gastroenteritis 06/12/2014  . Crohn's disease 06/12/2014  . DM (diabetes mellitus) 06/12/2014  . Metabolic acidosis 47/82/9562  . Hypercalcemia 06/12/2014  . Oral candidiasis 06/12/2014  . Renal failure 05/16/2014  . AKI (acute kidney injury) 05/16/2014  . Dehydration 05/16/2014  . Pelvic mass 05/16/2014  . Hypertension 05/16/2014  . Essential hypertension 05/16/2014  . Diastolic CHF 13/10/6576  . Back pain, chronic 05/16/2014  . Accelerated hypertension 08/28/2013  . DKA (diabetic ketoacidoses) 08/27/2013  . OBSTRUCTIVE SLEEP APNEA 11/14/2008  . DYSPNEA 11/14/2008  . Diabetes mellitus 06/28/2007  . HYPERLIPIDEMIA 06/28/2007  . NEUROPATHY 06/28/2007  . CHF 06/28/2007  . ESOPHAGEAL STRICTURE 06/28/2007  . HIATAL HERNIA 06/28/2007  . Crohn disease 06/28/2007  . POLYARTHRITIS 06/28/2007  . ELEVATED BLOOD PRESSURE 06/28/2007  . ALLERGY 06/28/2007  . PUD, HX OF 06/28/2007    Past Medical History:  Past Medical History  Diagnosis Date  . CHF (congestive heart failure)   . Hypertension   . Back pain, chronic   . Hip pain, chronic   . GERD (gastroesophageal reflux disease)   . Crohn  disease   . Diabetes mellitus without complication   . Esophageal stricture     s/p dilation 2003, 2008  . PUD (peptic ulcer disease)   . CKD (chronic kidney disease)   . COPD (chronic obstructive pulmonary disease)    Past Surgical History:  Past Surgical History  Procedure Laterality Date  . Abdominal hysterectomy    . Tonsillectomy    . Cholecystectomy    . Bladder surgery      Assessment & Plan Clinical Impression: Michelle Farrell is a 74 y.o. female with DM, chronic pain, hypertension, Crohn's disease, recent discharged on 06/16/14 past admission for gastroenteritis with dehydration.   She was readmitted on 06/19/14 past found unresponsive on the floor by her granddaughter.  She was febrile with elevated lactate and UA with 3-6 WBCs and + nitrites.  Family occasional twitching and patient with neck stiffness and Dr.Kirpatick recommended Keppra as well as LP due to concerns of meningitis or encephalitis.  She was started on Vanc, ceftriaxone, ampicillin and acyclovir.   Urine culture grew 80,000 colonies E coli but AMS persisted.    LP done then by IR on day 2 of hospitalization and noted 28 WBCs with 79% neutrophils, mildly elevated glucose and mildly elevated protein to 85 with some RBCs in sample.  CSF and blood cultures have remained negative.  HSV negative.  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 Carotid dopplers without significant ICA stenosis. 2D echo with EF 60-65% and grade 1 diastolic dysfunction. EEG with nonspecific slowing with periodic discharges from left hemisphere most  often seen following strokes.    ID consulted for input and recommended d/c antibiotics and monitoring for recurrent fevers. Patient has had issues with lethargy and keppra d/c 3/27 per neurology input. Patient had recurrent fevers with lethargy 48 hours past stopping empiric antibiotics and she was pan cultured with resumption of Vancomycin and  Ceftriaxone. Urine culture without growth and stools negative for cdiff. Blood cultures X 2 pending. Dr Tommy Medal recommended d/c antibiotics and repeat LP but family reluctant to deescalate care. He recommends two weeks of empiric course of high dose ceftriaxone plus/minus Vancomycin.    Therapy ongoing and patient has difficulty following 1 step commands as well as confusion and inability to answer basic Y/N questions. Has safety awareness, perseverative behaviors as well as impaired mobility. CIR recommended by MD and Rehab team and patient cleared for admission today.  Patient transferred to CIR on 06/27/2014 .    Patient currently requires min with basic self-care skills secondary to decreased cardiorespiratoy endurance, decreased attention to right, decreased attention, decreased awareness, decreased problem solving and decreased memory and decreased standing balance.  Prior to hospitalization, patient was living alone and driving.   Patient will benefit from skilled intervention to increase independence with basic self-care skills prior to discharge home with care partner.  Anticipate patient will require 24 hour supervision and follow up home health.  OT - End of Session Activity Tolerance: Tolerates 10 - 20 min activity with multiple rests Endurance Deficit: Yes Endurance Deficit Description: prior history of breathing difficulty due to smoking (per granddaughter) OT Assessment Rehab Potential (ACUTE ONLY): Fair OT Patient demonstrates impairments in the following area(s): Balance;Behavior;Cognition;Endurance;Safety OT Basic ADL's Functional Problem(s): Eating;Grooming;Bathing;Dressing;Toileting OT Transfers Functional Problem(s): Toilet;Tub/Shower OT Additional Impairment(s): None OT Plan OT Intensity: Minimum of 1-2 x/day, 45 to 90 minutes OT Frequency: 5 out of 7 days OT Duration/Estimated Length of Stay: 8-10 days OT Treatment/Interventions: Balance/vestibular training;Cognitive  remediation/compensation;Discharge planning;Functional mobility training;Patient/family education;Self Care/advanced ADL retraining;Therapeutic Activities;Visual/perceptual remediation/compensation OT Self Feeding Anticipated Outcome(s): supervision with min cues OT Basic Self-Care Anticipated Outcome(s): supervision with min cues OT Toileting Anticipated Outcome(s): supervision with min cues OT Bathroom Transfers Anticipated Outcome(s): supervision with min cues OT Recommendation Patient destination: Home Follow Up Recommendations: Home health OT;24 hour supervision/assistance Equipment Recommended: Tub/shower seat   Skilled Therapeutic Intervention Pt seen for initial evaluation and ADL retraining with a main focus on atttention to task. Pt's granddaughter present and assisted in answering questions during the eval. Pt overall requires min A and max cues with her self care due to severe verbal and motor perseveration and poor attention to task.  Once pt was in engaged in a familiar task such as oral care, she demonstrated good initiation and no apraxia.  Pt appears to have some visual/ R side attention impairments but difficult to accurately assess at this time.  Pt completed self care including toileting. Quick release belt applied and pt taken to nursing station for observation.  OT Evaluation Precautions/Restrictions  Precautions Precautions: Fall Precaution Comments: Impulsive Restrictions Weight Bearing Restrictions: No    Vital Signs Therapy Vitals Pulse Rate: 60 BP: (!) 139/53 mmHg Patient Position (if appropriate): Sitting (after ambulation) Oxygen Therapy SpO2: 100 % O2 Device: Not Delivered Pain Pain Assessment Pain Assessment: No/denies pain Home Living/Prior Functioning Home Living Family/patient expects to be discharged to:: Private residence Living Arrangements: Alone, Children (granddaughter) Available Help at Discharge: Family, Other (Comment) (per chart, can  arrange 24/7 care) Type of Home: Apartment Home Access: Stairs to enter Entrance  Stairs-Number of Steps: 3 Home Layout: One level Additional Comments: history per chart, will need to verfiy when family present   Lives With: Alone Prior Function Level of Independence: Independent with gait, Independent with transfers, Independent with homemaking with ambulation, Independent with basic ADLs Driving: Yes ADL ADL Grooming: Moderate cueing Where Assessed-Grooming: Standing at sink Upper Body Bathing: Maximal cueing Where Assessed-Upper Body Bathing: Standing at sink Lower Body Bathing: Maximal cueing Where Assessed-Lower Body Bathing: Standing at sink Upper Body Dressing: Not assessed (gown for PICC line) Where Assessed-Upper Body Dressing: Wheelchair Lower Body Dressing: Moderate cueing Where Assessed-Lower Body Dressing: Standing at sink Toileting: Moderate cueing Where Assessed-Toileting: Editor, commissioning Method: Ambulating Vision/Perception  Vision- History Baseline Vision/History: Wears glasses Wears Glasses: Reading only Patient Visual Report:  (unable to report) Vision- Assessment Vision Assessment?: Vision impaired- to be further tested in functional context Additional Comments: unable to complete due to cognition, she does have delayed tracking and unable to follow directions for saccades  Cognition Overall Cognitive Status: Impaired/Different from baseline Orientation Level: Oriented to person;Disoriented to place;Disoriented to time;Disoriented to situation Attention: Sustained Sustained Attention: Impaired Sustained Attention Impairment: Verbal basic;Functional basic Memory: Impaired Memory Impairment: Decreased short term memory;Decreased recall of new information Decreased Short Term Memory: Verbal basic Awareness: Appears intact Problem Solving: Appears intact Problem Solving Impairment: Verbal basic;Functional  basic Behaviors: Impulsive;Restless Safety/Judgment: Impaired Sensation Sensation Light Touch: Appears Intact Stereognosis: Appears Intact Hot/Cold: Appears Intact Proprioception: Appears Intact Additional Comments: Unable to formally assess due to cognition but no noted functional impairments Coordination Gross Motor Movements are Fluid and Coordinated: Yes Fine Motor Movements are Fluid and Coordinated: Yes Heel Shin Test: [facility] with visual demonstration Motor  Motor Motor: Motor perseverations;Abnormal postural alignment and control Mobility  Bed Mobility Bed Mobility: Sit to Supine;Supine to Sit Supine to Sit: 5: Supervision;HOB flat Supine to Sit Details: Verbal cues for technique Sit to Supine: 5: Supervision;HOB flat Sit to Supine - Details: Verbal cues for technique Transfers Sit to Stand: 5: Supervision;With upper extremity assist;From chair/3-in-1;From bed;Five times sit to stand Sit to Stand Details: Verbal cues for technique Sit to Stand Details (indicate cue type and reason): 5TSS with UE support = 27 sec Stand to Sit: 5: Supervision;With upper extremity assist;To chair/3-in-1 Stand to Sit Details (indicate cue type and reason): Verbal cues for technique  Trunk/Postural Assessment  Cervical Assessment Cervical Assessment: Exceptions to [facility] (forward head) Thoracic Assessment Thoracic Assessment: Exceptions to [facility] (kyphotic) Lumbar Assessment Lumbar Assessment: Within Functional Limits Postural Control Postural Control: Deficits on evaluation Protective Responses: delayed/impaired  Balance Balance Balance Assessed: Yes Standardized Balance Assessment Standardized Balance Assessment: Timed Up and Go Test Timed Up and Go Test TUG: Normal TUG Normal TUG (seconds): 29 (min guard) Static Standing Balance Static Standing - Balance Support: During functional activity;No upper extremity supported Static Standing - Level of Assistance: 5: Stand by assistance;4: Min  assist Dynamic Standing Balance Dynamic Standing - Balance Support: During functional activity;No upper extremity supported Dynamic Standing - Level of Assistance: 4: Min assist Extremity/Trunk Assessment RUE Assessment RUE Assessment: Within Functional Limits LUE Assessment LUE Assessment: Within Functional Limits  FIM:  FIM - Eating Eating Activity: 5: Supervision/cues FIM - Grooming Grooming Steps: Wash, rinse, dry face;Brush, comb hair;Oral care, brush teeth, clean dentures;Wash, rinse, dry hands Grooming: 5: Supervision: safety issues or verbal cues (mod cues) FIM - Bathing Bathing Steps Patient Completed: Chest;Right Arm;Left Arm;Abdomen;Front perineal area;Buttocks;Right upper leg;Left upper leg Bathing: 4: Min-Patient completes 8-9 [id number]  parts or 75+ percent FIM - Upper Body Dressing/Undressing Upper body dressing/undressing: 0: Wears gown/pajamas-no public clothing (due to PICC line) FIM - Lower Body Dressing/Undressing Lower body dressing/undressing steps patient completed: Thread/unthread right underwear leg;Thread/unthread left underwear leg;Pull underwear up/down;Thread/unthread right pants leg;Thread/unthread left pants leg;Pull pants up/down;Don/Doff right sock;Don/Doff left sock Lower body dressing/undressing: 4: Min-Patient completed 75 plus % of tasks FIM - Toileting Toileting steps completed by patient: Adjust clothing prior to toileting;Performs perineal hygiene;Adjust clothing after toileting Toileting:  (mod cues) FIM - Bed/Chair Transfer Bed/Chair Transfer Assistive Devices: Arm rests Bed/Chair Transfer: 5: Supine > Sit: Supervision (verbal cues/safety issues);5: Sit > Supine: Supervision (verbal cues/safety issues);4: Bed > Chair or W/C: Min A (steadying Pt. > 75%);4: Chair or W/C > Bed: Min A (steadying Pt. > 75%) FIM - Toilet Transfers Toilet Transfers: 4-To toilet/BSC: Min A (steadying Pt. > 75%);4-From toilet/BSC: Min A (steadying Pt. > 75%) FIM -  Tub/Shower Transfers Tub/shower Transfers: 0-Activity did not occur or was simulated   Refer to Care Plan for Long Term Goals  Recommendations for other services: Neuropsych  Discharge Criteria: Patient will be discharged from OT if patient refuses treatment 3 consecutive times without medical reason, if treatment goals not met, if there is a change in medical status, if patient makes no progress towards goals or if patient is discharged from hospital.  The above assessment, treatment plan, treatment alternatives and goals were discussed and mutually agreed upon: by family  [place] 06/28/2014, 12:18 PM

## 2014-06-28 NOTE — Evaluation (Signed)
Physical Therapy Assessment and Plan  Patient Details  Name: Michelle Farrell MRN: [id number] Date of Birth: [birth year]  PT Diagnosis: Impaired cognition and Muscle weakness, Decreased activity tolerance, Decreased standing balance Rehab Potential: Good ELOS: 7-10 days   Today's Date: 06/28/2014 PT Individual Time: 9767-3419   60 min      Problem List:  Patient Active Problem List   Diagnosis Date Noted  . 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 Altered mental status   . Blood poisoning   . Fall   . Sepsis secondary to UTI   . Meningitis   . CVA (cerebral infarction)   . Sepsis 06/19/2014  . Acute encephalopathy 06/19/2014  . UTI (lower urinary tract infection) 06/19/2014  . Tobacco abuse 06/19/2014  . Acute gastroenteritis 06/12/2014  . Crohn's disease 06/12/2014  . DM (diabetes mellitus) 06/12/2014  . Metabolic acidosis 37/90/2409  . Hypercalcemia 06/12/2014  . Oral candidiasis 06/12/2014  . Renal failure 05/16/2014  . AKI (acute kidney injury) 05/16/2014  . Dehydration 05/16/2014  . Pelvic mass 05/16/2014  . Hypertension 05/16/2014  . Essential hypertension 05/16/2014  . Diastolic CHF 73/53/2992  . Back pain, chronic 05/16/2014  . Accelerated hypertension 08/28/2013  . DKA (diabetic ketoacidoses) 08/27/2013  . OBSTRUCTIVE SLEEP APNEA 11/14/2008  . DYSPNEA 11/14/2008  . Diabetes mellitus 06/28/2007  . HYPERLIPIDEMIA 06/28/2007  . NEUROPATHY 06/28/2007  . CHF 06/28/2007  . ESOPHAGEAL STRICTURE 06/28/2007  . HIATAL HERNIA 06/28/2007  . Crohn disease 06/28/2007  . POLYARTHRITIS 06/28/2007  . ELEVATED BLOOD PRESSURE 06/28/2007  . ALLERGY 06/28/2007  . PUD, HX OF 06/28/2007    Past Medical History:  Past Medical History  Diagnosis Date  . CHF (congestive heart failure)   . Hypertension   . Back pain, chronic   . Hip pain, chronic   . GERD (gastroesophageal reflux disease)   . Crohn disease   . Diabetes mellitus without complication   . Esophageal  stricture     s/p dilation 2003, 2008  . PUD (peptic ulcer disease)   . CKD (chronic kidney disease)   . COPD (chronic obstructive pulmonary disease)    Past Surgical History:  Past Surgical History  Procedure Laterality Date  . Abdominal hysterectomy    . Tonsillectomy    . Cholecystectomy    . Bladder surgery      Assessment & Plan Michelle Farrell is a 74 y.o. female with DM, chronic pain, hypertension, Crohn's disease, recent discharged on 06/16/14 past admission for gastroenteritis with dehydration. She was readmitted on 06/19/14 past found unresponsive on the floor by her granddaughter. She was febrile with elevated lactate and UA with 3-6 WBCs and + nitrites. Family occasional twitching and patient with neck stiffness and Dr.Kirpatick recommended Keppra as well as LP due to concerns of meningitis or encephalitis. She was started on Vanc, ceftriaxone, ampicillin and acyclovir. Urine culture grew 80,000 colonies E coli but AMS persisted.  LP done then by IR on day 2 of hospitalization and noted 28 WBCs with 79% neutrophils, mildly elevated glucose and mildly elevated protein to 85 with some RBCs in sample. CSF and blood cultures have remained negative. HSV negative. 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 Carotid dopplers without significant ICA stenosis. 2D echo with EF 60-65% and grade 1 diastolic dysfunction. EEG with nonspecific slowing with periodic discharges from left hemisphere most often seen following strokes.   ID consulted for input and recommended d/c antibiotics and monitoring for recurrent fevers. Patient has  had issues with lethargy and keppra d/c 3/27 per neurology input. Patient had recurrent fevers with lethargy 48 hours past stopping empiric antibiotics and she was pan cultured with resumption of Vancomycin and Ceftriaxone. Urine culture without growth and stools negative for cdiff. Blood cultures X 2  pending. Dr Tommy Medal recommended d/c antibiotics and repeat LP but family reluctant to deescalate care. He recommends two weeks of empiric course of high dose ceftriaxone plus/minus Vancomycin. Therapy ongoing and patient has difficulty following 1 step commands as well as confusion and inability to answer basic Y/N questions. Has safety awareness, perseverative behaviors as well as impaired mobility. Patient transferred to CIR on 06/27/2014.   Patient currently requires min with mobility secondary to muscle weakness, decreased cardiorespiratoy endurance and decreased initiation, decreased attention, decreased awareness, decreased problem solving, decreased safety awareness, decreased memory and delayed processing.  Prior to hospitalization, patient was independent  with mobility and lived with Alone in a [place] home.  Home access is 3Stairs to enter.  Patient will benefit from skilled PT intervention to maximize safe functional mobility, minimize fall risk and decrease caregiver burden for planned discharge home with 24 hour supervision.  Anticipate patient will benefit from follow up [place] at discharge.  PT - End of Session Activity Tolerance: Decreased this session;Tolerates 30+ min activity with multiple rests Endurance Deficit: Yes Endurance Deficit Description: required supine rest break after functional mobility PT Assessment Rehab Potential (ACUTE/IP ONLY): Good PT Patient demonstrates impairments in the following area(s): Balance;Behavior;Endurance;Motor;Perception;Safety PT Transfers Functional Problem(s): Bed Mobility;Bed to Chair;Car;Furniture;Floor PT Locomotion Functional Problem(s): Ambulation;Wheelchair Mobility;Stairs PT Plan PT Intensity: Minimum of 1-2 x/day ,45 to 90 minutes PT Frequency: 5 out of 7 days PT Duration Estimated Length of Stay: 7-10 days PT Treatment/Interventions: Ambulation/gait training;Balance/vestibular training;Cognitive remediation/compensation;Discharge  planning;Disease management/prevention;DME/adaptive equipment instruction;Functional mobility training;Neuromuscular re-education;Patient/family education;Psychosocial support;Stair training;Therapeutic Activities;Therapeutic Exercise;UE/LE Strength taining/ROM;UE/LE Coordination activities;Wheelchair propulsion/positioning PT Transfers Anticipated Outcome(s): supervision due to cognitive impairments PT Locomotion Anticipated Outcome(s): supervision ambulatory due to cognitive impairments PT Recommendation Recommendations for Other Services: Neuropsych consult Follow Up Recommendations: Home health PT;24 hour supervision/assistance Patient destination: Home Equipment Recommended: None recommended by PT  Skilled Therapeutic Intervention Skilled therapeutic intervention initiated after completion of evaluation. No family present to obtain patient's history or discharge plan. Patient required min guard overall for functional mobility without assistive device but mostly limited by verbal and motor perseveration and cognitive impairments. Patient left sitting in wheelchair with quick release belt on at RN station.   PT Evaluation Precautions/Restrictions Precautions Precautions: Fall Precaution Comments: Impulsive Restrictions Weight Bearing Restrictions: No General Chart Reviewed: Yes Family/Caregiver Present: No Vital SignsTherapy Vitals Temp: 99.4 F (37.4 C) Temp Source: Oral Pulse Rate: 60 Resp: 18 BP: (!) 139/53 mmHg Patient Position (if appropriate): Sitting (after ambulation) Oxygen Therapy SpO2: 100 % O2 Device: Not Delivered Pain Pain Assessment Pain Assessment: No/denies pain Home Living/Prior Functioning Home Living Available Help at Discharge: Family;Other (Comment) (per chart, can arrange 24/7 care) Type of Home: Apartment Home Access: Stairs to enter Entrance Stairs-Number of Steps: 3 Home Layout: One level Additional Comments: history per chart, will need to  verfiy when family present   Lives With: Alone Prior Function Level of Independence: Independent with gait;Independent with transfers;Independent with homemaking with ambulation;Independent with basic ADLs Vision/Perception  Vision - Assessment Additional Comments: unable to complete due to cognition, she does have delayed tracking and unable to follow directions for saccades  Cognition Overall Cognitive Status: Impaired/Different from baseline Orientation Level: Oriented to person;Disoriented to place;Disoriented to  time;Disoriented to situation Attention: Sustained Sustained Attention: Impaired Sustained Attention Impairment: Verbal basic;Functional basic Memory: Impaired Memory Impairment: Decreased short term memory;Decreased recall of new information Decreased Short Term Memory: Verbal basic Awareness: Appears intact Problem Solving: Appears intact Problem Solving Impairment: Verbal basic;Functional basic Behaviors: Impulsive;Restless Safety/Judgment: Impaired Sensation Sensation Light Touch: Appears Intact Stereognosis: Not tested Hot/Cold: Appears Intact Proprioception: Appears Intact Additional Comments: Unable to formally assess due to cognition but no noted functional impairments Coordination Gross Motor Movements are Fluid and Coordinated: Yes Fine Motor Movements are Fluid and Coordinated: Yes Heel Shin Test: [facility] with visual demonstration Motor  Motor Motor: Motor perseverations;Abnormal postural alignment and control  Mobility Bed Mobility Bed Mobility: Sit to Supine;Supine to Sit Supine to Sit: 5: Supervision;HOB flat Supine to Sit Details: Verbal cues for technique Sit to Supine: 5: Supervision;HOB flat Sit to Supine - Details: Verbal cues for technique Transfers Transfers: Yes Sit to Stand: 5: Supervision;With upper extremity assist;From chair/3-in-1;From bed;Five times sit to stand Sit to Stand Details: Verbal cues for technique Sit to Stand Details  (indicate cue type and reason): 5TSS with UE support = 27 sec Stand to Sit: 5: Supervision;With upper extremity assist;To chair/3-in-1 Stand to Sit Details (indicate cue type and reason): Verbal cues for technique Stand Pivot Transfers: 4: Min guard Locomotion  Ambulation Ambulation: Yes Ambulation/Gait Assistance: 4: Min guard Ambulation Distance (Feet): 100 Feet Assistive device: None Gait Gait: Yes Gait Pattern: Impaired Gait Pattern: Decreased stride length;Step-through pattern (decreased arm swing L > R) Gait velocity: 10 MWT with min guard = 0.67 m/s Stairs / Additional Locomotion Stairs: Yes Stairs Assistance: 4: Min guard Stair Management Technique: Two rails;Alternating pattern;Step to pattern;Forwards (alternating ascending, step-to descending) Number of Stairs: 12 Height of Stairs: 6 Ramp: Not tested (comment) Curb: Not tested (comment) Wheelchair Mobility Wheelchair Mobility: Yes Wheelchair Assistance: 1: +1 Total assist Wheelchair Parts Management: Needs assistance Distance: 0 ft, patient unable to follow commands to sequence wheelchair mobility using either BUE or BLE  Trunk/Postural Assessment  Cervical Assessment Cervical Assessment: Exceptions to [facility] (forward head) Thoracic Assessment Thoracic Assessment: Exceptions to [facility] (kyphotic) Lumbar Assessment Lumbar Assessment: Within Functional Limits Postural Control Postural Control: Deficits on evaluation Protective Responses: delayed/impaired  Balance Balance Balance Assessed: Yes Standardized Balance Assessment Standardized Balance Assessment: Timed Up and Go Test Timed Up and Go Test TUG: Normal TUG Normal TUG (seconds): 29 (min guard) Static Standing Balance Static Standing - Balance Support: During functional activity;No upper extremity supported Static Standing - Level of Assistance: 5: Stand by assistance;4: Min assist Dynamic Standing Balance Dynamic Standing - Balance Support: During functional  activity;No upper extremity supported Dynamic Standing - Level of Assistance: 4: Min assist Extremity Assessment  RUE Assessment RUE Assessment: Within Functional Limits LUE Assessment LUE Assessment: Within Functional Limits RLE Assessment RLE Assessment: Within Functional Limits (unable to formally assess MMT due to cognitiion but functionally WFL) LLE Assessment LLE Assessment: Within Functional Limits (unable to formally assess MMT due to cognitiion but functionally [facility])  FIM:  FIM - Control and instrumentation engineer Devices: Arm rests Bed/Chair Transfer: 5: Supine > Sit: Supervision (verbal cues/safety issues);5: Sit > Supine: Supervision (verbal cues/safety issues);4: Bed > Chair or W/C: Min A (steadying Pt. > 75%);4: Chair or W/C > Bed: Min A (steadying Pt. > 75%) FIM - Locomotion: Wheelchair Locomotion: Wheelchair: 1: Total Assistance/staff pushes wheelchair (Pt<25%) FIM - Locomotion: Ambulation Locomotion: Ambulation Assistive Devices: Other (comment) (none) Ambulation/Gait Assistance: 4: Min guard Locomotion: Ambulation: 2: Travels 50 - 149 ft  with minimal assistance (Pt.>75%) FIM - Locomotion: Stairs Locomotion: Stairs Assistive Devices: Hand rail - 2 Locomotion: Stairs: 4: Up and Down 12 - 14 stairs with minimal assistance (Pt.>75%)   Refer to Care Plan for Long Term Goals  Recommendations for other services: Neuropsych  Discharge Criteria: Patient will be discharged from PT if patient refuses treatment 3 consecutive times without medical reason, if treatment goals not met, if there is a change in medical status, if patient makes no progress towards goals or if patient is discharged from hospital.  The above assessment, treatment plan, treatment alternatives and goals were discussed and mutually agreed upon: No family available/patient unable  Laretta Alstrom 06/28/2014, 8:57 AM

## 2014-06-28 NOTE — Progress Notes (Signed)
ANTIBIOTIC CONSULT NOTE   Pharmacy Consult for Vancomycin / Rocephin Indication: UTI and probable meningitis  No Known Allergies  Labs:  Recent Labs  06/26/14 0454 06/27/14 0605 06/27/14 1015 06/28/14 0430  WBC 6.2 5.6  --  5.7  HGB 8.3* 8.1*  --  8.6*  PLT 155 160  --  176  CREATININE 0.87 0.80 0.92 0.85   Estimated Creatinine Clearance: 61.4 mL/min (by C-G formula based on Cr of 0.85).  Recent Labs  06/27/14 1520 06/28/14 0430  VANCOTROUGH 17.6 20.1*     Microbiology: Recent Results (from the past 720 hour(s))  Blood Culture (routine x 2)     Status: None   Collection Time: 06/19/14 12:23 PM  Result Value Ref Range Status   Specimen Description BLOOD LEFT ANTECUBITAL  Final   Special Requests BOTTLES DRAWN AEROBIC AND ANAEROBIC 3CC  Final   Culture   Final    NO GROWTH 5 DAYS Performed at [facility]    Report Status 06/25/2014 FINAL  Final  Blood Culture (routine x 2)     Status: None   Collection Time: 06/19/14 12:36 PM  Result Value Ref Range Status   Specimen Description BLOOD LEFT ANTECUBITAL  Final   Special Requests BOTTLES DRAWN AEROBIC AND ANAEROBIC 5ML  Final   Culture   Final    NO GROWTH 5 DAYS Performed at [facility]    Report Status 06/25/2014 FINAL  Final  Urine culture     Status: None   Collection Time: 06/19/14  2:32 PM  Result Value Ref Range Status   Specimen Description URINE, CLEAN CATCH  Final   Special Requests NONE  Final   Colony Count   Final    80,000 COLONIES/ML Performed at [facility]    Culture   Final    ESCHERICHIA COLI Performed at [facility]    Report Status 06/21/2014 FINAL  Final   Organism ID, Bacteria ESCHERICHIA COLI  Final      Susceptibility   Escherichia coli - MIC*    AMPICILLIN >=32 RESISTANT Resistant     CEFAZOLIN >=64 RESISTANT Resistant     CEFTRIAXONE <=1 SENSITIVE Sensitive     CIPROFLOXACIN >=4 RESISTANT Resistant     GENTAMICIN <=1 SENSITIVE Sensitive      LEVOFLOXACIN >=8 RESISTANT Resistant     NITROFURANTOIN <=16 SENSITIVE Sensitive     TOBRAMYCIN <=1 SENSITIVE Sensitive     TRIMETH/SULFA >=320 RESISTANT Resistant     PIP/TAZO >=128 RESISTANT Resistant     * ESCHERICHIA COLI  MRSA PCR Screening     Status: None   Collection Time: 06/19/14  8:30 PM  Result Value Ref Range Status   MRSA by PCR NEGATIVE NEGATIVE Final    Comment:        The GeneXpert MRSA Assay (FDA approved for NASAL specimens only), is one component of a comprehensive MRSA colonization surveillance program. It is not intended to diagnose MRSA infection nor to guide or monitor treatment for MRSA infections.   CSF culture     Status: None   Collection Time: 06/20/14 11:33 AM  Result Value Ref Range Status   Specimen Description CSF  Final   Special Requests NONE  Final   Gram Stain   Final    FEW WBC PRESENT, PREDOMINANTLY PMN NO ORGANISMS SEEN Gram Stain Report Called to,Read Back By and Verified With: Gram Stain Report Called to,Read Back By and Verified With: ALLISON DUNDON [id number]@1 :00AM BJENN Performed at  [facility]    Culture   Final    NO GROWTH 3 DAYS Performed at [facility]    Report Status 06/24/2014 FINAL  Final  Gram stain     Status: None   Collection Time: 06/20/14 11:33 AM  Result Value Ref Range Status   Specimen Description CSF  Final   Special Requests NONE  Final   Gram Stain   Final    CYTOSPIN PREP WBC PRESENT, PREDOMINANTLY PMN NO ORGANISMS SEEN    Report Status 06/20/2014 FINAL  Final  Culture, blood (routine x 2)     Status: None (Preliminary result)   Collection Time: 06/24/14  6:44 PM  Result Value Ref Range Status   Specimen Description BLOOD LEFT HAND  Final   Special Requests BOTTLES DRAWN AEROBIC ONLY 3CC  Final   Culture   Final           BLOOD CULTURE RECEIVED NO GROWTH TO DATE CULTURE WILL BE HELD FOR 5 DAYS BEFORE ISSUING A FINAL NEGATIVE REPORT Performed at [facility]    Report  Status PENDING  Incomplete  Culture, blood (routine x 2)     Status: None (Preliminary result)   Collection Time: 06/24/14  6:53 PM  Result Value Ref Range Status   Specimen Description BLOOD LEFT HAND  Final   Special Requests BOTTLES DRAWN AEROBIC AND ANAEROBIC 5CC  Final   Culture   Final           BLOOD CULTURE RECEIVED NO GROWTH TO DATE CULTURE WILL BE HELD FOR 5 DAYS BEFORE ISSUING A FINAL NEGATIVE REPORT Performed at [facility]    Report Status PENDING  Incomplete  Culture, Urine     Status: None   Collection Time: 06/24/14  7:50 PM  Result Value Ref Range Status   Specimen Description URINE, RANDOM  Final   Special Requests Normal  Final   Colony Count NO GROWTH Performed at [facility]   Final   Culture NO GROWTH Performed at [facility]   Final   Report Status 06/26/2014 FINAL  Final  Clostridium Difficile by PCR     Status: None   Collection Time: 06/26/14  8:19 PM  Result Value Ref Range Status   C difficile by pcr NEGATIVE NEGATIVE Final    Assessment: 74 y.o. female on Vancomycin and Ceftriaxone Day#5 for Ecoli UTI and probable meningitis. Afeb. WBC wnl.  Vancomyin trough 4/1 was 17.6 (therapeutic for goal 15-20) on 737m IV q12h - drawn appropriately.  Another trough drawn 4/2 20.1 (but inaccurate due to dose being hung late with line issues - didn't really need this trough as the 4/1 was drawn before pt transferred to rehab).  Goal of Therapy:  Vancomycin trough level 15-20 mcg/ml  Plan:  Continue Vancomycin 7586mIV q12h Continue Rocephin 2gm IV q12h Will f/u renal function, pt's clinical condition, Vanc trough weekly  CaSherlon HandingPharmD, BCPS Clinical pharmacist, pager [phone]/04/2014,1:19 PM

## 2014-06-28 NOTE — Evaluation (Signed)
Speech Language Pathology Assessment and Plan  Patient Details  Name: Michelle Farrell MRN: [id number] Date of Birth: 06/23/1940  SLP Diagnosis: Speech and Language deficits;Cognitive Impairments;Dysphagia  Rehab Potential: Good ELOS: 7 days     Today's Date: 06/28/2014 SLP Individual Time: 1300-1400 SLP Individual Time Calculation (min): 60 min   Problem List:  Patient Active Problem List   Diagnosis Date Noted  . 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 Altered mental status   . Blood poisoning   . Fall   . Sepsis secondary to UTI   . Meningitis   . CVA (cerebral infarction)   . Sepsis 06/19/2014  . Acute encephalopathy 06/19/2014  . UTI (lower urinary tract infection) 06/19/2014  . Tobacco abuse 06/19/2014  . Acute gastroenteritis 06/12/2014  . Crohn's disease 06/12/2014  . DM (diabetes mellitus) 06/12/2014  . Metabolic acidosis 06/12/2014  . Hypercalcemia 06/12/2014  . Oral candidiasis 06/12/2014  . Renal failure 05/16/2014  . AKI (acute kidney injury) 05/16/2014  . Dehydration 05/16/2014  . Pelvic mass 05/16/2014  . Hypertension 05/16/2014  . Essential hypertension 05/16/2014  . Diastolic CHF 05/16/2014  . Back pain, chronic 05/16/2014  . Accelerated hypertension 08/28/2013  . DKA (diabetic ketoacidoses) 08/27/2013  . OBSTRUCTIVE SLEEP APNEA 11/14/2008  . DYSPNEA 11/14/2008  . Diabetes mellitus 06/28/2007  . HYPERLIPIDEMIA 06/28/2007  . NEUROPATHY 06/28/2007  . CHF 06/28/2007  . ESOPHAGEAL STRICTURE 06/28/2007  . HIATAL HERNIA 06/28/2007  . Crohn disease 06/28/2007  . POLYARTHRITIS 06/28/2007  . ELEVATED BLOOD PRESSURE 06/28/2007  . ALLERGY 06/28/2007  . PUD, HX OF 06/28/2007   Past Medical History:  Past Medical History  Diagnosis Date  . CHF (congestive heart failure)   . Hypertension   . Back pain, chronic   . Hip pain, chronic   . GERD (gastroesophageal reflux disease)   . Crohn disease   . Diabetes mellitus without complication   .  Esophageal stricture     s/p dilation 2003, 2008  . PUD (peptic ulcer disease)   . CKD (chronic kidney disease)   . COPD (chronic obstructive pulmonary disease)    Past Surgical History:  Past Surgical History  Procedure Laterality Date  . Abdominal hysterectomy    . Tonsillectomy    . Cholecystectomy    . Bladder surgery      Assessment / Plan / Recommendation Clinical Impression   Michelle Farrell is a 74 y.o. female with DM, chronic pain, hypertension, Crohn's disease, recently discharged on 06/16/14 past admission for gastroenteritis with dehydration. She was readmitted on 06/19/14 past found unresponsive on the floor by her granddaughter. Family noted occasional twitching and patient with neck stiffness and Dr.Kirpatick recommended Keppra as well as LP due to concerns of meningitis or encephalitis. 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 Therapy ongoing and patient has difficulty following 1 step commands as well as confusion and inability to answer basic Y/N questions. Has safety awareness, perseverative behaviors as well as impaired mobility. Pt admitted to CIR on 06/27/2014.  SLP evaluation completed on 06/28/2014 with the following results: Pt presents with s/s of a mild-moderate dysphagia characterized by prolonged but efficient mastication of dys 2 textures due to missing dentition and tardive dyskinesia impacting rotary chew.  Pt effectively cleared boluses from the oral cavity with no significant residuals noted post swallow.  No overt s/s of aspiration were noted with straw sips of thin liquids.  Recommend that pt continue on dys 2 textures and thin liquids with   full supervision for use of swallowing precautions.   Additionally, pt presents with severe cognitive deficits characterized by decreased sustained attention, orientation, and initiation which impact all higher level cognitive processes such as memory, problem  solving, and awareness of deficits.  Pt's verbal output was perseverative and echolalic in nature and she oftentimes demonstrated a significant delay in her responses to questions.  Pt was able to follow 1-step commands in a very basic and familiar context.  Overall, pt presents with very passive participation in therapy consistent with dementia and question baseline cognition.  Given report that pt was living independently and driving prior to admission, pt would benefit from skilled ST while inpatient in order to maximize functional independence and reduce burden of care prior to discharge.  Anticipate that pt will need 24/7 supervision, assistance for medication and financial management, and ST follow up in either the home health or outpatient setting at discharge.    Skilled Therapeutic Interventions          Cognitive-linguistic and bedside swallowing evaluation completed see above. Pt required overall mod-max assist verbal, visual, and tactile cues to follow commands during a functional sorting task of silverware in the ADL kitchen.  Pt also benefited from min-mod question cues for initiation to request a seated rest break after becoming fatigued from standing.      SLP Assessment  Patient will need skilled Speech Lanaguage Pathology Services during CIR admission    Recommendations  Diet Recommendations: Dysphagia 2 (Fine chop);Thin liquid Liquid Administration via: Cup;Straw Medication Administration: Crushed with puree Supervision: Full supervision/cueing for compensatory strategies;Staff to assist with self feeding Compensations: Slow rate;Small sips/bites;Check for pocketing Postural Changes and/or Swallow Maneuvers: Seated upright 90 degrees;Upright 30-60 min after meal;Out of bed for meals Oral Care Recommendations: Oral care BID Patient destination: Home Follow up Recommendations: Home Health SLP;24 hour supervision/assistance;Outpatient SLP Equipment Recommended: None recommended by  SLP    SLP Frequency 3 to 5 out of 7 days   SLP Treatment/Interventions Cognitive remediation/compensation;Cueing hierarchy;Functional tasks;Dysphagia/aspiration precaution training;Patient/family education;Internal/external aids;Environmental controls    Pain Pain Assessment Pain Assessment: No/denies pain Prior Functioning Cognitive/Linguistic Baseline: Within functional limits (per report, pt lived independently and drove ) Type of Home: Apartment  Lives With: Alone Available Help at Discharge: Family;Other (Comment) (per report, pt's granddaughter can arrange 24/7) Vocation: Retired  Short Term Goals: Week 1: SLP Short Term Goal 1 (Week 1): STG = LTG due to estimated length of stay   See FIM for current functional status Refer to Care Plan for Long Term Goals  Recommendations for other services: None  Discharge Criteria: Patient will be discharged from SLP if patient refuses treatment 3 consecutive times without medical reason, if treatment goals not met, if there is a change in medical status, if patient makes no progress towards goals or if patient is discharged from hospital.  The above assessment, treatment plan, treatment alternatives and goals were discussed and mutually agreed upon: No family available/patient unable  ,  L 06/28/2014, 4:09 PM   

## 2014-06-29 ENCOUNTER — Inpatient Hospital Stay (HOSPITAL_COMMUNITY): Payer: Medicare Other

## 2014-06-29 LAB — GLUCOSE, CAPILLARY
GLUCOSE-CAPILLARY: 132 mg/dL — AB (ref 70–99)
GLUCOSE-CAPILLARY: 177 mg/dL — AB (ref 70–99)
GLUCOSE-CAPILLARY: 109 mg/dL — AB (ref 70–99)
GLUCOSE-CAPILLARY: 172 mg/dL — AB (ref 70–99)

## 2014-06-29 NOTE — Progress Notes (Signed)
Patient ID: Michelle Farrell, female   DOB: 02-24-1941, 75 y.o.   MRN: [id number]  Patient ID: Michelle Farrell, female   DOB: Dec 30, 1940, 74 y.o.   MRN: [id number]   06/29/14.  HPI: Michelle Farrell is a 74 y.o. female with DM, chronic pain, hypertension, Crohn's disease, recent discharged on 06/16/14 past admission for gastroenteritis with dehydration. She was readmitted on 06/19/14 past found unresponsive.  Dr.Kirpatick recommended Keppra as well as LP due to concerns of meningitis or encephalitis.CSF and blood cultures have remained negative.  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   CIR recommended by MD and Rehab team and patient cleared for admission 06/27/14.  Remains on Rocephin and Vanco  Past Medical History  Diagnosis Date  . CHF (congestive heart failure)   . Hypertension   . Back pain, chronic   . Hip pain, chronic   . GERD (gastroesophageal reflux disease)   . Crohn disease   . Diabetes mellitus without complication   . Esophageal stricture     s/p dilation 2003, 2008  . PUD (peptic ulcer disease)   . CKD (chronic kidney disease)   . COPD (chronic obstructive pulmonary disease)              Physical Exam: Blood pressure 118/53, pulse 55, temperature 98 F (36.7 C), temperature source Oral, resp. rate 18, height 5' 6"  (1.676 m), weight 73.6 kg (162 lb 4.1 oz), SpO2 100 %. Physical Exam  Nursing note and vitals reviewed. Constitutional: She appears well-developed and well-nourished.  HENT: dentition fair Head: Normocephalic and atraumatic.  Eyes: Conjunctivae are normal. Pupils are equal, round, and reactive to light.  Neck: Normal range of motion. Neck supple.  Cardiovascular: Normal rate and regular rhythm.no murmur or gallop  Respiratory: Effort normal and breath sounds normal. No wheezes GI: Soft. Bowel sounds are  normal. She exhibits no distension. There is no tenderness.  Musculoskeletal: She exhibits tenderness. She exhibits no edema.  Neurological: She is alert.  Oriented to self only. Looked straight ahead and needed constant cues to make eye contact. Tardrive dyskinesia--kept smacking her lips. Was able to state her name and age but perseverated on "61" thorough out the rest of exam. Unable to follow simple motor commands without visual and tactile cues. Moves Left side > right. Difficult to engage Skin: Skin is warm and dry.  Psych: quiet, withdrawn  Patient Vitals for the past 24 hrs:  BP Temp Temp src Pulse Resp SpO2  06/29/14 0557 (!) 126/43 mmHg 98.8 F (37.1 C) Oral (!) 57 20 100 %  06/28/14 1540 140/60 mmHg 98.1 F (36.7 C) Oral 65 17 100 %     Medical Problem List and Plan: 1. Functional deficits secondary toleft medial thalamic infarct 2. DVT Prophylaxis/Anticoagulation: Pharmaceutical: Lovenox 3. Pain Management: tylenol prn for pain 4. Mood: LCSW to follow for evaluation and support as mentation improves. 5. Neuropsych: This patient is not capable of making decisions on her own behalf. 6. Skin/Wound Care: Routine pressure relief measures.  7. Fluids/Electrolytes/Nutrition: Monitor I/O.  Sodium has improved from 125-138; potassium increased from 2.9 to 3.7; much improved glycemic control with glucose 547  and 121 yesterday  morning.  8. HTN: monitor BP every 8 hours. Continue lisinopril, aldactone and catapres. BP has been running low normal-will consider down titrating mads 9. Fevers: Continue high dose ceftriaxone for two weeks for presumed meningitis.  -also with recent UTI however last ucx neg, blood culture pending -consider abdominal  CT Monday pending course Presently afebrile 10. ABLA?: Has had drop in H/H from 12--> 8.1. Question Hemodilution. Monitor for signs of bleeding. Repeat H/H today 8.6/24.9 11. Crohn's disease: Off azulfidine at this time. No complaints  of abdominal distress/diarrhea 12. DM type 2: Monitor BS with achs checks. Off Januvia due to poor intake. Will use SSI for now and resume oral meds as indicated 13. CKD: Improved post hydration. Baseline Cr-1.5-1.9 in the past years. Presently <1

## 2014-06-29 NOTE — Plan of Care (Signed)
Problem: RH SAFETY Goal: RH STG ADHERE TO SAFETY PRECAUTIONS W/ASSISTANCE/DEVICE STG Adhere to Safety Precautions With min Assistance/Device.  Outcome: Not Progressing Pt still continuing to get out of bed without assistance. Teaching of use of call bell is unsuccessful so far.

## 2014-06-29 NOTE — Progress Notes (Addendum)
Physical Therapy Session Note  Patient Details  Name: Michelle Farrell MRN: [id number] Date of Birth: 11-05-1940  Today's Date: 06/29/2014 PT Individual Time: 1345-1415 PT Individual Time Calculation (min): 30 min   Short Term Goals: Week 1:  PT Short Term Goal 1 (Week 1): = LTGs due to anticipated LOS  Skilled Therapeutic Interventions/Progress Updates:   Session focused on functional transfers, dynamic standing balance, dynamic gait, stair negotiation, and addressing cognition during these tasks. Pt demonstrating decreased problem solving, delayed processing at times and only able to follow simple 1 step commands. Attempted w/c propulsion again (unable to perform on Eval yesterday) and pt still unable to sequence for follow commands to perform with either BUE and BLE. Dynamic standing balance activity to pitch horseshoes and pick up from floor with min A for balance. Gait with min guard without AD and increased challenge to kicking yoga block while performing gait to challenge balance and perform dual task activity. Pt limited due to SOB though O2 remained 100% on room air. Educated on pursed lip breathing. Stairs performed with bilateral rails for functional strengthening and home entry practice. Safety belt donned and placed at nurses station for safety. Shortly after therapist set her up at nurse's station, pt's daughter arrived and RN assisted with transport back to room.  Therapy Documentation Precautions:  Precautions Precautions: Fall Precaution Comments: Impulsive Restrictions Weight Bearing Restrictions: No   Pain:  No complaints.  See FIM for current functional status  Therapy/Group: Individual Therapy  Canary Brim Ivory Broad, PT, DPT  06/29/2014, 3:36 PM

## 2014-06-30 ENCOUNTER — Inpatient Hospital Stay (HOSPITAL_COMMUNITY): Payer: Medicare Other | Admitting: Physical Therapy

## 2014-06-30 ENCOUNTER — Inpatient Hospital Stay (HOSPITAL_COMMUNITY): Payer: Medicare Other | Admitting: Speech Pathology

## 2014-06-30 ENCOUNTER — Inpatient Hospital Stay (HOSPITAL_COMMUNITY): Payer: Medicare Other | Admitting: Rehabilitation

## 2014-06-30 ENCOUNTER — Inpatient Hospital Stay (HOSPITAL_COMMUNITY): Payer: Medicare Other | Admitting: Occupational Therapy

## 2014-06-30 DIAGNOSIS — G009 Bacterial meningitis, unspecified: Secondary | ICD-10-CM | POA: Diagnosis present

## 2014-06-30 DIAGNOSIS — R410 Disorientation, unspecified: Secondary | ICD-10-CM

## 2014-06-30 DIAGNOSIS — R509 Fever, unspecified: Secondary | ICD-10-CM

## 2014-06-30 LAB — GLUCOSE, CAPILLARY
GLUCOSE-CAPILLARY: 136 mg/dL — AB (ref 70–99)
GLUCOSE-CAPILLARY: 128 mg/dL — AB (ref 70–99)
Glucose-Capillary: 120 mg/dL — ABNORMAL HIGH (ref 70–99)
Glucose-Capillary: 172 mg/dL — ABNORMAL HIGH (ref 70–99)

## 2014-06-30 MED ORDER — GLUCERNA SHAKE PO LIQD
237.0000 mL | Freq: Three times a day (TID) | ORAL | Status: DC
  Administered 2014-06-30 – 2014-07-04 (×8): 237 mL via ORAL

## 2014-06-30 NOTE — Progress Notes (Signed)
Physical Therapy Session Note  Patient Details  Name: Michelle Farrell MRN: [id number] Date of Birth: 1940-10-09  Today's Date: 06/30/2014 PT Individual Time: 1400-1500 PT Individual Time Calculation (min): 60 min   Short Term Goals: Week 1:  PT Short Term Goal 1 (Week 1): = LTGs due to anticipated LOS  Skilled Therapeutic Interventions/Progress Updates:   Pt received sitting in w/c in room, agreeable to therapy session.  Assisted with donning shoes prior to session.  Assisted to/from therapy gym via w/c for time management. Once in therapy gym, skilled session focused on standing balance, tolerance, sustained and alternating attention, memory, mental flexibility, and problem solving.  Addressed these with game of War in standing.  Pt able to follow game, however requires total fading to mod/max questioning cues for sequence of game and rules.  Pt then able to count cards to determine who won round, however requires total A cues to recall how many cards in deck as she states PT had 33 cards and she had 31 cards.  Progressed to matching colored blocks to colored chart.  Requires max to total A cues for first rep of this task, however she was able to complete second rep without any cues with 100% accuracy.  Daughter then in during session to observe.  Pt ambulated x 150' x 2 reps with light HHA (min A).  Explained to daughter that she is doing well from a mobility stand point, however is extremely limited cognitively and provided examples to better explain.  Also educated team conference, ELOS, and expected outcomes of 24/7 S due to cognitive status.  Pts daughter verbalized understanding and still wanting to speak with CSW.  Assisted pt back to room and left in w/c with quick release belt donned.  CSW notified and in room to speak with pts daughter.    Therapy Documentation Precautions:  Precautions Precautions: Fall Precaution Comments: Impulsive Restrictions Weight Bearing Restrictions:  No  Pain: Pain Assessment Pain Assessment: No/denies pain  See FIM for current functional status  Therapy/Group: Individual Therapy  Denice Bors 06/30/2014, 2:20 PM

## 2014-06-30 NOTE — Progress Notes (Signed)
Social Work Assessment and Plan Social Work Assessment and Plan  Patient Details  Name: Michelle Farrell MRN: [id number] Date of Birth: May 15, 1940  Today's Date: 06/30/2014  Problem List:  Patient Active Problem List   Diagnosis Date Noted  . 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 Altered mental status   . Blood poisoning   . Fall   . Sepsis secondary to UTI   . Meningitis   . CVA (cerebral infarction)   . Sepsis 06/19/2014  . Acute encephalopathy 06/19/2014  . UTI (lower urinary tract infection) 06/19/2014  . Tobacco abuse 06/19/2014  . Acute gastroenteritis 06/12/2014  . Crohn's disease 06/12/2014  . DM (diabetes mellitus) 06/12/2014  . Metabolic acidosis 23/53/6144  . Hypercalcemia 06/12/2014  . Oral candidiasis 06/12/2014  . Renal failure 05/16/2014  . AKI (acute kidney injury) 05/16/2014  . Dehydration 05/16/2014  . Pelvic mass 05/16/2014  . Hypertension 05/16/2014  . Essential hypertension 05/16/2014  . Diastolic CHF 31/54/0086  . Back pain, chronic 05/16/2014  . Accelerated hypertension 08/28/2013  . DKA (diabetic ketoacidoses) 08/27/2013  . OBSTRUCTIVE SLEEP APNEA 11/14/2008  . DYSPNEA 11/14/2008  . Diabetes mellitus 06/28/2007  . HYPERLIPIDEMIA 06/28/2007  . NEUROPATHY 06/28/2007  . CHF 06/28/2007  . ESOPHAGEAL STRICTURE 06/28/2007  . HIATAL HERNIA 06/28/2007  . Crohn disease 06/28/2007  . POLYARTHRITIS 06/28/2007  . ELEVATED BLOOD PRESSURE 06/28/2007  . ALLERGY 06/28/2007  . PUD, HX OF 06/28/2007   Past Medical History:  Past Medical History  Diagnosis Date  . CHF (congestive heart failure)   . Hypertension   . Back pain, chronic   . Hip pain, chronic   . GERD (gastroesophageal reflux disease)   . Crohn disease   . Diabetes mellitus without complication   . Esophageal stricture     s/p dilation 2003, 2008  . PUD (peptic ulcer disease)   . CKD (chronic kidney disease)   . COPD (chronic obstructive pulmonary disease)    Past Surgical  History:  Past Surgical History  Procedure Laterality Date  . Abdominal hysterectomy    . Tonsillectomy    . Cholecystectomy    . Bladder surgery     Social History:  reports that she has been smoking.  She has never used smokeless tobacco. She reports that she does not drink alcohol or use illicit drugs.  Family / Support Systems Marital Status: Widow/Widower Patient Roles: Other (Comment) (grandmother) Other Supports: [name] Burkett-granddaughter  [phone]-cell Anticipated Caregiver: [name] and hired assist Ability/Limitations of Caregiver: [name] works from home and has a 44 yo son along with being in school Caregiver Availability: 24/7 Family Dynamics: very close with grandaughter who is very involved and here daily.  She is trying to figure out a 24 hr discharge plan and is enlisting other fmaily, friends and any help from the [place].   Social History Preferred language: English Religion: Methodist Cultural Background: No issues Education: Some college Read: Yes Write: Yes Employment Status: Retired Freight forwarder Issues: No issues Guardian/Conservator: None-according to MD pt is not capable of making her own decisions while here.  [name]-is pt's POA and will look to her to make any decisions while here.   Abuse/Neglect Physical Abuse: Denies Verbal Abuse: Denies Sexual Abuse: Denies Exploitation of patient/patient's resources: Denies Self-Neglect: Denies  Emotional Status Pt's affect, behavior adn adjustment status: Pt is not able to participate in interview due to cognition issues-she at times will say a word but at times doesn't pertain to the conversation.  [name] reports pt has always  been very independent and taken care of herself and been very active.  She would like to get back to this when recovers from this. Recent Psychosocial Issues: other health issues-was being treated for N & V and recently released from the hospital 3/21. Pyschiatric History: No  issues or history-unable to assess depression screen due to cognition issues.  Will re-assess when appropriate in stay. Substance Abuse History: Tobacco was smoking pTA unsure if plans to quit now.  Patient / Family Perceptions, Expectations & Goals Pt/Family understanding of illness & functional limitations: Granddaughter is a NP and has a good understanding of pt's disgnosis and the treatment plan.  She speaks with the MD daily and will call if her questions are not being addressed.  Her main goal is she get the treatment she needs and hopefully will make good progress while here. Premorbid pt/family roles/activities: grandmother, retiree, frined, church member, etc Anticipated changes in roles/activities/participation: resume Pt/family expectations/goals: Pt states: " Glad to be done."  [name] states: " I hope she will continue to get well and clear more cognitively."  [employer]: None Premorbid Home Care/DME Agencies: None Transportation available at discharge: Family Resource referrals recommended: Support group (specify)  Discharge Planning Living Arrangements: Alone Support Systems: Water engineer, Immunologist, Children Type of Residence: Private residence Insurance underwriter Resources: Multimedia programmer (specify) Sports administrator) Museum/gallery curator Resources: Fish farm manager, Other (Comment) (VA pension) Museum/gallery curator Screen Referred: No Living Expenses: Education officer, community Management: Patient Does the patient have any problems obtaining your medications?: No Home Management: Patient did prior to admission Patient/Family Preliminary Plans: Return home with someone with her-grandaughter is aware of the 24 hr supervision recommendation due to her cognition and confusion she has now.  Granddaughter is exploring options she knows some CNA's and is trying to get VA form that would assist with someone being with her. Social Work Anticipated Follow Up Needs: HH/OP, Support  Group  Clinical Impression Pleasant confused pt who at times participates in the conversation and other acts like she is not in the room with you.  Her granddaughter is very involved and supportive and is working on  24 hr supervision for her.  She does work out of her home and can provide most of the care but there are times she will need to hire assist.  Will meet with once team conference and  come up with a safe discharge plan.  She will bring in [place] form to be completed.  Elease Hashimoto 06/30/2014, 3:37 PM

## 2014-06-30 NOTE — Progress Notes (Signed)
Physical Therapy Session Note  Patient Details  Name: Michelle Farrell MRN: [id number] Date of Birth: [birth year]  Today's Date: 06/30/2014 PT Individual Time: 0830-0900 PT Individual Time Calculation (min): 30 min   Short Term Goals: Week 1:  PT Short Term Goal 1 (Week 1): = LTGs due to anticipated LOS  Skilled Therapeutic Interventions/Progress Updates:   Pt received sitting in w/c at nursing station, agreeable to therapy session.  Skilled session focused on gait, cognitive remediation, balance and standing tolerance.  Performed gait x 65' x 2 without AD but with light HHA (min A) in controlled environment.  Note that pt with narrow BOS and was somewhat staggered.  Once in ADL apt, questioned pt on orientation questions.  She first stated she was at "[place]" and requires total A cues to end perseveration and orient to [facility].  She then continued to state "[place]" despite total A cues to [employer].  She was able to state that she had had a stroke and that her thinking was off and her walking was not as good.  Ambulated back to therapy gym.  Remainder of session focused on attention, following simple commands, bimanual task, and problem solving with pipe tree activity.  Pt able to state what shape was, however requires total A cues and HOH cues to complete part of task.  Pt then able to take pipe tree apart with two simple commands to continue with task.  Assisted pt back to nursing station with quick release belt donned.  Nurse secretary aware.   Therapy Documentation Precautions:  Precautions Precautions: Fall Precaution Comments: Impulsive Restrictions Weight Bearing Restrictions: No   Vital Signs: Therapy Vitals Temp: 98.2 F (36.8 C) Temp Source: Oral Pulse Rate: (!) 55 Resp: 18 BP: (!) 115/42 mmHg Patient Position (if appropriate): Lying Oxygen Therapy SpO2: 98 % O2 Device: Not Delivered Pain: Pt with no c/o pain this morning.     See FIM for current  functional status  Therapy/Group: Individual Therapy  Denice Bors 06/30/2014, 8:38 AM

## 2014-06-30 NOTE — Care Management Note (Signed)
Inpatient Rehabilitation Center Individual Statement of Services  Patient Name:  Michelle Farrell  Date:  06/30/2014  Welcome to the [facility].  Our goal is to provide you with an individualized program based on your diagnosis and situation, designed to meet your specific needs.  With this comprehensive rehabilitation program, you will be expected to participate in at least 3 hours of rehabilitation therapies Monday-Friday, with modified therapy programming on the weekends.  Your rehabilitation program will include the following services:  Physical Therapy (PT), Occupational Therapy (OT), Speech Therapy (ST), 24 hour per day rehabilitation nursing, Therapeutic Recreaction (TR), Case Management (Social Worker), Rehabilitation Medicine, Nutrition Services and Pharmacy Services  Weekly team conferences will be held on Wednesday to discuss your progress.  Your Social Worker will talk with you frequently to get your input and to update you on team discussions.  Team conferences with you and your family in attendance may also be held.  Expected length of stay: 8-10 days Overall anticipated outcome: supervision with cueing  Depending on your progress and recovery, your program may change. Your Social Worker will coordinate services and will keep you informed of any changes. Your Social Worker's name and contact numbers are listed  below.  The following services may also be recommended but are not provided by the [facility] will be made to provide these services after discharge if needed.  Arrangements include referral to agencies that provide these services.  Your insurance has been verified to be:  [employer]-Medicare Your primary doctor is:  Dr Hermelinda Medicus  Pertinent information will be shared with your doctor and your insurance  company.  Social Worker:  Ovidio Kin, [place] or ([phone]  Information discussed with and copy given to patient by: Elease Hashimoto, 06/30/2014, 11:34 AM

## 2014-06-30 NOTE — Progress Notes (Signed)
Speech Language Pathology Daily Session Note  Patient Details  Name: Michelle Farrell MRN: [id number] Date of Birth: [birth year]  Today's Date: 06/30/2014 SLP Individual Time: 1300-1400 SLP Individual Time Calculation (min): 60 min  Short Term Goals: Week 1: SLP Short Term Goal 1 (Week 1): STG + LTG due to estimated length of stay   Skilled Therapeutic Interventions: ST session focused on orientation, attention, and long term recall. Pt's granddaughter was present during the session, and provided information regarding pt's siblings, grandchildren and great grandchildren. Pt had difficulty answering questions accurately due to decreased attention to task. Frequent redirection and/or repetition of questions was required. Pt unable to state year or month. Written information regarding current month and year posted on door where pt could read it. Significant difficulty recalling names of grandchildren and great grandchildren. SLP created written list for future use. Michelle Farrell was encouraged to bring in pictures of family members to facilitate recall.    FIM:  Comprehension Comprehension Mode: Auditory Comprehension: 2-Understands basic 25 - 49% of the time/requires cueing 51 - 75% of the time Expression Expression Mode: Verbal Expression: 1-Expresses basis less than 25% of the time/requires cueing greater than 75% of the time. Social Interaction Social Interaction: 2-Interacts appropriately 25 - 49% of time - Needs frequent redirection. Problem Solving Problem Solving: 1-Solves basic less than 25% of the time - needs direction nearly all the time or does not effectively solve problems and may need a restraint for safety Memory Memory: 1-Recognizes or recalls less than 25% of the time/requires cueing greater than 75% of the time FIM - Eating Eating Activity: 5: Supervision/cues  Pain Pain Assessment Pain Assessment: No/denies pain  Therapy/Group: Individual Therapy  Charline Hoskinson B. Warner, [facility],  [place]  Shonna Chock 06/30/2014, 3:57 PM

## 2014-06-30 NOTE — Progress Notes (Deleted)
Pt states she will place herself on CPAP when ready. RT will continue to monitor.

## 2014-06-30 NOTE — Progress Notes (Signed)
[facility] for Infectious Disease    Subjective: I feel great   Antibiotics:  Anti-infectives    Start     Dose/Rate Route Frequency Ordered Stop   06/27/14 1700  cefTRIAXone (ROCEPHIN) 2 g in dextrose 5 % 50 mL IVPB - Premix     2 g 100 mL/hr over 30 Minutes Intravenous Every 12 hours 06/27/14 1601     06/27/14 1700  vancomycin (VANCOCIN) IVPB 750 mg/150 ml premix     750 mg 150 mL/hr over 60 Minutes Intravenous Every 12 hours 06/27/14 1601        Medications: Scheduled Meds: . aspirin EC  325 mg Oral Daily  . cefTRIAXone (ROCEPHIN)  IV  2 g Intravenous Q12H  . cloNIDine  0.1 mg Oral BID  . enoxaparin (LOVENOX) injection  40 mg Subcutaneous Q24H  . famotidine  20 mg Oral QHS  . feeding supplement (GLUCERNA SHAKE)  237 mL Oral TID WC  . insulin aspart  0-5 Units Subcutaneous QHS  . insulin aspart  0-9 Units Subcutaneous TID WC  . lisinopril  10 mg Oral Daily  . simvastatin  40 mg Oral q1800  . spironolactone  25 mg Oral Daily  . vancomycin  750 mg Intravenous Q12H   Continuous Infusions:  PRN Meds:.acetaminophen, albuterol, alum & mag hydroxide-simeth, bisacodyl, guaiFENesin-dextromethorphan, naLOXone (NARCAN)  injection, ondansetron **OR** ondansetron (ZOFRAN) IV, prochlorperazine **OR** prochlorperazine **OR** prochlorperazine, RESOURCE THICKENUP CLEAR, senna-docusate, sodium chloride, traZODone    Objective: Weight change:   Intake/Output Summary (Last 24 hours) at 06/30/14 1550 Last data filed at 06/30/14 1353  Gross per 24 hour  Intake    240 ml  Output      0 ml  Net    240 ml   Blood pressure 130/44, pulse 57, temperature 97.6 F (36.4 C), temperature source Oral, resp. rate 18, SpO2 100 %. Temp:  [97.6 F (36.4 C)-98.2 F (36.8 C)] 97.6 F (36.4 C) (04/04 1514) Pulse Rate:  [55-57] 57 (04/04 1514) Resp:  [18] 18 (04/04 1514) BP: (115-130)/(42-56) 130/44 mmHg (04/04 1514) SpO2:  [98 %-100 %] 100 % (04/04 1514)  Physical Exam: General:  Alert and awake, she believes she is at [place]: anicteric sclera,, EOMI CVS regular rate, normal r,  no murmur rubs or gallops Chest: clear to auscultation bilaterally, no wheezing, rales or rhonchi Abdomen: soft nontender, nondistended, normal bowel sounds, Extremities: no ulcers, has onychomycotic nail on the left big toe Skin: no rashes  Neuro: nonfocal but confused  CBC: CBC Latest Ref Rng 06/28/2014 06/27/2014 06/26/2014  WBC 4.0 - 10.5 K/uL 5.7 5.6 6.2  Hemoglobin 12.0 - 15.0 g/dL 8.6(L) 8.1(L) 8.3(L)  Hematocrit 36.0 - 46.0 % 24.9(L) 22.6(L) 23.6(L)  Platelets 150 - 400 K/uL 176 160 155       BMET  Recent Labs  06/28/14 0430  NA 138  K 3.7  CL 113*  CO2 22  GLUCOSE 121*  BUN <5*  CREATININE 0.85  CALCIUM 9.2     Liver Panel   Recent Labs  06/28/14 0430  PROT 5.2*  ALBUMIN 2.8*  AST 21  ALT 28  ALKPHOS 46  BILITOT 0.4       Sedimentation Rate No results for input(s): ESRSEDRATE in the last 72 hours. C-Reactive Protein No results for input(s): CRP in the last 72 hours.  Micro Results: Recent Results (from the past 720 hour(s))  Blood Culture (routine x 2)     Status: None   Collection  Time: 06/19/14 12:23 PM  Result Value Ref Range Status   Specimen Description BLOOD LEFT ANTECUBITAL  Final   Special Requests BOTTLES DRAWN AEROBIC AND ANAEROBIC 3CC  Final   Culture   Final    NO GROWTH 5 DAYS Performed at [facility]    Report Status 06/25/2014 FINAL  Final  Blood Culture (routine x 2)     Status: None   Collection Time: 06/19/14 12:36 PM  Result Value Ref Range Status   Specimen Description BLOOD LEFT ANTECUBITAL  Final   Special Requests BOTTLES DRAWN AEROBIC AND ANAEROBIC 5ML  Final   Culture   Final    NO GROWTH 5 DAYS Performed at [facility]    Report Status 06/25/2014 FINAL  Final  Urine culture     Status: None   Collection Time: 06/19/14  2:32 PM  Result Value Ref Range Status   Specimen  Description URINE, CLEAN CATCH  Final   Special Requests NONE  Final   Colony Count   Final    80,000 COLONIES/ML Performed at [facility]    Culture   Final    ESCHERICHIA COLI Performed at [facility]    Report Status 06/21/2014 FINAL  Final   Organism ID, Bacteria ESCHERICHIA COLI  Final      Susceptibility   Escherichia coli - MIC*    AMPICILLIN >=32 RESISTANT Resistant     CEFAZOLIN >=64 RESISTANT Resistant     CEFTRIAXONE <=1 SENSITIVE Sensitive     CIPROFLOXACIN >=4 RESISTANT Resistant     GENTAMICIN <=1 SENSITIVE Sensitive     LEVOFLOXACIN >=8 RESISTANT Resistant     NITROFURANTOIN <=16 SENSITIVE Sensitive     TOBRAMYCIN <=1 SENSITIVE Sensitive     TRIMETH/SULFA >=320 RESISTANT Resistant     PIP/TAZO >=128 RESISTANT Resistant     * ESCHERICHIA COLI  MRSA PCR Screening     Status: None   Collection Time: 06/19/14  8:30 PM  Result Value Ref Range Status   MRSA by PCR NEGATIVE NEGATIVE Final    Comment:        The GeneXpert MRSA Assay (FDA approved for NASAL specimens only), is one component of a comprehensive MRSA colonization surveillance program. It is not intended to diagnose MRSA infection nor to guide or monitor treatment for MRSA infections.   CSF culture     Status: None   Collection Time: 06/20/14 11:33 AM  Result Value Ref Range Status   Specimen Description CSF  Final   Special Requests NONE  Final   Gram Stain   Final    FEW WBC PRESENT, PREDOMINANTLY PMN NO ORGANISMS SEEN Gram Stain Report Called to,Read Back By and Verified With: Gram Stain Report Called to,Read Back By and Verified With: ALLISON DUNDON [id number]@1 :00AM BJENN Performed at [facility]   Final    NO GROWTH 3 DAYS Performed at [facility]    Report Status 06/24/2014 FINAL  Final  Gram stain     Status: None   Collection Time: 06/20/14 11:33 AM  Result Value Ref Range Status   Specimen Description CSF  Final   Special Requests NONE   Final   Gram Stain   Final    CYTOSPIN PREP WBC PRESENT, PREDOMINANTLY PMN NO ORGANISMS SEEN    Report Status 06/20/2014 FINAL  Final  Culture, blood (routine x 2)     Status: None (Preliminary result)   Collection Time: 06/24/14  6:44 PM  Result Value Ref Range Status   Specimen Description BLOOD LEFT HAND  Final   Special Requests BOTTLES DRAWN AEROBIC ONLY 3CC  Final   Culture   Final           BLOOD CULTURE RECEIVED NO GROWTH TO DATE CULTURE WILL BE HELD FOR 5 DAYS BEFORE ISSUING A FINAL NEGATIVE REPORT Performed at [facility]    Report Status PENDING  Incomplete  Culture, blood (routine x 2)     Status: None (Preliminary result)   Collection Time: 06/24/14  6:53 PM  Result Value Ref Range Status   Specimen Description BLOOD LEFT HAND  Final   Special Requests BOTTLES DRAWN AEROBIC AND ANAEROBIC 5CC  Final   Culture   Final           BLOOD CULTURE RECEIVED NO GROWTH TO DATE CULTURE WILL BE HELD FOR 5 DAYS BEFORE ISSUING A FINAL NEGATIVE REPORT Performed at [facility]    Report Status PENDING  Incomplete  Culture, Urine     Status: None   Collection Time: 06/24/14  7:50 PM  Result Value Ref Range Status   Specimen Description URINE, RANDOM  Final   Special Requests Normal  Final   Colony Count NO GROWTH Performed at [facility]   Final   Culture NO GROWTH Performed at [facility]   Final   Report Status 06/26/2014 FINAL  Final  Clostridium Difficile by PCR     Status: None   Collection Time: 06/26/14  8:19 PM  Result Value Ref Range Status   C difficile by pcr NEGATIVE NEGATIVE Final    Studies/Results: No results found.    Assessment/Plan:  Principal Problem:   Thrombotic cerebral infarction Active Problems:   Essential hypertension   Diastolic CHF   Sepsis   Acute encephalopathy   CVA (cerebral infarction)    Michelle Farrell is a 74 y.o. female with readmission after having been found down by her daughter. She  underwent lumbar puncture and 24 hours after receiving IV antibiotics including vancomycin and ceftriaxone and Zosyn. CSF profile showed 28 white blood cells with 79% neutrophils protein of 85 and glucose of 85. She had improvement in her clinical status with her becoming afebrile and less confused. She was taken off antibiotics on March 28 but then became acutely febrile and more confused and was restarted on vancomycin and high-dose ceftriaxone she has had improvement since then fevers have defervesced and cognitively she seems to improve though she still is clearly delirious.  #1 Fever and confusion:  Will go ahead and give her a TWO WEEK course of IV rocephin 2g IVq 12 hours and vancomycin dosed for trough goal of 15-20  The daughter wants start date to be when antibiotics were restarted  I am OK with giving the rocephin 2g q 12 from 06/24/14 thru 07/07/14  I am also OK with giving the vancomycin thru the 11th as well but I have some anxiety about vancomycin potentially causing nephrotoxicity at her most recent level was 20.1  If she were to be discharged the hospital on vancomycin will be critical that a home health agency is dosing her vancomycin or that she is in a nursing facility with a home health agency pharmacist dosing the vancomycin.  Please have all labs faxed to the infectious disease clinic attention Dr. Tommy Medal and Dr. Megan Salon at [phone]  We will arrange [facility] in the next 2-3 weeks  I would not pursue CT abdomen  and pelvis unless she clinically worsens.  I will sign off for now please call back with further questions.      LOS: 3 days   Alcide Evener 06/30/2014, 3:50 PM

## 2014-06-30 NOTE — Progress Notes (Signed)
74 y.o. female with DM, chronic pain, hypertension, Crohn's disease, recent discharged on 06/16/14 past admission for gastroenteritis with dehydration. She was readmitted on 06/19/14 past found unresponsive on the floor by her granddaughter. She was febrile with elevated lactate and UA with 3-6 WBCs and + nitrites. Family occasional twitching and patient with neck stiffness and Dr.Kirpatick recommended Keppra as well as LP due to concerns of meningitis or encephalitis. She was started on Vanc, ceftriaxone, ampicillin and acyclovir. Urine culture grew 80,000 colonies E coli but AMS persisted.  LP done then by IR on day 2 of hospitalization and noted 28 WBCs with 79% neutrophils, mildly elevated glucose and mildly elevated protein to 85 with some RBCs in sample. CSF and blood cultures have remained negative. HSV negative. MRI brain showed acute/subacute 10 mm nonhemorrhagic infarct within the posterior medial left thalamus Subjective/Complaints: Per RN , more alert today Denies Resp problems, bowel or bladder issues ROS limited by cognition  Objective: Vital Signs: Blood pressure 115/42, pulse 55, temperature 98.2 F (36.8 C), temperature source Oral, resp. rate 18, SpO2 98 %. No results found. Results for orders placed or performed during the hospital encounter of 06/27/14 (from the past 72 hour(s))  Glucose, capillary     Status: Abnormal   Collection Time: 06/27/14  4:35 PM  Result Value Ref Range   Glucose-Capillary 125 (H) 70 - 99 mg/dL  Glucose, capillary     Status: Abnormal   Collection Time: 06/27/14  8:56 PM  Result Value Ref Range   Glucose-Capillary 169 (H) 70 - 99 mg/dL  Vancomycin, trough     Status: Abnormal   Collection Time: 06/28/14  4:30 AM  Result Value Ref Range   Vancomycin Tr 20.1 (H) 10.0 - 20.0 ug/mL  CBC WITH DIFFERENTIAL     Status: Abnormal   Collection Time: 06/28/14  4:30 AM  Result Value Ref Range   WBC 5.7 4.0 - 10.5 K/uL   RBC 2.87 (L) 3.87 -  5.11 MIL/uL   Hemoglobin 8.6 (L) 12.0 - 15.0 g/dL   HCT 24.9 (L) 36.0 - 46.0 %   MCV 86.8 78.0 - 100.0 fL   MCH 30.0 26.0 - 34.0 pg   MCHC 34.5 30.0 - 36.0 g/dL   RDW 15.3 11.5 - 15.5 %   Platelets 176 150 - 400 K/uL   Neutrophils Relative % 40 (L) 43 - 77 %   Neutro Abs 2.3 1.7 - 7.7 K/uL   Lymphocytes Relative 46 12 - 46 %   Lymphs Abs 2.6 0.7 - 4.0 K/uL   Monocytes Relative 11 3 - 12 %   Monocytes Absolute 0.6 0.1 - 1.0 K/uL   Eosinophils Relative 3 0 - 5 %   Eosinophils Absolute 0.2 0.0 - 0.7 K/uL   Basophils Relative 0 0 - 1 %   Basophils Absolute 0.0 0.0 - 0.1 K/uL  Comprehensive metabolic panel     Status: Abnormal   Collection Time: 06/28/14  4:30 AM  Result Value Ref Range   Sodium 138 135 - 145 mmol/L   Potassium 3.7 3.5 - 5.1 mmol/L   Chloride 113 (H) 96 - 112 mmol/L   CO2 22 19 - 32 mmol/L   Glucose, Bld 121 (H) 70 - 99 mg/dL   BUN <5 (L) 6 - 23 mg/dL   Creatinine, Ser 0.85 0.50 - 1.10 mg/dL   Calcium 9.2 8.4 - 10.5 mg/dL   Total Protein 5.2 (L) 6.0 - 8.3 g/dL   Albumin 2.8 (L) 3.5 -  5.2 g/dL   AST 21 0 - 37 U/L   ALT 28 0 - 35 U/L   Alkaline Phosphatase 46 39 - 117 U/L   Total Bilirubin 0.4 0.3 - 1.2 mg/dL   GFR calc non Af Amer 67 (L) >90 mL/min   GFR calc Af Amer 77 (L) >90 mL/min    Comment: (NOTE) The eGFR has been calculated using the CKD EPI equation. This calculation has not been validated in all clinical situations. eGFR's persistently <90 mL/min signify possible Chronic Kidney Disease.    Anion gap 3 (L) 5 - 15  Glucose, capillary     Status: Abnormal   Collection Time: 06/28/14  6:54 AM  Result Value Ref Range   Glucose-Capillary 119 (H) 70 - 99 mg/dL  Glucose, capillary     Status: Abnormal   Collection Time: 06/28/14 11:57 AM  Result Value Ref Range   Glucose-Capillary 162 (H) 70 - 99 mg/dL  Glucose, capillary     Status: Abnormal   Collection Time: 06/28/14  5:23 PM  Result Value Ref Range   Glucose-Capillary 115 (H) 70 - 99 mg/dL   Glucose, capillary     Status: Abnormal   Collection Time: 06/28/14  8:59 PM  Result Value Ref Range   Glucose-Capillary 198 (H) 70 - 99 mg/dL   Comment 1 Notify RN   Glucose, capillary     Status: Abnormal   Collection Time: 06/29/14  6:29 AM  Result Value Ref Range   Glucose-Capillary 132 (H) 70 - 99 mg/dL   Comment 1 Notify RN   Glucose, capillary     Status: Abnormal   Collection Time: 06/29/14 11:46 AM  Result Value Ref Range   Glucose-Capillary 177 (H) 70 - 99 mg/dL  Glucose, capillary     Status: Abnormal   Collection Time: 06/29/14  4:36 PM  Result Value Ref Range   Glucose-Capillary 109 (H) 70 - 99 mg/dL  Glucose, capillary     Status: Abnormal   Collection Time: 06/29/14  8:52 PM  Result Value Ref Range   Glucose-Capillary 172 (H) 70 - 99 mg/dL  Glucose, capillary     Status: Abnormal   Collection Time: 06/30/14  6:39 AM  Result Value Ref Range   Glucose-Capillary 120 (H) 70 - 99 mg/dL     HEENT: edentulous Cardio: RRR and no murmur Resp: CTA B/L and unlabored GI: BS positive and NT,ND Extremity:  Pulses positive and No Edema Skin:   Intact Neuro: Confused, Flat, Abnormal Sensory difficult to assess due to cognition, Abnormal Motor 4/5 in BUE and BLE and Abnormal FMC Ataxic/ dec FMC Musc/Skel:  Other no pain with BUE adn BLE ROM Gen NAD Perseveration  Assessment/Plan: 1. Functional deficits secondary to  Left medial thalamic infarct thrombosis SVDwhich require 3+ hours per day of interdisciplinary therapy in a comprehensive inpatient rehab setting. Physiatrist is providing close team supervision and 24 hour management of active medical problems listed below. Physiatrist and rehab team continue to assess barriers to discharge/monitor patient progress toward functional and medical goals. FIM: FIM - Bathing Bathing Steps Patient Completed: Chest, Right Arm, Left Arm, Abdomen, Front perineal area, Buttocks, Right upper leg, Left upper leg Bathing: 4: Min-Patient  completes 8-9 66f10 parts or 75+ percent  FIM - Upper Body Dressing/Undressing Upper body dressing/undressing: 0: Wears gown/pajamas-no public clothing (due to PICC line) FIM - Lower Body Dressing/Undressing Lower body dressing/undressing steps patient completed: Thread/unthread right underwear leg, Thread/unthread left underwear leg, Pull underwear up/down, Thread/unthread  right pants leg, Thread/unthread left pants leg, Pull pants up/down, Don/Doff right sock, Don/Doff left sock Lower body dressing/undressing: 4: Min-Patient completed 75 plus % of tasks  FIM - Toileting Toileting steps completed by patient: Adjust clothing prior to toileting, Performs perineal hygiene, Adjust clothing after toileting Toileting:  (mod cues)  FIM - Air cabin crew Transfers: 4-To toilet/BSC: Min A (steadying Pt. > 75%), 4-From toilet/BSC: Min A (steadying Pt. > 75%)  FIM - Bed/Chair Transfer Bed/Chair Transfer Assistive Devices: Arm rests Bed/Chair Transfer: 4: Bed > Chair or W/C: Min A (steadying Pt. > 75%), 4: Chair or W/C > Bed: Min A (steadying Pt. > 75%)  FIM - Locomotion: Wheelchair Distance: 0 ft, patient unable to follow commands to sequence wheelchair mobility using either BUE or BLE Locomotion: Wheelchair: 1: Total Assistance/staff pushes wheelchair (Pt<25%) FIM - Locomotion: Ambulation Locomotion: Ambulation Assistive Devices: Other (comment) (none) Ambulation/Gait Assistance: 4: Min guard Locomotion: Ambulation: 2: Travels 50 - 149 ft with minimal assistance (Pt.>75%)  Comprehension Comprehension Mode: Auditory Comprehension: 2-Understands basic 25 - 49% of the time/requires cueing 51 - 75% of the time  Expression Expression Mode: Verbal Expression: 1-Expresses basis less than 25% of the time/requires cueing greater than 75% of the time.  Social Interaction Social Interaction: 2-Interacts appropriately 25 - 49% of time - Needs frequent redirection.  Problem Solving Problem  Solving Mode: Not assessed Problem Solving: 2-Solves basic 25 - 49% of the time - needs direction more than half the time to initiate, plan or complete simple activities  Memory Memory Mode: Not assessed Memory: 1-Recognizes or recalls less than 25% of the time/requires cueing greater than 75% of the time  Medical Problem List and Plan: 1. Functional deficits secondary toleft medial thalamic infarct 2. DVT Prophylaxis/Anticoagulation: Pharmaceutical: Lovenox 3. Pain Management: tylenol prn for pain 4. Mood: LCSW to follow for evaluation and support as mentation improves. 5. Neuropsych: This patient is not capable of making decisions on her own behalf. 6. Skin/Wound Care: Routine pressure relief measures.  7. Fluids/Electrolytes/Nutrition: Monitor I/O. Check lytes in am saturday  8. HTN: monitor BP every 8 hours. Continue lisinopril, aldactone and catapres. 9. Fevers: Continue high dose ceftriaxone for two weeks for presumed meningitis.  -also with recent UTI however last ucx neg, blood culture pending -consider abdominal CT Monday pending course -cbc tomorrow 10. ABLA?: Has had drop in H/H from 12--> 8.1. Question Hemodilution. Monitor for signs of bleeding. -recheck CBC in AM Saturday  11. Crohn's disease: Off azulfidine at this time. No complaints of abdominal distress/diarrhea 12. DM type 2: Monitor BS with achs checks. Off Januvia due to poor intake. Will use SSI for now and resume oral meds as indicated 13. CKD: Improved post hydration. Baseline Cr-1.5-1.9 in the past years.   LOS (Days) 3 A FACE TO FACE EVALUATION WAS PERFORMED  Malaia Buchta E 06/30/2014, 8:59 AM

## 2014-06-30 NOTE — IPOC Note (Addendum)
Overall Plan of Care [facility]) Patient Details Name: Michelle Farrell MRN: [id number] DOB: 1940-09-26  Admitting Diagnosis: CVA RIght Hemiparesis  Hospital Problems: Principal Problem:   Thrombotic cerebral infarction Active Problems:   Essential hypertension   Diastolic CHF   Sepsis   Acute encephalopathy   CVA (cerebral infarction)   Bacterial meningitis     Functional Problem List: Nursing Bladder, Bowel, Safety, Sensory, Medication Management, Nutrition, Motor, Perception  PT Balance, Behavior, Endurance, Motor, Perception, Safety  OT Balance, Behavior, Cognition, Endurance, Safety  SLP Cognition, Linguistic, Nutrition  TR         Basic ADL's: OT Eating, Grooming, Bathing, Dressing, Toileting     Advanced  ADL's: OT       Transfers: PT Bed Mobility, Bed to Chair, Car, [employer], Floor  OT Toilet, Metallurgist: PT Ambulation, Emergency planning/management officer, Stairs     Additional Impairments: OT None  SLP Swallowing, Communication, Social Cognition expression Problem Solving, Memory, Attention, Awareness  TR      Anticipated Outcomes Item Anticipated Outcome  Self Feeding supervision with min cues  Swallowing  Supervision with least restrictive diet    Basic self-care  supervision with min cues  Toileting  supervision with min cues   Bathroom Transfers supervision with min cues  Bowel/Bladder  Continent with timed toileting  Transfers  supervision due to cognitive impairments  Locomotion  supervision ambulatory due to cognitive impairments  Communication  Min assist for initiation of functional communication   Cognition  Min assist for basic   Pain     Safety/Judgment  Increased safety awareness; anticipates needs; no injury, falls this admission.   Therapy Plan: PT Intensity: Minimum of 1-2 x/day ,45 to 90 minutes PT Frequency: 5 out of 7 days PT Duration Estimated Length of Stay: 7-10 days OT Intensity: Minimum of 1-2 x/day, 45 to 90  minutes OT Frequency: 5 out of 7 days OT Duration/Estimated Length of Stay: 8-10 days SLP Intensity: Minumum of 1-2 x/day, 30 to 90 minutes SLP Frequency: 3 to 5 out of 7 days SLP Duration/Estimated Length of Stay: 7 days        Team Interventions: Nursing Interventions Patient/Family Education, Bladder Management, Bowel Management, Disease Management/Prevention, Discharge Planning, Cognitive Remediation/Compensation, Medication Management  PT interventions Ambulation/gait training, Balance/vestibular training, Cognitive remediation/compensation, Discharge planning, Disease management/prevention, DME/adaptive equipment instruction, Functional mobility training, Neuromuscular re-education, Patient/family education, Psychosocial support, Stair training, Therapeutic Activities, Therapeutic Exercise, UE/LE Strength taining/ROM, UE/LE Coordination activities, Wheelchair propulsion/positioning  OT Interventions Balance/vestibular training, Cognitive remediation/compensation, Discharge planning, Functional mobility training, Patient/family education, Self Care/advanced ADL retraining, Therapeutic Activities, Visual/perceptual remediation/compensation  SLP Interventions Cognitive remediation/compensation, Cueing hierarchy, Functional tasks, Dysphagia/aspiration precaution training, Patient/family education, Internal/external aids, Environmental controls  TR Interventions    SW/CM Interventions Discharge Planning, Psychosocial Support, Patient/Family Education    Team Discharge Planning: Destination: PT-Home ,OT- Home , SLP-Home Projected Follow-up: PT-Home health PT, 24 hour supervision/assistance, OT-  Home health OT, 24 hour supervision/assistance, SLP-Home Health SLP, 24 hour supervision/assistance, Outpatient SLP Projected Equipment Needs: PT-None recommended by PT, OT- Tub/shower seat, SLP-None recommended by SLP Equipment Details: PT- , OT-  Patient/family involved in discharge planning: PT-  Patient unable/family or caregiver not available,  OT-Family member/caregiver, SLP-Patient unable/family or caregive not available  MD ELOS: 10-16d Medical Rehab Prognosis:  Good Assessment: 74 y.o. female with DM, chronic pain, hypertension, Crohn's disease, recent discharged on 06/16/14 past admission for gastroenteritis with dehydration. She was readmitted on 06/19/14 past found unresponsive on the floor by  her granddaughter. She was febrile with elevated lactate and UA with 3-6 WBCs and + nitrites. Family occasional twitching and patient with neck stiffness and Dr.Kirpatick recommended Keppra as well as LP due to concerns of meningitis or encephalitis. She was started on Vanc, ceftriaxone, ampicillin and acyclovir. Urine culture grew 80,000 colonies E coli but AMS persisted.  LP done then by IR on day 2 of hospitalization and noted 28 WBCs with 79% neutrophils, mildly elevated glucose and mildly elevated protein to 85 with some RBCs in sample. CSF and blood cultures have remained negative. HSV negative. MRI brain showed acute/subacute 10 mm nonhemorrhagic infarct within the posterior medial left thalamus   Now requiring 24/7 Rehab RN,MD, as well as CIR level PT, OT and SLP.  Treatment team will focus on ADLs and mobility with goals set at sup/min A See Team Conference Notes for weekly updates to the plan of care

## 2014-06-30 NOTE — Progress Notes (Signed)
Occupational Therapy Session Note  Patient Details  Name: Michelle Farrell MRN: [id number] Date of Birth: 21-Dec-1940  Today's Date: 06/30/2014 OT Individual Time: 0933-1030 OT Individual Time Calculation (min): 57 min    Short Term Goals: No short term goals set  Skilled Therapeutic Interventions/Progress Updates:    Pt seen for BADL retraining in room with a focus on attention to task along with reducing motor perseverations. Pt much more active and conversational today. She does continue to perseverate on a topic, but spoke in more complete sentences.  During self care, pt demonstrated good balance in that she only required supervision in ambulating in room and on unit. Pt stated she was very tired and wanted to go bed but was willing to participate.  She continues to need mod cues during bathing as she will perseverate her motor movements on body parts and need a tactile cue to move her hand to a different area.  She did not need any cues during toileting or donning LB clothing. Pt ambulated to day room to engage in a color/ shape sorting card activity but needed max cues throughout activity. Pt returned to room and got in bed with S. Bed alarm set and call light in reach.  Therapy Documentation Precautions:  Precautions Precautions: Fall Precaution Comments: Impulsive Restrictions Weight Bearing Restrictions: No    Vital Signs: Therapy Vitals BP: (!) 122/56 mmHg Pain: Pain Assessment Pain Assessment: No/denies pain ADL: ADL  See FIM for current functional status  Therapy/Group: Individual Therapy  [place] 06/30/2014, 12:31 PM

## 2014-07-01 ENCOUNTER — Inpatient Hospital Stay (HOSPITAL_COMMUNITY): Payer: Medicare Other | Admitting: Speech Pathology

## 2014-07-01 ENCOUNTER — Inpatient Hospital Stay (HOSPITAL_COMMUNITY): Payer: Medicare Other | Admitting: *Deleted

## 2014-07-01 ENCOUNTER — Inpatient Hospital Stay (HOSPITAL_COMMUNITY): Payer: Medicare Other | Admitting: Occupational Therapy

## 2014-07-01 LAB — CULTURE, BLOOD (ROUTINE X 2)
Culture: NO GROWTH
Culture: NO GROWTH

## 2014-07-01 LAB — GLUCOSE, CAPILLARY
GLUCOSE-CAPILLARY: 164 mg/dL — AB (ref 70–99)
Glucose-Capillary: 94 mg/dL (ref 70–99)
Glucose-Capillary: 116 mg/dL — ABNORMAL HIGH (ref 70–99)
Glucose-Capillary: 151 mg/dL — ABNORMAL HIGH (ref 70–99)

## 2014-07-01 MED ORDER — ASPIRIN 325 MG PO TABS
325.0000 mg | ORAL_TABLET | Freq: Every day | ORAL | Status: DC
  Administered 2014-07-01 – 2014-07-06 (×6): 325 mg via ORAL
  Filled 2014-07-01 (×10): qty 1

## 2014-07-01 NOTE — Progress Notes (Signed)
Occupational Therapy Session Note  Patient Details  Name: Michelle Farrell MRN: [id number] Date of Birth: 1941/01/19  Today's Date: 07/01/2014 OT Individual Time: 1000-1100 OT Individual Time Calculation (min): 60 min    Short Term Goals: Week 1:  OT Short Term Goal 1 (Week 1): STGs = LTGs due to short LOS  Skilled Therapeutic Interventions/Progress Updates:    Pt seen for BADL retraining with a focus on sequencing of steps without perseverations. Pt received sitting at nursing station. In room, pt ambulated with close S as she toileted, bathed and dressed. Today pt had more difficulty moving through tasks than she did yesterday and needed max cues to adequately complete steps and move on to the next step. For example in bathing, she will perseverate on one body part for extended periods and need max and assertive verbal and tactile cues to move onto another part. Pt is very pleasant and cooperative with all activities, but is extremely disoriented.  Pt taken to family room to sit on couch to review orientation. She would continually perseverate on a fact, such as a birthdate and would continually repeat herself.  Very challenging to cue her to move on. Worked on [employer] in a magazine with 25% accuracy.  Pt believed the nursing staff were her sisters and that I knew her from her neighborhood. Pt requested to lay down. Adjusted into bed with bed alarm set and call light in reach.  Therapy Documentation Precautions:  Precautions Precautions: Fall Precaution Comments: Impulsive Restrictions Weight Bearing Restrictions: No    Vital Signs: Therapy Vitals BP: (!) 147/49 mmHg Pain: Pain Assessment Pain Assessment: No/denies pain ADL: ADL  See FIM for current functional status  Therapy/Group: Individual Therapy  [place] 07/01/2014, 12:31 PM

## 2014-07-01 NOTE — Progress Notes (Signed)
ANTIBIOTIC CONSULT NOTE - FOLLOW UP  Pharmacy Consult for vancomycin Indication: probable meningits  No Known Allergies  Patient Measurements:   Adjusted Body Weight:   Vital Signs: Temp: 98.9 F (37.2 C) (04/05 0453) Temp Source: Oral (04/05 0453) BP: 132/44 mmHg (04/05 0453) Pulse Rate: 64 (04/05 0453) Intake/Output from previous day: 04/04 0701 - 04/05 0700 In: 620 [P.O.:600; I.V.:20] Out: -  Intake/Output from this shift:    Labs: No results for input(s): WBC, HGB, PLT, LABCREA, CREATININE in the last 72 hours. Estimated Creatinine Clearance: 61.4 mL/min (by C-G formula based on Cr of 0.85). No results for input(s): VANCOTROUGH, VANCOPEAK, VANCORANDOM, GENTTROUGH, GENTPEAK, GENTRANDOM, TOBRATROUGH, TOBRAPEAK, TOBRARND, AMIKACINPEAK, AMIKACINTROU, AMIKACIN in the last 72 hours.   Microbiology: Recent Results (from the past 720 hour(s))  Blood Culture (routine x 2)     Status: None   Collection Time: 06/19/14 12:23 PM  Result Value Ref Range Status   Specimen Description BLOOD LEFT ANTECUBITAL  Final   Special Requests BOTTLES DRAWN AEROBIC AND ANAEROBIC 3CC  Final   Culture   Final    NO GROWTH 5 DAYS Performed at [facility]    Report Status 06/25/2014 FINAL  Final  Blood Culture (routine x 2)     Status: None   Collection Time: 06/19/14 12:36 PM  Result Value Ref Range Status   Specimen Description BLOOD LEFT ANTECUBITAL  Final   Special Requests BOTTLES DRAWN AEROBIC AND ANAEROBIC 5ML  Final   Culture   Final    NO GROWTH 5 DAYS Performed at [facility]    Report Status 06/25/2014 FINAL  Final  Urine culture     Status: None   Collection Time: 06/19/14  2:32 PM  Result Value Ref Range Status   Specimen Description URINE, CLEAN CATCH  Final   Special Requests NONE  Final   Colony Count   Final    80,000 COLONIES/ML Performed at [facility]    Culture   Final    ESCHERICHIA COLI Performed at [facility]    Report  Status 06/21/2014 FINAL  Final   Organism ID, Bacteria ESCHERICHIA COLI  Final      Susceptibility   Escherichia coli - MIC*    AMPICILLIN >=32 RESISTANT Resistant     CEFAZOLIN >=64 RESISTANT Resistant     CEFTRIAXONE <=1 SENSITIVE Sensitive     CIPROFLOXACIN >=4 RESISTANT Resistant     GENTAMICIN <=1 SENSITIVE Sensitive     LEVOFLOXACIN >=8 RESISTANT Resistant     NITROFURANTOIN <=16 SENSITIVE Sensitive     TOBRAMYCIN <=1 SENSITIVE Sensitive     TRIMETH/SULFA >=320 RESISTANT Resistant     PIP/TAZO >=128 RESISTANT Resistant     * ESCHERICHIA COLI  MRSA PCR Screening     Status: None   Collection Time: 06/19/14  8:30 PM  Result Value Ref Range Status   MRSA by PCR NEGATIVE NEGATIVE Final    Comment:        The GeneXpert MRSA Assay (FDA approved for NASAL specimens only), is one component of a comprehensive MRSA colonization surveillance program. It is not intended to diagnose MRSA infection nor to guide or monitor treatment for MRSA infections.   CSF culture     Status: None   Collection Time: 06/20/14 11:33 AM  Result Value Ref Range Status   Specimen Description CSF  Final   Special Requests NONE  Final   Gram Stain   Final    FEW WBC PRESENT,  PREDOMINANTLY PMN NO ORGANISMS SEEN Gram Stain Report Called to,Read Back By and Verified With: Gram Stain Report Called to,Read Back By and Verified With: ALLISON DUNDON [id number]@1 :00AM BJENN Performed at [facility]   Final    NO GROWTH 3 DAYS Performed at [facility]    Report Status 06/24/2014 FINAL  Final  Gram stain     Status: None   Collection Time: 06/20/14 11:33 AM  Result Value Ref Range Status   Specimen Description CSF  Final   Special Requests NONE  Final   Gram Stain   Final    CYTOSPIN PREP WBC PRESENT, PREDOMINANTLY PMN NO ORGANISMS SEEN    Report Status 06/20/2014 FINAL  Final  Culture, blood (routine x 2)     Status: None   Collection Time: 06/24/14  6:44 PM  Result Value  Ref Range Status   Specimen Description BLOOD LEFT HAND  Final   Special Requests BOTTLES DRAWN AEROBIC ONLY 3CC  Final   Culture   Final    NO GROWTH 5 DAYS Performed at [facility]    Report Status 07/01/2014 FINAL  Final  Culture, blood (routine x 2)     Status: None   Collection Time: 06/24/14  6:53 PM  Result Value Ref Range Status   Specimen Description BLOOD LEFT HAND  Final   Special Requests BOTTLES DRAWN AEROBIC AND ANAEROBIC 5CC  Final   Culture   Final    NO GROWTH 5 DAYS Performed at [facility]    Report Status 07/01/2014 FINAL  Final  Culture, Urine     Status: None   Collection Time: 06/24/14  7:50 PM  Result Value Ref Range Status   Specimen Description URINE, RANDOM  Final   Special Requests Normal  Final   Colony Count NO GROWTH Performed at [facility]   Final   Culture NO GROWTH Performed at [facility]   Final   Report Status 06/26/2014 FINAL  Final  Clostridium Difficile by PCR     Status: None   Collection Time: 06/26/14  8:19 PM  Result Value Ref Range Status   C difficile by pcr NEGATIVE NEGATIVE Final    Anti-infectives    Start     Dose/Rate Route Frequency Ordered Stop   06/27/14 1700  cefTRIAXone (ROCEPHIN) 2 g in dextrose 5 % 50 mL IVPB - Premix     2 g 100 mL/hr over 30 Minutes Intravenous Every 12 hours 06/27/14 1601     06/27/14 1700  vancomycin (VANCOCIN) IVPB 750 mg/150 ml premix     750 mg 150 mL/hr over 60 Minutes Intravenous Every 12 hours 06/27/14 1601        Assessment: 74 yo female with probably meningitis is currently on day 8 of vancomycin and ceftriaxone therapy.  Last trough was 17.6 on 04/1  Goal of Therapy:  Vancomycin trough level 15-20 mcg/ml  Plan:  Continue Vancomycin 759m IV q12h Continue Rocephin 2gm IV q12h Will f/u renal function, pt's clinical condition, Vanc trough weekly on Friday  [place] Eager, Tsz[place]in 07/01/2014,8:33 AM

## 2014-07-01 NOTE — Progress Notes (Signed)
Speech Language Pathology Daily Session Note  Patient Details  Name: Michelle Farrell MRN: [id number] Date of Birth: 1940/07/27  Today's Date: 07/01/2014 SLP Individual Time: 0900-1000 SLP Individual Time Calculation (min): 60 min  Short Term Goals: Week 1: SLP Short Term Goal 1 (Week 1): STG + LTG due to estimated length of stay   Skilled Therapeutic Interventions:  Pt was seen for skilled ST targeting cognitive goals.  Upon arrival, pt was seated upright in wheelchair with her 2 sisters present.  SLP provided max assist verbal and visual cues to reorient to exact date with the use of a daily calendar; however, she was oriented to place with min verbal cues and to situation with supervision question cues and extra time.  SLP facilitated the session with a structured, basic sorting task targeting sustained attention.  Pt required max to total assist to complete the abovementioned task due to decreased sustained attention and perseveration from previous trials.  When engaged in more functional, familiar tasks, such as making the bed or completing oral care, pt demonstrated slightly improved sustained attention to task for ~1 minute before requiring mod verbal, visual, and tactile cues for redirection.  Pt's sisters verified that pt is significantly altered from her baseline.  Continue per current plan of care.    FIM:  Comprehension Comprehension Mode: Auditory Comprehension: 2-Understands basic 25 - 49% of the time/requires cueing 51 - 75% of the time Expression Expression Mode: Verbal Expression: 2-Expresses basic 25 - 49% of the time/requires cueing 50 - 75% of the time. Uses single words/gestures. Social Interaction Social Interaction: 2-Interacts appropriately 25 - 49% of time - Needs frequent redirection. Problem Solving Problem Solving: 1-Solves basic less than 25% of the time - needs direction nearly all the time or does not effectively solve problems and may need a restraint for  safety Memory Memory: 1-Recognizes or recalls less than 25% of the time/requires cueing greater than 75% of the time  Pain Pain Assessment Pain Assessment: No/denies pain  Therapy/Group: Individual Therapy  Daaiel Starlin, Selinda Orion 07/01/2014, 12:30 PM

## 2014-07-01 NOTE — Progress Notes (Signed)
Physical Therapy Session Note  Patient Details  Name: Michelle Farrell MRN: [id number] Date of Birth: 07/27/1940  Today's Date: 07/01/2014 PT Individual Time: 1300-1415 PT Individual Time Calculation (min): 75 min   Short Term Goals: Week 1:  PT Short Term Goal 1 (Week 1): = LTGs due to anticipated LOS  Skilled Therapeutic Interventions/Progress Updates:    Skilled PT tx and co-treat with recreation therapy focused on functional mobility, balance training, and cognitive remediation. Pt up in room with granddaughter, [name] present.   Gait training in controlled and apartment seting without deivce>>SPC due to pt grabbing walls and objects consistently. Pt's BOS and balance improved from Min A to occasional S in simple settings with SPC 2x100' and 1x200', 1x75' with safety cues and wayfinding. Pt unable to recall room number or location.   Performed cognitive remediation via sustained and alternating attention, memory, mental flexibility, and problem solving.These were addressed during functional kitchen tasks, puzzle, card games, and problem solving safety pictures. Pt's activity tolerance and standing balance was challenged throughout as tolerated. Pt had difficulty naming simple objects, except for in the kitchen, such as "cow." She was unable to attend to puzzle task or identify pieces position when given only 2 choices. Pt was able to count cards and complete a game of War, however she had no carryover from playing it yesterday. Pt required cues for rules of the game, but was able to consistently select winning set.   Pt left up in [facility] with lap belt and reoriented to call bell.   Therapy Documentation Precautions:  Precautions Precautions: Fall Precaution Comments: Impulsive Restrictions Weight Bearing Restrictions: No   Pain: Pain Assessment Pain Assessment: No/denies pain  See FIM for current functional status  Therapy/Group: Individual Therapy  Kennieth Rad, PT,  DPT   07/01/2014, 1:33 PM

## 2014-07-02 ENCOUNTER — Inpatient Hospital Stay (HOSPITAL_COMMUNITY): Payer: Medicare Other | Admitting: Occupational Therapy

## 2014-07-02 ENCOUNTER — Inpatient Hospital Stay (HOSPITAL_COMMUNITY): Payer: Medicare Other | Admitting: Speech Pathology

## 2014-07-02 ENCOUNTER — Inpatient Hospital Stay (HOSPITAL_COMMUNITY): Payer: Medicare Other

## 2014-07-02 LAB — GLUCOSE, CAPILLARY
Glucose-Capillary: 124 mg/dL — ABNORMAL HIGH (ref 70–99)
Glucose-Capillary: 162 mg/dL — ABNORMAL HIGH (ref 70–99)
Glucose-Capillary: 123 mg/dL — ABNORMAL HIGH (ref 70–99)
Glucose-Capillary: 203 mg/dL — ABNORMAL HIGH (ref 70–99)

## 2014-07-02 NOTE — Progress Notes (Signed)
74 y.o. female with DM, chronic pain, hypertension, Crohn's disease, recent discharged on 06/16/14 past admission for gastroenteritis with dehydration. She was readmitted on 06/19/14 past found unresponsive on the floor by her granddaughter. She was febrile with elevated lactate and UA with 3-6 WBCs and + nitrites. Family occasional twitching and patient with neck stiffness and Dr.Kirpatick recommended Keppra as well as LP due to concerns of meningitis or encephalitis. She was started on Vanc, ceftriaxone, ampicillin and acyclovir. Urine culture grew 80,000 colonies E coli but AMS persisted.  LP done then by IR on day 2 of hospitalization and noted 28 WBCs with 79% neutrophils, mildly elevated glucose and mildly elevated protein to 85 with some RBCs in sample. CSF and blood cultures have remained negative. HSV negative. MRI brain showed acute/subacute 10 mm nonhemorrhagic infarct within the posterior medial left thalamus Subjective/Complaints: "I'm playing strip poker",  With rec tx ROS limited by cognition  Objective: Vital Signs: Blood pressure 142/58, pulse 57, temperature 98.4 F (36.9 C), temperature source Oral, resp. rate 18, SpO2 100 %. No results found. Results for orders placed or performed during the hospital encounter of 06/27/14 (from the past 72 hour(s))  Glucose, capillary     Status: Abnormal   Collection Time: 06/29/14 11:46 AM  Result Value Ref Range   Glucose-Capillary 177 (H) 70 - 99 mg/dL  Glucose, capillary     Status: Abnormal   Collection Time: 06/29/14  4:36 PM  Result Value Ref Range   Glucose-Capillary 109 (H) 70 - 99 mg/dL  Glucose, capillary     Status: Abnormal   Collection Time: 06/29/14  8:52 PM  Result Value Ref Range   Glucose-Capillary 172 (H) 70 - 99 mg/dL  Glucose, capillary     Status: Abnormal   Collection Time: 06/30/14  6:39 AM  Result Value Ref Range   Glucose-Capillary 120 (H) 70 - 99 mg/dL  Glucose, capillary     Status: Abnormal   Collection Time: 06/30/14 11:30 AM  Result Value Ref Range   Glucose-Capillary 136 (H) 70 - 99 mg/dL  Glucose, capillary     Status: Abnormal   Collection Time: 06/30/14  4:35 PM  Result Value Ref Range   Glucose-Capillary 128 (H) 70 - 99 mg/dL  Glucose, capillary     Status: Abnormal   Collection Time: 06/30/14  8:37 PM  Result Value Ref Range   Glucose-Capillary 172 (H) 70 - 99 mg/dL  Glucose, capillary     Status: Abnormal   Collection Time: 07/01/14  6:44 AM  Result Value Ref Range   Glucose-Capillary 164 (H) 70 - 99 mg/dL   Comment 1 Notify RN   Glucose, capillary     Status: None   Collection Time: 07/01/14 11:57 AM  Result Value Ref Range   Glucose-Capillary 94 70 - 99 mg/dL  Glucose, capillary     Status: Abnormal   Collection Time: 07/01/14  4:28 PM  Result Value Ref Range   Glucose-Capillary 116 (H) 70 - 99 mg/dL  Glucose, capillary     Status: Abnormal   Collection Time: 07/01/14  9:45 PM  Result Value Ref Range   Glucose-Capillary 151 (H) 70 - 99 mg/dL  Glucose, capillary     Status: Abnormal   Collection Time: 07/02/14  7:50 AM  Result Value Ref Range   Glucose-Capillary 124 (H) 70 - 99 mg/dL   Comment 1 Notify RN      HEENT: edentulous Cardio: RRR and no murmur Resp: CTA B/L and unlabored GI:  BS positive and NT,ND Extremity:  Pulses positive and No Edema Skin:   Intact Neuro: Confused, Flat, Abnormal Sensory difficult to assess due to cognition, Abnormal Motor 4/5 in BUE and BLE and Abnormal FMC Ataxic/ dec FMC Musc/Skel:  Other no pain with BUE adn BLE ROM Gen NAD Perseveration  Assessment/Plan: 1. Functional deficits secondary to  Left medial thalamic infarct thrombosis SVDwhich require 3+ hours per day of interdisciplinary therapy in a comprehensive inpatient rehab setting. Physiatrist is providing close team supervision and 24 hour management of active medical problems listed below. Physiatrist and rehab team continue to assess barriers to  discharge/monitor patient progress toward functional and medical goals. Team conference today please see physician documentation under team conference tab, met with team face-to-face to discuss problems,progress, and goals. Formulized individual treatment plan based on medical history, underlying problem and comorbidities. May have encephalopathy , infection related as cog def were reportedly not present prior to hosp and CVA location not typical for sig cog def FIM: FIM - Bathing Bathing Steps Patient Completed: Chest, Right Arm, Left Arm, Abdomen, Front perineal area, Buttocks, Right upper leg, Left upper leg, Right lower leg (including foot), Left lower leg (including foot) Bathing: 5: Supervision: Safety issues/verbal cues  FIM - Upper Body Dressing/Undressing Upper body dressing/undressing: 0: Wears gown/pajamas-no public clothing FIM - Lower Body Dressing/Undressing Lower body dressing/undressing steps patient completed: Thread/unthread right underwear leg, Thread/unthread left underwear leg, Pull underwear up/down, Thread/unthread right pants leg, Thread/unthread left pants leg, Pull pants up/down, Don/Doff right sock, Don/Doff left sock, Don/Doff right shoe, Don/Doff left shoe, Fasten/unfasten right shoe, Fasten/unfasten left shoe Lower body dressing/undressing: 5: Supervision: Safety issues/verbal cues  FIM - Toileting Toileting steps completed by patient: Adjust clothing prior to toileting, Performs perineal hygiene, Adjust clothing after toileting Toileting Assistive Devices: Grab bar or rail for support Toileting: 5: Supervision: Safety issues/verbal cues  FIM - Air cabin crew Transfers: 4-To toilet/BSC: Min A (steadying Pt. > 75%), 4-From toilet/BSC: Min A (steadying Pt. > 75%)  FIM - Control and instrumentation engineer Devices: Cane, Arm rests Bed/Chair Transfer: 5: Bed > Chair or W/C: Supervision (verbal cues/safety issues), 5: Chair or W/C > Bed:  Supervision (verbal cues/safety issues)  FIM - Locomotion: Wheelchair Distance: 0', pt still unable to follow commands to sequence w/c mobility at this time Locomotion: Wheelchair: 0: Activity did not occur FIM - Locomotion: Ambulation Locomotion: Ambulation Assistive Devices: Journalist, newspaper Ambulation/Gait Assistance: 4: Min guard Locomotion: Ambulation: 4: Travels 150 ft or more with minimal assistance (Pt.>75%)  Comprehension Comprehension Mode: Auditory Comprehension: 2-Understands basic 25 - 49% of the time/requires cueing 51 - 75% of the time  Expression Expression Mode: Verbal Expression: 2-Expresses basic 25 - 49% of the time/requires cueing 50 - 75% of the time. Uses single words/gestures.  Social Interaction Social Interaction: 2-Interacts appropriately 25 - 49% of time - Needs frequent redirection.  Problem Solving Problem Solving Mode: Not assessed Problem Solving: 1-Solves basic less than 25% of the time - needs direction nearly all the time or does not effectively solve problems and may need a restraint for safety  Memory Memory Mode: Not assessed Memory: 1-Recognizes or recalls less than 25% of the time/requires cueing greater than 75% of the time  Medical Problem List and Plan: 1. Functional deficits secondary toleft medial thalamic infarct 2. DVT Prophylaxis/Anticoagulation: Pharmaceutical: Lovenox 3. Pain Management: tylenol prn for pain 4. Mood: LCSW to follow for evaluation and support as mentation improves. 5. Neuropsych: This patient is not capable  of making decisions on her own behalf. 6. Skin/Wound Care: Routine pressure relief measures.  7. Fluids/Electrolytes/Nutrition: Monitor I/O. Check lytes in am saturday  8. HTN: monitor BP every 8 hours. Continue lisinopril, aldactone and catapres. 9. Fevers: Continue high dose ceftriaxone for two weeks for presumed meningitis.  -also with recent UTI however last ucx neg, blood culture pending -consider  abdominal CT Monday pending course -cbc tomorrow 10. ABLA?:stable H and H 8.6  .  Monitor for signs of bleeding.  11. Crohn's disease: Off azulfidine at this time. No complaints of abdominal distress/diarrhea 12. DM type 2: Monitor BS with achs checks. Off Januvia due to poor intake. Will use SSI for now and resume oral meds as indicated 13. CKD: Improved post hydration. Baseline Cr-1.5-1.9 in the past years.   LOS (Days) 5 A FACE TO FACE EVALUATION WAS PERFORMED  Dawnisha Marquina E 07/02/2014, 10:08 AM

## 2014-07-02 NOTE — Progress Notes (Signed)
Speech Language Pathology Daily Session Note  Patient Details  Name: Michelle Farrell MRN: [id number] Date of Birth: Jun 01, 1940  Today's Date: 07/02/2014 SLP Individual Time: 1000-1100 SLP Individual Time Calculation (min): 60 min  Short Term Goals: Week 1: SLP Short Term Goal 1 (Week 1): STG + LTG due to estimated length of stay   Skilled Therapeutic Interventions: Skilled therapy session focused on cognitive goals. Upon arrival, patient was awake, sitting up, and participating in simple card task with RT; RT reporting patient sustaining attention to task for ~5 minutes. Student facilitated session with Max A visual, verbal, and tactile cues for simple problem-solving, numerical organization, and selective attention to task for ~90 seconds in a mildly distracting environment and Total A visual and verbal cues for error awareness associated with task. Patient demonstrating some verbal perseveration but able to be redirected with Mod A verbal cues, and required further Mod A visual and verbal cues for orientation to place, month and year. Patient independently oriented to situation. Patient left upright in w/c with quick-release belt donned at nurse's station. Continue with current plan of care.   FIM:  Comprehension Comprehension Mode: Auditory Comprehension: 2-Understands basic 25 - 49% of the time/requires cueing 51 - 75% of the time Expression Expression Mode: Verbal Expression: 2-Expresses basic 25 - 49% of the time/requires cueing 50 - 75% of the time. Uses single words/gestures. Social Interaction Social Interaction: 2-Interacts appropriately 25 - 49% of time - Needs frequent redirection. Problem Solving Problem Solving: 1-Solves basic less than 25% of the time - needs direction nearly all the time or does not effectively solve problems and may need a restraint for safety Memory Memory: 1-Recognizes or recalls less than 25% of the time/requires cueing greater than 75% of the  time  Pain Pain Assessment Pain Assessment: No/denies pain  Therapy/Group: Individual Therapy  Servando Snare 07/02/2014, 12:33 PM

## 2014-07-02 NOTE — Patient Care Conference (Signed)
Inpatient RehabilitationTeam Conference and Plan of Care Update Date: 07/02/2014   Time: 11;10 AM    Patient Name: Michelle Farrell      Medical Record Number: [id number]  Date of Birth: [birth year] Sex: Female         Room/Bed: 4W14C/4W14C-01 Payor Info: Payor: [employer] MEDICARE / Plan: [facility] MEDICARE / Product Type: *No Product type* /    Admitting Diagnosis: CVA R LOMENINSITTIS  Admit Date/Time:  06/27/2014  3:50 PM Admission Comments: No comment available   Primary Diagnosis:  Thrombotic cerebral infarction Principal Problem: Thrombotic cerebral infarction  Patient Active Problem List   Diagnosis Date Noted  . Bacterial meningitis   . 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 Altered mental status   . Blood poisoning   . Fall   . Sepsis secondary to UTI   . Meningitis   . CVA (cerebral infarction)   . Sepsis 06/19/2014  . Acute encephalopathy 06/19/2014  . UTI (lower urinary tract infection) 06/19/2014  . Tobacco abuse 06/19/2014  . Acute gastroenteritis 06/12/2014  . Crohn's disease 06/12/2014  . DM (diabetes mellitus) 06/12/2014  . Metabolic acidosis 47/82/9562  . Hypercalcemia 06/12/2014  . Oral candidiasis 06/12/2014  . Renal failure 05/16/2014  . AKI (acute kidney injury) 05/16/2014  . Dehydration 05/16/2014  . Pelvic mass 05/16/2014  . Hypertension 05/16/2014  . Essential hypertension 05/16/2014  . Diastolic CHF 13/10/6576  . Back pain, chronic 05/16/2014  . Accelerated hypertension 08/28/2013  . DKA (diabetic ketoacidoses) 08/27/2013  . OBSTRUCTIVE SLEEP APNEA 11/14/2008  . DYSPNEA 11/14/2008  . Diabetes mellitus 06/28/2007  . HYPERLIPIDEMIA 06/28/2007  . NEUROPATHY 06/28/2007  . CHF 06/28/2007  . ESOPHAGEAL STRICTURE 06/28/2007  . HIATAL HERNIA 06/28/2007  . Crohn disease 06/28/2007  . POLYARTHRITIS 06/28/2007  . ELEVATED BLOOD PRESSURE 06/28/2007  . ALLERGY 06/28/2007  . PUD, HX OF 06/28/2007    Expected Discharge Date: Expected Discharge  Date: 07/08/14  Team Members Present: Physician leading conference: Dr. Alysia Penna Social Worker Present: Ovidio Kin, LCSW Nurse Present: Heather Roberts, RN PT Present: Georjean Mode, PT;Blair Hobble, PT OT Present: Simonne Come, Dorothyann Gibbs, OT SLP Present: Windell Moulding, SLP PPS Coordinator present : Daiva Nakayama, RN, CRRN     Current Status/Progress Goal Weekly Team Focus  Medical   severe cognitive problems, on IV abx 5d  home d/c with sup  no issues   Bowel/Bladder   Continent bowel and bladder with urinary urgency  remain continent, reduce urgency      Swallow/Nutrition/ Hydration   Dys 2 textures, thin liquids   supervision, set up with least restrictive diet   family education, diet toleration, possible trials of advanced consistencies per improved mentation    ADL's   max cues with all ADLs (due to severely impaired orientation, memory, and perseveration)  min cues with ADLs  ADL retraining with cognitive retraining, family education   Mobility   S transfers, min-guard gait x150' min A stairs, cognition impaired  S all mobility due to cognition  Safety, balance, family training, gait with LRAD, stairs, and cognitive remediation   Communication   Mod-max assist to convey basic needs/wants due to confusion and verbal perseveration   mod assist, downgraded   improve awareness of verbal errors    Safety/Cognition/ Behavioral Observations  Max assist   Mod assist   sustained attention, orientation, basic, familiar functional problem solving    Pain   Denies         Skin   no breakdown, no injury            *  See Care Plan and progress notes for long and short-term goals.  Barriers to Discharge: see above    Possible Resolutions to Barriers:  see above    Discharge Planning/Teaching Needs:  HOme with granduaghter hiring assist and being there the other times.  Grandaughter is hopeful pt will be here 2-2.5 weeks like told when came.      Team Discussion:   Doing well in therapies, moving well, mostly issues are cognitive.  She will require 24 hr supervision at discharge for safety. IV antibiotics until Mon 4/11. L-foot edema MD checking on.  Working on sustained attention  Revisions to Treatment Plan:  None   Continued Need for Acute Rehabilitation Level of Care: The patient requires daily medical management by a physician with specialized training in physical medicine and rehabilitation for the following conditions: Daily direction of a multidisciplinary physical rehabilitation program to ensure safe treatment while eliciting the highest outcome that is of practical value to the patient.: Yes Daily medical management of patient stability for increased activity during participation in an intensive rehabilitation regime.: Yes Daily analysis of laboratory values and/or radiology reports with any subsequent need for medication adjustment of medical intervention for : Neurological problems;Other  Elease Hashimoto 07/03/2014, 8:49 AM

## 2014-07-02 NOTE — Progress Notes (Signed)
Physical Therapy Session Note  Patient Details  Name: Michelle Farrell MRN: [id number] Date of Birth: [birth year]  Today's Date: 07/02/2014 PT Individual Time: 1300-1430 PT Individual Time Calculation (min): 90 min   Short Term Goals: Week 1:  PT Short Term Goal 1 (Week 1): = LTGs due to anticipated LOS  Skilled Therapeutic Interventions/Progress Updates:  Pt speaking out to grandson [place], who was not in the room.  Pt stated she thought he had been with her; NT had just left the room after supervising her for lunch. Pt followed simple command for grasping IV pole as PT transported pt in w/c.  W/c> Nustep with supervision, set up for IV pole. Nustep at level 4 x 8 minutes for activity tolerance, rated " a little bit of work" regarding exertion. Gait on level tile x 225 ' with SPC in R hand, grabbing railing whenever she was close enough. Advanced gait stepping over 6 canes on floor, safely but with varying technique, leading with RLE or LLE. Gait x 120' pushing IV pole, without AD, with supervision/min guard assist and cues for upright posture and forward gaze, during dual task activity of searching for play $5 bills placed in hallway on L and R.  Pt quickly found all 6 bills after cues to look L and R while ambulating; reached out of BOS repeatedly with L or R hand without LOB.   neuromuscular re-education via forced use, compliant foam mat, manual and VCs for: - midline orientation in standing, wt shifting L><R , trunk activation - in sitting for trunk activation, pelvic dissociation and balance reactions -kicking beach ball with R foot in standing for forced wt bearing LLE -scooting forward/backward in sitting without use of UEs -bil calf raises and toe raises in standing, x 5 each  Pt is very reluctant to wt shift L in standing.  When fatigued, she sits suddenly, marginally safe.    Pt ambulated back to room; left sitting in w/c with quick release belt on; NT in room.  Pt requested calling  her family on phone; PT informed NT of this.    Therapy Documentation Precautions:  Precautions Precautions: Fall Precaution Comments: Impulsive Restrictions Weight Bearing Restrictions: No   Pain: Pain Assessment Pain Assessment: No/denies pain   Locomotion : Ambulation Ambulation/Gait Assistance: 4: Min guard;5: Supervision         See FIM for current functional status  Therapy/Group: Individual Therapy  Soila Printup 07/02/2014, 3:48 PM

## 2014-07-02 NOTE — Progress Notes (Signed)
Occupational Therapy Session Note  Patient Details  Name: Michelle Farrell MRN: [id number] Date of Birth: 1940/09/16  Today's Date: 07/02/2014 OT Individual Time: [phone] OT Individual Time Calculation (min): 45 min    Short Term Goals: No short term goals set  Skilled Therapeutic Interventions/Progress Updates:    Pt seen this session for a focus on cognitive skills of attention and orientation. Pt has just completed using the toilet with the nurse tech. No new pants or socks for pt to don today and she was connected to the PICC line. Pt did not bathe, as she could not change clothing but did stand at the sink for 10 min to complete grooming with min cues. She tried to use hair conditioning spray as the soap.  She sat in room and worked on orientation questions. Some improvement today as she did identify her birthdate without cues and knew she was in a hospital [place]). She did state she had a stroke. And identified the month with 1 cue.  Pt ambulated to day room to engage in a card game.  Pt did attend well to task and was able to find matches to cards. Pt then walked back to room with very close S.  Pt sitting in chair in room and Recreation therapist arrived for her next session.  Therapy Documentation Precautions:  Precautions Precautions: Fall Precaution Comments: Impulsive Restrictions Weight Bearing Restrictions: No   Pain: Pain Assessment Pain Assessment: No/denies pain ADL: ADL Grooming: Minimal cueing Where Assessed-Grooming: Standing at sink Upper Body Bathing: Moderate cueing Where Assessed-Upper Body Bathing: Standing at sink Lower Body Bathing: Moderate cueing Where Assessed-Lower Body Bathing: Standing at sink Upper Body Dressing: Not assessed (gown for PICC line) Where Assessed-Upper Body Dressing: Wheelchair Lower Body Dressing: Minimal cueing Where Assessed-Lower Body Dressing: Standing at sink Toileting: Moderate cueing Where Assessed-Toileting:  Glass blower/designer: Close supervision Toilet Transfer Method: Ambulating  See FIM for current functional status  Therapy/Group: Individual Therapy  [place] 07/02/2014, 11:30 AM

## 2014-07-02 NOTE — Progress Notes (Signed)
Social Work Patient ID: Michelle Farrell, female   DOB: 04-Aug-1940, 74 y.o.   MRN: [id number] Spoke with [name]-granddaughter per telephone to inform of team conference goals-supervision level and discharge 4/12.  Informed pt's IV antibiotic should be done on Monday and MD feels she would be Ready on Tuesday for discharge.  [name] plans to talk with I & D MD's due to she is not comfortable with discharge so soon after completion of IV's, she reports: " The last time this happened she spiked a fever." She is aware of pt's needs and is working on arranging 24 hr supervision at discharge.

## 2014-07-03 ENCOUNTER — Inpatient Hospital Stay (HOSPITAL_COMMUNITY): Payer: Medicare Other

## 2014-07-03 ENCOUNTER — Inpatient Hospital Stay (HOSPITAL_COMMUNITY): Payer: Medicare Other | Admitting: Speech Pathology

## 2014-07-03 DIAGNOSIS — G009 Bacterial meningitis, unspecified: Secondary | ICD-10-CM

## 2014-07-03 LAB — GLUCOSE, CAPILLARY
GLUCOSE-CAPILLARY: 145 mg/dL — AB (ref 70–99)
GLUCOSE-CAPILLARY: 153 mg/dL — AB (ref 70–99)
Glucose-Capillary: 138 mg/dL — ABNORMAL HIGH (ref 70–99)

## 2014-07-03 MED ORDER — ALTEPLASE 2 MG IJ SOLR
2.0000 mg | Freq: Once | INTRAMUSCULAR | Status: DC
  Filled 2014-07-03 (×2): qty 2

## 2014-07-03 MED ORDER — ALTEPLASE 2 MG IJ SOLR
2.0000 mg | Freq: Once | INTRAMUSCULAR | Status: DC
  Filled 2014-07-03: qty 2

## 2014-07-03 MED ORDER — ALTEPLASE 2 MG IJ SOLR
2.0000 mg | Freq: Once | INTRAMUSCULAR | Status: AC
  Administered 2014-07-03: 2 mg
  Filled 2014-07-03: qty 2

## 2014-07-03 NOTE — Progress Notes (Signed)
Occupational Therapy Session Note  Patient Details  Name: Michelle Farrell MRN: [id number] Date of Birth: 02-Jul-1940  Today's Date: 07/03/2014 OT Individual Time: 0730-0830 OT Individual Time Calculation (min): 60 min    Short Term Goals: Week 1:  OT Short Term Goal 1 (Week 1): STGs = LTGs due to short LOS  Skilled Therapeutic Interventions/Progress Updates: ADL-retraining with focus on attention, awareness, sequencing, improved self-feeding, transfers, and dynamic sitting/standing.   Pt received asleep in bed and required max environmental stimulation to arouse and waken, approx 4 min during setup for planned ADL.   Pt minimally expressive initially but responsive to simple one-step commands appropriately.    Pt rose from supine to siting at edge of bed unassisted using bed rail and then reported need to toilet.   Pt required close supervision and redirection to attend to IV pole d/t impulsivity but ambulated to bathroom with overall steadying assist and close supervision for transfer technique.   Pt managed her clothing and completed toilet hygiene correctly and proceeded to shower level bathing after setup with shower barrier over PICC line at [place].   Pt required close supervision for bathing thoroughness d/t repetitive washing of left arm, chest and stomach but was able to reach all body parts except buttocks during this session d/t impaired standing balance when attempting to reach it.    Pt was escorted from shower to sink in w/c for safety and dressed at sink, standing to don pants but unable to don socks or shoes this session without assist.   Pt left in w/c at end of session with safety belt attached and call light within reach.     Therapy Documentation Precautions:  Precautions Precautions: Fall Precaution Comments: Impulsive Restrictions Weight Bearing Restrictions: No  Pain: Pain Assessment Pain Assessment: No/denies pain  See FIM for current functional status  Therapy/Group:  Individual Therapy  [place] 07/03/2014, 9:40 AM

## 2014-07-03 NOTE — Progress Notes (Signed)
Speech Language Pathology Daily Session Note  Patient Details  Name: Michelle Farrell MRN: [id number] Date of Birth: 08/12/1940  Today's Date: 07/03/2014 SLP Individual Time: 1505-1520 SLP Individual Time Calculation (min): 15 min  Short Term Goals: Week 1: SLP Short Term Goal 1 (Week 1): STG = LTG due to estimated length of stay   Skilled Therapeutic Interventions:  Pt was seen for skilled ST targeting cognitive goals.  Upon arrival, pt was asleep, but easily awakened to voice and light touch.  Pt required max to total assist to orient to place but was independently oriented to situation.  Pt required SLP made multiple attempts to get pt to get out of bed; however, pt repeatedly told SLP "I just want to rest."  SLP provided mod tactile and auditory stimulation to improve alertness to which pt responded by pulling her covers up.   Per report, pt had been sitting up in wheelchair at the nursing station all afternoon and had just recently eaten lunch.  As a result, pt missed 15 minutes of skilled ST.  Continue per current plan of care.    FIM:  Comprehension Comprehension Mode: Auditory Comprehension: 3-Understands basic 50 - 74% of the time/requires cueing 25 - 50%  of the time Expression Expression Mode: Verbal Expression: 2-Expresses basic 25 - 49% of the time/requires cueing 50 - 75% of the time. Uses single words/gestures. Social Interaction Social Interaction: 2-Interacts appropriately 25 - 49% of time - Needs frequent redirection. Problem Solving Problem Solving: 2-Solves basic 25 - 49% of the time - needs direction more than half the time to initiate, plan or complete simple activities Memory Memory: 2-Recognizes or recalls 25 - 49% of the time/requires cueing 51 - 75% of the time  Pain Pain Assessment Pain Assessment: No/denies pain  Therapy/Group: Individual Therapy  Bunny Lowdermilk, Selinda Orion 07/03/2014, 4:02 PM

## 2014-07-03 NOTE — Progress Notes (Signed)
tPA removed from red port of the PICC line with GBR and easily flushed.  However, now the purple port is completely occluded.  My recommendation would be to tPA both ports at the same time allowing the dwell time to be 2 hours.  Ed RN was notified of my recommendation, he will notify the IV team after the patient has received her dose of antibiotics and can be off of her IV fluids long enough for the tPA to dwell. [employer]

## 2014-07-03 NOTE — Progress Notes (Signed)
Occupational Therapy Note  Patient Details  Name: Michelle Farrell MRN: [id number] Date of Birth: Dec 18, 1940  Today's Date: 07/03/2014 OT Individual Time: 1000-1100 OT Individual Time Calculation (min): 60 min   Pt denied pain Individual Therapy  Pt resting in w/c upon arrival and agreeable to therapy.  Pt engaged in functional amb with SPC to gather items from varying surfaces.  Pt required min A and min multimodal cues for attention to task.  Pt required rest breaks during task and min multimodal cues for alertness when resting.  Pt engaged in sorting cards by color with max multimodal cues for attention to task.  Focus on activity tolerance, functional amb with SPC, attention to task, and safety awareness.  Pt remained in w/c with QRB in place, positioned at nursing station.   Leotis Shames [facility] 07/03/2014, 12:16 PM

## 2014-07-03 NOTE — Progress Notes (Signed)
Physical Therapy Session Note  Patient Details  Name: Michelle Farrell MRN: [id number] Date of Birth: 21-Feb-1941  Today's Date: 07/03/2014 PT Individual Time: 0900-1000 PT Individual Time Calculation (min): 60 min   Short Term Goals: Week 1:  PT Short Term Goal 1 (Week 1): = LTGs due to anticipated LOS     Skilled Therapeutic Interventions/Progress Updates:  Activity tolerance/neuro re-ed: Kinetron at resistance 60 in sitting, x 2 with max cues  Gait with SPC x 100' x 2 on level tile, and x 20' on carpet , with supervison> min assist as pt appeared to have antalgic gait with reluctance to bear wt L, and grunted and groaned throughout gait. Up/down 4 steps 2 rails with supervision/min assist.  Self selected step through method with noted difficulty stepping up onto L foot.   neuromuscular re-education via forced use, manual cues for standing on compliant mat, x 45 seconds focusing on = wt bearing  Pt continues to be quite confused, perseverating 1942 to questions regarding her name, location, etc.  When asked what she liked to [place] [employer] at home, she answered : "it was too small".  She needed mod assist to place a button front shirt on a hanger  Dual task standing on compliant mat, counting play money, x 1 minute.  Pt stated she needed to use toilet; had bladder urgency. Dual task transporting clothing items during ambulation with SPC over carpet, max cues for use of cane at same time.  Pt continually attempted hanging up cane in addition to clothes.  W/c> toilet using wall bar with close supervision.  NT arrived to assist pt .    Therapy Documentation Precautions:  Precautions Precautions: Fall Precaution Comments: Impulsive, confused Restrictions Weight Bearing Restrictions: No   Pain: Pain Assessment Pain Assessment: No/denies pain   Locomotion : Ambulation Ambulation/Gait Assistance: 4: Min guard;5: Supervision       See FIM for current functional status  Therapy/Group:  Individual Therapy  Shadiamond Koska 07/03/2014, 4:31 PM

## 2014-07-03 NOTE — Progress Notes (Signed)
Social Work Patient ID: Michelle Farrell, female   DOB: [birth year], 74 y.o.   MRN: [id number] MD would like to see what [facility] have to say before deciding on extension of discharge date.  Contacted [name] to inform of this.  She was contacted by [facility] to make a  Follow up appointment for her with them, she questioned if they have not come to see her and would like them too. Will work with her on discharge plans.  She is working on Getting someone to be with her, but also needs to work too.

## 2014-07-03 NOTE — Progress Notes (Addendum)
Recreational Therapy Assessment and Plan  Patient Details  Name: Michelle Farrell MRN: [id number] Date of Birth: Dec 02, 1940 Today's Date: 07/03/2014  Rehab Potential: Good ELOS: 7 days  Assessment Clinical Impression:  Problem List:  Patient Active Problem List   Diagnosis Date Noted  . 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 Altered mental status   . Blood poisoning   . Fall   . Sepsis secondary to UTI   . Meningitis   . CVA (cerebral infarction)   . Sepsis 06/19/2014  . Acute encephalopathy 06/19/2014  . UTI (lower urinary tract infection) 06/19/2014  . Tobacco abuse 06/19/2014  . Acute gastroenteritis 06/12/2014  . Crohn's disease 06/12/2014  . DM (diabetes mellitus) 06/12/2014  . Metabolic acidosis 71/24/5809  . Hypercalcemia 06/12/2014  . Oral candidiasis 06/12/2014  . Renal failure 05/16/2014  . AKI (acute kidney injury) 05/16/2014  . Dehydration 05/16/2014  . Pelvic mass 05/16/2014  . Hypertension 05/16/2014  . Essential hypertension 05/16/2014  . Diastolic CHF 98/33/8250  . Back pain, chronic 05/16/2014  . Accelerated hypertension 08/28/2013  . DKA (diabetic ketoacidoses) 08/27/2013  . OBSTRUCTIVE SLEEP APNEA 11/14/2008  . DYSPNEA 11/14/2008  . Diabetes mellitus 06/28/2007  . HYPERLIPIDEMIA 06/28/2007  . NEUROPATHY 06/28/2007  . CHF 06/28/2007  . ESOPHAGEAL STRICTURE 06/28/2007  . HIATAL HERNIA 06/28/2007  . Crohn disease 06/28/2007  . POLYARTHRITIS 06/28/2007  . ELEVATED BLOOD PRESSURE 06/28/2007  . ALLERGY 06/28/2007  . PUD, HX OF 06/28/2007    Past Medical History:  Past Medical History  Diagnosis Date  . CHF (congestive heart failure)   . Hypertension   . Back pain, chronic   . Hip pain, chronic   . GERD (gastroesophageal reflux disease)   . Crohn disease   . Diabetes mellitus without  complication   . Esophageal stricture     s/p dilation 2003, 2008  . PUD (peptic ulcer disease)   . CKD (chronic kidney disease)   . COPD (chronic obstructive pulmonary disease)    Past Surgical History:  Past Surgical History  Procedure Laterality Date  . Abdominal hysterectomy    . Tonsillectomy    . Cholecystectomy    . Bladder surgery      Assessment & Plan Clinical Impression: Michelle Farrell is a 74 y.o. female with DM, chronic pain, hypertension, Crohn's disease, recent discharged on 06/16/14 past admission for gastroenteritis with dehydration. She was readmitted on 06/19/14 past found unresponsive on the floor by her granddaughter. She was febrile with elevated lactate and UA with 3-6 WBCs and + nitrites. Family occasional twitching and patient with neck stiffness and Dr.Kirpatick recommended Keppra as well as LP due to concerns of meningitis or encephalitis. She was started on Vanc, ceftriaxone, ampicillin and acyclovir. Urine culture grew 80,000 colonies E coli but AMS persisted.  LP done then by IR on day 2 of hospitalization and noted 28 WBCs with 79% neutrophils, mildly elevated glucose and mildly elevated protein to 85 with some RBCs in sample. CSF and blood cultures have remained negative. HSV negative. 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 Carotid dopplers without significant ICA stenosis. 2D echo with EF 60-65% and grade 1 diastolic dysfunction. EEG with nonspecific slowing with periodic discharges from left hemisphere most often seen following strokes.   ID consulted for input and recommended d/c antibiotics and monitoring for recurrent fevers. Patient has had issues with lethargy and keppra d/c 3/27 per neurology input. Patient had recurrent fevers with lethargy 48 hours past stopping  empiric antibiotics and she was pan cultured with resumption of Vancomycin and  Ceftriaxone. Urine culture without growth and stools negative for cdiff. Blood cultures X 2 pending. Dr Tommy Medal recommended d/c antibiotics and repeat LP but family reluctant to deescalate care. He recommends two weeks of empiric course of high dose ceftriaxone plus/minus Vancomycin. Therapy ongoing and patient has difficulty following 1 step commands as well as confusion and inability to answer basic Y/N questions. Has safety awareness, perseverative behaviors as well as impaired mobility. CIR recommended by MD and Rehab team and patient cleared for admission today. Patient transferred to CIR on 06/27/2014.       Pt presents with decreased activity tolerance, decreased functional mobility, decreased balance, right inattention, decreased attention, decreased problem solving, decreased memory, & decreased awareness Limiting pt's independence with leisure/community pursuits.   Leisure History/Participation Premorbid leisure interest/current participation: [place] (poker)  Plan Rec Therapy Plan TR Treatment/Interventions: Research scientist (life sciences) instruction;1:1 session;Balance/vestibular training;Functional mobility training;Community reintegration;Cognitive remediation/compensation;Patient/family education;Therapeutic activities;Recreation/leisure participation;Therapeutic exercise;UE/LE Coordination activities;Visual/perceptual remediation/compensation  Recommendations for other services: None  Discharge Criteria: Patient will be discharged from TR if patient refuses treatment 3 consecutive times without medical reason.  If treatment goals not met, if there is a change in medical status, if patient makes no progress towards goals or if patient is discharged from hospital.  The above assessment, treatment plan, treatment alternatives and goals were discussed and mutually agreed upon: by patient    Pt participated in table top card game using BUE's with supervision.  Pt sustained attention to  task and completed problem solving ~5 minutes without cues.  After 5 minutes, pt required mod-max cues for attention & problem solving during card game.  [place] 07/03/2014, 8:55 AM

## 2014-07-03 NOTE — Progress Notes (Addendum)
74 y.o. female with DM, chronic pain, hypertension, Crohn's disease, recent discharged on 06/16/14 past admission for gastroenteritis with dehydration. She was readmitted on 06/19/14 past found unresponsive on the floor by her granddaughter. She was febrile with elevated lactate and UA with 3-6 WBCs and + nitrites. Family occasional twitching and patient with neck stiffness and Dr.Kirpatick recommended Keppra as well as LP due to concerns of meningitis or encephalitis. She was started on Vanc, ceftriaxone, ampicillin and acyclovir. Urine culture grew 80,000 colonies E coli but AMS persisted.  LP done then by IR on day 2 of hospitalization and noted 28 WBCs with 79% neutrophils, mildly elevated glucose and mildly elevated protein to 85 with some RBCs in sample. CSF and blood cultures have remained negative. HSV negative. MRI brain showed acute/subacute 10 mm nonhemorrhagic infarct within the posterior medial left thalamus Subjective/Complaints: No new issues ROS limited by cognition  Objective: Vital Signs: Blood pressure 144/54, pulse 65, temperature 98.5 F (36.9 C), temperature source Oral, resp. rate 17, weight 72.5 kg (159 lb 13.3 oz), SpO2 97 %. No results found. Results for orders placed or performed during the hospital encounter of 06/27/14 (from the past 72 hour(s))  Glucose, capillary     Status: Abnormal   Collection Time: 06/30/14 11:30 AM  Result Value Ref Range   Glucose-Capillary 136 (H) 70 - 99 mg/dL  Glucose, capillary     Status: Abnormal   Collection Time: 06/30/14  4:35 PM  Result Value Ref Range   Glucose-Capillary 128 (H) 70 - 99 mg/dL  Glucose, capillary     Status: Abnormal   Collection Time: 06/30/14  8:37 PM  Result Value Ref Range   Glucose-Capillary 172 (H) 70 - 99 mg/dL  Glucose, capillary     Status: Abnormal   Collection Time: 07/01/14  6:44 AM  Result Value Ref Range   Glucose-Capillary 164 (H) 70 - 99 mg/dL   Comment 1 Notify RN   Glucose,  capillary     Status: None   Collection Time: 07/01/14 11:57 AM  Result Value Ref Range   Glucose-Capillary 94 70 - 99 mg/dL  Glucose, capillary     Status: Abnormal   Collection Time: 07/01/14  4:28 PM  Result Value Ref Range   Glucose-Capillary 116 (H) 70 - 99 mg/dL  Glucose, capillary     Status: Abnormal   Collection Time: 07/01/14  9:45 PM  Result Value Ref Range   Glucose-Capillary 151 (H) 70 - 99 mg/dL  Glucose, capillary     Status: Abnormal   Collection Time: 07/02/14  7:50 AM  Result Value Ref Range   Glucose-Capillary 124 (H) 70 - 99 mg/dL   Comment 1 Notify RN   Glucose, capillary     Status: Abnormal   Collection Time: 07/02/14 11:52 AM  Result Value Ref Range   Glucose-Capillary 162 (H) 70 - 99 mg/dL   Comment 1 Notify RN   Glucose, capillary     Status: Abnormal   Collection Time: 07/02/14  4:51 PM  Result Value Ref Range   Glucose-Capillary 123 (H) 70 - 99 mg/dL   Comment 1 Notify RN   Glucose, capillary     Status: Abnormal   Collection Time: 07/02/14  9:01 PM  Result Value Ref Range   Glucose-Capillary 203 (H) 70 - 99 mg/dL   Comment 1 Notify RN   Glucose, capillary     Status: Abnormal   Collection Time: 07/03/14  6:27 AM  Result Value Ref Range  Glucose-Capillary 138 (H) 70 - 99 mg/dL   Comment 1 Notify RN      HEENT: edentulous Cardio: RRR and no murmur Resp: CTA B/L and unlabored GI: BS positive and NT,ND Extremity:  Pulses positive and No Edema Skin:   Intact Neuro: Confused, Flat, Abnormal Sensory difficult to assess due to cognition, Abnormal Motor 4/5 in BUE and BLE and Abnormal FMC Ataxic/ dec FMC Musc/Skel:  Other no pain with BUE adn BLE ROM Gen NAD Perseveration  Assessment/Plan: 1. Functional deficits secondary to  Left medial thalamic infarct thrombosis SVDwhich require 3+ hours per day of interdisciplinary therapy in a comprehensive inpatient rehab setting. Physiatrist is providing close team supervision and 24 hour management of  active medical problems listed below. Physiatrist and rehab team continue to assess barriers to discharge/monitor patient progress toward functional and medical goals.  FIM: FIM - Bathing Bathing Steps Patient Completed: Chest, Right Arm, Left Arm, Abdomen, Front perineal area, Buttocks, Right upper leg, Left upper leg, Right lower leg (including foot), Left lower leg (including foot) Bathing: 5: Supervision: Safety issues/verbal cues  FIM - Upper Body Dressing/Undressing Upper body dressing/undressing: 0: Wears gown/pajamas-no public clothing FIM - Lower Body Dressing/Undressing Lower body dressing/undressing steps patient completed: Thread/unthread right underwear leg, Thread/unthread left underwear leg, Pull underwear up/down, Thread/unthread right pants leg, Thread/unthread left pants leg, Pull pants up/down, Don/Doff right sock, Don/Doff left sock, Don/Doff right shoe, Don/Doff left shoe, Fasten/unfasten right shoe, Fasten/unfasten left shoe Lower body dressing/undressing: 5: Supervision: Safety issues/verbal cues  FIM - Toileting Toileting steps completed by patient: Adjust clothing prior to toileting, Performs perineal hygiene, Adjust clothing after toileting Toileting Assistive Devices: Grab bar or rail for support Toileting: 5: Supervision: Safety issues/verbal cues  FIM - Air cabin crew Transfers: 4-To toilet/BSC: Min A (steadying Pt. > 75%), 4-From toilet/BSC: Min A (steadying Pt. > 75%)  FIM - Control and instrumentation engineer Devices: Cane, Arm rests Bed/Chair Transfer: 5: Bed > Chair or W/C: Supervision (verbal cues/safety issues), 5: Chair or W/C > Bed: Supervision (verbal cues/safety issues)  FIM - Locomotion: Wheelchair Distance: 0', pt still unable to follow commands to sequence w/c mobility at this time Locomotion: Wheelchair: 1: Total Assistance/staff pushes wheelchair (Pt<25%) FIM - Locomotion: Ambulation Locomotion: Ambulation Assistive  Devices: Journalist, newspaper Ambulation/Gait Assistance: 4: Min guard, 5: Supervision Locomotion: Ambulation: 4: Travels 150 ft or more with minimal assistance (Pt.>75%)  Comprehension Comprehension Mode: Auditory Comprehension: 2-Understands basic 25 - 49% of the time/requires cueing 51 - 75% of the time  Expression Expression Mode: Verbal Expression: 2-Expresses basic 25 - 49% of the time/requires cueing 50 - 75% of the time. Uses single words/gestures.  Social Interaction Social Interaction: 2-Interacts appropriately 25 - 49% of time - Needs frequent redirection.  Problem Solving Problem Solving Mode: Not assessed Problem Solving: 1-Solves basic less than 25% of the time - needs direction nearly all the time or does not effectively solve problems and may need a restraint for safety  Memory Memory Mode: Not assessed Memory: 1-Recognizes or recalls less than 25% of the time/requires cueing greater than 75% of the time  Medical Problem List and Plan: 1. Functional deficits secondary toleft medial thalamic infarct 2. DVT Prophylaxis/Anticoagulation: Pharmaceutical: Lovenox 3. Pain Management: tylenol prn for pain 4. Mood: LCSW to follow for evaluation and support as mentation improves. 5. Neuropsych: This patient is not capable of making decisions on her own behalf. 6. Skin/Wound Care: Routine pressure relief measures.  7. Fluids/Electrolytes/Nutrition: Monitor I/O. Check lytes in  am saturday  8. HTN: monitor BP every 8 hours. Continue lisinopril, aldactone and catapres. 9. Fevers: Continue high dose ceftriaxone for two weeks for presumed meningitis.  -also with recent UTI however last ucx neg, blood culture pending  10. ABLA?:stable H and H 8.6  .  Monitor for signs of bleeding.  11. Crohn's disease: Off azulfidine at this time. No complaints of abdominal distress/diarrhea 12. DM type 2: Monitor BS with achs checks. Off Januvia due to poor intake. Will use SSI for now and  resume oral meds as indicated 13. CKD: worsening once again, question whether this is due to ACE inhibitor, vancomycin or poor intake. Will ask internal medicine consultation, given multiple medical issues. Do not think she needs to be transferred at this point.  Spoke to granddaughter today, discussed elevation of creatinine, discussed need for IV hydration. We also discussed concerns about by mouth intake, discussed that trying to avoid PEG placement   LOS (Days) 6 A FACE TO FACE EVALUATION WAS PERFORMED  KIRSTEINS,ANDREW E 07/03/2014, 8:52 AM

## 2014-07-03 NOTE — Progress Notes (Signed)
Social Work Patient ID: Michelle Farrell, female   DOB: 06-Apr-1940, 74 y.o.   MRN: [id number] Spoke with [name]-granddaughter who has several concerns regarding the discharge on Tuesday.  She has medical concerns and has not hired assistance for pt yet. She is in the process on applying for the [place] aide and attendant plan.  She wants the discharge delayed atleast until Thursday or Friday of next week. Will ask MD and talk with team and get back With [name].

## 2014-07-04 ENCOUNTER — Inpatient Hospital Stay (HOSPITAL_COMMUNITY): Payer: Medicare Other | Admitting: Occupational Therapy

## 2014-07-04 ENCOUNTER — Inpatient Hospital Stay (HOSPITAL_COMMUNITY): Payer: Medicare Other | Admitting: *Deleted

## 2014-07-04 ENCOUNTER — Inpatient Hospital Stay (HOSPITAL_COMMUNITY): Payer: Medicare Other | Admitting: Speech Pathology

## 2014-07-04 DIAGNOSIS — R001 Bradycardia, unspecified: Secondary | ICD-10-CM | POA: Diagnosis present

## 2014-07-04 DIAGNOSIS — D649 Anemia, unspecified: Secondary | ICD-10-CM | POA: Diagnosis present

## 2014-07-04 DIAGNOSIS — N179 Acute kidney failure, unspecified: Secondary | ICD-10-CM | POA: Diagnosis not present

## 2014-07-04 DIAGNOSIS — E876 Hypokalemia: Secondary | ICD-10-CM | POA: Diagnosis not present

## 2014-07-04 LAB — BASIC METABOLIC PANEL WITH GFR
Anion gap: 8 (ref 5–15)
BUN: 7 mg/dL (ref 6–23)
CO2: 23 mmol/L (ref 19–32)
Calcium: 9.8 mg/dL (ref 8.4–10.5)
Chloride: 111 mmol/L (ref 96–112)
Creatinine, Ser: 2.49 mg/dL — ABNORMAL HIGH (ref 0.50–1.10)
GFR calc Af Amer: 21 mL/min — ABNORMAL LOW
GFR calc non Af Amer: 18 mL/min — ABNORMAL LOW
Glucose, Bld: 192 mg/dL — ABNORMAL HIGH (ref 70–99)
Potassium: 3.6 mmol/L (ref 3.5–5.1)
Sodium: 142 mmol/L (ref 135–145)

## 2014-07-04 LAB — GLUCOSE, CAPILLARY
Glucose-Capillary: 132 mg/dL — ABNORMAL HIGH (ref 70–99)
Glucose-Capillary: 142 mg/dL — ABNORMAL HIGH (ref 70–99)
Glucose-Capillary: 145 mg/dL — ABNORMAL HIGH (ref 70–99)

## 2014-07-04 LAB — CBC
HEMATOCRIT: 27.7 % — AB (ref 36.0–46.0)
Hemoglobin: 9.4 g/dL — ABNORMAL LOW (ref 12.0–15.0)
MCH: 30.2 pg (ref 26.0–34.0)
MCHC: 33.9 g/dL (ref 30.0–36.0)
MCV: 89.1 fL (ref 78.0–100.0)
Platelets: 230 10*3/uL (ref 150–400)
RBC: 3.11 MIL/uL — ABNORMAL LOW (ref 3.87–5.11)
RDW: 15.8 % — AB (ref 11.5–15.5)
WBC: 8 10*3/uL (ref 4.0–10.5)

## 2014-07-04 LAB — VANCOMYCIN, TROUGH: Vancomycin Tr: 47.8 ug/mL (ref 10.0–20.0)

## 2014-07-04 LAB — MAGNESIUM: Magnesium: 1.6 mg/dL (ref 1.5–2.5)

## 2014-07-04 MED ORDER — INSULIN GLARGINE 100 UNIT/ML ~~LOC~~ SOLN
5.0000 [IU] | Freq: Every day | SUBCUTANEOUS | Status: DC
  Administered 2014-07-04 – 2014-07-06 (×3): 5 [IU] via SUBCUTANEOUS
  Filled 2014-07-04 (×4): qty 0.05

## 2014-07-04 MED ORDER — HYDRALAZINE HCL 20 MG/ML IJ SOLN
10.0000 mg | Freq: Four times a day (QID) | INTRAMUSCULAR | Status: DC | PRN
  Filled 2014-07-04: qty 0.5

## 2014-07-04 MED ORDER — HYDRALAZINE HCL 25 MG PO TABS
25.0000 mg | ORAL_TABLET | Freq: Three times a day (TID) | ORAL | Status: DC
  Administered 2014-07-04 – 2014-07-06 (×5): 25 mg via ORAL
  Filled 2014-07-04 (×8): qty 1

## 2014-07-04 MED ORDER — ENOXAPARIN SODIUM 30 MG/0.3ML ~~LOC~~ SOLN
30.0000 mg | SUBCUTANEOUS | Status: DC
  Administered 2014-07-04 – 2014-07-06 (×3): 30 mg via SUBCUTANEOUS
  Filled 2014-07-04 (×5): qty 0.3

## 2014-07-04 MED ORDER — SODIUM CHLORIDE 0.45 % IV SOLN
INTRAVENOUS | Status: DC
  Administered 2014-07-04 – 2014-07-06 (×3): via INTRAVENOUS

## 2014-07-04 MED ORDER — POTASSIUM CHLORIDE IN NACL 20-0.45 MEQ/L-% IV SOLN
INTRAVENOUS | Status: DC
  Filled 2014-07-04: qty 1000

## 2014-07-04 MED ORDER — MAGNESIUM OXIDE 400 (241.3 MG) MG PO TABS
400.0000 mg | ORAL_TABLET | Freq: Two times a day (BID) | ORAL | Status: DC
  Administered 2014-07-04 – 2014-07-06 (×4): 400 mg via ORAL
  Filled 2014-07-04 (×8): qty 1

## 2014-07-04 MED ORDER — MEGESTROL ACETATE 400 MG/10ML PO SUSP
400.0000 mg | Freq: Two times a day (BID) | ORAL | Status: DC
  Administered 2014-07-04 – 2014-07-06 (×4): 400 mg via ORAL
  Filled 2014-07-04 (×8): qty 10

## 2014-07-04 NOTE — Consult Note (Signed)
[facility] Medical Consultation  Michelle Farrell BDZ:[id number] DOB: 07/29/1940 DOA: 06/27/2014 PCP: Hermelinda Medicus, MD   Requesting physician: Reesa Chew, PA-C Date of consultation: 07/04/2014 Reason for consultation: general medical management  Impression/Recommendations Principal Problem:   Thrombotic cerebral infarction Active Problems:   Sepsis   Acute encephalopathy   Acute renal failure   Essential hypertension   Diastolic CHF   CVA (cerebral infarction)   Bacterial meningitis   Bradycardia   Hypokalemia   Anemia    Acute renal failure  Baseline was 0.89. Creatinine currently 2.49 Multifactorial. Likely a combination of vancomycin and dehydration. Lisinopril and Vancomycin were stopped today.  Will hold spironolactone as well.  Giving gentle IV fluids. Check C met in a.m.  Bradycardia No history of bradycardia. Not on beta blockers or calcium channel blockers. Per granddaughter patient is asymptomatic. Will check orthostatics. We'll check an EKG and TSH.  If bradycardia is Persistent or becomes symptomatic would recommend a cardiology consultation.  Hypertension Was being managed on spironolactone and lisinopril.  Will stop these and start hydralazine TID on 4/9.  The patient was on hydralazine prior to admission.  May need to add back diuretic and lisinopril after her ARF resolves.  Hypokalemia Potassium had dropped to 2.9. This was repleted and she was placed on spironolactone. Magnesium was checked 1.6. Potassium is currently stable. Will give magnesium oral supplementation x 5 days.  Monitor C met in a.m.  Hyperglycemia Patient has been on sliding scale insulin. Will add low-dose Lantus.  Normocytic anemia Hemoglobin baseline was approximately 12. Dropped to 8.1. He is now at 9.4. No frank bleeding or bruising. This could be due to vanc as well.  Will check a guiac and continue to monitor HGb periodically.  Diastolic heart failure Patient is  currently dry.  No signs of decompensation.  Stable. Monitor volume status now that spironolactone is discontinued.  Thrombotic cerebral infarction On Aspirin 325 mg and daily lovenox.  I'm uncertain why she is on lovenox. Appears to be recovering very well.  Management Per Stroke and Rehab team.  Recent UTI. Has been adequately treated with antibiotics.  Suspicion for meningitis Evaluated by Infectious Disease.  Is finishing a 2 week course of Rocephin which will end on 4/11.  Vancomycin had to be discontinued due to ARF.  Decreased appetite. Patient is on Megace.   TRH will followup again tomorrow. Please contact me if I can be of assistance in the meanwhile. Thank you for this consultation.  Chief Complaint: Acute renal failure  HPI:  Michelle Farrell is a 74 y.o. female, with a pmh of heart failure (unspecified), diabetes mellitus, chronic hip and back and leg pain, hypertension, and well-controlled Crohn's disease.  She had admissions in February and March of this year for gastroenteritis symptoms (n/v/d).  She returned to the hospital on 06/19/14 with fever, altered mental status, and severe sepsis.  There was concern for meningitis.  She was found to have a UTI and unfortunately a CVA.  She was evaluated by neurology and infectious disease, stabilized and was transferred to [place] on 06/27/2014.  Infectious Disease continued to follow her in CIR.  My understanding is that she has progressed well at CIR.  However TRH was consulted today 4/8 primarily for acute renal failure.      Michelle Farrell is now speaking well and following all commands.  She denies any pain, but states she is eating very little.  Her grand-daughter Michelle Farrell expressed concerns about her potassium level (it was  previously low) so a bmet was checked today and she was found to be in ARF.  Michelle Farrell had been on vancomycin for treatment of potential meningitis.  She was also on lisinopril and spironolactone.  Further  she is noted to be bradycardic but she denies and chest pain, dizziness, headache, or changes in vision.  Per her grand daughter, the patient has no previous history of bradycardia.  Review of Systems:  Last BM was this morning.  No diarrhea, no constipation.  No burning or frequency with urination.  No abdominal pain or URI symptoms.  She plans to return to her home when she is released from CIR. As above. Otherwise 10 point ROS was reviewed and found to be negative.   Past Medical History  Diagnosis Date  . CHF (congestive heart failure)   . Hypertension   . Back pain, chronic   . Hip pain, chronic   . GERD (gastroesophageal reflux disease)   . Crohn disease   . Diabetes mellitus without complication   . Esophageal stricture     s/p dilation 2003, 2008  . PUD (peptic ulcer disease)   . CKD (chronic kidney disease)   . COPD (chronic obstructive pulmonary disease)    Past Surgical History  Procedure Laterality Date  . Abdominal hysterectomy    . Tonsillectomy    . Cholecystectomy    . Bladder surgery     Social History:  reports that she has been smoking.  She has never used smokeless tobacco. She reports that she does not drink alcohol or use illicit drugs.  No Known Allergies Family History  Problem Relation Age of Onset  . Diabetes Mellitus II Other     Prior to Admission medications   Medication Sig Start Date End Date Taking? Authorizing Provider  aspirin EC 325 MG EC tablet Take 1 tablet (325 mg total) by mouth daily. 06/27/14   Donne Hazel, MD  bisacodyl (DULCOLAX) 10 MG suppository Place 1 suppository (10 mg total) rectally daily as needed for moderate constipation. 06/27/14   Donne Hazel, MD  cefTRIAXone (ROCEPHIN) 40 MG/ML IVPB Inject 50 mLs (2 g total) into the vein every 12 (twelve) hours. 06/27/14   Donne Hazel, MD  Cholecalciferol (VITAMIN D) 2000 UNITS CAPS Take 1 capsule by mouth daily.    Historical Provider, MD  cloNIDine (CATAPRES) 0.1 MG tablet Take 1  tablet (0.1 mg total) by mouth 2 (two) times daily. 08/31/13   Maryann Mikhail, DO  famotidine (PEPCID) 20 MG tablet Take 1 tablet (20 mg total) by mouth at bedtime. 06/27/14   Donne Hazel, MD  folic acid (FOLVITE) 1 MG tablet Take 1 mg by mouth daily.    Historical Provider, MD  gabapentin (NEURONTIN) 600 MG tablet Take 600 mg by mouth 3 (three) times daily.     Historical Provider, MD  hydrALAZINE (APRESOLINE) 50 MG tablet Take 1 tablet (50 mg total) by mouth every 8 (eight) hours. 08/31/13   Maryann Mikhail, DO  lisinopril (PRINIVIL,ZESTRIL) 10 MG tablet Take 1 tablet (10 mg total) by mouth daily. 06/16/14   Velvet Bathe, MD  omeprazole (PRILOSEC) 20 MG capsule Take 20 mg by mouth every morning.     Historical Provider, MD  ondansetron (ZOFRAN) 4 MG tablet Take 1 tablet (4 mg total) by mouth every 6 (six) hours as needed for nausea. 06/16/14   Velvet Bathe, MD  potassium chloride SA (K-DUR,KLOR-CON) 20 MEQ tablet Take 20 mEq by mouth daily.  Historical Provider, MD  PROAIR HFA 108 (90 BASE) MCG/ACT inhaler Inhale 1 puff into the lungs every 6 (six) hours as needed. Shortness of breath or wheezing 03/03/14   Historical Provider, MD  simvastatin (ZOCOR) 40 MG tablet Take 40 mg by mouth at bedtime.      Historical Provider, MD  sitaGLIPtin (JANUVIA) 100 MG tablet Take 50 mg by mouth every morning.     Historical Provider, MD  spironolactone (ALDACTONE) 25 MG tablet Take 25 mg by mouth every morning.     Historical Provider, MD  sulfaSALAzine (AZULFIDINE) 500 MG tablet Take 500 mg by mouth 4 (four) times daily.    Historical Provider, MD  Vancomycin (VANCOCIN) 750 MG/150ML SOLN Inject 150 mLs (750 mg total) into the vein every 12 (twelve) hours. 06/27/14   Donne Hazel, MD   Physical Exam: Blood pressure 134/58, pulse 47, temperature 97.7 F (36.5 C), temperature source Oral, resp. rate 17, weight 72.5 kg (159 lb 13.3 oz), SpO2 100 %. Filed Vitals:   07/04/14 1430  BP: 134/58  Pulse: 47  Temp: 97.7  F (36.5 C)  Resp: 17    Labs on Admission:  Basic Metabolic Panel:  Recent Labs Lab 06/28/14 0430 07/04/14 0920 07/04/14 1555  NA 138 142  --   K 3.7 3.6  --   CL 113* 111  --   CO2 22 23  --   GLUCOSE 121* 192*  --   BUN <5* 7  --   CREATININE 0.85 2.49*  --   CALCIUM 9.2 9.8  --   MG  --   --  1.6   Liver Function Tests:  Recent Labs Lab 06/28/14 0430  AST 21  ALT 28  ALKPHOS 46  BILITOT 0.4  PROT 5.2*  ALBUMIN 2.8*   CBC:  Recent Labs Lab 06/28/14 0430 07/04/14 0920  WBC 5.7 8.0  NEUTROABS 2.3  --   HGB 8.6* 9.4*  HCT 24.9* 27.7*  MCV 86.8 89.1  PLT 176 230   CBG:  Recent Labs Lab 07/03/14 0627 07/03/14 1646 07/03/14 2157 07/04/14 0643 07/04/14 1638  GLUCAP 138* 145* 153* 132* 142*      EKG: pending  Time spent: 60 min  Karen Kitchens [facility] Pager [phone]  If 7PM-7AM, please contact night-coverage [email or web address] Password [facility] 07/04/2014, 6:32 PM

## 2014-07-04 NOTE — Progress Notes (Signed)
Speech Language Pathology Daily Session Note  Patient Details  Name: Michelle Farrell MRN: [id number] Date of Birth: 08/26/1940  Today's Date: 07/04/2014 SLP Individual Time: 1400-1510 SLP Individual Time Calculation (min): 70 min  Short Term Goals: Week 1: SLP Short Term Goal 1 (Week 1): STG = LTG due to estimated length of stay   Skilled Therapeutic Interventions:  Pt was seen for skilled ST targeting goals for dysphagia and cognition.  Upon arrival, pt was asleep in bed but was awakened to voice and light touch.  Pt was agreeable to participate in therapy with mod verbal and tactile cues for encouragement.  Pt was transferred out of bed to wheelchair to maximize attention and alertness during structured therapeutic tasks.  Pt consumed dys 2 textures and thin liquids with supervision cues/assist for tray set up.  While pt continues to demonstrate prolonged mastication of solids, she exhibits complete clearance of residual solids from the oral cavity post swallow.  No overt s/s of aspiration were noted despite large, consecutive cup sips of thin liquids.  Recommend that pt continue on dys 2 textures and thin liquids with full supervision for cognition.  Upon completion of meal, SLP facilitated the session with a basic kitchen task targeting familiar problem solving and sustained attention.  Pt located targeted items with mod verbal and visual cues to use written aids/labels present in the kitchen.  Furthermore, pt sustained her attention to a basic cleaning task for ~3 minute intervals before requiring either min verbal, visual or tactile cues for redirection or a seated rest break.  Pt initiated a request to use the restroom and waited appropriately for SLP to assist her to the bathroom which indicates improved safety awareness from initial eval.  Pt returned demonstration of how to apply wheelchair brakes prior to standing up to transfer back to bed upon return to room.  Pt left in bed with all needs  within reach, bed alarm reset.  Continue per current plan of care.    FIM:  Comprehension Comprehension Mode: Auditory Comprehension: 3-Understands basic 50 - 74% of the time/requires cueing 25 - 50%  of the time Expression Expression Mode: Verbal Expression: 2-Expresses basic 25 - 49% of the time/requires cueing 50 - 75% of the time. Uses single words/gestures. Social Interaction Social Interaction: 2-Interacts appropriately 25 - 49% of time - Needs frequent redirection. Problem Solving Problem Solving: 2-Solves basic 25 - 49% of the time - needs direction more than half the time to initiate, plan or complete simple activities Memory Memory: 2-Recognizes or recalls 25 - 49% of the time/requires cueing 51 - 75% of the time  Pain Pain Assessment Pain Assessment: No/denies pain  Therapy/Group: Individual Therapy  Daniya Aramburo, Selinda Orion 07/04/2014, 3:38 PM

## 2014-07-04 NOTE — Progress Notes (Addendum)
ANTIBIOTIC CONSULT NOTE - FOLLOW UP  Pharmacy Consult for vancomycin Indication: meningitis  No Known Allergies  Patient Measurements: Weight: 159 lb 13.3 oz (72.5 kg) Adjusted Body Weight:   Vital Signs: Temp: 99.4 F (37.4 C) (04/08 0544) Temp Source: Oral (04/08 0544) BP: 130/42 mmHg (04/08 0544) Pulse Rate: 95 (04/08 0544) Intake/Output from previous day: 04/07 0701 - 04/08 0700 In: 240 [P.O.:240] Out: -  Intake/Output from this shift:    Labs: No results for input(s): WBC, HGB, PLT, LABCREA, CREATININE in the last 72 hours. Estimated Creatinine Clearance: 61 mL/min (by C-G formula based on Cr of 0.85). No results for input(s): VANCOTROUGH, VANCOPEAK, VANCORANDOM, GENTTROUGH, GENTPEAK, GENTRANDOM, TOBRATROUGH, TOBRAPEAK, TOBRARND, AMIKACINPEAK, AMIKACINTROU, AMIKACIN in the last 72 hours.   Microbiology: Recent Results (from the past 720 hour(s))  Blood Culture (routine x 2)     Status: None   Collection Time: 06/19/14 12:23 PM  Result Value Ref Range Status   Specimen Description BLOOD LEFT ANTECUBITAL  Final   Special Requests BOTTLES DRAWN AEROBIC AND ANAEROBIC 3CC  Final   Culture   Final    NO GROWTH 5 DAYS Performed at [facility]    Report Status 06/25/2014 FINAL  Final  Blood Culture (routine x 2)     Status: None   Collection Time: 06/19/14 12:36 PM  Result Value Ref Range Status   Specimen Description BLOOD LEFT ANTECUBITAL  Final   Special Requests BOTTLES DRAWN AEROBIC AND ANAEROBIC 5ML  Final   Culture   Final    NO GROWTH 5 DAYS Performed at [facility]    Report Status 06/25/2014 FINAL  Final  Urine culture     Status: None   Collection Time: 06/19/14  2:32 PM  Result Value Ref Range Status   Specimen Description URINE, CLEAN CATCH  Final   Special Requests NONE  Final   Colony Count   Final    80,000 COLONIES/ML Performed at [facility]    Culture   Final    ESCHERICHIA COLI Performed at [facility]    Report Status 06/21/2014 FINAL  Final   Organism ID, Bacteria ESCHERICHIA COLI  Final      Susceptibility   Escherichia coli - MIC*    AMPICILLIN >=32 RESISTANT Resistant     CEFAZOLIN >=64 RESISTANT Resistant     CEFTRIAXONE <=1 SENSITIVE Sensitive     CIPROFLOXACIN >=4 RESISTANT Resistant     GENTAMICIN <=1 SENSITIVE Sensitive     LEVOFLOXACIN >=8 RESISTANT Resistant     NITROFURANTOIN <=16 SENSITIVE Sensitive     TOBRAMYCIN <=1 SENSITIVE Sensitive     TRIMETH/SULFA >=320 RESISTANT Resistant     PIP/TAZO >=128 RESISTANT Resistant     * ESCHERICHIA COLI  MRSA PCR Screening     Status: None   Collection Time: 06/19/14  8:30 PM  Result Value Ref Range Status   MRSA by PCR NEGATIVE NEGATIVE Final    Comment:        The GeneXpert MRSA Assay (FDA approved for NASAL specimens only), is one component of a comprehensive MRSA colonization surveillance program. It is not intended to diagnose MRSA infection nor to guide or monitor treatment for MRSA infections.   CSF culture     Status: None   Collection Time: 06/20/14 11:33 AM  Result Value Ref Range Status   Specimen Description CSF  Final   Special Requests NONE  Final   Gram Stain   Final  FEW WBC PRESENT, PREDOMINANTLY PMN NO ORGANISMS SEEN Gram Stain Report Called to,Read Back By and Verified With: Gram Stain Report Called to,Read Back By and Verified With: ALLISON DUNDON [id number]@1 :00AM BJENN Performed at [facility]   Final    NO GROWTH 3 DAYS Performed at [facility]    Report Status 06/24/2014 FINAL  Final  Gram stain     Status: None   Collection Time: 06/20/14 11:33 AM  Result Value Ref Range Status   Specimen Description CSF  Final   Special Requests NONE  Final   Gram Stain   Final    CYTOSPIN PREP WBC PRESENT, PREDOMINANTLY PMN NO ORGANISMS SEEN    Report Status 06/20/2014 FINAL  Final  Culture, blood (routine x 2)     Status: None   Collection Time: 06/24/14   6:44 PM  Result Value Ref Range Status   Specimen Description BLOOD LEFT HAND  Final   Special Requests BOTTLES DRAWN AEROBIC ONLY 3CC  Final   Culture   Final    NO GROWTH 5 DAYS Performed at [facility]    Report Status 07/01/2014 FINAL  Final  Culture, blood (routine x 2)     Status: None   Collection Time: 06/24/14  6:53 PM  Result Value Ref Range Status   Specimen Description BLOOD LEFT HAND  Final   Special Requests BOTTLES DRAWN AEROBIC AND ANAEROBIC 5CC  Final   Culture   Final    NO GROWTH 5 DAYS Performed at [facility]    Report Status 07/01/2014 FINAL  Final  Culture, Urine     Status: None   Collection Time: 06/24/14  7:50 PM  Result Value Ref Range Status   Specimen Description URINE, RANDOM  Final   Special Requests Normal  Final   Colony Count NO GROWTH Performed at [facility]   Final   Culture NO GROWTH Performed at [facility]   Final   Report Status 06/26/2014 FINAL  Final  Clostridium Difficile by PCR     Status: None   Collection Time: 06/26/14  8:19 PM  Result Value Ref Range Status   C difficile by pcr NEGATIVE NEGATIVE Final    Anti-infectives    Start     Dose/Rate Route Frequency Ordered Stop   06/27/14 1700  cefTRIAXone (ROCEPHIN) 2 g in dextrose 5 % 50 mL IVPB - Premix     2 g 100 mL/hr over 30 Minutes Intravenous Every 12 hours 06/27/14 1601     06/27/14 1700  vancomycin (VANCOCIN) IVPB 750 mg/150 ml premix     750 mg 150 mL/hr over 60 Minutes Intravenous Every 12 hours 06/27/14 1601        Assessment: 74 yo female with probably meningitis is currently on day 11 of vancomycin.  SCr up to 2.49 (CrCl ~21); urinated 3x.  Vancomycin was off schedule. Vancomycin level was supratherapeutic @ 47.8  Goal of Therapy:  Vancomycin trough level 15-20 mcg/ml  Plan:  Hold vancomycin for now.  Will redraw vancomycin random level Sunday (not ordered yet) instead of tomorrow to reassess dosing since I doubt that  patient's level will be <20 tomorrow.    Devan Danzer, Tsz-Yin 07/04/2014,8:30 AM

## 2014-07-04 NOTE — Progress Notes (Signed)
Physical Therapy Session Note  Patient Details  Name: Michelle Farrell MRN: [id number] Date of Birth: 03-02-1941  Today's Date: 07/04/2014 PT Individual Time: 1110-1200 PT Individual Time Calculation (min): 50 min   Short Term Goals: Week 1:  PT Short Term Goal 1 (Week 1): = LTGs due to anticipated LOS  Skilled Therapeutic Interventions/Progress Updates:  Tx focused on cognitive remediation, balance training, activity tolerance, and gait with RW. Pt had R LOB during gait with SPC, needing Mod A to recover. Pt seems to demonstrate decreased R-sided attention to environment and body today. Max encouragement needed for OOB activity.   Cognitive remediation focused on orientation, attention to functional tasks, basic problem solving, and STM throughout tx. Pt oriented to self, "shospital," and situation.) Pt unable to scan room for calendar on R to orient to time. Pt was able to recall 50% of STM and working memory tasks during PT and wayfind to her room at end of tx.    Basic transfers with S/Min A for steadying due to posterior LOB upon standing x6 throughout tx, safety cues needed.   Gait in controlled setting with SPC and RW 3x150.' Trialing RW due to balance concerns with SPC.  Stairs x12 with bil rails and close S, cognition challenged via counting.   NMR via standing balance task for hanging laundry on line with cognitive tasks for counting and identifying objects throughout.   Pt left up at RN station.      Therapy Documentation Precautions:  Precautions Precautions: Fall Precaution Comments: Impulsive, confused Restrictions Weight Bearing Restrictions: No    Vital Signs: Therapy Vitals Pulse Rate: (!) 50 BP: (!) 122/44 mmHg Patient Position (if appropriate): Sitting Oxygen Therapy SpO2: 100 % O2 Device: Not Delivered Pain: none    See FIM for current functional status  Therapy/Group: Individual Therapy   Kennieth Rad, PT, DPT  07/04/2014, 11:39 AM

## 2014-07-04 NOTE — Discharge Instructions (Signed)
Inpatient Rehab Discharge Instructions  Michelle Farrell Discharge date and time:    Activities/Precautions/ Functional Status: Activity: activity as tolerated Diet: Soft foods. Encourage fluid intake during the day Wound Care: none needed   Functional status:  ___ No restrictions     ___ Walk up steps independently _X__ 24/7 supervision/assistance   ___ Walk up steps with assistance ___ Intermittent supervision/assistance  ___ Bathe/dress independently ___ Walk with walker     _X__ Bathe/dress with assistance ___ Walk Independently    ___ Shower independently _X__ Walk with assistance    ___ Shower with assistance _X__ No alcohol     ___ Return to work/school ________  Special Instructions:   STROKE/TIA DISCHARGE INSTRUCTIONS SMOKING Cigarette smoking nearly doubles your risk of having a stroke & is the single most alterable risk factor  If you smoke or have smoked in the last 12 months, you are advised to quit smoking for your health.  Most of the excess cardiovascular risk related to smoking disappears within a year of stopping.  Ask you doctor about anti-smoking medications  [place] Quit Line: 1-800-QUIT NOW  Free Smoking Cessation Classes [phone]  CHOLESTEROL Know your levels; limit fat & cholesterol in your diet  Lipid Panel     Component Value Date/Time   CHOL 122 06/21/2014 0550   TRIG 165* 06/21/2014 0550   HDL 27* 06/21/2014 0550   CHOLHDL 4.5 06/21/2014 0550   VLDL 33 06/21/2014 0550   LDLCALC 62 06/21/2014 0550      Many patients benefit from treatment even if their cholesterol is at goal.  Goal: Total Cholesterol (CHOL) less than 160  Goal:  Triglycerides (TRIG) less than 150  Goal:  HDL greater than 40  Goal:  LDL (LDLCALC) less than 100   BLOOD PRESSURE American Stroke Association blood pressure target is less that 120/80 mm/Hg  Your discharge blood pressure is:  BP: (!) 122/44 mmHg  Monitor your blood pressure  Limit your salt and alcohol  intake  Many individuals will require more than one medication for high blood pressure  DIABETES (A1c is a blood sugar average for last 3 months) Goal HGBA1c is under 7% (HBGA1c is blood sugar average for last 3 months)  Diabetes: No known diagnosis of diabetes    Lab Results  Component Value Date   HGBA1C 5.3 06/20/2014     Your HGBA1c can be lowered with medications, healthy diet, and exercise.  Check your blood sugar as directed by your physician  Call your physician if you experience unexplained or low blood sugars.  PHYSICAL ACTIVITY/REHABILITATION Goal is 30 minutes at least 4 days per week  Activity: No driving, Therapies: See above Return to work: N/A  Activity decreases your risk of heart attack and stroke and makes your heart stronger.  It helps control your weight and blood pressure; helps you relax and can improve your mood.  Participate in a regular exercise program.  Talk with your doctor about the best form of exercise for you (dancing, walking, swimming, cycling).  DIET/WEIGHT Goal is to maintain a healthy weight  Your discharge diet is: DIET DYS 2  Thin liquids Your height is:    Your current weight is: Weight: 72.5 kg (159 lb 13.3 oz) Your Body Mass Index (BMI) is:     Following the type of diet specifically designed for you will help prevent another stroke.  Your goal weight is:    Your goal Body Mass Index (BMI) is 19-24.  Healthy food habits  can help reduce 3 risk factors for stroke:  High cholesterol, hypertension, and excess weight.  RESOURCES Stroke/Support Group:  Call [phone]   STROKE EDUCATION PROVIDED/REVIEWED AND GIVEN TO PATIENT Stroke warning signs and symptoms How to activate emergency medical system (call 911). Medications prescribed at discharge. Need for follow-up after discharge. Personal risk factors for stroke. Pneumonia vaccine given:  Flu vaccine given:  My questions have been answered, the writing is legible, and I  understand these instructions.  I will adhere to these goals & educational materials that have been provided to me after my discharge from the hospital.      My questions have been answered and I understand these instructions. I will adhere to these goals and the provided educational materials after my discharge from the hospital.  Patient/Caregiver Signature _______________________________ Date __________  Clinician Signature _______________________________________ Date __________  Please bring this form and your medication list with you to all your follow-up doctor's appointments.

## 2014-07-04 NOTE — Progress Notes (Signed)
Occupational Therapy Session Note  Patient Details  Name: Michelle Farrell MRN: [id number] Date of Birth: [birth year]  Today's Date: 07/04/2014 OT Individual Time:  -   [id number]  (75 min)      Short Term Goals: Week 1:  OT Short Term Goal 1 (Week 1): STGs = LTGs due to short LOS :    :     Skilled Therapeutic Interventions/Progress Updates:      Pt resting in w/c at nursing station upon arrival and agreeable to therapy. Pt engaged in bathing at sink.  Pt was not oriented to place or time.  Pt. gathered items for dressing with directional cues.  Pt stood at sink for 2- 3 minutes during functional task before sitting.  Pt able to stand on right foot for 30 seconds with one arm supported while attempting to don pants.  Performed laundry task with questioning cues and multiple choice for settings on machine. Pt. Able to located laundry room after 10 minutes using functional mobility and minimal assist for balance.  Session focus on activity tolerance, functional amb with SPC, attention to task, and safety awareness. Pt transferred to bed with bed alarm on and all needs in reach.  .   Therapy Documentation Precautions:  Precautions Precautions: Fall Precaution Comments: Impulsive, confused Restrictions Weight Bearing Restrictions: No    Vital Signs: Therapy Vitals Temp: 99.4 F (37.4 C) Temp Source: Oral Pulse Rate: 95 Resp: 17 BP: (!) 130/42 mmHg Patient Position (if appropriate): Lying Oxygen Therapy SpO2: 100 % O2 Device: Not Delivered Pain:  none       See FIM for current functional status  Therapy/Group: Individual Therapy  Lisa Roca 07/04/2014, 8:20 AM

## 2014-07-04 NOTE — Progress Notes (Signed)
CRITICAL VALUE ALERT  Critical value received:  Vancomycin trough: 47.8  Date of notification:  07/04/2014  Time of notification:  2423  Critical value read back:Yes.    Nurse who received alert:  Lonzo Cloud RN  MD notified (1st page):  Dr. Algis Liming  Time of first page:  1422  MD notified (2nd page):  Time of second page:  Responding MD:  Dr. Algis Liming  Time MD responded:  [phone]

## 2014-07-04 NOTE — Progress Notes (Signed)
Pharmacy Consult  Lovenox dose is appropriate for weight and renal function. Wt: 72.5 kg Scr 3.6 eCrCl 20 ml/min   Continue Lovenox 30 mg [place] daily    Harvel Quale  07/04/2014 4:15 PM

## 2014-07-04 NOTE — Progress Notes (Deleted)
Patient taken off oxygen and attempted light ambulation and at rest on room air.  Oxygen saturation on room air maintained 97-99%. PT also verbalized that during heavy ambulation, patient maintained saturations>90%.

## 2014-07-04 NOTE — Progress Notes (Signed)
Granddaughter with concerns that her grandmother is not getting the close follow up that she needs. She has multiple concerns about her discharge as well as frequency of lab work. Relayed that last two K+ levels were stable and that frequent/daily labs not indicated. She is unhappy and feels that she is not being heard and wants to speak to MD.  Liana Gerold to call Dr. Letta Pate who's rounding however she needed to leave for work and was not willing to wait. Will have MD call with update. Will also have ID give input to help alleviate her concerns.

## 2014-07-05 ENCOUNTER — Inpatient Hospital Stay (HOSPITAL_COMMUNITY): Payer: Medicare Other | Admitting: Speech Pathology

## 2014-07-05 DIAGNOSIS — I63339 Cerebral infarction due to thrombosis of unspecified posterior cerebral artery: Secondary | ICD-10-CM

## 2014-07-05 DIAGNOSIS — N179 Acute kidney failure, unspecified: Secondary | ICD-10-CM

## 2014-07-05 LAB — COMPREHENSIVE METABOLIC PANEL
ALBUMIN: 2.6 g/dL — AB (ref 3.5–5.2)
ALT: 12 U/L (ref 0–35)
AST: 12 U/L (ref 0–37)
Alkaline Phosphatase: 47 U/L (ref 39–117)
Anion gap: 4 — ABNORMAL LOW (ref 5–15)
BUN: 7 mg/dL (ref 6–23)
CALCIUM: 9.4 mg/dL (ref 8.4–10.5)
CHLORIDE: 110 mmol/L (ref 96–112)
CO2: 25 mmol/L (ref 19–32)
Creatinine, Ser: 2.48 mg/dL — ABNORMAL HIGH (ref 0.50–1.10)
GFR calc Af Amer: 21 mL/min — ABNORMAL LOW (ref >90)
GFR, EST NON AFRICAN AMERICAN: 18 mL/min — AB (ref >90)
Glucose, Bld: 161 mg/dL — ABNORMAL HIGH (ref 70–99)
POTASSIUM: 3.7 mmol/L (ref 3.5–5.1)
SODIUM: 139 mmol/L (ref 135–145)
Total Bilirubin: 0.4 mg/dL (ref 0.3–1.2)
Total Protein: 4.9 g/dL — ABNORMAL LOW (ref 6.0–8.3)

## 2014-07-05 LAB — URINALYSIS, ROUTINE W REFLEX MICROSCOPIC
Bilirubin Urine: NEGATIVE
Glucose, UA: NEGATIVE mg/dL
HGB URINE DIPSTICK: NEGATIVE
KETONES UR: NEGATIVE mg/dL
LEUKOCYTES UA: NEGATIVE
NITRITE: NEGATIVE
Protein, ur: NEGATIVE mg/dL
Specific Gravity, Urine: 1.005 (ref 1.005–1.030)
Urobilinogen, UA: 0.2 mg/dL (ref 0.0–1.0)
pH: 6 (ref 5.0–8.0)

## 2014-07-05 LAB — TSH: TSH: 0.491 u[IU]/mL (ref 0.350–4.500)

## 2014-07-05 LAB — GLUCOSE, CAPILLARY
GLUCOSE-CAPILLARY: 147 mg/dL — AB (ref 70–99)
GLUCOSE-CAPILLARY: 128 mg/dL — AB (ref 70–99)
Glucose-Capillary: 143 mg/dL — ABNORMAL HIGH (ref 70–99)
Glucose-Capillary: 160 mg/dL — ABNORMAL HIGH (ref 70–99)

## 2014-07-05 NOTE — Progress Notes (Signed)
74 y.o. female with DM, chronic pain, hypertension, Crohn's disease, recent discharged on 06/16/14 past admission for gastroenteritis with dehydration. She was readmitted on 06/19/14 past found unresponsive on the floor by her granddaughter. She was febrile with elevated lactate and UA with 3-6 WBCs and + nitrites. Family occasional twitching and patient with neck stiffness and Dr.Kirpatick recommended Keppra as well as LP due to concerns of meningitis or encephalitis. She was started on Vanc, ceftriaxone, ampicillin and acyclovir. Urine culture grew 80,000 colonies E coli but AMS persisted.  LP done then by IR on day 2 of hospitalization and noted 28 WBCs with 79% neutrophils, mildly elevated glucose and mildly elevated protein to 85 with some RBCs in sample. CSF and blood cultures have remained negative. HSV negative. MRI brain showed acute/subacute 10 mm nonhemorrhagic infarct within the posterior medial left thalamus Subjective/Complaints: No new issues ROS limited by cognition  Objective: Vital Signs: Blood pressure 113/59, pulse 57, temperature 98.4 F (36.9 C), temperature source Oral, resp. rate 18, weight 72.5 kg (159 lb 13.3 oz), SpO2 100 %. No results found. Results for orders placed or performed during the hospital encounter of 06/27/14 (from the past 72 hour(s))  Glucose, capillary     Status: Abnormal   Collection Time: 07/02/14 11:52 AM  Result Value Ref Range   Glucose-Capillary 162 (H) 70 - 99 mg/dL   Comment 1 Notify RN   Glucose, capillary     Status: Abnormal   Collection Time: 07/02/14  4:51 PM  Result Value Ref Range   Glucose-Capillary 123 (H) 70 - 99 mg/dL   Comment 1 Notify RN   Glucose, capillary     Status: Abnormal   Collection Time: 07/02/14  9:01 PM  Result Value Ref Range   Glucose-Capillary 203 (H) 70 - 99 mg/dL   Comment 1 Notify RN   Glucose, capillary     Status: Abnormal   Collection Time: 07/03/14  6:27 AM  Result Value Ref Range   Glucose-Capillary 138 (H) 70 - 99 mg/dL   Comment 1 Notify RN   Glucose, capillary     Status: Abnormal   Collection Time: 07/03/14  4:46 PM  Result Value Ref Range   Glucose-Capillary 145 (H) 70 - 99 mg/dL   Comment 1 Notify RN   Glucose, capillary     Status: Abnormal   Collection Time: 07/03/14  9:57 PM  Result Value Ref Range   Glucose-Capillary 153 (H) 70 - 99 mg/dL  Glucose, capillary     Status: Abnormal   Collection Time: 07/04/14  6:43 AM  Result Value Ref Range   Glucose-Capillary 132 (H) 70 - 99 mg/dL   Comment 1 Notify RN   Basic metabolic panel     Status: Abnormal   Collection Time: 07/04/14  9:20 AM  Result Value Ref Range   Sodium 142 135 - 145 mmol/L   Potassium 3.6 3.5 - 5.1 mmol/L   Chloride 111 96 - 112 mmol/L   CO2 23 19 - 32 mmol/L   Glucose, Bld 192 (H) 70 - 99 mg/dL   BUN 7 6 - 23 mg/dL   Creatinine, Ser 2.49 (H) 0.50 - 1.10 mg/dL   Calcium 9.8 8.4 - 10.5 mg/dL   GFR calc non Af Amer 18 (L) >90 mL/min   GFR calc Af Amer 21 (L) >90 mL/min    Comment: (NOTE) The eGFR has been calculated using the CKD EPI equation. This calculation has not been validated in all clinical situations. eGFR's persistently <  90 mL/min signify possible Chronic Kidney Disease.    Anion gap 8 5 - 15  CBC     Status: Abnormal   Collection Time: 07/04/14  9:20 AM  Result Value Ref Range   WBC 8.0 4.0 - 10.5 K/uL   RBC 3.11 (L) 3.87 - 5.11 MIL/uL   Hemoglobin 9.4 (L) 12.0 - 15.0 g/dL   HCT 27.7 (L) 36.0 - 46.0 %   MCV 89.1 78.0 - 100.0 fL   MCH 30.2 26.0 - 34.0 pg   MCHC 33.9 30.0 - 36.0 g/dL   RDW 15.8 (H) 11.5 - 15.5 %   Platelets 230 150 - 400 K/uL  Vancomycin, trough     Status: Abnormal   Collection Time: 07/04/14 12:40 PM  Result Value Ref Range   Vancomycin Tr 47.8 (HH) 10.0 - 20.0 ug/mL    Comment: CRITICAL RESULT CALLED TO, READ BACK BY AND VERIFIED WITH: Erling Conte RN 07/04/14 1420 COSTELLO B REPEATED TO VERIFY   Magnesium     Status: None   Collection Time:  07/04/14  3:55 PM  Result Value Ref Range   Magnesium 1.6 1.5 - 2.5 mg/dL  Glucose, capillary     Status: Abnormal   Collection Time: 07/04/14  4:38 PM  Result Value Ref Range   Glucose-Capillary 142 (H) 70 - 99 mg/dL  Glucose, capillary     Status: Abnormal   Collection Time: 07/04/14  8:58 PM  Result Value Ref Range   Glucose-Capillary 145 (H) 70 - 99 mg/dL   Comment 1 Notify RN   Urinalysis, Routine w reflex microscopic     Status: Abnormal   Collection Time: 07/05/14  3:26 AM  Result Value Ref Range   Color, Urine YELLOW YELLOW   APPearance CLOUDY (A) CLEAR   Specific Gravity, Urine 1.005 1.005 - 1.030   pH 6.0 5.0 - 8.0   Glucose, UA NEGATIVE NEGATIVE mg/dL   Hgb urine dipstick NEGATIVE NEGATIVE   Bilirubin Urine NEGATIVE NEGATIVE   Ketones, ur NEGATIVE NEGATIVE mg/dL   Protein, ur NEGATIVE NEGATIVE mg/dL   Urobilinogen, UA 0.2 0.0 - 1.0 mg/dL   Nitrite NEGATIVE NEGATIVE   Leukocytes, UA NEGATIVE NEGATIVE    Comment: MICROSCOPIC NOT DONE ON URINES WITH NEGATIVE PROTEIN, BLOOD, LEUKOCYTES, NITRITE, OR GLUCOSE <1000 mg/dL.  Comprehensive metabolic panel     Status: Abnormal   Collection Time: 07/05/14  6:00 AM  Result Value Ref Range   Sodium 139 135 - 145 mmol/L   Potassium 3.7 3.5 - 5.1 mmol/L   Chloride 110 96 - 112 mmol/L   CO2 25 19 - 32 mmol/L   Glucose, Bld 161 (H) 70 - 99 mg/dL   BUN 7 6 - 23 mg/dL   Creatinine, Ser 2.48 (H) 0.50 - 1.10 mg/dL   Calcium 9.4 8.4 - 10.5 mg/dL   Total Protein 4.9 (L) 6.0 - 8.3 g/dL   Albumin 2.6 (L) 3.5 - 5.2 g/dL   AST 12 0 - 37 U/L   ALT 12 0 - 35 U/L   Alkaline Phosphatase 47 39 - 117 U/L   Total Bilirubin 0.4 0.3 - 1.2 mg/dL   GFR calc non Af Amer 18 (L) >90 mL/min   GFR calc Af Amer 21 (L) >90 mL/min    Comment: (NOTE) The eGFR has been calculated using the CKD EPI equation. This calculation has not been validated in all clinical situations. eGFR's persistently <90 mL/min signify possible Chronic Kidney Disease.     Anion gap 4 (  L) 5 - 15  TSH     Status: None   Collection Time: 07/05/14  6:00 AM  Result Value Ref Range   TSH 0.491 0.350 - 4.500 uIU/mL  Glucose, capillary     Status: Abnormal   Collection Time: 07/05/14  6:23 AM  Result Value Ref Range   Glucose-Capillary 147 (H) 70 - 99 mg/dL   Comment 1 Notify RN      HEENT: edentulous Cardio: RRR and no murmur Resp: CTA B/L and unlabored GI: BS positive and NT,ND Extremity:  Pulses positive and No Edema Skin:   Intact Neuro: Confused, Flat, Abnormal Sensory difficult to assess due to cognition, Abnormal Motor 4/5 in BUE and BLE and Abnormal FMC Ataxic/ dec FMC Musc/Skel:  Other no pain with BUE adn BLE ROM Gen NAD Perseveration  Assessment/Plan: 1. Functional deficits secondary to  Left medial thalamic infarct thrombosis SVDwhich require 3+ hours per day of interdisciplinary therapy in a comprehensive inpatient rehab setting. Physiatrist is providing close team supervision and 24 hour management of active medical problems listed below. Physiatrist and rehab team continue to assess barriers to discharge/monitor patient progress toward functional and medical goals.  FIM: FIM - Bathing Bathing Steps Patient Completed: Chest, Left Arm, Right Arm, Abdomen, Front perineal area, Right upper leg, Left upper leg, Right lower leg (including foot), Left lower leg (including foot), Buttocks Bathing: 5: Set-up assist to: Obtain items  FIM - Upper Body Dressing/Undressing Upper body dressing/undressing steps patient completed: Thread/unthread right bra strap, Thread/unthread left bra strap, Thread/unthread right sleeve of pullover shirt/dresss, Thread/unthread left sleeve of pullover shirt/dress, Put head through opening of pull over shirt/dress, Pull shirt over trunk Upper body dressing/undressing: 4: Min-Patient completed 75 plus % of tasks FIM - Lower Body Dressing/Undressing Lower body dressing/undressing steps patient completed: Pull pants  up/down, Thread/unthread left pants leg, Thread/unthread right pants leg, Don/Doff right sock, Don/Doff left sock, Don/Doff right shoe, Don/Doff left shoe, Fasten/unfasten right shoe, Fasten/unfasten left shoe Lower body dressing/undressing: 4: Min-Patient completed 75 plus % of tasks  FIM - Toileting Toileting steps completed by patient: Adjust clothing prior to toileting, Performs perineal hygiene, Adjust clothing after toileting Toileting Assistive Devices: Grab bar or rail for support Toileting: 5: Supervision: Safety issues/verbal cues  FIM - Air cabin crew Transfers: 0-Activity did not occur  FIM - Control and instrumentation engineer Devices: Walker, Arm rests, Bed rails Bed/Chair Transfer: 5: Supine > Sit: Supervision (verbal cues/safety issues), 5: Bed > Chair or W/C: Supervision (verbal cues/safety issues), 4: Chair or W/C > Bed: Min A (steadying Pt. > 75%)  FIM - Locomotion: Wheelchair Distance: 0', pt still unable to follow commands to sequence w/c mobility at this time Locomotion: Wheelchair: 0: Activity did not occur (Pt ambulatory) FIM - Locomotion: Ambulation Locomotion: Ambulation Assistive Devices: Journalist, newspaper, Environmental consultant - Rolling Ambulation/Gait Assistance: 4: Min assist, 3: Mod assist (Mod A to recover from R LOB with SPC) Locomotion: Ambulation: 4: Travels 150 ft or more with minimal assistance (Pt.>75%)  Comprehension Comprehension Mode: Auditory Comprehension: 3-Understands basic 50 - 74% of the time/requires cueing 25 - 50%  of the time  Expression Expression Mode: Verbal Expression: 2-Expresses basic 25 - 49% of the time/requires cueing 50 - 75% of the time. Uses single words/gestures.  Social Interaction Social Interaction: 2-Interacts appropriately 25 - 49% of time - Needs frequent redirection.  Problem Solving Problem Solving Mode: Not assessed Problem Solving: 2-Solves basic 25 - 49% of the time - needs direction more than  half the  time to initiate, plan or complete simple activities  Memory Memory Mode: Not assessed Memory: 2-Recognizes or recalls 25 - 49% of the time/requires cueing 51 - 75% of the time  Medical Problem List and Plan: 1. Functional deficits secondary toleft medial thalamic infarct 2. DVT Prophylaxis/Anticoagulation: Pharmaceutical: Lovenox 3. Pain Management: tylenol prn for pain 4. Mood: LCSW to follow for evaluation and support as mentation improves. 5. Neuropsych: This patient is not capable of making decisions on her own behalf. 6. Skin/Wound Care: Routine pressure relief measures.  7. Fluids/Electrolytes/Nutrition: Monitor I/O. Dehydration , diuretic use 8. HTN: monitor BP every 8 hours. Continue lisinopril, aldactone and catapres. 9. Fevers: Continue high dose ceftriaxone for two weeks for presumed meningitis.  -also with recent UTI however last ucx neg, blood culture pending  10. ABLA?:stable H and H 8.6  .  Monitor for signs of bleeding.  11. Crohn's disease: Off azulfidine at this time. No complaints of abdominal distress/diarrhea 12. DM type 2: Monitor BS with achs checks. Off Januvia due to poor intake. Will use SSI for now and resume oral meds as indicated 13. CKD: worsening once again, question whether this is due to ACE inhibitor, vancomycin or poor intake. appreciate internal medicine consultation, given multiple medical issues.     LOS (Days) 8 A FACE TO FACE EVALUATION WAS PERFORMED  KIRSTEINS,ANDREW E 07/05/2014, 10:54 AM

## 2014-07-05 NOTE — Progress Notes (Signed)
[facility] PROGRESS NOTE  Michelle Farrell OIB:[id number] DOB: 28-Oct-1940 DOA: 06/27/2014 PCP: Hermelinda Medicus, MD  Brief Summary  Michelle Farrell is a 74 y.o. female, with a pmh of heart failure (unspecified), diabetes mellitus, chronic hip and back and leg pain, hypertension, and well-controlled Crohn's disease. She had admissions in February and March of this year for gastroenteritis symptoms (n/v/d). She returned to the hospital on 06/19/14 with fever, altered mental status, and severe sepsis. There was concern for meningitis. She was found to have a UTI and a CVA. She was evaluated by neurology and infectious disease, stabilized and was transferred to [place] on 06/27/2014 on vancomycin and ceftriaxone. Infectious Disease continued to follow her in CIR. On 4/8, she was found to have acute renal failure and a vancomycin trough level of 47.  She was also on lisinopril and spironolactone. She was also noted to be bradycardic.  Assessment/Plan  Acute renal failure, likely secondary to vancomycin and dehydration in the setting of ACEI use.   Baseline creatinine 0.89. Creatinine peak 2.49 on 4/8 -  Creatinine approximately stable -  Continue to hold lisinopril and spironolactone -  Vancomycin dosing per pharmacy -  Continue IVF  Sinus bradycardia, likely side effect of clonidine and asymptomatic -  ECG pending -  TSH 0.491 -  D/c clonidine -  Will titrate hydralazine  Hypertension -  Holding spironolactone and lisinopril due to AKI -  Stopped clonidine due to bradycardia -  titrate hydralazine as needed  Hypokalemia, potassium had dropped to 2.9, resolved with supplementation -  Continue spironolactone -  Magnesium 1.6, continue magnesium supplementation  Hyperglycemia, CBG at goal (around 140 in hospital) -  continue sliding scale insulin. -  Continue low-dose Lantus  Normocytic anemia, likely due to acute illness and AKI, no obvious bleeding but has history  of iron deficiency -  Occult stool -  Iron studies, b12, folate -  TSH wnl  Diastolic heart failure, appears euvolemic -  Monitor volume status now that spironolactone is discontinued.  Thrombotic cerebral infarction -  On Aspirin 325 mg and daily lovenox for DVT prophylaxis -   Management Per Stroke and Rehab team.  Recent UTI. Has been adequately treated with antibiotics.  Suspicion for meningitis Evaluated by Infectious Disease. Is finishing a 2 week course of Rocephin which will end on 4/11.  Vancomycin had to be discontinued due to ARF.  Decreased appetite. Patient is on Megace.  HPI/Subjective:  Had speech therapy today which went well.  She feels tired, but otherwise okay.    Objective: Filed Vitals:   07/05/14 0515 07/05/14 0519 07/05/14 0522 07/05/14 1437  BP: 122/52 130/53 113/59 113/44  Pulse: 50 54 57 50  Temp: 98.4 F (36.9 C)   98.6 F (37 C)  TempSrc: Oral   Oral  Resp: 18   17  Weight:      SpO2: 100%   99%    Intake/Output Summary (Last 24 hours) at 07/05/14 1520 Last data filed at 07/05/14 1200  Gross per 24 hour  Intake    600 ml  Output      0 ml  Net    600 ml   Filed Weights   07/02/14 1900  Weight: 72.5 kg (159 lb 13.3 oz)    Exam: Body mass index is 25.81 kg/(m^2).   General: average weight female, No acute distress  HEENT:  NCAT, MMM  Cardiovascular:  RRR, nl S1, S2, 3/6 systolic murmur, 2+ pulses, warm extremities  Respiratory:  CTAB, no increased WOB  Abdomen:   NABS, soft, NT/ND  MSK:   Normal tone and bulk, no LEE  Neuro:  CN II-XII grossly intact, strength 5/5, sensation intact to light touch, has difficulty following commands and confusion when doing more comlicated tasks like finger to nose.  She needs constant reminder of instructions and only one step commands.    Data Reviewed: Basic Metabolic Panel:  Recent Labs Lab 07/04/14 0920 07/04/14 1555 07/05/14 0600  NA 142  --  139  K 3.6  --  3.7  CL 111   --  110  CO2 23  --  25  GLUCOSE 192*  --  161*  BUN 7  --  7  CREATININE 2.49*  --  2.48*  CALCIUM 9.8  --  9.4  MG  --  1.6  --    Liver Function Tests:  Recent Labs Lab 07/05/14 0600  AST 12  ALT 12  ALKPHOS 47  BILITOT 0.4  PROT 4.9*  ALBUMIN 2.6*   No results for input(s): LIPASE, AMYLASE in the last 168 hours. No results for input(s): AMMONIA in the last 168 hours. CBC:  Recent Labs Lab 07/04/14 0920  WBC 8.0  HGB 9.4*  HCT 27.7*  MCV 89.1  PLT 230   Cardiac Enzymes: No results for input(s): CKTOTAL, CKMB, CKMBINDEX, TROPONINI in the last 168 hours. BNP (last 3 results) No results for input(s): BNP in the last 8760 hours.  ProBNP (last 3 results)  Recent Labs  08/27/13 2205 08/28/13 0454  PROBNP 263.1* 251.0*    CBG:  Recent Labs Lab 07/04/14 0643 07/04/14 1638 07/04/14 2058 07/05/14 0623 07/05/14 1142  GLUCAP 132* 142* 145* 147* 143*    Recent Results (from the past 240 hour(s))  Clostridium Difficile by PCR     Status: None   Collection Time: 06/26/14  8:19 PM  Result Value Ref Range Status   C difficile by pcr NEGATIVE NEGATIVE Final     Studies: No results found.  Scheduled Meds: . aspirin  325 mg Oral Daily  . cefTRIAXone (ROCEPHIN)  IV  2 g Intravenous Q12H  . cloNIDine  0.1 mg Oral BID  . enoxaparin (LOVENOX) injection  30 mg Subcutaneous Q24H  . famotidine  20 mg Oral QHS  . feeding supplement (GLUCERNA SHAKE)  237 mL Oral TID WC  . hydrALAZINE  25 mg Oral 3 times per day  . insulin aspart  0-9 Units Subcutaneous TID WC  . insulin glargine  5 Units Subcutaneous Daily  . magnesium oxide  400 mg Oral BID  . megestrol  400 mg Oral BID  . simvastatin  40 mg Oral q1800   Continuous Infusions: . sodium chloride 75 mL/hr at 07/04/14 1400    Principal Problem:   Thrombotic cerebral infarction Active Problems:   Essential hypertension   Diastolic CHF   Sepsis   Acute encephalopathy   CVA (cerebral infarction)    Bacterial meningitis   Acute renal failure   Bradycardia   Hypokalemia   Anemia    Time spent: 30 min    Michelle Farrell, [facility] Pager [phone]. If 7PM-7AM, please contact night-coverage at [email or web address], password [facility] 07/05/2014, 3:20 PM  LOS: 8 days

## 2014-07-05 NOTE — Progress Notes (Signed)
Speech Language Pathology Daily Session Note  Patient Details  Name: Michelle Farrell MRN: [id number] Date of Birth: 1940/06/15  Today's Date: 07/05/2014 SLP Individual Time: 1445-1530 SLP Individual Time Calculation (min): 45 min  Short Term Goals: Week 1: SLP Short Term Goal 1 (Week 1): STG + LTG due to estimated length of stay   Skilled Therapeutic Interventions: Skilled ST intervention provided with tx focus on dysphagia and cognitive goals. Pt required max verbal encouragement to participate in therapy tasks. Pt oriented to date and place with use if external aids and mod verbal cues. Pt located call bell independently and required mod verbal/visual cues to demonstrate correct use. Pt participated in "spot it" card game, requiring verbal cues for redirection x5 in 10 minute period. Pt identified correct responses with 70% accuracy, min A. Verbal cue x1 required to take smaller sips of thin liquid via cup. Pt noted with no overt s/s of aspiration. Slp observed pt taking meds whole in puree, without difficulty.    FIM:  Comprehension Comprehension Mode: Auditory Comprehension: 3-Understands basic 50 - 74% of the time/requires cueing 25 - 50%  of the time Expression Expression Mode: Verbal Expression: 2-Expresses basic 25 - 49% of the time/requires cueing 50 - 75% of the time. Uses single words/gestures. Social Interaction Social Interaction: 2-Interacts appropriately 25 - 49% of time - Needs frequent redirection. Problem Solving Problem Solving: 2-Solves basic 25 - 49% of the time - needs direction more than half the time to initiate, plan or complete simple activities Memory Memory: 2-Recognizes or recalls 25 - 49% of the time/requires cueing 51 - 75% of the time FIM - Eating Eating Activity: 5: Supervision/cues  Pain Pain Assessment Pain Assessment: No/denies pain  Therapy/Group: Individual Therapy  Norlene Lanes, Bernerd Pho 07/05/2014, 3:59 PM

## 2014-07-06 ENCOUNTER — Inpatient Hospital Stay (HOSPITAL_COMMUNITY): Payer: Medicare Other | Admitting: Physical Therapy

## 2014-07-06 DIAGNOSIS — R001 Bradycardia, unspecified: Secondary | ICD-10-CM

## 2014-07-06 LAB — BASIC METABOLIC PANEL
Anion gap: 10 (ref 5–15)
BUN: 9 mg/dL (ref 6–23)
CALCIUM: 9.6 mg/dL (ref 8.4–10.5)
CO2: 20 mmol/L (ref 19–32)
Chloride: 108 mmol/L (ref 96–112)
Creatinine, Ser: 2.32 mg/dL — ABNORMAL HIGH (ref 0.50–1.10)
GFR calc non Af Amer: 20 mL/min — ABNORMAL LOW (ref >90)
GFR, EST AFRICAN AMERICAN: 23 mL/min — AB (ref >90)
Glucose, Bld: 154 mg/dL — ABNORMAL HIGH (ref 70–99)
POTASSIUM: 3.5 mmol/L (ref 3.5–5.1)
Sodium: 138 mmol/L (ref 135–145)

## 2014-07-06 LAB — GLUCOSE, CAPILLARY
GLUCOSE-CAPILLARY: 156 mg/dL — AB (ref 70–99)
Glucose-Capillary: 133 mg/dL — ABNORMAL HIGH (ref 70–99)
Glucose-Capillary: 124 mg/dL — ABNORMAL HIGH (ref 70–99)
Glucose-Capillary: 141 mg/dL — ABNORMAL HIGH (ref 70–99)

## 2014-07-06 LAB — VANCOMYCIN, RANDOM: Vancomycin Rm: 29.7 ug/mL

## 2014-07-06 MED ORDER — ONDANSETRON HCL 4 MG/2ML IJ SOLN
4.0000 mg | Freq: Once | INTRAMUSCULAR | Status: AC
  Administered 2014-07-07: 4 mg via INTRAVENOUS
  Filled 2014-07-06: qty 2

## 2014-07-06 MED ORDER — PROMETHAZINE HCL 25 MG/ML IJ SOLN
12.5000 mg | Freq: Once | INTRAMUSCULAR | Status: AC
  Administered 2014-07-06: 12.5 mg via INTRAVENOUS
  Filled 2014-07-06: qty 1

## 2014-07-06 MED ORDER — HYDRALAZINE HCL 50 MG PO TABS
50.0000 mg | ORAL_TABLET | Freq: Three times a day (TID) | ORAL | Status: DC
Start: 2014-07-06 — End: 2014-07-07
  Administered 2014-07-06 (×2): 50 mg via ORAL
  Filled 2014-07-06 (×6): qty 1

## 2014-07-06 MED ORDER — ISOSORBIDE MONONITRATE 15 MG HALF TABLET
15.0000 mg | ORAL_TABLET | Freq: Every day | ORAL | Status: DC
  Administered 2014-07-06: 15 mg via ORAL
  Filled 2014-07-06 (×3): qty 1

## 2014-07-06 NOTE — Progress Notes (Signed)
ANTIBIOTIC CONSULT NOTE - FOLLOW UP  Pharmacy Consult for vancomycin Indication: meningitis  No Known Allergies  Patient Measurements: Weight: 159 lb 13.3 oz (72.5 kg)    Vital Signs: Temp: 99 F (37.2 C) (04/10 0436) Temp Source: Oral (04/10 0436) BP: 159/52 mmHg (04/10 0436) Pulse Rate: 56 (04/10 0436) Intake/Output from previous day: 04/09 0701 - 04/10 0700 In: 840 [P.O.:840] Out: -  Intake/Output from this shift:    Labs:  Recent Labs  07/04/14 0920 07/05/14 0600 07/06/14 0440  WBC 8.0  --   --   HGB 9.4*  --   --   PLT 230  --   --   CREATININE 2.49* 2.48* 2.32*   Estimated Creatinine Clearance: 22.4 mL/min (by C-G formula based on Cr of 2.32).  Recent Labs  07/04/14 1240 07/06/14 0555  VANCOTROUGH 47.8*  --   VANCORANDOM  --  29.7     Microbiology: Recent Results (from the past 720 hour(s))  Blood Culture (routine x 2)     Status: None   Collection Time: 06/19/14 12:23 PM  Result Value Ref Range Status   Specimen Description BLOOD LEFT ANTECUBITAL  Final   Special Requests BOTTLES DRAWN AEROBIC AND ANAEROBIC 3CC  Final   Culture   Final    NO GROWTH 5 DAYS Performed at [facility]    Report Status 06/25/2014 FINAL  Final  Blood Culture (routine x 2)     Status: None   Collection Time: 06/19/14 12:36 PM  Result Value Ref Range Status   Specimen Description BLOOD LEFT ANTECUBITAL  Final   Special Requests BOTTLES DRAWN AEROBIC AND ANAEROBIC 5ML  Final   Culture   Final    NO GROWTH 5 DAYS Performed at [facility]    Report Status 06/25/2014 FINAL  Final  Urine culture     Status: None   Collection Time: 06/19/14  2:32 PM  Result Value Ref Range Status   Specimen Description URINE, CLEAN CATCH  Final   Special Requests NONE  Final   Colony Count   Final    80,000 COLONIES/ML Performed at [facility]    Culture   Final    ESCHERICHIA COLI Performed at [facility]    Report Status 06/21/2014 FINAL   Final   Organism ID, Bacteria ESCHERICHIA COLI  Final      Susceptibility   Escherichia coli - MIC*    AMPICILLIN >=32 RESISTANT Resistant     CEFAZOLIN >=64 RESISTANT Resistant     CEFTRIAXONE <=1 SENSITIVE Sensitive     CIPROFLOXACIN >=4 RESISTANT Resistant     GENTAMICIN <=1 SENSITIVE Sensitive     LEVOFLOXACIN >=8 RESISTANT Resistant     NITROFURANTOIN <=16 SENSITIVE Sensitive     TOBRAMYCIN <=1 SENSITIVE Sensitive     TRIMETH/SULFA >=320 RESISTANT Resistant     PIP/TAZO >=128 RESISTANT Resistant     * ESCHERICHIA COLI  MRSA PCR Screening     Status: None   Collection Time: 06/19/14  8:30 PM  Result Value Ref Range Status   MRSA by PCR NEGATIVE NEGATIVE Final    Comment:        The GeneXpert MRSA Assay (FDA approved for NASAL specimens only), is one component of a comprehensive MRSA colonization surveillance program. It is not intended to diagnose MRSA infection nor to guide or monitor treatment for MRSA infections.   CSF culture     Status: None   Collection Time: 06/20/14 11:33 AM  Result Value Ref Range Status   Specimen Description CSF  Final   Special Requests NONE  Final   Gram Stain   Final    FEW WBC PRESENT, PREDOMINANTLY PMN NO ORGANISMS SEEN Gram Stain Report Called to,Read Back By and Verified With: Gram Stain Report Called to,Read Back By and Verified With: ALLISON DUNDON [id number]@1 :00AM BJENN Performed at [facility]   Final    NO GROWTH 3 DAYS Performed at [facility]    Report Status 06/24/2014 FINAL  Final  Gram stain     Status: None   Collection Time: 06/20/14 11:33 AM  Result Value Ref Range Status   Specimen Description CSF  Final   Special Requests NONE  Final   Gram Stain   Final    CYTOSPIN PREP WBC PRESENT, PREDOMINANTLY PMN NO ORGANISMS SEEN    Report Status 06/20/2014 FINAL  Final  Culture, blood (routine x 2)     Status: None   Collection Time: 06/24/14  6:44 PM  Result Value Ref Range Status    Specimen Description BLOOD LEFT HAND  Final   Special Requests BOTTLES DRAWN AEROBIC ONLY 3CC  Final   Culture   Final    NO GROWTH 5 DAYS Performed at [facility]    Report Status 07/01/2014 FINAL  Final  Culture, blood (routine x 2)     Status: None   Collection Time: 06/24/14  6:53 PM  Result Value Ref Range Status   Specimen Description BLOOD LEFT HAND  Final   Special Requests BOTTLES DRAWN AEROBIC AND ANAEROBIC 5CC  Final   Culture   Final    NO GROWTH 5 DAYS Performed at [facility]    Report Status 07/01/2014 FINAL  Final  Culture, Urine     Status: None   Collection Time: 06/24/14  7:50 PM  Result Value Ref Range Status   Specimen Description URINE, RANDOM  Final   Special Requests Normal  Final   Colony Count NO GROWTH Performed at [facility]   Final   Culture NO GROWTH Performed at [facility]   Final   Report Status 06/26/2014 FINAL  Final  Clostridium Difficile by PCR     Status: None   Collection Time: 06/26/14  8:19 PM  Result Value Ref Range Status   C difficile by pcr NEGATIVE NEGATIVE Final     Assessment: 74 yo female with probable meningitis is currently on day 13/14 of vancomycin  and ceftriaxone.  SCr 2.32 (CrCl ~22) Vancomycin level was supratherapeutic @ 47.8 and vanc placed on hold Vancomycin random level this am is 29.7.  Too high to redose.  She will be covered with therapeutic vancomycin for the next 48 hours so she will complete her 14 day course without needing any additional vancomycin doses.   Goal of Therapy:  Vancomycin trough level 15-20 mcg/ml  Plan:  -no more vancomycin doses needed to complete 14 day course of vancomycin -please stop rocephin on 4/11 for 14 day course Thanks  Eudelia Bunch, Pharm.D. [phone] 07/06/2014 8:40 AM

## 2014-07-06 NOTE — Progress Notes (Signed)
Physical Therapy Session Note  Patient Details  Name: Michelle Farrell MRN: [id number] Date of Birth: 08-14-1940  Today's Date: 07/06/2014 PT Individual Time: 0800-0845 PT Individual Time Calculation (min): 45 min   Short Term Goals: Week 1:  PT Short Term Goal 1 (Week 1): = LTGs due to anticipated LOS  Skilled Therapeutic Interventions/Progress Updates:  Pt was seen bedside in the am. Pt willing to participate with therapy. Pt transferred supine to edge of bed with side rail, head of bed elevated and S. Pt transferred sit to stand with rolling walker and S with verbal cues for safety. Pt ambulated in and out of bathroom with rolling walker and S to min guard with verbal cues for safety. Pt performed toilet transfer with rolling walker and S with verbal cues for safety. Pt ambulated about 150 feet with rolling walker and S to min guard. Pt performed multiple sit to stand transfers with rolling walker and S with verbal cues. Pt ambulated through slalom course 25 feet x 2 with rolling walker and S to min guard, verbal cues for safety. Pt ambulated back to room about 150 feet with rolling walker and S to min guard with verbal cues for safety. Pt transferred edge of bed to supine with S and side rail. Pt left sitting up in bed with call bell within reach and bed alarm on.   Therapy Documentation Precautions:  Precautions Precautions: Fall Precaution Comments: Impulsive, confused Restrictions Weight Bearing Restrictions: No General:  Pain: Pain Assessment Pain Assessment: No/denies pain Mobility:   Locomotion : Ambulation Ambulation/Gait Assistance: 4: Min guard;5: Supervision   See FIM for current functional status  Therapy/Group: Individual Therapy  Dub Amis 07/06/2014, 12:45 PM

## 2014-07-06 NOTE — Progress Notes (Signed)
[facility] PROGRESS NOTE  Michelle Farrell MVE:[id number] DOB: 09/12/1940 DOA: 06/27/2014 PCP: Hermelinda Medicus, MD  Brief Summary  Michelle Farrell is a 74 y.o. female, with a pmh of heart failure (unspecified), diabetes mellitus, chronic hip and back and leg pain, hypertension, and well-controlled Crohn's disease. She had admissions in February and March of this year for gastroenteritis symptoms (n/v/d). She returned to the hospital on 06/19/14 with fever, altered mental status, and severe sepsis. There was concern for meningitis. She was found to have a UTI and a CVA. She was evaluated by neurology and infectious disease, stabilized and was transferred to [facility] on 06/27/2014 on vancomycin and ceftriaxone. Infectious Disease continued to follow her in CIR. On 4/8, she was found to have acute renal failure and a vancomycin trough level of 47.  She was also on lisinopril and spironolactone. She was also noted to be bradycardic.  Assessment/Plan  Acute renal failure, likely secondary to vancomycin and dehydration in the setting of ACEI use.   Baseline creatinine 0.89. Creatinine peak 2.49 on 4/8 -  Creatinine starting to trend down  -  Continue to hold lisinopril and spironolactone -  Vancomycin dosing per pharmacy -  D/c IVF due to lower extremity edema  Sinus bradycardia, likely side effect of clonidine and asymptomatic -  ECG:  Sinus brady -  TSH 0.491 -  Stopped clonidine on 4/9 -  Avoid BB and AV nodal blocking medications  Hypertension, BP elevated -  Holding spironolactone and lisinopril due to AKI -  Stopped clonidine due to bradycardia -  Increase hydralazine and add imdur  Hypokalemia, potassium had dropped to 2.9, resolved with supplementation -  Resume spironolactone when able -  Magnesium 1.6, continue magnesium supplementation  Hyperglycemia, CBG at goal (around 140 in hospital) -  continue sliding scale insulin. -  Continue low-dose  Lantus  Normocytic anemia, likely due to acute illness and AKI, no obvious bleeding but has history of iron deficiency -  Occult stool not obtained -  Iron studies, b12, folate pending -  TSH wnl  Chronic diastolic heart failure, appears hypervolemic without acute on chronic exacerbation -  D/c IVF -  Continue to hold diuretics for now -  Can give lasix 41m IV once if she develops shortness of breath  Thrombotic cerebral infarction -  On Aspirin 325 mg and daily lovenox for DVT prophylaxis -   Management Per Stroke and Rehab team.  Recent UTI Has been adequately treated with antibiotics.  Suspicion for meningitis Evaluated by Infectious Disease. Is finishing a 2 week course of Rocephin which will end on 4/11.  Vancomycin discontinued due to ARF  Decreased appetite. Patient is on Megace.   HPI/Subjective:  Feeling well.  Denies SOB, swelling.  Eating much better according to RN  Objective: Filed Vitals:   07/05/14 0522 07/05/14 1437 07/05/14 2145 07/06/14 0436  BP: 113/59 113/44 155/50 159/52  Pulse: 57 50  56  Temp:  98.6 F (37 C)  99 F (37.2 C)  TempSrc:  Oral  Oral  Resp:  17  18  Weight:      SpO2:  99%  100%    Intake/Output Summary (Last 24 hours) at 07/06/14 1239 Last data filed at 07/06/14 0800  Gross per 24 hour  Intake    600 ml  Output      0 ml  Net    600 ml   Filed Weights   07/02/14 1900  Weight: 72.5 kg (159 lb 13.3 oz)  Exam: Body mass index is 25.81 kg/(m^2).   General: average weight female, No acute distress  HEENT:  NCAT, MMM  Cardiovascular:  RRR, nl S1, S2, 3/6 systolic murmur, 2+ pulses, warm extremities  Respiratory:  CTAB, no increased WOB  Abdomen:   NABS, soft, NT/ND  MSK:   Normal tone and bulk, 1+ bilateral pitting LEE  Neuro:  CN II-XII grossly intact, strength 5/5, sensation intact to light touch, has difficulty following commands and confusion when doing more comlicated tasks like finger to nose.  She  needs constant reminder of instructions and only one step commands.    Data Reviewed: Basic Metabolic Panel:  Recent Labs Lab 07/04/14 0920 07/04/14 1555 07/05/14 0600 07/06/14 0440  NA 142  --  139 138  K 3.6  --  3.7 3.5  CL 111  --  110 108  CO2 23  --  25 20  GLUCOSE 192*  --  161* 154*  BUN 7  --  7 9  CREATININE 2.49*  --  2.48* 2.32*  CALCIUM 9.8  --  9.4 9.6  MG  --  1.6  --   --    Liver Function Tests:  Recent Labs Lab 07/05/14 0600  AST 12  ALT 12  ALKPHOS 47  BILITOT 0.4  PROT 4.9*  ALBUMIN 2.6*   No results for input(s): LIPASE, AMYLASE in the last 168 hours. No results for input(s): AMMONIA in the last 168 hours. CBC:  Recent Labs Lab 07/04/14 0920  WBC 8.0  HGB 9.4*  HCT 27.7*  MCV 89.1  PLT 230   Cardiac Enzymes: No results for input(s): CKTOTAL, CKMB, CKMBINDEX, TROPONINI in the last 168 hours. BNP (last 3 results) No results for input(s): BNP in the last 8760 hours.  ProBNP (last 3 results)  Recent Labs  08/27/13 2205 08/28/13 0454  PROBNP 263.1* 251.0*    CBG:  Recent Labs Lab 07/05/14 1142 07/05/14 1634 07/05/14 2152 07/06/14 0704 07/06/14 1141  GLUCAP 143* 128* 160* 133* 124*    Recent Results (from the past 240 hour(s))  Clostridium Difficile by PCR     Status: None   Collection Time: 06/26/14  8:19 PM  Result Value Ref Range Status   C difficile by pcr NEGATIVE NEGATIVE Final     Studies: No results found.  Scheduled Meds: . aspirin  325 mg Oral Daily  . cefTRIAXone (ROCEPHIN)  IV  2 g Intravenous Q12H  . enoxaparin (LOVENOX) injection  30 mg Subcutaneous Q24H  . famotidine  20 mg Oral QHS  . feeding supplement (GLUCERNA SHAKE)  237 mL Oral TID WC  . hydrALAZINE  25 mg Oral 3 times per day  . insulin aspart  0-9 Units Subcutaneous TID WC  . insulin glargine  5 Units Subcutaneous Daily  . magnesium oxide  400 mg Oral BID  . megestrol  400 mg Oral BID  . simvastatin  40 mg Oral q1800   Continuous  Infusions: . sodium chloride 75 mL/hr at 07/06/14 1144    Principal Problem:   Thrombotic cerebral infarction Active Problems:   Essential hypertension   Diastolic CHF   Sepsis   Acute encephalopathy   CVA (cerebral infarction)   Bacterial meningitis   Acute renal failure   Bradycardia   Hypokalemia   Anemia    Time spent: 30 min    Bina Veenstra, [facility] Pager [phone]. If 7PM-7AM, please contact night-coverage at [email or web address], password [facility] 07/06/2014, 12:39 PM  LOS: 9 days

## 2014-07-06 NOTE — Progress Notes (Signed)
Subjective/Complaints: No new issues States that I am a stomach Dr. Leonard Downing limited by cognition  Objective: Vital Signs: Blood pressure 159/52, pulse 56, temperature 99 F (37.2 C), temperature source Oral, resp. rate 18, weight 72.5 kg (159 lb 13.3 oz), SpO2 100 %. No results found. Results for orders placed or performed during the hospital encounter of 06/27/14 (from the past 72 hour(s))  Glucose, capillary     Status: Abnormal   Collection Time: 07/03/14  4:46 PM  Result Value Ref Range   Glucose-Capillary 145 (H) 70 - 99 mg/dL   Comment 1 Notify RN   Glucose, capillary     Status: Abnormal   Collection Time: 07/03/14  9:57 PM  Result Value Ref Range   Glucose-Capillary 153 (H) 70 - 99 mg/dL  Glucose, capillary     Status: Abnormal   Collection Time: 07/04/14  6:43 AM  Result Value Ref Range   Glucose-Capillary 132 (H) 70 - 99 mg/dL   Comment 1 Notify RN   Basic metabolic panel     Status: Abnormal   Collection Time: 07/04/14  9:20 AM  Result Value Ref Range   Sodium 142 135 - 145 mmol/L   Potassium 3.6 3.5 - 5.1 mmol/L   Chloride 111 96 - 112 mmol/L   CO2 23 19 - 32 mmol/L   Glucose, Bld 192 (H) 70 - 99 mg/dL   BUN 7 6 - 23 mg/dL   Creatinine, Ser 2.49 (H) 0.50 - 1.10 mg/dL   Calcium 9.8 8.4 - 10.5 mg/dL   GFR calc non Af Amer 18 (L) >90 mL/min   GFR calc Af Amer 21 (L) >90 mL/min    Comment: (NOTE) The eGFR has been calculated using the CKD EPI equation. This calculation has not been validated in all clinical situations. eGFR's persistently <90 mL/min signify possible Chronic Kidney Disease.    Anion gap 8 5 - 15  CBC     Status: Abnormal   Collection Time: 07/04/14  9:20 AM  Result Value Ref Range   WBC 8.0 4.0 - 10.5 K/uL   RBC 3.11 (L) 3.87 - 5.11 MIL/uL   Hemoglobin 9.4 (L) 12.0 - 15.0 g/dL   HCT 27.7 (L) 36.0 - 46.0 %   MCV 89.1 78.0 - 100.0 fL   MCH 30.2 26.0 - 34.0 pg   MCHC 33.9 30.0 - 36.0 g/dL   RDW 15.8 (H) 11.5 - 15.5 %   Platelets 230 150 -  400 K/uL  Vancomycin, trough     Status: Abnormal   Collection Time: 07/04/14 12:40 PM  Result Value Ref Range   Vancomycin Tr 47.8 (HH) 10.0 - 20.0 ug/mL    Comment: CRITICAL RESULT CALLED TO, READ BACK BY AND VERIFIED WITH: JENNINGS S RN 07/04/14 1420 COSTELLO B REPEATED TO VERIFY   Magnesium     Status: None   Collection Time: 07/04/14  3:55 PM  Result Value Ref Range   Magnesium 1.6 1.5 - 2.5 mg/dL  Glucose, capillary     Status: Abnormal   Collection Time: 07/04/14  4:38 PM  Result Value Ref Range   Glucose-Capillary 142 (H) 70 - 99 mg/dL  Glucose, capillary     Status: Abnormal   Collection Time: 07/04/14  8:58 PM  Result Value Ref Range   Glucose-Capillary 145 (H) 70 - 99 mg/dL   Comment 1 Notify RN   Urinalysis, Routine w reflex microscopic     Status: Abnormal   Collection Time: 07/05/14  3:26 AM  Result Value Ref Range   Color, Urine YELLOW YELLOW   APPearance CLOUDY (A) CLEAR   Specific Gravity, Urine 1.005 1.005 - 1.030   pH 6.0 5.0 - 8.0   Glucose, UA NEGATIVE NEGATIVE mg/dL   Hgb urine dipstick NEGATIVE NEGATIVE   Bilirubin Urine NEGATIVE NEGATIVE   Ketones, ur NEGATIVE NEGATIVE mg/dL   Protein, ur NEGATIVE NEGATIVE mg/dL   Urobilinogen, UA 0.2 0.0 - 1.0 mg/dL   Nitrite NEGATIVE NEGATIVE   Leukocytes, UA NEGATIVE NEGATIVE    Comment: MICROSCOPIC NOT DONE ON URINES WITH NEGATIVE PROTEIN, BLOOD, LEUKOCYTES, NITRITE, OR GLUCOSE <1000 mg/dL.  Comprehensive metabolic panel     Status: Abnormal   Collection Time: 07/05/14  6:00 AM  Result Value Ref Range   Sodium 139 135 - 145 mmol/L   Potassium 3.7 3.5 - 5.1 mmol/L   Chloride 110 96 - 112 mmol/L   CO2 25 19 - 32 mmol/L   Glucose, Bld 161 (H) 70 - 99 mg/dL   BUN 7 6 - 23 mg/dL   Creatinine, Ser 2.48 (H) 0.50 - 1.10 mg/dL   Calcium 9.4 8.4 - 10.5 mg/dL   Total Protein 4.9 (L) 6.0 - 8.3 g/dL   Albumin 2.6 (L) 3.5 - 5.2 g/dL   AST 12 0 - 37 U/L   ALT 12 0 - 35 U/L   Alkaline Phosphatase 47 39 - 117 U/L   Total  Bilirubin 0.4 0.3 - 1.2 mg/dL   GFR calc non Af Amer 18 (L) >90 mL/min   GFR calc Af Amer 21 (L) >90 mL/min    Comment: (NOTE) The eGFR has been calculated using the CKD EPI equation. This calculation has not been validated in all clinical situations. eGFR's persistently <90 mL/min signify possible Chronic Kidney Disease.    Anion gap 4 (L) 5 - 15  TSH     Status: None   Collection Time: 07/05/14  6:00 AM  Result Value Ref Range   TSH 0.491 0.350 - 4.500 uIU/mL  Glucose, capillary     Status: Abnormal   Collection Time: 07/05/14  6:23 AM  Result Value Ref Range   Glucose-Capillary 147 (H) 70 - 99 mg/dL   Comment 1 Notify RN   Glucose, capillary     Status: Abnormal   Collection Time: 07/05/14 11:42 AM  Result Value Ref Range   Glucose-Capillary 143 (H) 70 - 99 mg/dL   Comment 1 Notify RN   Glucose, capillary     Status: Abnormal   Collection Time: 07/05/14  4:34 PM  Result Value Ref Range   Glucose-Capillary 128 (H) 70 - 99 mg/dL   Comment 1 Notify RN   Glucose, capillary     Status: Abnormal   Collection Time: 07/05/14  9:52 PM  Result Value Ref Range   Glucose-Capillary 160 (H) 70 - 99 mg/dL  Basic metabolic panel     Status: Abnormal   Collection Time: 07/06/14  4:40 AM  Result Value Ref Range   Sodium 138 135 - 145 mmol/L   Potassium 3.5 3.5 - 5.1 mmol/L   Chloride 108 96 - 112 mmol/L   CO2 20 19 - 32 mmol/L   Glucose, Bld 154 (H) 70 - 99 mg/dL   BUN 9 6 - 23 mg/dL   Creatinine, Ser 2.32 (H) 0.50 - 1.10 mg/dL   Calcium 9.6 8.4 - 10.5 mg/dL   GFR calc non Af Amer 20 (L) >90 mL/min   GFR calc Af Amer 23 (L) >90 mL/min  Comment: (NOTE) The eGFR has been calculated using the CKD EPI equation. This calculation has not been validated in all clinical situations. eGFR's persistently <90 mL/min signify possible Chronic Kidney Disease.    Anion gap 10 5 - 15  Vancomycin, random     Status: None   Collection Time: 07/06/14  5:55 AM  Result Value Ref Range    Vancomycin Rm 29.7 ug/mL    Comment:        Random Vancomycin therapeutic range is dependent on dosage and time of specimen collection. A peak range is 20.0-40.0 ug/mL A trough range is 5.0-15.0 ug/mL          Glucose, capillary     Status: Abnormal   Collection Time: 07/06/14  7:04 AM  Result Value Ref Range   Glucose-Capillary 133 (H) 70 - 99 mg/dL     HEENT: edentulous Cardio: RRR and no murmur Resp: CTA B/L and unlabored GI: BS positive and NT,ND Extremity:  Pulses positive and No Edema Skin:   Intact Neuro: Confused,oriented to person, [facility], not time Flat, Abnormal Sensory difficult to assess due to cognition, Abnormal Motor 4/5 in BUE and BLE and Abnormal [facility]MC Ataxic/ dec [facility] Musc/Skel:  Other no pain with BUE adn BLE ROM Gen NAD Able to count backwards from 10 down to 1  Assessment/Plan: 1. Functional deficits secondary to  Left medial thalamic infarct thrombosis SVDwhich require 3+ hours per day of interdisciplinary therapy in a comprehensive inpatient rehab setting. Physiatrist is providing close team supervision and 24 hour management of active medical problems listed below. Physiatrist and rehab team continue to assess barriers to discharge/monitor patient progress toward functional and medical goals. Would like renal status to stabilize prior to discharge. May need to extend stay to later on in a week to further monitor and give IV fluids. FIM: FIM - Bathing Bathing Steps Patient Completed: Chest, Left Arm, Right Arm, Abdomen, Front perineal area, Right upper leg, Left upper leg, Right lower leg (including foot), Left lower leg (including foot), Buttocks Bathing: 5: Set-up assist to: Obtain items  FIM - Upper Body Dressing/Undressing Upper body dressing/undressing steps patient completed: Thread/unthread right bra strap, Thread/unthread left bra strap, Thread/unthread right sleeve of pullover shirt/dresss, Thread/unthread left sleeve of pullover  shirt/dress, Put head through opening of pull over shirt/dress, Pull shirt over trunk Upper body dressing/undressing: 4: Min-Patient completed 75 plus % of tasks FIM - Lower Body Dressing/Undressing Lower body dressing/undressing steps patient completed: Pull pants up/down, Thread/unthread left pants leg, Thread/unthread right pants leg, Don/Doff right sock, Don/Doff left sock, Don/Doff right shoe, Don/Doff left shoe, Fasten/unfasten right shoe, Fasten/unfasten left shoe Lower body dressing/undressing: 4: Min-Patient completed 75 plus % of tasks  FIM - Toileting Toileting steps completed by patient: Adjust clothing prior to toileting, Performs perineal hygiene, Adjust clothing after toileting Toileting Assistive Devices: Grab bar or rail for support Toileting: 5: Supervision: Safety issues/verbal cues  FIM - Air cabin crew Transfers: 0-Activity did not occur  FIM - Control and instrumentation engineer Devices: Walker, Arm rests, Bed rails Bed/Chair Transfer: 5: Supine > Sit: Supervision (verbal cues/safety issues), 5: Bed > Chair or W/C: Supervision (verbal cues/safety issues), 4: Chair or W/C > Bed: Min A (steadying Pt. > 75%)  FIM - Locomotion: Wheelchair Distance: 0', pt still unable to follow commands to sequence w/c mobility at this time Locomotion: Wheelchair: 0: Activity did not occur (Pt ambulatory) FIM - Locomotion: Ambulation Locomotion: Ambulation Assistive Devices: Journalist, newspaper, Environmental consultant - Rolling  Ambulation/Gait Assistance: 4: Min assist, 3: Mod assist (Mod A to recover from R LOB with SPC) Locomotion: Ambulation: 4: Travels 150 ft or more with minimal assistance (Pt.>75%)  Comprehension Comprehension Mode: Auditory Comprehension: 3-Understands basic 50 - 74% of the time/requires cueing 25 - 50%  of the time  Expression Expression Mode: Verbal Expression: 2-Expresses basic 25 - 49% of the time/requires cueing 50 - 75% of the time. Uses single  words/gestures.  Social Interaction Social Interaction: 2-Interacts appropriately 25 - 49% of time - Needs frequent redirection.  Problem Solving Problem Solving Mode: Not assessed Problem Solving: 2-Solves basic 25 - 49% of the time - needs direction more than half the time to initiate, plan or complete simple activities  Memory Memory Mode: Not assessed Memory: 2-Recognizes or recalls 25 - 49% of the time/requires cueing 51 - 75% of the time  Medical Problem List and Plan: 1. Functional deficits secondary toleft medial thalamic infarct 2. DVT Prophylaxis/Anticoagulation: Pharmaceutical: Lovenox 3. Pain Management: tylenol prn for pain 4. Mood: LCSW to follow for evaluation and support as mentation improves. 5. Neuropsych: This patient is not capable of making decisions on her own behalf. 6. Skin/Wound Care: Routine pressure relief measures.  7. Fluids/Electrolytes/Nutrition: Monitor I/O. Dehydration , diuretic use 8. HTN: monitor BP every 8 hours. Continue lisinopril, aldactone and catapres. 9. Fevers: Continue high dose ceftriaxone for two weeks for presumed meningitis.  -also with recent UTI however last ucx neg, blood culture pending  10. ABLA?:stable H and H 8.6  .  Monitor for signs of bleeding.  11. Crohn's disease: Off azulfidine at this time. No complaints of abdominal distress/diarrhea 12. DM type 2: Monitor BS with achs checks. Off Januvia due to poor intake. Will use SSI for now and resume oral meds as indicated 13. CKD: worsening once again, multifactorial ACE inhibitor, vancomycin or poor intake. appreciate internal medicine consultation, given multiple medical issues.     LOS (Days) 9 A FACE TO FACE EVALUATION WAS PERFORMED  Jolette Lana E 07/06/2014, 10:52 AM

## 2014-07-07 ENCOUNTER — Inpatient Hospital Stay (HOSPITAL_COMMUNITY): Payer: Medicare Other

## 2014-07-07 ENCOUNTER — Ambulatory Visit (HOSPITAL_COMMUNITY): Admission: AD | Admit: 2014-07-07 | Payer: Medicare Other | Source: Ambulatory Visit | Admitting: Internal Medicine

## 2014-07-07 ENCOUNTER — Inpatient Hospital Stay (HOSPITAL_COMMUNITY): Payer: Medicare Other | Admitting: Occupational Therapy

## 2014-07-07 ENCOUNTER — Inpatient Hospital Stay (HOSPITAL_COMMUNITY)
Admission: AD | Admit: 2014-07-07 | Discharge: 2014-07-14 | DRG: 377 | Disposition: A | Discharge status: Home / Self Care | Payer: Medicare Other | Source: Ambulatory Visit | Attending: Internal Medicine | Admitting: Internal Medicine

## 2014-07-07 ENCOUNTER — Inpatient Hospital Stay (HOSPITAL_COMMUNITY): Payer: Medicare Other | Admitting: Speech Pathology

## 2014-07-07 DIAGNOSIS — M25559 Pain in unspecified hip: Secondary | ICD-10-CM | POA: Diagnosis present

## 2014-07-07 DIAGNOSIS — K449 Diaphragmatic hernia without obstruction or gangrene: Secondary | ICD-10-CM | POA: Diagnosis present

## 2014-07-07 DIAGNOSIS — A419 Sepsis, unspecified organism: Secondary | ICD-10-CM | POA: Diagnosis present

## 2014-07-07 DIAGNOSIS — G9340 Encephalopathy, unspecified: Secondary | ICD-10-CM | POA: Diagnosis not present

## 2014-07-07 DIAGNOSIS — T465X5A Adverse effect of other antihypertensive drugs, initial encounter: Secondary | ICD-10-CM | POA: Diagnosis present

## 2014-07-07 DIAGNOSIS — D631 Anemia in chronic kidney disease: Secondary | ICD-10-CM | POA: Diagnosis present

## 2014-07-07 DIAGNOSIS — K5090 Crohn's disease, unspecified, without complications: Secondary | ICD-10-CM | POA: Diagnosis present

## 2014-07-07 DIAGNOSIS — I129 Hypertensive chronic kidney disease with stage 1 through stage 4 chronic kidney disease, or unspecified chronic kidney disease: Secondary | ICD-10-CM | POA: Diagnosis present

## 2014-07-07 DIAGNOSIS — E876 Hypokalemia: Secondary | ICD-10-CM | POA: Diagnosis present

## 2014-07-07 DIAGNOSIS — F17200 Nicotine dependence, unspecified, uncomplicated: Secondary | ICD-10-CM | POA: Diagnosis present

## 2014-07-07 DIAGNOSIS — Z90710 Acquired absence of both cervix and uterus: Secondary | ICD-10-CM

## 2014-07-07 DIAGNOSIS — G009 Bacterial meningitis, unspecified: Secondary | ICD-10-CM | POA: Diagnosis present

## 2014-07-07 DIAGNOSIS — K920 Hematemesis: Secondary | ICD-10-CM | POA: Diagnosis not present

## 2014-07-07 DIAGNOSIS — N170 Acute kidney failure with tubular necrosis: Secondary | ICD-10-CM | POA: Diagnosis present

## 2014-07-07 DIAGNOSIS — N179 Acute kidney failure, unspecified: Secondary | ICD-10-CM | POA: Diagnosis not present

## 2014-07-07 DIAGNOSIS — I5030 Unspecified diastolic (congestive) heart failure: Secondary | ICD-10-CM | POA: Diagnosis present

## 2014-07-07 DIAGNOSIS — E860 Dehydration: Secondary | ICD-10-CM | POA: Diagnosis present

## 2014-07-07 DIAGNOSIS — K2970 Gastritis, unspecified, without bleeding: Secondary | ICD-10-CM | POA: Diagnosis not present

## 2014-07-07 DIAGNOSIS — I6330 Cerebral infarction due to thrombosis of unspecified cerebral artery: Secondary | ICD-10-CM | POA: Diagnosis present

## 2014-07-07 DIAGNOSIS — R110 Nausea: Secondary | ICD-10-CM

## 2014-07-07 DIAGNOSIS — R1900 Intra-abdominal and pelvic swelling, mass and lump, unspecified site: Secondary | ICD-10-CM | POA: Diagnosis present

## 2014-07-07 DIAGNOSIS — I2510 Atherosclerotic heart disease of native coronary artery without angina pectoris: Secondary | ICD-10-CM | POA: Diagnosis present

## 2014-07-07 DIAGNOSIS — Z833 Family history of diabetes mellitus: Secondary | ICD-10-CM

## 2014-07-07 DIAGNOSIS — M549 Dorsalgia, unspecified: Secondary | ICD-10-CM | POA: Diagnosis present

## 2014-07-07 DIAGNOSIS — R933 Abnormal findings on diagnostic imaging of other parts of digestive tract: Secondary | ICD-10-CM | POA: Diagnosis not present

## 2014-07-07 DIAGNOSIS — I5032 Chronic diastolic (congestive) heart failure: Secondary | ICD-10-CM | POA: Diagnosis present

## 2014-07-07 DIAGNOSIS — E119 Type 2 diabetes mellitus without complications: Secondary | ICD-10-CM | POA: Diagnosis present

## 2014-07-07 DIAGNOSIS — N189 Chronic kidney disease, unspecified: Secondary | ICD-10-CM | POA: Diagnosis present

## 2014-07-07 DIAGNOSIS — K50919 Crohn's disease, unspecified, with unspecified complications: Secondary | ICD-10-CM

## 2014-07-07 DIAGNOSIS — R112 Nausea with vomiting, unspecified: Secondary | ICD-10-CM | POA: Diagnosis present

## 2014-07-07 DIAGNOSIS — R4701 Aphasia: Secondary | ICD-10-CM | POA: Diagnosis present

## 2014-07-07 DIAGNOSIS — R001 Bradycardia, unspecified: Secondary | ICD-10-CM | POA: Diagnosis present

## 2014-07-07 DIAGNOSIS — T368X5A Adverse effect of other systemic antibiotics, initial encounter: Secondary | ICD-10-CM | POA: Diagnosis present

## 2014-07-07 DIAGNOSIS — K209 Esophagitis, unspecified without bleeding: Secondary | ICD-10-CM | POA: Diagnosis present

## 2014-07-07 DIAGNOSIS — I10 Essential (primary) hypertension: Secondary | ICD-10-CM | POA: Diagnosis not present

## 2014-07-07 DIAGNOSIS — R1011 Right upper quadrant pain: Secondary | ICD-10-CM

## 2014-07-07 DIAGNOSIS — K2981 Duodenitis with bleeding: Principal | ICD-10-CM | POA: Diagnosis present

## 2014-07-07 DIAGNOSIS — R1110 Vomiting, unspecified: Secondary | ICD-10-CM | POA: Diagnosis not present

## 2014-07-07 DIAGNOSIS — D649 Anemia, unspecified: Secondary | ICD-10-CM | POA: Diagnosis present

## 2014-07-07 DIAGNOSIS — I639 Cerebral infarction, unspecified: Secondary | ICD-10-CM | POA: Diagnosis present

## 2014-07-07 DIAGNOSIS — K210 Gastro-esophageal reflux disease with esophagitis: Secondary | ICD-10-CM | POA: Diagnosis present

## 2014-07-07 DIAGNOSIS — E872 Acidosis, unspecified: Secondary | ICD-10-CM | POA: Diagnosis present

## 2014-07-07 DIAGNOSIS — K2990 Gastroduodenitis, unspecified, without bleeding: Secondary | ICD-10-CM

## 2014-07-07 DIAGNOSIS — Z79899 Other long term (current) drug therapy: Secondary | ICD-10-CM

## 2014-07-07 DIAGNOSIS — Z8673 Personal history of transient ischemic attack (TIA), and cerebral infarction without residual deficits: Secondary | ICD-10-CM

## 2014-07-07 DIAGNOSIS — K5080 Crohn's disease of both small and large intestine without complications: Secondary | ICD-10-CM | POA: Diagnosis present

## 2014-07-07 DIAGNOSIS — J449 Chronic obstructive pulmonary disease, unspecified: Secondary | ICD-10-CM | POA: Diagnosis present

## 2014-07-07 HISTORY — DX: Unspecified osteoarthritis, unspecified site: M19.90

## 2014-07-07 HISTORY — DX: Acute kidney failure, unspecified: N17.9

## 2014-07-07 HISTORY — DX: Sleep apnea, unspecified: G47.30

## 2014-07-07 LAB — BASIC METABOLIC PANEL
Anion gap: 17 — ABNORMAL HIGH (ref 5–15)
BUN: 9 mg/dL (ref 6–23)
CHLORIDE: 108 mmol/L (ref 96–112)
CO2: 18 mmol/L — ABNORMAL LOW (ref 19–32)
Calcium: 10.4 mg/dL (ref 8.4–10.5)
Creatinine, Ser: 2.42 mg/dL — ABNORMAL HIGH (ref 0.50–1.10)
GFR calc Af Amer: 22 mL/min — ABNORMAL LOW (ref >90)
GFR calc non Af Amer: 19 mL/min — ABNORMAL LOW (ref >90)
GLUCOSE: 212 mg/dL — AB (ref 70–99)
POTASSIUM: 4.3 mmol/L (ref 3.5–5.1)
Sodium: 143 mmol/L (ref 135–145)

## 2014-07-07 LAB — COMPREHENSIVE METABOLIC PANEL
ALT: 16 U/L (ref 0–35)
AST: 23 U/L (ref 0–37)
Albumin: 3.1 g/dL — ABNORMAL LOW (ref 3.5–5.2)
Alkaline Phosphatase: 52 U/L (ref 39–117)
Anion gap: 13 (ref 5–15)
BUN: 9 mg/dL (ref 6–23)
CALCIUM: 10.1 mg/dL (ref 8.4–10.5)
CO2: 19 mmol/L (ref 19–32)
CREATININE: 2.31 mg/dL — AB (ref 0.50–1.10)
Chloride: 109 mmol/L (ref 96–112)
GFR, EST AFRICAN AMERICAN: 23 mL/min — AB (ref >90)
GFR, EST NON AFRICAN AMERICAN: 20 mL/min — AB (ref >90)
GLUCOSE: 201 mg/dL — AB (ref 70–99)
Potassium: 4.1 mmol/L (ref 3.5–5.1)
Sodium: 141 mmol/L (ref 135–145)
Total Bilirubin: 0.7 mg/dL (ref 0.3–1.2)
Total Protein: 6.6 g/dL (ref 6.0–8.3)

## 2014-07-07 LAB — GLUCOSE, CAPILLARY
GLUCOSE-CAPILLARY: 125 mg/dL — AB (ref 70–99)
GLUCOSE-CAPILLARY: 145 mg/dL — AB (ref 70–99)
Glucose-Capillary: 185 mg/dL — ABNORMAL HIGH (ref 70–99)
Glucose-Capillary: 211 mg/dL — ABNORMAL HIGH (ref 70–99)
Glucose-Capillary: 177 mg/dL — ABNORMAL HIGH (ref 70–99)

## 2014-07-07 LAB — CBC
HCT: 30.2 % — ABNORMAL LOW (ref 36.0–46.0)
Hemoglobin: 10.5 g/dL — ABNORMAL LOW (ref 12.0–15.0)
MCH: 30.3 pg (ref 26.0–34.0)
MCHC: 34.8 g/dL (ref 30.0–36.0)
MCV: 87 fL (ref 78.0–100.0)
Platelets: 296 10*3/uL (ref 150–400)
RBC: 3.47 MIL/uL — ABNORMAL LOW (ref 3.87–5.11)
RDW: 15.5 % (ref 11.5–15.5)
WBC: 9.8 10*3/uL (ref 4.0–10.5)

## 2014-07-07 LAB — TRANSFERRIN: TRANSFERRIN: 142 mg/dL — AB (ref 200–370)

## 2014-07-07 LAB — CBC WITH DIFFERENTIAL/PLATELET
BASOS ABS: 0 10*3/uL (ref 0.0–0.1)
BASOS PCT: 0 % (ref 0–1)
Eosinophils Absolute: 0 10*3/uL (ref 0.0–0.7)
Eosinophils Relative: 0 % (ref 0–5)
HEMATOCRIT: 30.3 % — AB (ref 36.0–46.0)
HEMOGLOBIN: 10.4 g/dL — AB (ref 12.0–15.0)
Lymphocytes Relative: 9 % — ABNORMAL LOW (ref 12–46)
Lymphs Abs: 0.9 10*3/uL (ref 0.7–4.0)
MCH: 30.1 pg (ref 26.0–34.0)
MCHC: 34.3 g/dL (ref 30.0–36.0)
MCV: 87.8 fL (ref 78.0–100.0)
MONO ABS: 0.6 10*3/uL (ref 0.1–1.0)
Monocytes Relative: 6 % (ref 3–12)
NEUTROS ABS: 8.9 10*3/uL — AB (ref 1.7–7.7)
Neutrophils Relative %: 85 % — ABNORMAL HIGH (ref 43–77)
Platelets: 323 10*3/uL (ref 150–400)
RBC: 3.45 MIL/uL — ABNORMAL LOW (ref 3.87–5.11)
RDW: 15.5 % (ref 11.5–15.5)
WBC: 10.4 10*3/uL (ref 4.0–10.5)

## 2014-07-07 LAB — OCCULT BLOOD GASTRIC / DUODENUM (SPECIMEN CUP): Occult Blood, Gastric: POSITIVE — AB

## 2014-07-07 LAB — HEPATIC FUNCTION PANEL
ALBUMIN: 3.3 g/dL — AB (ref 3.5–5.2)
ALT: 14 U/L (ref 0–35)
AST: 23 U/L (ref 0–37)
Alkaline Phosphatase: 55 U/L (ref 39–117)
BILIRUBIN TOTAL: 0.5 mg/dL (ref 0.3–1.2)
Total Protein: 6.5 g/dL (ref 6.0–8.3)

## 2014-07-07 LAB — LACTIC ACID, PLASMA
LACTIC ACID, VENOUS: 1.8 mmol/L (ref 0.5–2.0)
Lactic Acid, Venous: 1.2 mmol/L (ref 0.5–2.0)

## 2014-07-07 LAB — IRON AND TIBC
IRON: 48 ug/dL (ref 42–145)
Saturation Ratios: 27 % (ref 20–55)
TIBC: 176 ug/dL — ABNORMAL LOW (ref 250–470)
UIBC: 128 ug/dL (ref 125–400)

## 2014-07-07 LAB — APTT: aPTT: 30 seconds (ref 24–37)

## 2014-07-07 LAB — LIPASE, BLOOD: Lipase: 20 U/L (ref 11–59)

## 2014-07-07 LAB — PROCALCITONIN: Procalcitonin: 0.12 ng/mL

## 2014-07-07 LAB — FERRITIN: FERRITIN: 186 ng/mL (ref 10–291)

## 2014-07-07 LAB — PROTIME-INR
INR: 1.05 (ref 0.00–1.49)
Prothrombin Time: 13.8 seconds (ref 11.6–15.2)

## 2014-07-07 LAB — VITAMIN B12: VITAMIN B 12: 229 pg/mL (ref 211–911)

## 2014-07-07 LAB — VANCOMYCIN, RANDOM: VANCOMYCIN RM: 19.9 ug/mL

## 2014-07-07 LAB — FOLATE: Folate: 6.1 ng/mL

## 2014-07-07 MED ORDER — PANTOPRAZOLE SODIUM 40 MG IV SOLR
80.0000 mg | Freq: Once | INTRAVENOUS | Status: AC
  Administered 2014-07-07: 80 mg via INTRAVENOUS
  Filled 2014-07-07: qty 80

## 2014-07-07 MED ORDER — METOCLOPRAMIDE HCL 5 MG/ML IJ SOLN
10.0000 mg | Freq: Once | INTRAMUSCULAR | Status: AC
  Administered 2014-07-07: 10 mg via INTRAVENOUS
  Filled 2014-07-07: qty 2

## 2014-07-07 MED ORDER — SODIUM CHLORIDE 0.9 % IV BOLUS (SEPSIS)
1000.0000 mL | INTRAVENOUS | Status: DC
  Administered 2014-07-07: 1000 mL via INTRAVENOUS

## 2014-07-07 MED ORDER — SODIUM CHLORIDE 0.9 % IV BOLUS (SEPSIS)
500.0000 mL | Freq: Once | INTRAVENOUS | Status: AC
  Administered 2014-07-07: 500 mL via INTRAVENOUS

## 2014-07-07 MED ORDER — INSULIN ASPART 100 UNIT/ML ~~LOC~~ SOLN
0.0000 [IU] | Freq: Four times a day (QID) | SUBCUTANEOUS | Status: DC
  Administered 2014-07-07: 2 [IU] via SUBCUTANEOUS
  Administered 2014-07-07: 3 [IU] via SUBCUTANEOUS
  Administered 2014-07-08 (×2): 2 [IU] via SUBCUTANEOUS
  Administered 2014-07-08 (×2): 1 [IU] via SUBCUTANEOUS
  Administered 2014-07-09: 5 [IU] via SUBCUTANEOUS
  Administered 2014-07-09: 3 [IU] via SUBCUTANEOUS
  Administered 2014-07-09 (×2): 1 [IU] via SUBCUTANEOUS
  Administered 2014-07-10 (×2): 2 [IU] via SUBCUTANEOUS
  Administered 2014-07-10: 1 [IU] via SUBCUTANEOUS

## 2014-07-07 MED ORDER — SODIUM CHLORIDE 0.9 % IJ SOLN
10.0000 mL | INTRAMUSCULAR | Status: DC | PRN
  Administered 2014-07-07 – 2014-07-14 (×11): 10 mL
  Filled 2014-07-07 (×10): qty 40

## 2014-07-07 MED ORDER — IOHEXOL 300 MG/ML  SOLN
25.0000 mL | INTRAMUSCULAR | Status: AC
  Administered 2014-07-07 (×2): 25 mL via ORAL

## 2014-07-07 MED ORDER — PANTOPRAZOLE SODIUM 40 MG IV SOLR
40.0000 mg | Freq: Once | INTRAVENOUS | Status: AC
  Administered 2014-07-07: 40 mg via INTRAVENOUS
  Filled 2014-07-07: qty 40

## 2014-07-07 MED ORDER — SODIUM CHLORIDE 0.9 % IJ SOLN: 3.0000 mL | Freq: Two times a day (BID) | INTRAMUSCULAR | Status: DC

## 2014-07-07 MED ORDER — PANTOPRAZOLE SODIUM 40 MG IV SOLR
40.0000 mg | Freq: Two times a day (BID) | INTRAVENOUS | Status: DC
  Administered 2014-07-07 – 2014-07-08 (×2): 40 mg via INTRAVENOUS
  Filled 2014-07-07 (×3): qty 40

## 2014-07-07 MED ORDER — CYANOCOBALAMIN 1000 MCG/ML IJ SOLN
1000.0000 ug | Freq: Once | INTRAMUSCULAR | Status: AC
  Administered 2014-07-07: 1000 ug via INTRAMUSCULAR
  Filled 2014-07-07: qty 1

## 2014-07-07 MED ORDER — SODIUM CHLORIDE 0.9 % IV BOLUS (SEPSIS): 1000.0000 mL | Freq: Once | INTRAVENOUS | Status: DC

## 2014-07-07 MED ORDER — PANTOPRAZOLE SODIUM 40 MG IV SOLR: 40.0000 mg | Freq: Two times a day (BID) | INTRAVENOUS | Status: DC

## 2014-07-07 MED ORDER — SODIUM CHLORIDE 0.9 % IV SOLN
INTRAVENOUS | Status: DC
  Administered 2014-07-08 – 2014-07-11 (×3): via INTRAVENOUS

## 2014-07-07 MED ORDER — PROMETHAZINE HCL 25 MG/ML IJ SOLN
12.5000 mg | Freq: Once | INTRAMUSCULAR | Status: AC
  Administered 2014-07-07: 12.5 mg via INTRAVENOUS
  Filled 2014-07-07: qty 1

## 2014-07-07 MED ORDER — SODIUM CHLORIDE 0.9 % IJ SOLN: 10.0000 mL | Freq: Two times a day (BID) | INTRAMUSCULAR | Status: DC

## 2014-07-07 MED ORDER — PANTOPRAZOLE SODIUM 40 MG IV SOLR
8.0000 mg/h | INTRAVENOUS | Status: DC
  Administered 2014-07-07: 8 mg/h via INTRAVENOUS
  Filled 2014-07-07 (×2): qty 80

## 2014-07-07 MED ORDER — LORAZEPAM 2 MG/ML IJ SOLN: 0.5000 mg | INTRAMUSCULAR | Status: DC | PRN

## 2014-07-07 MED ORDER — ONDANSETRON HCL 4 MG PO TABS: 4.0000 mg | ORAL_TABLET | Freq: Four times a day (QID) | ORAL | Status: DC | PRN

## 2014-07-07 MED ORDER — SIMETHICONE 80 MG PO CHEW
160.0000 mg | CHEWABLE_TABLET | Freq: Four times a day (QID) | ORAL | Status: DC | PRN
  Filled 2014-07-07: qty 2

## 2014-07-07 MED ORDER — METRONIDAZOLE IN NACL 5-0.79 MG/ML-% IV SOLN
500.0000 mg | Freq: Three times a day (TID) | INTRAVENOUS | Status: DC
  Administered 2014-07-07 – 2014-07-12 (×16): 500 mg via INTRAVENOUS
  Filled 2014-07-07 (×18): qty 100

## 2014-07-07 MED ORDER — PANTOPRAZOLE SODIUM 40 MG IV SOLR
40.0000 mg | Freq: Two times a day (BID) | INTRAVENOUS | Status: DC
  Filled 2014-07-07 (×3): qty 40

## 2014-07-07 MED ORDER — SODIUM CHLORIDE 0.9 % IV BOLUS (SEPSIS): 500.0000 mL | INTRAVENOUS | Status: DC

## 2014-07-07 MED ORDER — ONDANSETRON HCL 4 MG/2ML IJ SOLN
4.0000 mg | Freq: Four times a day (QID) | INTRAMUSCULAR | Status: DC | PRN
  Administered 2014-07-09 – 2014-07-12 (×2): 4 mg via INTRAVENOUS
  Filled 2014-07-07 (×3): qty 2

## 2014-07-07 MED ORDER — BISACODYL 10 MG RE SUPP: 10.0000 mg | Freq: Every day | RECTAL | Status: DC | PRN

## 2014-07-07 MED ORDER — DEXTROSE 5 % IV SOLN
1.0000 g | INTRAVENOUS | Status: DC
  Administered 2014-07-07 – 2014-07-13 (×7): 1 g via INTRAVENOUS
  Filled 2014-07-07 (×7): qty 1

## 2014-07-07 MED ORDER — HYDRALAZINE HCL 20 MG/ML IJ SOLN
10.0000 mg | Freq: Once | INTRAMUSCULAR | Status: AC
  Administered 2014-07-07: 10 mg via INTRAVENOUS
  Filled 2014-07-07: qty 0.5

## 2014-07-07 MED ORDER — HYDRALAZINE HCL 20 MG/ML IJ SOLN
10.0000 mg | Freq: Four times a day (QID) | INTRAMUSCULAR | Status: DC
  Filled 2014-07-07 (×5): qty 0.5

## 2014-07-07 MED ORDER — METOPROLOL TARTRATE 1 MG/ML IV SOLN
5.0000 mg | Freq: Four times a day (QID) | INTRAVENOUS | Status: DC | PRN
  Administered 2014-07-07: 5 mg via INTRAVENOUS
  Filled 2014-07-07 (×2): qty 5

## 2014-07-07 MED ORDER — HYDRALAZINE HCL 20 MG/ML IJ SOLN
10.0000 mg | Freq: Four times a day (QID) | INTRAMUSCULAR | Status: DC
  Administered 2014-07-07 – 2014-07-08 (×5): 10 mg via INTRAVENOUS
  Filled 2014-07-07: qty 1
  Filled 2014-07-07: qty 0.5
  Filled 2014-07-07: qty 1
  Filled 2014-07-07 (×2): qty 0.5
  Filled 2014-07-07: qty 1
  Filled 2014-07-07: qty 0.5

## 2014-07-07 NOTE — Consult Note (Signed)
[facility] Consult: 1:03 PM 07/07/2014  LOS: 0 days    Referring Provider: Dr Sheran Fava.   Primary Care Physician:  Michelle Medicus, MD Primary Gastroenterologist:  Previously Dr. Delfin Edis, now with GI in [facility].    Reason for Consultation: CG emesis.    HPI: Michelle Farrell is a 74 y.o. female.  PMH DM, chronic pain, Crohn's ileocolitis (takes Sulfasalazine 500 mg QID).    GERD and dysmotility, s/p dilation of esophagus in 2011 and ~2006, hx NSAID induced ulcers in 1990s.  CHF-D (grade 1), CKD, COPD.  Pt has not been seen by Dr Olevia Perches since her colonoscopy in 10/2009.  Pt previously on Asacol but now on Sulfasalazine.   [facility] stay 05/16/14- 05/18/14 and 3/16 - 06/16/14 with gastroenteritis and dehydration. C diff negative 06/26/14.   CT abd/pelvis 05/16/14 showed cecal and distal SB distension without obstruction or signs of inflammation, fat containing ventral and umbilical hernia, large right pelvic cystic structure (?pelvic inclusion cyst, less likely bladder tic) which has yet to be further evaluated.  [facility] inpt 3/24 - 06/27/14 with sepsis (likely urosepsis, ID did not endorse dx of bacterial meningitis).  CVA on MRI.  AKI.  Discharge meds included vanco and Rocephin,  20 mg Omeprazole qday, Pepcid 20 mg at HS, 325 mg ASA, prn Zofran, . Hydrocodone was discontinued.  [facility] in[facility] rehab from 4/1 - 07/07/14. U/A negative on 07/05/14.   Today transferred back to inpt with intractable n/v and hematemesis, pain in left abdomen for the past 2 days.  Starting in afternoon yesterday had 3 or 4 episodes of vomiting undigested food, progressed to bilious then progressed to CG with scant streaks of blood overnight.  Never had a BM.  Since transfer back to inpt floor:  No emesis.   In past 4 days Hgb went form 9.4 to 10.5. Up  from nadir of 8.1 on 4/1.  Portable KUB unremarkable. Non-contrast CT ab/pelvis ordered.  Pt has some expressive aphasia and is drowsy, not able to provide detailed history.  Her sistersays that between bouts of n/v in last several months, she had not had significant GI complaints.      Past Medical History  Diagnosis Date  . CHF (congestive heart failure)   . Hypertension   . Back pain, chronic   . Hip pain, chronic   . GERD (gastroesophageal reflux disease)   . Crohn disease   . Diabetes mellitus without complication   . Esophageal stricture     s/p dilation 2003, 2008  . PUD (peptic ulcer disease)   . CKD (chronic kidney disease)   . COPD (chronic obstructive pulmonary disease)     Past Surgical History  Procedure Laterality Date  . Abdominal hysterectomy    . Tonsillectomy    . Cholecystectomy    . Bladder surgery      Prior to Admission medications   Medication Sig Start Date End Date Taking? Authorizing Provider  bisacodyl (DULCOLAX) 10 MG suppository Place 1 suppository (10 mg total) rectally daily as needed for moderate constipation. 06/27/14  Donne Hazel, MD  cefTRIAXone (ROCEPHIN) 40 MG/ML IVPB Inject 50 mLs (2 g total) into the vein every 12 (twelve) hours. 06/27/14   Donne Hazel, MD  Cholecalciferol (VITAMIN D) 2000 UNITS CAPS Take 1 capsule by mouth daily.    Historical Provider, MD  folic acid (FOLVITE) 1 MG tablet Take 1 mg by mouth daily.    Historical Provider, MD  omeprazole (PRILOSEC) 20 MG capsule Take 20 mg by mouth every morning.     Historical Provider, MD  ondansetron (ZOFRAN) 4 MG tablet Take 1 tablet (4 mg total) by mouth every 6 (six) hours as needed for nausea. 06/16/14   Velvet Bathe, MD  PROAIR HFA 108 (90 BASE) MCG/ACT inhaler Inhale 1 puff into the lungs every 6 (six) hours as needed. Shortness of breath or wheezing 03/03/14   Historical Provider, MD    Scheduled Meds: . ceFEPime (MAXIPIME) IV  1 g Intravenous Q24H  . hydrALAZINE  10 mg  Intravenous Q6H  . metronidazole  500 mg Intravenous Q8H  . pantoprazole (PROTONIX) IV  80 mg Intravenous Once  . [START ON 07/11/2014] pantoprazole (PROTONIX) IV  40 mg Intravenous Q12H  . sodium chloride  1,000 mL Intravenous Q1H   Followed by  . sodium chloride  500 mL Intravenous Q1H  . sodium chloride  3 mL Intravenous Q12H   Infusions: . pantoprozole (PROTONIX) infusion     PRN Meds: bisacodyl, ondansetron **OR** ondansetron (ZOFRAN) IV   Allergies as of 07/07/2014  . (No Known Allergies)    Family History  Problem Relation Age of Onset  . Diabetes Mellitus II Other     History   Social History  . Marital Status: Widowed    Spouse Name: N/A  . Number of Children: N/A  . Years of Education: N/A   Occupational History  . Not on file.   Social History Main Topics  . Smoking status: Current Every Day Smoker  . Smokeless tobacco: Never Used  . Alcohol Use: No  . Drug Use: No  . Sexual Activity: No   Other Topics Concern  . Not on file   Social History Narrative    REVIEW OF SYSTEMS: Constitutional:  On rehab had progressed to walking with walker and speech had improved.  ENT:  No nose bleeds.  Normally wears full set of dentures which are at home  Pulm:  No cough or dyspnea CV:  No palpitations, no LE edema.  GU:  No hematuria, no frequency GI:  Per HPI Heme:  No hx excessive bleeding or bruising.    Transfusions:  None in records Neuro:  No headaches, no peripheral tingling or numbness Derm:  No itching, no rash or sores.  Endocrine:  No sweats or chills.  No polyuria or dysuria Immunization:  Not queried Travel:  None beyond local counties in last few months.    PHYSICAL EXAM: Vital signs in last 24 hours: There were no vitals filed for this visit. Wt Readings from Last 3 Encounters:  07/02/14 159 lb 13.3 oz (72.5 kg)  06/25/14 162 lb 4.1 oz (73.6 kg)  06/16/14 165 lb 12.6 oz (75.2 kg)    General: somewhat ill looking but comfortable elderly  AAF.  Head:  No facial edema, mouth drops on the right  Eyes:  No icterus or conj pallor Ears:  Not HOH  Nose:  No congestion or discharge Mouth:  Clear, moist oral mucosa, edentulous. Neck:  No JVD, no mass, no TMG Lungs:  Scant  fine crackles in bases bil.  No dyspnea or cough.  Heart: RRR.  No mrg Abdomen:  Soft, NT, ND, scant BS.  No mass, bruits or HSM. Marland Kitchen   Rectal: scant mucoid, brown stool is FOBT negative.  The rectal vault is lax in tone.    Musc/Skeltl: no joint swelling or redness.   Extremities:  No CCE  Neurologic:  Oriented to "hospital", "2005" and to self.  She is drowsy though not obtunded but answers and acts appropriately.  Skin:  No rash or sores Tattoos:  None.  Nodes:  No cervical adenopathy.    Psych:  Drowsy, affect blunted. Calm.   Intake/Output from previous day:   Intake/Output this shift:    LAB RESULTS:  Recent Labs  07/07/14 0600  WBC 9.8  HGB 10.5*  HCT 30.2*  PLT 296   BMET Lab Results  Component Value Date   NA 143 07/07/2014   NA 138 07/06/2014   NA 139 07/05/2014   K 4.3 07/07/2014   K 3.5 07/06/2014   K 3.7 07/05/2014   CL 108 07/07/2014   CL 108 07/06/2014   CL 110 07/05/2014   CO2 18* 07/07/2014   CO2 20 07/06/2014   CO2 25 07/05/2014   GLUCOSE 212* 07/07/2014   GLUCOSE 154* 07/06/2014   GLUCOSE 161* 07/05/2014   BUN 9 07/07/2014   BUN 9 07/06/2014   BUN 7 07/05/2014   CREATININE 2.42* 07/07/2014   CREATININE 2.32* 07/06/2014   CREATININE 2.48* 07/05/2014   CALCIUM 10.4 07/07/2014   CALCIUM 9.6 07/06/2014   CALCIUM 9.4 07/05/2014   LFT  Recent Labs  07/05/14 0600 07/07/14 1100  PROT 4.9* 6.5  ALBUMIN 2.6* 3.3*  AST 12 23  ALT 12 14  ALKPHOS 47 17  BILITOT 0.4 0.5  BILIDIR  --  <0.1  IBILI  --  NOT CALCULATED   PT/INR Lab Results  Component Value Date   INR 1.09 06/19/2014   Lipase     Component Value Date/Time   LIPASE 20 07/07/2014 1100    RADIOLOGY STUDIES: Dg Abd Portable 1v  07/07/2014    CLINICAL DATA:  Persistent vomiting.  EXAM: PORTABLE ABDOMEN - 1 VIEW  COMPARISON:  CT 05/16/2014  FINDINGS: Prior cholecystectomy. Nonobstructive bowel gas pattern. No free air organomegaly. No suspicious calcification. No acute bony abnormality.  IMPRESSION: No obstruction or free air.   Electronically Signed   By: Rolm Baptise M.D.   On: 07/07/2014 09:34    ENDOSCOPIC STUDIES: 2015 Colon[facility] in [facility] relays that polyps were removed but otherwise benign study, no active Crohn's.   10/2009  Colonoscopy  for hx Crohn's since 1990 ENDOSCOPIC IMPRESSION:  1) Polyps, multiple in the rectum    2) Polyps, multiple throughout the colon  3) Ulcer in the cecum  4) Normal terminal ileum  5) Mild diverticulosis in the ascending colon  6) Normal rectum  multiple small polyps and pseudopolyps, no evidence of Crohn's  disease in the TI, the only activiti noted in the cecum at the  site of a linear ulceration, ileocecal valve appears normal ( not  deformed) Pathology:  Hyperplastic polyps.  Patchy Chronic active colitis, chronic active ileitis with surface erosion: both most c/w Crohn's.    11/2006  EGD for dysphagia and hx dilation of benign esoph stricture Findings: esophageal spasm vs mild stricture: this was dilated.    IMPRESSION:   *  N/V (intermittent recurrences), CG emesis.  Rule out ulcer, rule out MWT,  r/o gastroparesis Remote NSAID ulcer, EGD of 2008 with benign esophageal stricture/spasm.   *  Hx mild Crohn's ileocolitis. On chronic Sulfasalazine. Previously on Asacol. Per dtr's report a 2015 colonoscopy in [place] did not show active Crohn's.   *  Several recent hospital stays, 2 with n/v dehydration, latest with urosepsis and CVA. She is only on 325 ASA for the stroke. ? Underlying gastroparesis.     PLAN:     *  Oral contrast CT abdomen/pelvis ordered  *  EGD, tomorrow. Azucena Freed  07/07/2014, 1:03 PM Pager:  [phone]       Attending physician's note   I have taken a history, examined the patient and reviewed the chart. I agree with the Advanced Practitioner's note, impression and recommendations. Recurrent N/V with history of Crohn's ileocolitis. Had coffee ground emesis on ASA. Abd/pelvic CT today and EGD tomorrow.   Ladene Artist, MD Marval Regal

## 2014-07-07 NOTE — Progress Notes (Signed)
Patient being transferred to 5W05.  Called report to nurse receiving patient.  Patient is currently very drowsy but stable.  Granddaughter notified.  Will be transferring patient shortly.  Brita Romp, RN

## 2014-07-07 NOTE — Progress Notes (Signed)
ANTIBIOTIC CONSULT NOTE - INITIAL  Pharmacy Consult for cefepime Indication: rule out sepsis  No Known Allergies  Patient Measurements:   Height: 5'6" Weight: 72.5kg  Vital Signs: Temp: 100.2 F (37.9 C) (04/11 1035) Temp Source: Axillary (04/11 1035) BP: 182/72 mmHg (04/11 1035) Pulse Rate: 97 (04/11 0620) Intake/Output from previous day:   Intake/Output from this shift:    Labs:  Recent Labs  07/05/14 0600 07/06/14 0440 07/07/14 0600 07/07/14 1100  WBC  --   --  9.8  --   HGB  --   --  10.5*  --   PLT  --   --  296  --   CREATININE 2.48* 2.32*  --  2.42*   Estimated Creatinine Clearance: 21.4 mL/min (by C-G formula based on Cr of 2.42).  Recent Labs  07/06/14 0555  VANCORANDOM 29.7     Microbiology: Recent Results (from the past 720 hour(s))  Blood Culture (routine x 2)     Status: None   Collection Time: 06/19/14 12:23 PM  Result Value Ref Range Status   Specimen Description BLOOD LEFT ANTECUBITAL  Final   Special Requests BOTTLES DRAWN AEROBIC AND ANAEROBIC 3CC  Final   Culture   Final    NO GROWTH 5 DAYS Performed at [facility]    Report Status 06/25/2014 FINAL  Final  Blood Culture (routine x 2)     Status: None   Collection Time: 06/19/14 12:36 PM  Result Value Ref Range Status   Specimen Description BLOOD LEFT ANTECUBITAL  Final   Special Requests BOTTLES DRAWN AEROBIC AND ANAEROBIC 5ML  Final   Culture   Final    NO GROWTH 5 DAYS Performed at [facility]    Report Status 06/25/2014 FINAL  Final  Urine culture     Status: None   Collection Time: 06/19/14  2:32 PM  Result Value Ref Range Status   Specimen Description URINE, CLEAN CATCH  Final   Special Requests NONE  Final   Colony Count   Final    80,000 COLONIES/ML Performed at [facility]    Culture   Final    ESCHERICHIA COLI Performed at [facility]    Report Status 06/21/2014 FINAL  Final   Organism ID, Bacteria ESCHERICHIA COLI  Final       Susceptibility   Escherichia coli - MIC*    AMPICILLIN >=32 RESISTANT Resistant     CEFAZOLIN >=64 RESISTANT Resistant     CEFTRIAXONE <=1 SENSITIVE Sensitive     CIPROFLOXACIN >=4 RESISTANT Resistant     GENTAMICIN <=1 SENSITIVE Sensitive     LEVOFLOXACIN >=8 RESISTANT Resistant     NITROFURANTOIN <=16 SENSITIVE Sensitive     TOBRAMYCIN <=1 SENSITIVE Sensitive     TRIMETH/SULFA >=320 RESISTANT Resistant     PIP/TAZO >=128 RESISTANT Resistant     * ESCHERICHIA COLI  MRSA PCR Screening     Status: None   Collection Time: 06/19/14  8:30 PM  Result Value Ref Range Status   MRSA by PCR NEGATIVE NEGATIVE Final    Comment:        The GeneXpert MRSA Assay (FDA approved for NASAL specimens only), is one component of a comprehensive MRSA colonization surveillance program. It is not intended to diagnose MRSA infection nor to guide or monitor treatment for MRSA infections.   CSF culture     Status: None   Collection Time: 06/20/14 11:33 AM  Result Value Ref Range Status   Specimen  Description CSF  Final   Special Requests NONE  Final   Gram Stain   Final    FEW WBC PRESENT, PREDOMINANTLY PMN NO ORGANISMS SEEN Gram Stain Report Called to,Read Back By and Verified With: Gram Stain Report Called to,Read Back By and Verified With: ALLISON DUNDON [id number]@1 :00AM BJENN Performed at [facility]    Culture   Final    NO GROWTH 3 DAYS Performed at [facility]    Report Status 06/24/2014 FINAL  Final  Gram stain     Status: None   Collection Time: 06/20/14 11:33 AM  Result Value Ref Range Status   Specimen Description CSF  Final   Special Requests NONE  Final   Gram Stain   Final    CYTOSPIN PREP WBC PRESENT, PREDOMINANTLY PMN NO ORGANISMS SEEN    Report Status 06/20/2014 FINAL  Final  Culture, blood (routine x 2)     Status: None   Collection Time: 06/24/14  6:44 PM  Result Value Ref Range Status   Specimen Description BLOOD LEFT HAND  Final   Special Requests  BOTTLES DRAWN AEROBIC ONLY 3CC  Final   Culture   Final    NO GROWTH 5 DAYS Performed at [facility]    Report Status 07/01/2014 FINAL  Final  Culture, blood (routine x 2)     Status: None   Collection Time: 06/24/14  6:53 PM  Result Value Ref Range Status   Specimen Description BLOOD LEFT HAND  Final   Special Requests BOTTLES DRAWN AEROBIC AND ANAEROBIC 5CC  Final   Culture   Final    NO GROWTH 5 DAYS Performed at [facility]    Report Status 07/01/2014 FINAL  Final  Culture, Urine     Status: None   Collection Time: 06/24/14  7:50 PM  Result Value Ref Range Status   Specimen Description URINE, RANDOM  Final   Special Requests Normal  Final   Colony Count NO GROWTH Performed at [facility]   Final   Culture NO GROWTH Performed at [facility]   Final   Report Status 06/26/2014 FINAL  Final  Clostridium Difficile by PCR     Status: None   Collection Time: 06/26/14  8:19 PM  Result Value Ref Range Status   C difficile by pcr NEGATIVE NEGATIVE Final    Assessment: 87 YOF transferred back from [facility] after being there from 4/1-4/11 for rehab following CVA. She is readmitted with increased lethargy, N/V, and concern for sepsis and GIB.  She completed a 14 day course of CTX/vancomycin for concern of meningitis. She developed AKI and had an elevated vancomycin trough. Last dose of vancomycin given on 4/9, however her trough was elevated on 4/10 which means she likely still has some vancomycin in her system. 4/11 (today) set to be last day of abx for meningitis.  CTX 3/24>>3/27; 3/28>>4/11 Vanc 3/25>>4/11 (last dose given 4/8) Flagyl 4/11>> Cefepime 4/11>>  SCr 2.42 today, est CrCl ~20-45m/min. WBC nml, Tmax 100.2. Blood cultures have been sent  Goal of Therapy:  proper dosing based on hepatic and renal function  Plan:  -cefepime 1g IV q24h -Flagyl 501mIV q8h per MD (proper dosing) -follow c/s, clinical progression, renal  function  Mylik Pro D. Odell Choung, PharmD, BCPS Clinical Pharmacist Pager: [phone]/01/2015 12:54 PM

## 2014-07-07 NOTE — H&P (Signed)
[facility] History and Physical  Dorreen Valiente BPZ:[id number] DOB: [birth year] DOA: 07/07/2014  Referring physician:  Alysia Penna PCP:  Hermelinda Medicus, MD   Chief Complaint:  Intractable nausea, vomiting and hematemesis  HPI:  The patient is a 74 y.o. 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 She had admissions in February and March of this year for gastroenteritis symptoms (n/v/d). She returned to the hospital on 06/19/14 with fever, altered mental status, and severe sepsis. There was concern for meningitis. She was found to have a UTI and a CVA. She was evaluated by neurology and infectious disease, stabilized and was transferred to [place] on 06/27/2014 on vancomycin and ceftriaxone. Infectious Disease continued to follow her in [facility]. On 4/8, she was found to have acute renal failure and a vancomycin trough level of 47. She was also on lisinopril and spironolactone. She was also noted to be bradycardic.  She was started on IV fluids, her ACE inhibitor and spironolactone were discontinued, and she had a mild improvement in her kidney function. For bradycardia, her clonidine was discontinued and she was started on hydralazine. On 4/10 into 4/11, she developed epigastric pain with intractable nausea and vomiting. She had bilious and bloody emesis.  She is being readmitted for further evaluation.  Labs are concerning for a positive gap metabolic acidosis, rising creatinine, decreasing hemoglobin. She has developed a low-grade fever with tachycardia. Her blood pressure is elevated.    This morning, the patient stated that she felt very tired and weak. She endorsed moderate constant right upper quadrant and epigastric pain.  She is status post cholecystectomy. She continued to have nausea despite multiple doses of Zofran and Phenergan, and almost  vomited during my interview with her. She stated she had yellow to green emesis, and had been unable to keep down any liquids. She also had some bloody emesis with fairly bright colored blood overnight which had gotten a little bit better this morning.  She denied diarrhea.  As above, she has been on a long course of antibiotics for meningitis and is at risk for C. difficile colitis. She also has history of Crohn's disease. On a CAT scan from 2 months ago, she was incidentally noted to have some sort of mass posterior to her bladder which has so far not been followed up.    Review of Systems:  General:  + fevers, chills, denies weight loss or gain HEENT:  Denies changes to hearing and vision, rhinorrhea, sinus congestion, sore throat CV:  Denies chest pain and palpitations, positive lower extremity edema.  PULM:  Denies SOB, wheezing, cough.   GI:  Per history of present illness GU:  Denies dysuria, frequency, urgency ENDO:  Denies polyuria, polydipsia.   HEME:  Denies blood in stools, melena, abnormal bruising or bleeding.  LYMPH:  Denies lymphadenopathy.   MSK:  Denies arthralgias, myalgias.   DERM:  Denies skin rash or ulcer.   NEURO:  Denies new focal numbness, weakness, slurred speech, confusion, facial droop.  She continues to have some baseline confusion.   PSYCH:  Denies anxiety and depression.    Past Medical History  Diagnosis Date  . CHF (congestive heart failure)   . Hypertension   . Back pain, chronic   . Hip pain, chronic   . GERD (gastroesophageal reflux disease)   . Crohn disease   . Diabetes mellitus without complication   . Esophageal  stricture     s/p dilation 2003, 2008  . PUD (peptic ulcer disease)   . CKD (chronic kidney disease)   . COPD (chronic obstructive pulmonary disease)    Past Surgical History  Procedure Laterality Date  . Abdominal hysterectomy    . Tonsillectomy    . Cholecystectomy    . Bladder surgery     Social History:  reports that she has  been smoking.  She has never used smokeless tobacco. She reports that she does not drink alcohol or use illicit drugs. She has been hospitalized for almost a month. Most recently in [facility].  No Known Allergies  Family History  Problem Relation Age of Onset  . Diabetes Mellitus II Other      Prior to Admission medications   Medication Sig Start Date End Date Taking? Authorizing Provider  bisacodyl (DULCOLAX) 10 MG suppository Place 1 suppository (10 mg total) rectally daily as needed for moderate constipation. 06/27/14   Donne Hazel, MD  cefTRIAXone (ROCEPHIN) 40 MG/ML IVPB Inject 50 mLs (2 g total) into the vein every 12 (twelve) hours. 06/27/14   Donne Hazel, MD  Cholecalciferol (VITAMIN D) 2000 UNITS CAPS Take 1 capsule by mouth daily.    Historical Provider, MD  folic acid (FOLVITE) 1 MG tablet Take 1 mg by mouth daily.    Historical Provider, MD  omeprazole (PRILOSEC) 20 MG capsule Take 20 mg by mouth every morning.     Historical Provider, MD  ondansetron (ZOFRAN) 4 MG tablet Take 1 tablet (4 mg total) by mouth every 6 (six) hours as needed for nausea. 06/16/14   Velvet Bathe, MD  PROAIR HFA 108 (90 BASE) MCG/ACT inhaler Inhale 1 puff into the lungs every 6 (six) hours as needed. Shortness of breath or wheezing 03/03/14   Historical Provider, MD   Physical Exam: There were no vitals filed for this visit.   General:  Average weight female, no acute distress, ill appearing lying in bed  Eyes:  PERRL, anicteric, non-injected.  ENT:  Nares clear.  OP clear, non-erythematous without plaques or exudates.  MMM.  Neck:  Supple without TM or JVD.    Lymph:  No cervical, supraclavicular, or submandibular LAD.  Cardiovascular:  RRR, normal S1, S2, 3-6 systolic murmur at the right upper sternal border.  2+ pulses, cool extremities  Respiratory:  Faint rales at the bilateral bases without wheezes, rhonchi, without increased WOB.  Abdomen:  Hyperactive.  Soft, mildly distended, tender to  palpation in the right upper quadrant and epigastric area.  Positive Murphy sign    Skin:  No rashes or focal lesions.  Musculoskeletal:  Normal bulk and tone.  1+ pitting bilateral LE edema.  Psychiatric:  Alert  Appropriate affect.  Neurologic:  CN II-XII grossly intact, strength grossly intact, sensation intact to light touch.    Labs on Admission:  Basic Metabolic Panel:  Recent Labs Lab 07/04/14 0920 07/04/14 1555 07/05/14 0600 07/06/14 0440 07/07/14 1100  NA 142  --  139 138 143  K 3.6  --  3.7 3.5 4.3  CL 111  --  110 108 108  CO2 23  --  25 20 18*  GLUCOSE 192*  --  161* 154* 212*  BUN 7  --  7 9 9   CREATININE 2.49*  --  2.48* 2.32* 2.42*  CALCIUM 9.8  --  9.4 9.6 10.4  MG  --  1.6  --   --   --    Liver Function  Tests:  Recent Labs Lab 07/05/14 0600 07/07/14 1100  AST 12 23  ALT 12 14  ALKPHOS 47 55  BILITOT 0.4 0.5  PROT 4.9* 6.5  ALBUMIN 2.6* 3.3*    Recent Labs Lab 07/07/14 1100  LIPASE 20   No results for input(s): AMMONIA in the last 168 hours. CBC:  Recent Labs Lab 07/04/14 0920 07/07/14 0600  WBC 8.0 9.8  HGB 9.4* 10.5*  HCT 27.7* 30.2*  MCV 89.1 87.0  PLT 230 296   Cardiac Enzymes: No results for input(s): CKTOTAL, CKMB, CKMBINDEX, TROPONINI in the last 168 hours.  BNP (last 3 results) No results for input(s): BNP in the last 8760 hours.  ProBNP (last 3 results)  Recent Labs  08/27/13 2205 08/28/13 0454  PROBNP 263.1* 251.0*    CBG:  Recent Labs Lab 07/06/14 1141 07/06/14 1647 07/06/14 2050 07/07/14 0634 07/07/14 1127  GLUCAP 124* 141* 156* 185* 211*    Radiological Exams on Admission: Dg Abd Portable 1v  07/07/2014   CLINICAL DATA:  Persistent vomiting.  EXAM: PORTABLE ABDOMEN - 1 VIEW  COMPARISON:  CT 05/16/2014  FINDINGS: Prior cholecystectomy. Nonobstructive bowel gas pattern. No free air organomegaly. No suspicious calcification. No acute bony abnormality.  IMPRESSION: No obstruction or free air.    Electronically Signed   By: Rolm Baptise M.D.   On: 07/07/2014 09:34    EKG: Independently reviewed.  Sinus pericardia without acute ischemic changes  Assessment/Plan Active Problems:   Crohn disease   Accelerated hypertension   AKI (acute kidney injury)   Pelvic mass   Diastolic CHF   Metabolic acidosis   Sepsis   CVA (cerebral infarction)   Thrombotic cerebral infarction   Bacterial meningitis   Bradycardia   Hematemesis with nausea  ---  Intractable nausea and vomiting with hematemesis, KUB without obstruction or evidence of free air, patient is status post cholecystectomy.  She has had two previous admissions with "gastroenteritis" in the last few months.  History of crohn's and possible mass near bladder.  Multiple abdominal wall hernias.   -  Admit to telemetry -  Judicious use of IV fluids -  Antiemetics -  Nothing by mouth -  PPI infusion -  GI consultation -  Noncontrast CT abdomen and pelvis -  Consider NG tube -  Repeat H&H  Sepsis while on antibiotics and in the setting of recent meningitis. Evaluated during previous admission by Infectious Disease -  Would have completed a 2 week course of Rocephin today  -  Vancomycin recently discontinued due to AKI -  Check Vancomycin level -  DC ceftriaxone and start cefepime plus Flagyl -  Blood cultures, repeat urinalysis and urine culture, chest x-ray -  Lactic acid, procalcitonin -  Enteric precautions in case she has C. difficile and just has not had diarrhea yet -  Send C. difficile PCR she has diarrhea  Acute renal failure, likely secondary to vancomycin and dehydration in the setting of ACEI use. Baseline creatinine 0.89. Creatinine peak 2.49 on 4/8.  Trending up again today. - Continue to hold lisinopril and spironolactone - Vancomycin level - Nephrology consultation  +gap metabolic acidosis in setting of sepsis and abdominal pain concerning for intraabdominal process -  Stat lactic acid -  IVF -   Trend CO2  Sinus bradycardia, likely side effect of clonidine and resolved - ECG: Sinus brady - TSH 0.491 - Stopped clonidine on 4/9 - Avoid BB and AV nodal blocking medications   Hypertension, BP elevated likely  due to stress from vomiting - Holding spironolactone and lisinopril due to AKI - Stopped clonidine due to bradycardia - schedule hydralazine  Chronic diastolic heart failure, appears hypervolemic without acute on chronic exacerbation - Can give lasix 7m IV once if she develops shortness of breath  Recent thrombotic cerebral infarction -  Hold Aspirin 325 mg due to GIB  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  Hemoglobin a1c was 5.3 - start low dose sliding scale insulin.  Normocytic anemia, likely due to acute illness and AKI, no obvious bleeding but has history of iron deficiency - Occult stool not obtained - Iron studies wnl -  Vit b12 low -  Start vitamin b12 supplementation -  Folate 6.1 - TSH wnl  Decreased appetite. Consider resuming Megace when tolerating PO  Diet:  NPO Access:  PICC IVF:  yes Proph:  SCD  Code Status: full Family Communication: patient alone Disposition Plan: Admit to telemetry  Time spent: 60 min SJanece Canterbury[facility] Pager [phone] If 7PM-7AM, please contact night-coverage [email or web address] Password [email or web address] 07/07/2014, 1:13 PM

## 2014-07-07 NOTE — Progress Notes (Signed)
Subjective/Complaints: Gastrocult + emesis last noc, appreciate hospitalist note ROS limited by cognition  Objective: Vital Signs: Blood pressure 201/67, pulse 97, temperature 99 F (37.2 C), temperature source Oral, resp. rate 19, weight 72.5 kg (159 lb 13.3 oz), SpO2 100 %. No results found. Results for orders placed or performed during the hospital encounter of 06/27/14 (from the past 72 hour(s))  Basic metabolic panel     Status: Abnormal   Collection Time: 07/04/14  9:20 AM  Result Value Ref Range   Sodium 142 135 - 145 mmol/L   Potassium 3.6 3.5 - 5.1 mmol/L   Chloride 111 96 - 112 mmol/L   CO2 23 19 - 32 mmol/L   Glucose, Bld 192 (H) 70 - 99 mg/dL   BUN 7 6 - 23 mg/dL   Creatinine, Ser 2.49 (H) 0.50 - 1.10 mg/dL   Calcium 9.8 8.4 - 10.5 mg/dL   GFR calc non Af Amer 18 (L) >90 mL/min   GFR calc Af Amer 21 (L) >90 mL/min    Comment: (NOTE) The eGFR has been calculated using the CKD EPI equation. This calculation has not been validated in all clinical situations. eGFR's persistently <90 mL/min signify possible Chronic Kidney Disease.    Anion gap 8 5 - 15  CBC     Status: Abnormal   Collection Time: 07/04/14  9:20 AM  Result Value Ref Range   WBC 8.0 4.0 - 10.5 K/uL   RBC 3.11 (L) 3.87 - 5.11 MIL/uL   Hemoglobin 9.4 (L) 12.0 - 15.0 g/dL   HCT 27.7 (L) 36.0 - 46.0 %   MCV 89.1 78.0 - 100.0 fL   MCH 30.2 26.0 - 34.0 pg   MCHC 33.9 30.0 - 36.0 g/dL   RDW 15.8 (H) 11.5 - 15.5 %   Platelets 230 150 - 400 K/uL  Glucose, capillary     Status: Abnormal   Collection Time: 07/04/14 12:15 PM  Result Value Ref Range   Glucose-Capillary 125 (H) 70 - 99 mg/dL   Comment 1 Notify RN   Vancomycin, trough     Status: Abnormal   Collection Time: 07/04/14 12:40 PM  Result Value Ref Range   Vancomycin Tr 47.8 (HH) 10.0 - 20.0 ug/mL    Comment: CRITICAL RESULT CALLED TO, READ BACK BY AND VERIFIED WITH: Erling Conte RN 07/04/14 1420 COSTELLO B REPEATED TO VERIFY   Magnesium      Status: None   Collection Time: 07/04/14  3:55 PM  Result Value Ref Range   Magnesium 1.6 1.5 - 2.5 mg/dL  Glucose, capillary     Status: Abnormal   Collection Time: 07/04/14  4:38 PM  Result Value Ref Range   Glucose-Capillary 142 (H) 70 - 99 mg/dL  Glucose, capillary     Status: Abnormal   Collection Time: 07/04/14  8:58 PM  Result Value Ref Range   Glucose-Capillary 145 (H) 70 - 99 mg/dL   Comment 1 Notify RN   Urinalysis, Routine w reflex microscopic     Status: Abnormal   Collection Time: 07/05/14  3:26 AM  Result Value Ref Range   Color, Urine YELLOW YELLOW   APPearance CLOUDY (A) CLEAR   Specific Gravity, Urine 1.005 1.005 - 1.030   pH 6.0 5.0 - 8.0   Glucose, UA NEGATIVE NEGATIVE mg/dL   Hgb urine dipstick NEGATIVE NEGATIVE   Bilirubin Urine NEGATIVE NEGATIVE   Ketones, ur NEGATIVE NEGATIVE mg/dL   Protein, ur NEGATIVE NEGATIVE mg/dL   Urobilinogen, UA 0.2  0.0 - 1.0 mg/dL   Nitrite NEGATIVE NEGATIVE   Leukocytes, UA NEGATIVE NEGATIVE    Comment: MICROSCOPIC NOT DONE ON URINES WITH NEGATIVE PROTEIN, BLOOD, LEUKOCYTES, NITRITE, OR GLUCOSE <1000 mg/dL.  Comprehensive metabolic panel     Status: Abnormal   Collection Time: 07/05/14  6:00 AM  Result Value Ref Range   Sodium 139 135 - 145 mmol/L   Potassium 3.7 3.5 - 5.1 mmol/L   Chloride 110 96 - 112 mmol/L   CO2 25 19 - 32 mmol/L   Glucose, Bld 161 (H) 70 - 99 mg/dL   BUN 7 6 - 23 mg/dL   Creatinine, Ser 2.48 (H) 0.50 - 1.10 mg/dL   Calcium 9.4 8.4 - 10.5 mg/dL   Total Protein 4.9 (L) 6.0 - 8.3 g/dL   Albumin 2.6 (L) 3.5 - 5.2 g/dL   AST 12 0 - 37 U/L   ALT 12 0 - 35 U/L   Alkaline Phosphatase 47 39 - 117 U/L   Total Bilirubin 0.4 0.3 - 1.2 mg/dL   GFR calc non Af Amer 18 (L) >90 mL/min   GFR calc Af Amer 21 (L) >90 mL/min    Comment: (NOTE) The eGFR has been calculated using the CKD EPI equation. This calculation has not been validated in all clinical situations. eGFR's persistently <90 mL/min signify possible  Chronic Kidney Disease.    Anion gap 4 (L) 5 - 15  TSH     Status: None   Collection Time: 07/05/14  6:00 AM  Result Value Ref Range   TSH 0.491 0.350 - 4.500 uIU/mL  Glucose, capillary     Status: Abnormal   Collection Time: 07/05/14  6:23 AM  Result Value Ref Range   Glucose-Capillary 147 (H) 70 - 99 mg/dL   Comment 1 Notify RN   Glucose, capillary     Status: Abnormal   Collection Time: 07/05/14 11:42 AM  Result Value Ref Range   Glucose-Capillary 143 (H) 70 - 99 mg/dL   Comment 1 Notify RN   Glucose, capillary     Status: Abnormal   Collection Time: 07/05/14  4:34 PM  Result Value Ref Range   Glucose-Capillary 128 (H) 70 - 99 mg/dL   Comment 1 Notify RN   Glucose, capillary     Status: Abnormal   Collection Time: 07/05/14  9:52 PM  Result Value Ref Range   Glucose-Capillary 160 (H) 70 - 99 mg/dL  Basic metabolic panel     Status: Abnormal   Collection Time: 07/06/14  4:40 AM  Result Value Ref Range   Sodium 138 135 - 145 mmol/L   Potassium 3.5 3.5 - 5.1 mmol/L   Chloride 108 96 - 112 mmol/L   CO2 20 19 - 32 mmol/L   Glucose, Bld 154 (H) 70 - 99 mg/dL   BUN 9 6 - 23 mg/dL   Creatinine, Ser 2.32 (H) 0.50 - 1.10 mg/dL   Calcium 9.6 8.4 - 10.5 mg/dL   GFR calc non Af Amer 20 (L) >90 mL/min   GFR calc Af Amer 23 (L) >90 mL/min    Comment: (NOTE) The eGFR has been calculated using the CKD EPI equation. This calculation has not been validated in all clinical situations. eGFR's persistently <90 mL/min signify possible Chronic Kidney Disease.    Anion gap 10 5 - 15  Iron and TIBC     Status: Abnormal   Collection Time: 07/06/14  4:40 AM  Result Value Ref Range   Iron 48 42 -  145 ug/dL   TIBC 176 (L) 250 - 470 ug/dL   Saturation Ratios 27 20 - 55 %   UIBC 128 125 - 400 ug/dL    Comment: Performed at [facility]  Ferritin     Status: None   Collection Time: 07/06/14  4:40 AM  Result Value Ref Range   Ferritin 186 10 - 291 ng/mL    Comment: Performed at  [facility]  Vitamin B12     Status: None   Collection Time: 07/06/14  4:40 AM  Result Value Ref Range   Vitamin B-12 229 211 - 911 pg/mL    Comment: Performed at [facility]  Folate     Status: None   Collection Time: 07/06/14  4:40 AM  Result Value Ref Range   Folate 6.1 ng/mL    Comment: (NOTE) Reference Ranges        Deficient:       0.4 - 3.3 ng/mL        Indeterminate:   3.4 - 5.4 ng/mL        Normal:              > 5.4 ng/mL Performed at [facility]   Vancomycin, random     Status: None   Collection Time: 07/06/14  5:55 AM  Result Value Ref Range   Vancomycin Rm 29.7 ug/mL    Comment:        Random Vancomycin therapeutic range is dependent on dosage and time of specimen collection. A peak range is 20.0-40.0 ug/mL A trough range is 5.0-15.0 ug/mL          Glucose, capillary     Status: Abnormal   Collection Time: 07/06/14  7:04 AM  Result Value Ref Range   Glucose-Capillary 133 (H) 70 - 99 mg/dL  Glucose, capillary     Status: Abnormal   Collection Time: 07/06/14 11:41 AM  Result Value Ref Range   Glucose-Capillary 124 (H) 70 - 99 mg/dL   Comment 1 Notify RN   Glucose, capillary     Status: Abnormal   Collection Time: 07/06/14  4:47 PM  Result Value Ref Range   Glucose-Capillary 141 (H) 70 - 99 mg/dL   Comment 1 Notify RN   Glucose, capillary     Status: Abnormal   Collection Time: 07/06/14  8:50 PM  Result Value Ref Range   Glucose-Capillary 156 (H) 70 - 99 mg/dL   Comment 1 Notify RN   Occult blood gastric / duodenum     Status: Abnormal   Collection Time: 07/07/14  1:09 AM  Result Value Ref Range   pH, Gastric NOT DONE    Occult Blood, Gastric POSITIVE (A) NEGATIVE  CBC     Status: Abnormal   Collection Time: 07/07/14  6:00 AM  Result Value Ref Range   WBC 9.8 4.0 - 10.5 K/uL   RBC 3.47 (L) 3.87 - 5.11 MIL/uL   Hemoglobin 10.5 (L) 12.0 - 15.0 g/dL   HCT 30.2 (L) 36.0 - 46.0 %   MCV 87.0 78.0 - 100.0 fL   MCH 30.3 26.0 -  34.0 pg   MCHC 34.8 30.0 - 36.0 g/dL   RDW 15.5 11.5 - 15.5 %   Platelets 296 150 - 400 K/uL  Glucose, capillary     Status: Abnormal   Collection Time: 07/07/14  6:34 AM  Result Value Ref Range   Glucose-Capillary 185 (H) 70 - 99 mg/dL   Comment 1 Notify  RN      HEENT: edentulous Cardio: RRR and no murmur Resp: CTA B/L and unlabored GI: BS positive and epigastric tenderness Extremity:  Pulses positive and No Edema Skin:   Intact Neuro: Confused,poorly responsive Flat, Abnormal Sensory difficult to assess due to cognition, Abnormal Motor 4/5 in BUE and BLE and Abnormal FMC Ataxic/ dec FMC Musc/Skel:  Other no pain with BUE and BLE ROM Gen Lethargic, opens eyes briefly   Assessment/Plan: 1. Functional deficits secondary to  Left medial thalamic infarct thrombosis SVD which require 3+ hours per day of interdisciplinary therapy in a comprehensive inpatient rehab setting. Physiatrist is providing close team supervision and 24 hour management of active medical problems listed below. Physiatrist and rehab team continue to assess barriers to discharge/monitor patient progress toward functional and medical goals. Pt less responsive today , do not think she can tolerate therapy  May need acute transfer, [facility] to assess today Will not be able to d/c tomorrow FIM: FIM - Bathing Bathing Steps Patient Completed: Chest, Right Arm, Left Arm, Abdomen, Front perineal area, Buttocks, Right upper leg, Left upper leg, Right lower leg (including foot), Left lower leg (including foot) Bathing: 5: Set-up assist to: Obtain items  FIM - Upper Body Dressing/Undressing Upper body dressing/undressing steps patient completed: Thread/unthread right sleeve of pullover shirt/dresss, Thread/unthread left sleeve of pullover shirt/dress, Put head through opening of pull over shirt/dress, Pull shirt over trunk Upper body dressing/undressing: 4: Min-Patient completed 75 plus % of tasks FIM - Lower Body  Dressing/Undressing Lower body dressing/undressing steps patient completed: Pull pants up/down, Thread/unthread right pants leg, Thread/unthread left pants leg, Don/Doff right sock, Don/Doff left sock Lower body dressing/undressing: 4: Min-Patient completed 75 plus % of tasks  FIM - Toileting Toileting steps completed by patient: Adjust clothing prior to toileting, Performs perineal hygiene, Adjust clothing after toileting Toileting Assistive Devices: Grab bar or rail for support Toileting: 4: Steadying assist  FIM - Air cabin crew Transfers: 5-To toilet/BSC: Supervision (verbal cues/safety issues), 5-From toilet/BSC: Supervision (verbal cues/safety issues)  FIM - Control and instrumentation engineer Devices: HOB elevated, Bed rails, Walker Bed/Chair Transfer: 5: Supine > Sit: Supervision (verbal cues/safety issues), 5: Chair or W/C > Bed: Supervision (verbal cues/safety issues), 5: Bed > Chair or W/C: Supervision (verbal cues/safety issues), 5: Sit > Supine: Supervision (verbal cues/safety issues)  FIM - Locomotion: Wheelchair Distance: 0', pt still unable to follow commands to sequence w/c mobility at this time Locomotion: Wheelchair: 0: Activity did not occur (Pt ambulatory) FIM - Locomotion: Ambulation Locomotion: Ambulation Assistive Devices: Administrator Ambulation/Gait Assistance: 4: Min guard, 5: Supervision Locomotion: Ambulation: 4: Travels 150 ft or more with minimal assistance (Pt.>75%)  Comprehension Comprehension Mode: Auditory Comprehension: 3-Understands basic 50 - 74% of the time/requires cueing 25 - 50%  of the time  Expression Expression Mode: Verbal Expression: 2-Expresses basic 25 - 49% of the time/requires cueing 50 - 75% of the time. Uses single words/gestures.  Social Interaction Social Interaction: 2-Interacts appropriately 25 - 49% of time - Needs frequent redirection.  Problem Solving Problem Solving Mode: Not assessed Problem  Solving: 2-Solves basic 25 - 49% of the time - needs direction more than half the time to initiate, plan or complete simple activities  Memory Memory Mode: Not assessed Memory: 2-Recognizes or recalls 25 - 49% of the time/requires cueing 51 - 75% of the time  Medical Problem List and Plan: 1. Functional deficits secondary toleft medial thalamic infarct 2. DVT Prophylaxis/Anticoagulation: Pharmaceutical: Lovenox 3. Pain Management: tylenol  prn for pain 4. Mood: LCSW to follow for evaluation and support as mentation improves. 5. Neuropsych: This patient is not capable of making decisions on her own behalf. 6. Skin/Wound Care: Routine pressure relief measures.  7. Fluids/Electrolytes/Nutrition: Monitor I/O. Dehydration , diuretic use 8. HTN: monitor BP every 8 hours. Continue lisinopril, aldactone and catapres. 9. Fevers: Continue high dose ceftriaxone for two weeks for presumed meningitis.  -also with recent UTI however last ucx neg, blood culture pending  10. ABLA?:stable H and H 10.5  .  Monitor for signs of bleeding.  11. Crohn's disease: Off azulfidine at this time. No complaints of abdominal distress/diarrhea 12. DM type 2: Monitor BS with achs checks. Off Januvia due to poor intake. Will use SSI for now and resume oral meds as indicated 13. CKD: worsening once again, multifactorial ACE inhibitor, vancomycin or poor intake. appreciate internal medicine consultation, given multiple medical issues. 14.  GI bleed, ? Etiology, ?stress ulcer/,KUB pending, Hgb stable    LOS (Days) 10 A FACE TO FACE EVALUATION WAS PERFORMED  KIRSTEINS,ANDREW E 07/07/2014, 9:06 AM

## 2014-07-07 NOTE — Progress Notes (Signed)
Occupational Therapy Session Note  Patient Details  Name: Michelle Farrell MRN: [id number] Date of Birth: Jan 09, 1941  Today's Date: 07/07/2014 OT Individual Time:   Short Term Goals: Week 1:  OT Short Term Goal 1 (Week 1): STGs = LTGs due to short LOS  Skilled Therapeutic Interventions/Progress Updates: No treatment provided d/t discharge from unit and transfer to [facility] of [facility].     Therapy Documentation Precautions:  Precautions Precautions: Fall Precaution Comments: Impulsive, confused Restrictions Weight Bearing Restrictions: No   Vital Signs: Therapy Vitals Temp: 99 F (37.2 C) Temp Source: Oral Pulse Rate: 97 Resp: 19 BP: (!) 197/69 mmHg Patient Position (if appropriate): Lying Oxygen Therapy SpO2: 100 % O2 Device: Not Delivered   ADL: ADL Grooming: Minimal cueing Where Assessed-Grooming: Standing at sink Upper Body Bathing: Moderate cueing Where Assessed-Upper Body Bathing: Standing at sink Lower Body Bathing: Moderate cueing Where Assessed-Lower Body Bathing: Standing at sink Upper Body Dressing: Not assessed (gown for PICC line) Where Assessed-Upper Body Dressing: Wheelchair Lower Body Dressing: Minimal cueing Where Assessed-Lower Body Dressing: Standing at sink Toileting: Moderate cueing Where Assessed-Toileting: Glass blower/designer: Close supervision Toilet Transfer Method: Ambulating   See FIM for current functional status  Therapy/Group: Individual Therapy  Faheem Ziemann 07/07/2014, 6:54 AM

## 2014-07-07 NOTE — Progress Notes (Signed)
Social Work Patient ID: Michelle Farrell, female   DOB: May 09, 1940, 74 y.o.   MRN: [id number] Spoke with MD who reports pt is having medical issues and to cancel discharge for tomorrow.

## 2014-07-07 NOTE — Consult Note (Signed)
The patient is a 74 y.o. year-old female with history of pmh of heart failure, diabetes mellitus, chronic hip and back and leg pain, hypertension, and well-controlled Crohn's disease. She had admissions in February and March of this year for gastroenteritis symptoms (n/v/d). She returned to the hospital on 06/19/14 with fever, altered mental status, and severe sepsis. There was concern for meningitis. She was found to have a UTI and a CVA. She was evaluated by neurology and infectious disease, stabilized and was transferred to [facility] on 06/27/2014 on vancomycin and ceftriaxone. On 4/8, she was found to have acute renal failure with creat of 2.49 mg/dl. Creat was 0.80 on 4/1.  Vancomycin trough level was 47 on 4/11. She was also on lisinopril and spironolactone. She was also noted to be bradycardic. She was started on IV fluids, her ACE inhibitor and spironolactone were discontinued.  On 4/10 into 4/11, she developed epigastric pain with intractable nausea and vomiting. She had bilious and bloody emesis and was transferred back to the acute hospital.  Past Medical History  Diagnosis Date  . CHF (congestive heart failure)   . Hypertension   . Back pain, chronic   . Hip pain, chronic   . GERD (gastroesophageal reflux disease)   . Crohn disease   . Diabetes mellitus without complication   . Esophageal stricture     s/p dilation 2003, 2008  . PUD (peptic ulcer disease)   . CKD (chronic kidney disease)   . COPD (chronic obstructive pulmonary disease)    Past Surgical History  Procedure Laterality Date  . Abdominal hysterectomy    . Tonsillectomy    . Cholecystectomy    . Bladder surgery     Social History:  reports that she has been smoking.  She has never used smokeless tobacco. She reports that she does not drink alcohol or use illicit drugs. Allergies: No Known Allergies Family History  Problem Relation Age of Onset  . Diabetes Mellitus II Other     Medications:   Prior to Admission:  Prescriptions prior to admission  Medication Sig Dispense Refill Last Dose  . Cholecalciferol (VITAMIN D) 2000 UNITS CAPS Take 1 capsule by mouth daily.   Past Week at [employer]  . folic acid (FOLVITE) 1 MG tablet Take 1 mg by mouth daily.   Past Week at [employer]  . HYDROcodone-acetaminophen (NORCO) 10-325 MG per tablet Take 1 tablet by mouth every 6 (six) hours as needed for moderate pain.   Past Month at [facility]  . olopatadine (PATANOL) 0.1 % ophthalmic solution Place 1 drop into both eyes 2 (two) times daily.   Past Month at [facility]  . omeprazole (PRILOSEC) 20 MG capsule Take 20 mg by mouth every morning.    Past Week at [employer]  . ondansetron (ZOFRAN) 4 MG tablet Take 1 tablet (4 mg total) by mouth every 6 (six) hours as needed for nausea. 20 tablet 0 unk at [facility]  . PROAIR HFA 108 (90 BASE) MCG/ACT inhaler Inhale 1 puff into the lungs every 6 (six) hours as needed. Shortness of breath or wheezing  3 Past Month at unk  . sitaGLIPtin (JANUVIA) 100 MG tablet Take 100 mg by mouth daily.   Past Week at [employer]  . traZODone (DESYREL) 100 MG tablet Take 100 mg by mouth at bedtime.   Past Week at [facility]   Scheduled: . ceFEPime (MAXIPIME) IV  1 g Intravenous Q24H  . hydrALAZINE  10 mg Intravenous Q6H  . insulin aspart  0-9 Units Subcutaneous 4 times per day  . metronidazole  500 mg Intravenous Q8H  . pantoprazole (PROTONIX) IV  40 mg Intravenous Q12H  . sodium chloride  1,000 mL Intravenous Once  . sodium chloride  10-40 mL Intracatheter Q12H  . sodium chloride  3 mL Intravenous Q12H   ROS: not able to give clear  responses to questions due to some altered mental status Blood pressure 183/57, pulse 82, temperature 99.4 F (37.4 C), temperature source Axillary, resp. rate 19, SpO2 96 %.  General appearance: slow and uncertain and minimal repsonses Head: Normocephalic, without obvious abnormality, atraumatic Throat: lips, mucosa, and tongue normal; teeth and gums normal Resp: clear to auscultation  bilaterally Chest wall: no tenderness Cardio: regular rate and rhythm, S1, S2 normal, no murmur, click, rub or gallop GI: soft, non-tender; bowel sounds normal; no masses,  no organomegaly Extremities: edema 1 Skin: Skin color, texture, turgor normal. No rashes or lesions Neurologic lethargic, confused possibly, unable to answer simple questions Results for orders placed or performed during the hospital encounter of 07/07/14 (from the past 48 hour(s))  Glucose, capillary     Status: Abnormal   Collection Time: 07/07/14 11:27 AM  Result Value Ref Range   Glucose-Capillary 211 (H) 70 - 99 mg/dL  CBC with Differential     Status: Abnormal   Collection Time: 07/07/14  1:10 PM  Result Value Ref Range   WBC 10.4 4.0 - 10.5 K/uL   RBC 3.45 (L) 3.87 - 5.11 MIL/uL   Hemoglobin 10.4 (L) 12.0 - 15.0 g/dL   HCT 30.3 (L) 36.0 - 46.0 %   MCV 87.8 78.0 - 100.0 fL   MCH 30.1 26.0 - 34.0 pg   MCHC 34.3 30.0 - 36.0 g/dL   RDW 15.5 11.5 - 15.5 %   Platelets 323 150 - 400 K/uL   Neutrophils Relative % 85 (H) 43 - 77 %   Neutro Abs 8.9 (H) 1.7 - 7.7 K/uL   Lymphocytes Relative 9 (L) 12 - 46 %   Lymphs Abs 0.9 0.7 - 4.0 K/uL   Monocytes Relative 6 3 - 12 %   Monocytes Absolute 0.6 0.1 - 1.0 K/uL   Eosinophils Relative 0 0 - 5 %   Eosinophils Absolute 0.0 0.0 - 0.7 K/uL   Basophils Relative 0 0 - 1 %   Basophils Absolute 0.0 0.0 - 0.1 K/uL  Comprehensive metabolic panel     Status: Abnormal   Collection Time: 07/07/14  1:10 PM  Result Value Ref Range   Sodium 141 135 - 145 mmol/L   Potassium 4.1 3.5 - 5.1 mmol/L   Chloride 109 96 - 112 mmol/L   CO2 19 19 - 32 mmol/L   Glucose, Bld 201 (H) 70 - 99 mg/dL   BUN 9 6 - 23 mg/dL   Creatinine, Ser 2.31 (H) 0.50 - 1.10 mg/dL   Calcium 10.1 8.4 - 10.5 mg/dL   Total Protein 6.6 6.0 - 8.3 g/dL   Albumin 3.1 (L) 3.5 - 5.2 g/dL   AST 23 0 - 37 U/L   ALT 16 0 - 35 U/L   Alkaline Phosphatase 52 39 - 117 U/L   Total Bilirubin 0.7 0.3 - 1.2 mg/dL   GFR  calc non Af Amer 20 (L) >90 mL/min   GFR calc Af Amer 23 (L) >90 mL/min    Comment: (NOTE) The eGFR has been calculated using the CKD EPI equation. This calculation has not been validated in all clinical situations.  eGFR's persistently <90 mL/min signify possible Chronic Kidney Disease.    Anion gap 13 5 - 15  Lactic acid, plasma     Status: None   Collection Time: 07/07/14  1:10 PM  Result Value Ref Range   Lactic Acid, Venous 1.8 0.5 - 2.0 mmol/L  Procalcitonin     Status: None   Collection Time: 07/07/14  1:10 PM  Result Value Ref Range   Procalcitonin 0.12 ng/mL    Comment:        Interpretation: PCT (Procalcitonin) <= 0.5 ng/mL: Systemic infection (sepsis) is not likely. Local bacterial infection is possible. (NOTE)         ICU PCT Algorithm               Non ICU PCT Algorithm    ----------------------------     ------------------------------         PCT < 0.25 ng/mL                 PCT < 0.1 ng/mL     Stopping of antibiotics            Stopping of antibiotics       strongly encouraged.               strongly encouraged.    ----------------------------     ------------------------------       PCT level decrease by               PCT < 0.25 ng/mL       >= 80% from peak PCT       OR PCT 0.25 - 0.5 ng/mL          Stopping of antibiotics                                             encouraged.     Stopping of antibiotics           encouraged.    ----------------------------     ------------------------------       PCT level decrease by              PCT >= 0.25 ng/mL       < 80% from peak PCT        AND PCT >= 0.5 ng/mL            Continuin g antibiotics                                              encouraged.       Continuing antibiotics            encouraged.    ----------------------------     ------------------------------     PCT level increase compared          PCT > 0.5 ng/mL         with peak PCT AND          PCT >= 0.5 ng/mL             Escalation of antibiotics  strongly encouraged.      Escalation of antibiotics        strongly encouraged.   Protime-INR     Status: None   Collection Time: 07/07/14  1:10 PM  Result Value Ref Range   Prothrombin Time 13.8 11.6 - 15.2 seconds   INR 1.05 0.00 - 1.49  APTT     Status: None   Collection Time: 07/07/14  1:10 PM  Result Value Ref Range   aPTT 30 24 - 37 seconds   Dg Chest Port 1 View  07/07/2014   CLINICAL DATA:  Right upper quadrant pain  EXAM: PORTABLE CHEST - 1 VIEW  COMPARISON:  06/19/2014  FINDINGS: Cardiac shadow is within normal limits. A new right-sided PICC line is noted in satisfactory position at the cavoatrial junction. The lungs are well aerated bilaterally. No focal infiltrate or sizable effusion is seen. No bony abnormality is noted.  IMPRESSION: PICC line in satisfactory position.  No acute abnormality noted.   Electronically Signed   By: Inez Catalina M.D.   On: 07/07/2014 13:19   Dg Abd Portable 1v  07/07/2014   CLINICAL DATA:  Persistent vomiting.  EXAM: PORTABLE ABDOMEN - 1 VIEW  COMPARISON:  CT 05/16/2014  FINDINGS: Prior cholecystectomy. Nonobstructive bowel gas pattern. No free air organomegaly. No suspicious calcification. No acute bony abnormality.  IMPRESSION: No obstruction or free air.   Electronically Signed   By: Rolm Baptise M.D.   On: 07/07/2014 09:34    Assessment:  1 AKI, prob toxic nephropathy due to vancomycin  Plan: 1 stop vanco as you have done 2 supportive therapy  Hady Niemczyk C 07/07/2014, 4:44 PM

## 2014-07-07 NOTE — Progress Notes (Signed)
Physical Therapy Session Note  Patient Details  Name: Michelle Farrell MRN: [id number] Date of Birth: 1941/02/02  Today's Date: 07/07/2014   -     Short Term Goals: Week 1:  PT Short Term Goal 1 (Week 1): = LTGs due to anticipated LOS Week 2:     Skilled Therapeutic Interventions/Progress Updates:   PT spoke to RN and PA; pt has been vomiting blood.  She is awaiting a bed on acute and will transfer ASAP.    Therapy Documentation Precautions:  Precautions Precautions: Fall Precaution Comments: Impulsive, confused Restrictions Weight Bearing Restrictions: No   Vital Signs: Therapy Vitals Temp: 99 F (37.2 C) Temp Source: Oral Pulse Rate: 97 Resp: 19 BP: (!) 201/67 mmHg Patient Position (if appropriate): Lying Oxygen Therapy SpO2: 100 % O2 Device: Not Delivered       See FIM for current functional status  Therapy/Group: Individual Therapy  Monika Chestang 07/07/2014, 7:57 AM

## 2014-07-07 NOTE — Discharge Summary (Signed)
Physician Discharge Summary  Patient ID: Michelle Farrell MRN: [id number] DOB/AGE: 1940/07/02 74 y.o.  Admit date: 06/27/2014 Discharge date: 07/07/2014  Discharge Diagnoses:  Principal Problem:   Thrombotic cerebral infarction Active Problems:   Essential hypertension   Diastolic CHF   Sepsis   Acute encephalopathy   CVA (cerebral infarction)   Bacterial meningitis   Acute renal failure   Bradycardia   Hypokalemia   Anemia   Hematemesis with nausea   Discharged Condition: Guarded    Labs:  Basic Metabolic Panel:  Recent Labs Lab 07/04/14 0920 07/04/14 1555 07/05/14 0600 07/06/14 0440  NA 142  --  139 138  K 3.6  --  3.7 3.5  CL 111  --  110 108  CO2 23  --  25 20  GLUCOSE 192*  --  161* 154*  BUN 7  --  7 9  CREATININE 2.49*  --  2.48* 2.32*  CALCIUM 9.8  --  9.4 9.6  MG  --  1.6  --   --     CBC:  Recent Labs Lab 07/04/14 0920 07/07/14 0600  WBC 8.0 9.8  HGB 9.4* 10.5*  HCT 27.7* 30.2*  MCV 89.1 87.0  PLT 230 296    CBG:  Recent Labs Lab 07/06/14 0704 07/06/14 1141 07/06/14 1647 07/06/14 2050 07/07/14 0634  GLUCAP 133* 124* 141* 156* 185*    Brief HPI:   Michelle Farrell is a 74 y.o. female with DM, chronic pain, hypertension, Crohn's disease, recent admission for gastroenteritis with dehydration. She was readmitted on 06/19/14 after found unresponsive on the floor with fever, elevated lactate and positive UA . Family occasional twitching and patient with neck stiffness and Dr.Kirpatick recommended Keppra as well as LP due to concerns of meningitis or encephalitis. She was started on Vanc, ceftriaxone, ampicillin and acyclovir. Urine culture grew 80,000 colonies E coli but AMS persisted.  LP done then by IR on day 2 of hospitalization and noted 28 WBCs with 79% neutrophils, mildly elevated glucose and mildly elevated protein to 85 with some RBCs in sample. LP without growth.  MRI brain showed acute/subacute 10 mm nonhemorrhagic infarct  within the posterior medial left thalamus.  ID consulted for input and recommended d/c antibiotics with monitoring. She had recurrent fevers with lethargy 48 hours past stopping empiric antibiotics. She was pan cultured with resumption of Vancomycin and Ceftriaxone.  Cultures negative and  Dr Tommy Medal recommended d/c antibiotics and repeat LP but granddaughter was reluctant to deescalate care. He recommends two weeks of empiric course of high dose ceftriaxone plus/minus Vancomycin. Patient noted to havae difficulty following 1 step commands, poor safety, perseverative behaviors, ongoing cognitive deficits as well as impaired mobility. CIR was recmmended for follow up therapy.      Hospital Course: Michelle Farrell was admitted to rehab 06/27/2014 for inpatient therapies to consist of PT, ST and OT at least three hours five days a week. Past admission physiatrist, therapy team and rehab RN have worked together to provide customized collaborative inpatient rehab. She was maintained on IV Vancomycin and Ceftriaxone during her stay.  Admission labs on 04/02 revealing stable K+levels. nutritional supplements were added due to low protein levels. She has been afebrile during her stay. Serial CBC shows steady improvement in H/H with hgb up to 10.5. Po  Intake has been poor despite encouragement and family was encouraged to supplement from home if able.  Blood pressures were monitored on tid basis and were relatively stable on lisinopril and spironolactone initially.  Diabetes was monitored on ac/hs basis with SSI used for elevated BS.   On 07/04/14, patient was found to have acute renal failure with elevated Cr-2.49 and elevated Vancomycin trough -47.8. She was started on IVF for hydration as acute renal failure was felt to be compounded by poor po intake and elevated Vancomycin levels in setting of ACE1 use. [facility] were contacted for assistance with medical management and input. . Vancomycin has been on hold  and last random level is 29.7 yesterday. Lisinopril and aldactone were d/c with slow steady improvement in Cr-2.32. Patient developed emesis X 2 with persistent nausea. She was bolus with IVF and treated with antiemetics as well as dose of IV reglan. emesis was gastroccult positive and patient has has nonspecific complaints of abdominal pain. She was started on IV PPI and diet was downgraded to all liquids. AM labs ordered and pending.  IVF were d/c due to evidence of peripheral edema and concerns of fluid overload. KUB was negative for obstruction of free air.  Patient was noted to be lethargic on 04/11 with inability to participate in activity. Blood pressures are trending upwards and IV hydralazine was ordered for control. Due to concerns of GIB as well as ongoing medical issues patient was transferred to telemetry for closer monitoring. Patient was discharged to acute services on 07/07/14.       Rehab course: During patient's stay in rehab weekly team conferences were held to monitor patient's progress, set goals and discuss barriers to discharge. At admission, patient required min assist with mobility and self care tasks. She had severe cognitive deficits with poor attention, lack of awareness of deficits,  deficits in memory, delayed processing and perseverative verbal output.  Patient has had improvement in activity tolerance, balance, postural control, as well as ability to compensate for deficits. She is able to bathe with set up assist and requires min assist with dressing tasks. She requires supervision to min guard assist with RW for ambulating 150'.  She requires supervision with verbal cues for safety with mobility. She requires moderate verbal and visual cues for simple functional tasks. She is oriented to self and able to relay date/place with external aids and moderate cues. She is able to express needs with mod to  Max assist and requires supervision at meal to maintain safe swallow strategies.       Scheduled Medications:  . aspirin  325 mg Oral Daily  . cefTRIAXone (ROCEPHIN)  IV  2 g Intravenous Q12H  . feeding supplement (GLUCERNA SHAKE)  237 mL Oral TID WC  . hydrALAZINE  10 mg Intravenous 4 times per day  . insulin aspart  0-9 Units Subcutaneous TID WC  . pantoprazole (PROTONIX) IV  40 mg Intravenous BID    Disposition:  Acute Hospital  Diet: Liquid diet  Special Instructions: 1. Antibiotics end on 07/08/14 2. Vancomycin per pharmacy protocol.    Follow-up Information    Follow up with Charlett Blake, MD.   Specialty:  Physical Medicine and Rehabilitation   Contact information:   [place] [id number] [phone]       Signed: Bary Leriche 07/07/2014, 10:36 AM

## 2014-07-07 NOTE — Progress Notes (Signed)
Occupational Therapy Note  Patient Details  Name: Michelle Farrell MRN: [id number] Date of Birth: 1940-07-23  Today's Date: 07/07/2014 OT Missed Time: 74 Minutes Missed Time Reason: MD hold (comment);Patient ill (comment)  Pt is not able to participate in therapy due to illness.     [place] 07/07/2014, 10:33 AM

## 2014-07-07 NOTE — Progress Notes (Signed)
Utilization review completed.  

## 2014-07-07 NOTE — Progress Notes (Signed)
Shift event note:   Notified x 2 regarding pt having 2 episodes of projectile type vomiting. Daughter [name]) at bedside and reported first episode more bilious in nature, however second episode noted to be dark with BRB in it. RN was asked to confirm hematemesis w/ Gastroccult card but daughter had already disposed of emesis. Record reviewed. At bedside pt now noted resting quietly in NAD. She awakens easily and is oriented to person, will follow commands and currently denies abd pain. Daughter states pt c/o LUQ abd pain earlier but currently pt noted w/ soft, nt abd w/ normal bs in all quads on exam. Pt has remained somewhat hypertensive (worse w/ n/v) but remaining VSS. Before leaving bedside pt had episode of vomiting of appprox 150 cc brownish-green emesis w/o obvious blood. Gastro card pending. Zofran and IV Phenergan have helped w/ nausea briefly. Will try IV Reglan. Daughter concerned about return of vomiting which pt recently had w/ "gastroenetritis" during a recent hospitalization, and bloody emesis which is new.  Assessment/Plan: 1. Hematemesis: By report. Will send gastroccult card to confirm. Does have h/o GERD, esophageal stricture and PUD but no documented h/o GI bleed. Abd exam unremarkable. Will follow up results of IV Reglan. Will give a one-time dose of Protonix 40 mg IV. CBC and Bmet added to AM labs. Aspiration precautions. Pt remains on Lovenox 30 mg q24h for VTE prophylaxis. Unless further acute issues will defer further changes in plan to rounding MD. Will continue to monitor closely.  Jeryl Columbia, NP-C [facility] Pager [phone]

## 2014-07-07 NOTE — Progress Notes (Signed)
Patient trasfered from [phone] to 5W05 via bed; alert and oriented x 2; no complaints of pain; Double PICC line in right running NS@KVO ; skin intact. Orient patient to room and unit; instructed how to use the call bell and  fall risk precautions. Will continue to monitor the patient.

## 2014-07-07 NOTE — Progress Notes (Signed)
Patient had several emesis episodes throughout night.  Per report at beginning of shift, patient had emesis x3; primarily undigested food per day shift RN; maalox given 1856 prior to my arrival.  Nausea persisted on night shift; emesis yellow bile in appearance, large amount.  Zofran 80m IV given. [facility] on call, K. Schorr text-paged [phone].  Returned call with orders Phenergan IV 12.586m repeat additional IV Zofran 70m46mn another hour.  Text-paged Schorr again 0100 as emesis was projectile per pt daughter; emesis was brown/dark red in color. Another Phenergan IV 12.5 mg order, CBC for morning, Occult blood gastric, NS 500m82mlus, and simethicone 160mg39mr pt daughter request) were added.   Schorr on floor at 0215 to assess patient. Patient had been resting for approximately 1hour without emesis. While Schorr in room, another emesis episode.  Additional orders for Reglan IV 10mg 77mgave next dose of Zofran 70mg.  79mient resting until awoke at 0345 with small emesis x1 and nausea.  Orders for IV Protonix 40mg an75mve compazine 0414.  Discussed with Schorr during first call patient's  high BP of 194/88 manual when gave scheduled hydralazine with rechecked manual BP of 172/86. HR 90-108. Patient was asymptomatic. Schorr wanted to get vomiting under control first.  Will continue to monitor patient.

## 2014-07-08 ENCOUNTER — Encounter (HOSPITAL_COMMUNITY)
Admission: AD | Disposition: A | Discharge status: Home / Self Care | Payer: Self-pay | Source: Ambulatory Visit | Attending: Internal Medicine

## 2014-07-08 ENCOUNTER — Inpatient Hospital Stay (HOSPITAL_COMMUNITY): Payer: Medicare Other | Admitting: Certified Registered"

## 2014-07-08 ENCOUNTER — Encounter (HOSPITAL_COMMUNITY): Payer: Self-pay | Admitting: General Practice

## 2014-07-08 DIAGNOSIS — R001 Bradycardia, unspecified: Secondary | ICD-10-CM

## 2014-07-08 DIAGNOSIS — K209 Esophagitis, unspecified without bleeding: Secondary | ICD-10-CM | POA: Diagnosis present

## 2014-07-08 DIAGNOSIS — N179 Acute kidney failure, unspecified: Secondary | ICD-10-CM

## 2014-07-08 DIAGNOSIS — K2970 Gastritis, unspecified, without bleeding: Secondary | ICD-10-CM | POA: Diagnosis present

## 2014-07-08 DIAGNOSIS — R1110 Vomiting, unspecified: Secondary | ICD-10-CM

## 2014-07-08 DIAGNOSIS — R933 Abnormal findings on diagnostic imaging of other parts of digestive tract: Secondary | ICD-10-CM

## 2014-07-08 DIAGNOSIS — K2981 Duodenitis with bleeding: Secondary | ICD-10-CM | POA: Diagnosis present

## 2014-07-08 DIAGNOSIS — R112 Nausea with vomiting, unspecified: Secondary | ICD-10-CM | POA: Diagnosis present

## 2014-07-08 DIAGNOSIS — K5090 Crohn's disease, unspecified, without complications: Secondary | ICD-10-CM

## 2014-07-08 DIAGNOSIS — K2990 Gastroduodenitis, unspecified, without bleeding: Secondary | ICD-10-CM

## 2014-07-08 HISTORY — PX: ESOPHAGOGASTRODUODENOSCOPY: SHX5428

## 2014-07-08 LAB — COMPREHENSIVE METABOLIC PANEL
ALK PHOS: 55 U/L (ref 39–117)
ALT: 43 U/L — ABNORMAL HIGH (ref 0–35)
ANION GAP: 12 (ref 5–15)
AST: 46 U/L — ABNORMAL HIGH (ref 0–37)
Albumin: 3.1 g/dL — ABNORMAL LOW (ref 3.5–5.2)
BUN: 11 mg/dL (ref 6–23)
CO2: 21 mmol/L (ref 19–32)
CREATININE: 2.21 mg/dL — AB (ref 0.50–1.10)
Calcium: 10.4 mg/dL (ref 8.4–10.5)
Chloride: 109 mmol/L (ref 96–112)
GFR calc Af Amer: 24 mL/min — ABNORMAL LOW (ref >90)
GFR calc non Af Amer: 21 mL/min — ABNORMAL LOW (ref >90)
Glucose, Bld: 164 mg/dL — ABNORMAL HIGH (ref 70–99)
Potassium: 4 mmol/L (ref 3.5–5.1)
Sodium: 142 mmol/L (ref 135–145)
TOTAL PROTEIN: 6.2 g/dL (ref 6.0–8.3)
Total Bilirubin: 0.7 mg/dL (ref 0.3–1.2)

## 2014-07-08 LAB — GLUCOSE, CAPILLARY
GLUCOSE-CAPILLARY: 174 mg/dL — AB (ref 70–99)
Glucose-Capillary: 147 mg/dL — ABNORMAL HIGH (ref 70–99)
Glucose-Capillary: 149 mg/dL — ABNORMAL HIGH (ref 70–99)
Glucose-Capillary: 163 mg/dL — ABNORMAL HIGH (ref 70–99)

## 2014-07-08 LAB — MAGNESIUM: Magnesium: 1.9 mg/dL (ref 1.5–2.5)

## 2014-07-08 LAB — CBC
HCT: 30.1 % — ABNORMAL LOW (ref 36.0–46.0)
HEMOGLOBIN: 10.5 g/dL — AB (ref 12.0–15.0)
MCH: 30.3 pg (ref 26.0–34.0)
MCHC: 34.9 g/dL (ref 30.0–36.0)
MCV: 86.7 fL (ref 78.0–100.0)
Platelets: 398 10*3/uL (ref 150–400)
RBC: 3.47 MIL/uL — ABNORMAL LOW (ref 3.87–5.11)
RDW: 15.9 % — ABNORMAL HIGH (ref 11.5–15.5)
WBC: 13.6 10*3/uL — AB (ref 4.0–10.5)

## 2014-07-08 SURGERY — EGD (ESOPHAGOGASTRODUODENOSCOPY)
Anesthesia: Moderate Sedation

## 2014-07-08 SURGERY — EGD (ESOPHAGOGASTRODUODENOSCOPY)
Anesthesia: Monitor Anesthesia Care

## 2014-07-08 MED ORDER — LIDOCAINE HCL (CARDIAC) 20 MG/ML IV SOLN
INTRAVENOUS | Status: DC | PRN
  Administered 2014-07-08: 40 mg via INTRAVENOUS

## 2014-07-08 MED ORDER — VITAMIN B-12 1000 MCG PO TABS
1000.0000 ug | ORAL_TABLET | Freq: Every day | ORAL | Status: DC
  Administered 2014-07-09 – 2014-07-14 (×6): 1000 ug via ORAL
  Filled 2014-07-08 (×6): qty 1

## 2014-07-08 MED ORDER — HYDRALAZINE HCL 20 MG/ML IJ SOLN: 10.0000 mg | INTRAMUSCULAR | Status: DC | PRN

## 2014-07-08 MED ORDER — PROPOFOL 10 MG/ML IV BOLUS
INTRAVENOUS | Status: DC | PRN
  Administered 2014-07-08 (×3): 10 mg via INTRAVENOUS
  Administered 2014-07-08: 20 mg via INTRAVENOUS

## 2014-07-08 MED ORDER — LABETALOL HCL 100 MG PO TABS
100.0000 mg | ORAL_TABLET | Freq: Three times a day (TID) | ORAL | Status: DC
  Administered 2014-07-08 – 2014-07-14 (×19): 100 mg via ORAL
  Filled 2014-07-08 (×20): qty 1

## 2014-07-08 MED ORDER — BUTAMBEN-TETRACAINE-BENZOCAINE 2-2-14 % EX AERO
INHALATION_SPRAY | CUTANEOUS | Status: DC | PRN
  Administered 2014-07-08: 2 via TOPICAL

## 2014-07-08 MED ORDER — MAGNESIUM CHLORIDE 64 MG PO TBEC
2.0000 | DELAYED_RELEASE_TABLET | Freq: Two times a day (BID) | ORAL | Status: DC
  Administered 2014-07-09 – 2014-07-14 (×11): 128 mg via ORAL
  Filled 2014-07-08 (×12): qty 2

## 2014-07-08 MED ORDER — PANTOPRAZOLE SODIUM 40 MG PO TBEC
40.0000 mg | DELAYED_RELEASE_TABLET | Freq: Two times a day (BID) | ORAL | Status: DC
  Administered 2014-07-08: 40 mg via ORAL
  Filled 2014-07-08 (×2): qty 1

## 2014-07-08 MED ORDER — METOPROLOL TARTRATE 1 MG/ML IV SOLN
INTRAVENOUS | Status: DC | PRN
  Administered 2014-07-08 (×2): 2 mg via INTRAVENOUS

## 2014-07-08 MED ORDER — ENSURE ENLIVE PO LIQD
237.0000 mL | Freq: Two times a day (BID) | ORAL | Status: DC
  Administered 2014-07-09 – 2014-07-11 (×4): 237 mL via ORAL

## 2014-07-08 MED ORDER — ISOSORB DINITRATE-HYDRALAZINE 20-37.5 MG PO TABS
1.0000 | ORAL_TABLET | Freq: Three times a day (TID) | ORAL | Status: DC
  Administered 2014-07-08 – 2014-07-14 (×19): 1 via ORAL
  Filled 2014-07-08 (×20): qty 1

## 2014-07-08 NOTE — Transfer of Care (Signed)
Immediate Anesthesia Transfer of Care Note  Patient: Michelle Farrell  Procedure(s) Performed: Procedure(s): ESOPHAGOGASTRODUODENOSCOPY (EGD) (N/A)  Patient Location: Endoscopy Unit  Anesthesia Type:MAC  Level of Consciousness: awake, alert  and sedated  Airway & Oxygen Therapy: Patient connected to nasal cannula oxygen  Post-op Assessment: Post -op Vital signs reviewed and stable  Post vital signs: stable  Last Vitals:  Filed Vitals:   07/08/14 1324  BP: 220/63  Pulse:   Temp: 37.6 C  Resp: 13    Complications: No apparent anesthesia complications

## 2014-07-08 NOTE — Progress Notes (Signed)
Daily Rounding Note  07/08/2014, 8:28 AM  LOS: 1 day   SUBJECTIVE:       No n/v.  No BMs.  Pt denies pain.  Slept ok.  No complaints.   OBJECTIVE:         Vital signs in last 24 hours:    Temp:  [99.4 F (37.4 C)-100.2 F (37.9 C)] 99.8 F (37.7 C) (04/12 0604) Pulse Rate:  [81-107] 96 (04/12 0604) Resp:  [17-20] 17 (04/12 0604) BP: (172-197)/(57-81) 175/61 mmHg (04/12 0604) SpO2:  [96 %-100 %] 100 % (04/12 0604) Weight:  [165 lb 14.4 oz (75.252 kg)] 165 lb 14.4 oz (75.252 kg) (04/12 0615) Last BM Date: 07/07/14 Filed Weights   07/08/14 0615  Weight: 165 lb 14.4 oz (75.252 kg)   General: comfortable, not ill appearing.  Watching TV   Heart: RRR Chest: clear bil.  No cough or labored breathing Abdomen: soft, NT, ND.  Active BS  Extremities: no CCE Neuro/Psych:  Pleasant, HOH, appropriate, did not assess orientation but follows commands.   Intake/Output from previous day: 04/11 0701 - 04/12 0700 In: 20 [I.V.:20] Out: 1700 [Urine:1700]  Intake/Output this shift:    Lab Results:  Recent Labs  07/07/14 0600 07/07/14 1310 07/08/14 0615  WBC 9.8 10.4 13.6*  HGB 10.5* 10.4* 10.5*  HCT 30.2* 30.3* 30.1*  PLT 296 323 398   BMET  Recent Labs  07/06/14 0440 07/07/14 1100 07/07/14 1310  NA 138 143 141  K 3.5 4.3 4.1  CL 108 108 109  CO2 20 18* 19  GLUCOSE 154* 212* 201*  BUN 9 9 9   CREATININE 2.32* 2.42* 2.31*  CALCIUM 9.6 10.4 10.1   LFT  Recent Labs  07/07/14 1100 07/07/14 1310  PROT 6.5 6.6  ALBUMIN 3.3* 3.1*  AST 23 23  ALT 14 16  ALKPHOS 55 52  BILITOT 0.5 0.7  BILIDIR <0.1  --   IBILI NOT CALCULATED  --    PT/INR  Recent Labs  07/07/14 1310  LABPROT 13.8  INR 1.05   Hepatitis Panel No results for input(s): HEPBSAG, HCVAB, HEPAIGM, HEPBIGM in the last 72 hours.  Studies/Results: Ct Abdomen Pelvis Wo Contrast 07/07/2014   CLINICAL DATA:  Right and left upper quadrant  pain. Projectile vomiting. Nausea, hematemesis. History of gastroenteritis, Crohn's, peptic ulcer disease, esophageal stricture.  EXAM: CT ABDOMEN AND PELVIS WITHOUT CONTRAST  TECHNIQUE: Multidetector CT imaging of the abdomen and pelvis was performed following the standard protocol without IV contrast.  COMPARISON:  CT of the abdomen and pelvis on 05/16/2014  FINDINGS: Lower chest: There is bibasilar atelectasis in the lung bases. The heart is enlarged in the areas significant coronary artery calcification.  Upper abdomen: No focal abnormality identified within the liver, spleen, pancreas, or adrenal glands. The low-attenuation lesion within the lateral portion of the right kidney is stable in appearance and consistent with a small cyst. The left kidney has a normal appearance. Ureters are unremarkable in appearance.  Gastrointestinal tract: The stomach wall appears diffusely thickened, particularly within the antral region with the wall measures 2.1 cm. This represents a new finding since the previous exam. There is no obstruction to contrast which passes into the nondilated loops of small bowel. Large bowel loops are nondilated but there is significant stool burden. The appendix is well seen and has a normal appearance.  Pelvis: There is a low-attenuation structure adjacent to the vaginal cuff, measuring 6.4 x 5.2 cm. Previously  this measured 7.3 x 7.8 cm. This mass is low-attenuation consistent with cystic contents. A Foley catheter is identified within the bladder and is probably been clamped given the presence of fluid within the bladder. There is no free pelvic fluid.  Retroperitoneum: No retroperitoneal or mesenteric adenopathy. There is significant atherosclerotic calcification of the abdominal aorta.  Abdominal wall: A supraumbilical fat containing hernia measures 4.0 cm. This does not involve bowel loops in appears uncomplicated.Numerous soft tissue nodules are identified throughout the anterior abdominal  wall. 2 sites in the right lower abdominal wall contain a small locular gas. The patient may be receiving numerous subcutaneous injections and correlation is recommended with history of injection sites.  Osseous structures: There is moderate degenerative change within the lumbar spine.  IMPRESSION: 1. Marked thickening of the stomach wall, particularly within the antral region. Considerations include gastritis, peptic ulcer disease. Metastases can have a similar appearance in the setting of known malignancy. The finding would be atypical for primary gastric malignancy. 2. Cardiomegaly and coronary artery disease. 3. Small right renal cyst, stable in appearance. 4. Low-attenuation pelvic structure, favoring right adnexal cyst, 6.4 cm. This is not completely characterized. Consider further evaluation with pelvic ultrasound. 5. Stable fat containing anterior abdominal wall hernia.   Electronically Signed   By: Nolon Nations M.D.   On: 07/07/2014 19:36   Scheduled Meds: . ceFEPime (MAXIPIME) IV  1 g Intravenous Q24H  . hydrALAZINE  10 mg Intravenous Q6H  . insulin aspart  0-9 Units Subcutaneous 4 times per day  . metronidazole  500 mg Intravenous Q8H  . pantoprazole (PROTONIX) IV  40 mg Intravenous Q12H  . sodium chloride  1,000 mL Intravenous Once  . sodium chloride  10-40 mL Intracatheter Q12H  . sodium chloride  3 mL Intravenous Q12H     ASSESMENT:    * N/V (intermittent recurrences), CG emesis. Multiple recent admits, 2 with n/v dehydration, latest with urosepsis and CVA. On 325 ASA for the stroke. C diff negative 3/31.  Thickened gastric wall on non-IV contrast CT and no colonic pathology. Rule out ulcer, rule out MWT, r/o gastroparesis, r/o neoplasia.  Remote NSAID ulcer, EGD of 2008 with benign esophageal stricture/spasm. Acute N/V and loose stools resolved.    * Hx mild Crohn's ileocolitis. On chronic Sulfasalazine. Previously on Asacol. Per dtr's report a 2015 colonoscopy in [place]  did not show active Crohn's.   *  Right pelvic lesion, ? Adnexal cyst. Interval decreased size c/w 04/2014.   *  CKD   PLAN   *  EGD today.  *  Since the active GI sxs, including diarrhea, have resolved can probably stop orders for enteric precautions  *  Note that ID had previously recommended 2 weeks empiric Vanco and rocephin, wonder if this should be resumed in place of recent switch to Flagyl and Maxipime?   *  Keep BID IV Protonix in place for now.  Reassess need for this after EGD.  *  Consider pelvic u/s to eval the right relvic lesion.   Azucena Freed  07/08/2014, 8:28 AM Pager: [phone]     Attending physician's note   I have taken an interval history, reviewed the chart and examined the patient. I agree with the Advanced Practitioner's note, impression and recommendations.   Pricilla Riffle. [place] Plan, MD [facility]

## 2014-07-08 NOTE — Progress Notes (Signed)
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 We will sign off but please call as needed. Thank you.   Objective: Vital signs in last 24 hours: Temp:  [99.4 F (37.4 C)-99.8 F (37.7 C)] 99.8 F (37.7 C) (04/12 0604) Pulse Rate:  [81-106] 96 (04/12 0604) Resp:  [17-20] 17 (04/12 0604) BP: (172-197)/(57-81) 175/61 mmHg (04/12 0604) SpO2:  [96 %-100 %] 100 % (04/12 0604) Weight:  [75.252 kg (165 lb 14.4 oz)] 75.252 kg (165 lb 14.4 oz) (04/12 0615) Weight change:   Intake/Output from previous day: 04/11 0701 - 04/12 0700 In: 20 [I.V.:20] Out: 1700 [Urine:1700] Intake/Output this shift:    General appearance: alert GI: soft, non-tender; bowel sounds normal; no masses,  no organomegaly Extremities: extremities normal, atraumatic, no cyanosis or edema  Lab Results:  Recent Labs  07/07/14 1310 07/08/14 0615  WBC 10.4 13.6*  HGB 10.4* 10.5*  HCT 30.3* 30.1*  PLT 323 398   BMET:  Recent Labs  07/07/14 1310 07/08/14 1029  NA 141 142  K 4.1 4.0  CL 109 109  CO2 19 21  GLUCOSE 201* 164*  BUN 9 11  CREATININE 2.31* 2.21*  CALCIUM 10.1 10.4   No results for input(s): PTH in the last 72 hours. Iron Studies:  Recent Labs  07/06/14 0440  IRON 48  TIBC 176*  TRANSFERRIN 142*  FERRITIN 186   Studies/Results: Ct Abdomen Pelvis Wo Contrast  07/07/2014   CLINICAL DATA:  Right and left upper quadrant pain. Projectile vomiting. Nausea, hematemesis. History of gastroenteritis, Crohn's, peptic ulcer disease, esophageal stricture.  EXAM: CT ABDOMEN AND PELVIS WITHOUT CONTRAST  TECHNIQUE: Multidetector CT imaging of the abdomen and pelvis was performed following the standard protocol without IV contrast.  COMPARISON:  CT of the abdomen and pelvis on 05/16/2014  FINDINGS: Lower chest: There is bibasilar atelectasis in the lung bases. The heart is enlarged in the areas  significant coronary artery calcification.  Upper abdomen: No focal abnormality identified within the liver, spleen, pancreas, or adrenal glands. The low-attenuation lesion within the lateral portion of the right kidney is stable in appearance and consistent with a small cyst. The left kidney has a normal appearance. Ureters are unremarkable in appearance.  Gastrointestinal tract: The stomach wall appears diffusely thickened, particularly within the antral region with the wall measures 2.1 cm. This represents a new finding since the previous exam. There is no obstruction to contrast which passes into the nondilated loops of small bowel. Large bowel loops are nondilated but there is significant stool burden. The appendix is well seen and has a normal appearance.  Pelvis: There is a low-attenuation structure adjacent to the vaginal cuff, measuring 6.4 x 5.2 cm. Previously this measured 7.3 x 7.8 cm. This mass is low-attenuation consistent with cystic contents. A Foley catheter is identified within the bladder and is probably been clamped given the presence of fluid within the bladder. There is no free pelvic fluid.  Retroperitoneum: No retroperitoneal or mesenteric adenopathy. There is significant atherosclerotic calcification of the abdominal aorta.  Abdominal wall: A supraumbilical fat containing hernia measures 4.0 cm. This does not involve bowel loops in appears uncomplicated.Numerous soft tissue nodules are identified throughout the anterior abdominal wall. 2 sites in the right lower abdominal wall contain a small locular gas. The patient may be receiving numerous subcutaneous injections and correlation is recommended with history of injection sites.  Osseous structures: There is  moderate degenerative change within the lumbar spine.  IMPRESSION: 1. Marked thickening of the stomach wall, particularly within the antral region. Considerations include gastritis, peptic ulcer disease. Metastases can have a similar  appearance in the setting of known malignancy. The finding would be atypical for primary gastric malignancy. 2. Cardiomegaly and coronary artery disease. 3. Small right renal cyst, stable in appearance. 4. Low-attenuation pelvic structure, favoring right adnexal cyst, 6.4 cm. This is not completely characterized. Consider further evaluation with pelvic ultrasound. 5. Stable fat containing anterior abdominal wall hernia.   Electronically Signed   By: Nolon Nations M.D.   On: 07/07/2014 19:36   Dg Chest Port 1 View  07/07/2014   CLINICAL DATA:  Right upper quadrant pain  EXAM: PORTABLE CHEST - 1 VIEW  COMPARISON:  06/19/2014  FINDINGS: Cardiac shadow is within normal limits. A new right-sided PICC line is noted in satisfactory position at the cavoatrial junction. The lungs are well aerated bilaterally. No focal infiltrate or sizable effusion is seen. No bony abnormality is noted.  IMPRESSION: PICC line in satisfactory position.  No acute abnormality noted.   Electronically Signed   By: Inez Catalina M.D.   On: 07/07/2014 13:19   Dg Abd Portable 1v  07/07/2014   CLINICAL DATA:  Persistent vomiting.  EXAM: PORTABLE ABDOMEN - 1 VIEW  COMPARISON:  CT 05/16/2014  FINDINGS: Prior cholecystectomy. Nonobstructive bowel gas pattern. No free air organomegaly. No suspicious calcification. No acute bony abnormality.  IMPRESSION: No obstruction or free air.   Electronically Signed   By: Rolm Baptise M.D.   On: 07/07/2014 09:34    Scheduled: . ceFEPime (MAXIPIME) IV  1 g Intravenous Q24H  . feeding supplement (ENSURE ENLIVE)  237 mL Oral BID BM  . hydrALAZINE  10 mg Intravenous Q6H  . insulin aspart  0-9 Units Subcutaneous 4 times per day  . metronidazole  500 mg Intravenous Q8H  . pantoprazole (PROTONIX) IV  40 mg Intravenous Q12H  . sodium chloride  1,000 mL Intravenous Once  . sodium chloride  10-40 mL Intracatheter Q12H  . sodium chloride  3 mL Intravenous Q12H     LOS: 1 day   Maeven Mcdougall  C 07/08/2014,12:10 PM

## 2014-07-08 NOTE — Op Note (Addendum)
[facility] [place], [address]   ENDOSCOPY PROCEDURE REPORT  PATIENT: Michelle, Farrell  MR#: [id number] BIRTHDATE: 10/28/1940 , 85  yrs. old GENDER: female ENDOSCOPIST: Ladene Artist, MD, [facility] REFERRED BY:  [facility], [facility] PROCEDURE DATE:  07/08/2014 PROCEDURE:  EGD, diagnostic ASA CLASS:     Class III INDICATIONS:  nausea, vomiting, hematemesis, and abnormal CT of the GI tract. MEDICATIONS: Monitored anesthesia care and Per Anesthesia TOPICAL ANESTHETIC: DESCRIPTION OF PROCEDURE: After the risks benefits and alternatives of the procedure were thoroughly explained, informed consent was obtained.  The Pentax Gastroscope [id number] endoscope was introduced through the mouth and advanced to the second portion of the duodenum , Without limitations.  The instrument was slowly withdrawn as the mucosa was fully examined.    ESOPHAGUS: There was LA Class A esophagitis (One or more mucosal breaks < 5 mm in maximal length) noted. STOMACH: Gastritis was found in the gastric body.   The stomach otherwise appeared normal. DUODENUM: Mild duodenal inflammation was found in the duodenal bulb. The duodenal mucosa showed no abnormalities in the 2nd part of the duodenum.  Retroflexed views revealed a 5 cm hiatal hernia. The scope was then withdrawn from the patient and the procedure completed.  COMPLICATIONS: There were no immediate complications.  ENDOSCOPIC IMPRESSION: 1.   LA Class A esophagitis 2.   Mild erosive gastritis in the gastric body 3.   Mild nonerosive duodenitis 4.   5 cm hiatal hernia  RECOMMENDATIONS: 1.  Anti-reflux regimen long term 2.  PPI bid: omeprazole 40 mg po bid long term 3.  Follow office appointment in 6 weeks with your gastroenterologist in [facility] 4.  Minimize or avoid ASA/NSAID products  eSigned:  Ladene Artist, MD, [facility] 07/08/2014 2:40 PM Revised: 07/08/2014 2:40 PM

## 2014-07-08 NOTE — Progress Notes (Signed)
Patient back from Endo. No acute distress noted, no complaints. Will continue to monitor.

## 2014-07-08 NOTE — H&P (View-Only) (Signed)
[facility] Consult: 1:03 PM 07/07/2014  LOS: 0 days    Referring Provider: Dr Sheran Fava.   Primary Care Physician:  Hermelinda Medicus, MD Primary Gastroenterologist:  Previously Dr. Delfin Edis, now with GI in [facility].    Reason for Consultation: CG emesis.    HPI: Michelle Farrell is a 74 y.o. female.  PMH DM, chronic pain, Crohn's ileocolitis (takes Sulfasalazine 500 mg QID).    GERD and dysmotility, s/p dilation of esophagus in 2011 and ~2006, hx NSAID induced ulcers in 1990s.  CHF-D (grade 1), CKD, COPD.  Pt has not been seen by Dr Olevia Perches since her colonoscopy in 10/2009.  Pt previously on Asacol but now on Sulfasalazine.   [facility] in[facility] stay 05/16/14- 05/18/14 and 3/16 - 06/16/14 with gastroenteritis and dehydration. C diff negative 06/26/14.   CT abd/pelvis 05/16/14 showed cecal and distal SB distension without obstruction or signs of inflammation, fat containing ventral and umbilical hernia, large right pelvic cystic structure (?pelvic inclusion cyst, less likely bladder tic) which has yet to be further evaluated.  [facility] inpt 3/24 - 06/27/14 with sepsis (likely urosepsis, ID did not endorse dx of bacterial meningitis).  CVA on MRI.  AKI.  Discharge meds included vanco and Rocephin,  20 mg Omeprazole qday, Pepcid 20 mg at HS, 325 mg ASA, prn Zofran, . Hydrocodone was discontinued.  [facility] inpt rehab from 4/1 - 07/07/14. U/A negative on 07/05/14.   Today transferred back to inpt with intractable n/v and hematemesis, pain in left abdomen for the past 2 days.  Starting in afternoon yesterday had 3 or 4 episodes of vomiting undigested food, progressed to bilious then progressed to CG with scant streaks of blood overnight.  Never had a BM.  Since transfer back to inpt floor:  No emesis.   In past 4 days Hgb went form 9.4 to 10.5. Up  from nadir of 8.1 on 4/1.  Portable KUB unremarkable. Non-contrast CT ab/pelvis ordered.  Pt has some expressive aphasia and is drowsy, not able to provide detailed history.  Her sistersays that between bouts of n/v in last several months, she had not had significant GI complaints.      Past Medical History  Diagnosis Date  . CHF (congestive heart failure)   . Hypertension   . Back pain, chronic   . Hip pain, chronic   . GERD (gastroesophageal reflux disease)   . Crohn disease   . Diabetes mellitus without complication   . Esophageal stricture     s/p dilation 2003, 2008  . PUD (peptic ulcer disease)   . CKD (chronic kidney disease)   . COPD (chronic obstructive pulmonary disease)     Past Surgical History  Procedure Laterality Date  . Abdominal hysterectomy    . Tonsillectomy    . Cholecystectomy    . Bladder surgery      Prior to Admission medications   Medication Sig Start Date End Date Taking? Authorizing Provider  bisacodyl (DULCOLAX) 10 MG suppository Place 1 suppository (10 mg total) rectally daily as needed for moderate constipation. 06/27/14  Donne Hazel, MD  cefTRIAXone (ROCEPHIN) 40 MG/ML IVPB Inject 50 mLs (2 g total) into the vein every 12 (twelve) hours. 06/27/14   Donne Hazel, MD  Cholecalciferol (VITAMIN D) 2000 UNITS CAPS Take 1 capsule by mouth daily.    Historical Provider, MD  folic acid (FOLVITE) 1 MG tablet Take 1 mg by mouth daily.    Historical Provider, MD  omeprazole (PRILOSEC) 20 MG capsule Take 20 mg by mouth every morning.     Historical Provider, MD  ondansetron (ZOFRAN) 4 MG tablet Take 1 tablet (4 mg total) by mouth every 6 (six) hours as needed for nausea. 06/16/14   Velvet Bathe, MD  PROAIR HFA 108 (90 BASE) MCG/ACT inhaler Inhale 1 puff into the lungs every 6 (six) hours as needed. Shortness of breath or wheezing 03/03/14   Historical Provider, MD    Scheduled Meds: . ceFEPime (MAXIPIME) IV  1 g Intravenous Q24H  . hydrALAZINE  10 mg  Intravenous Q6H  . metronidazole  500 mg Intravenous Q8H  . pantoprazole (PROTONIX) IV  80 mg Intravenous Once  . [START ON 07/11/2014] pantoprazole (PROTONIX) IV  40 mg Intravenous Q12H  . sodium chloride  1,000 mL Intravenous Q1H   Followed by  . sodium chloride  500 mL Intravenous Q1H  . sodium chloride  3 mL Intravenous Q12H   Infusions: . pantoprozole (PROTONIX) infusion     PRN Meds: bisacodyl, ondansetron **OR** ondansetron (ZOFRAN) IV   Allergies as of 07/07/2014  . (No Known Allergies)    Family History  Problem Relation Age of Onset  . Diabetes Mellitus II Other     History   Social History  . Marital Status: Widowed    Spouse Name: N/A  . Number of Children: N/A  . Years of Education: N/A   Occupational History  . Not on file.   Social History Main Topics  . Smoking status: Current Every Day Smoker  . Smokeless tobacco: Never Used  . Alcohol Use: No  . Drug Use: No  . Sexual Activity: No   Other Topics Concern  . Not on file   Social History Narrative    REVIEW OF SYSTEMS: Constitutional:  On rehab had progressed to walking with walker and speech had improved.  ENT:  No nose bleeds.  Normally wears full set of dentures which are at home  Pulm:  No cough or dyspnea CV:  No palpitations, no LE edema.  GU:  No hematuria, no frequency GI:  Per HPI Heme:  No hx excessive bleeding or bruising.    Transfusions:  None in records Neuro:  No headaches, no peripheral tingling or numbness Derm:  No itching, no rash or sores.  Endocrine:  No sweats or chills.  No polyuria or dysuria Immunization:  Not queried Travel:  None beyond local counties in last few months.    PHYSICAL EXAM: Vital signs in last 24 hours: There were no vitals filed for this visit. Wt Readings from Last 3 Encounters:  07/02/14 159 lb 13.3 oz (72.5 kg)  06/25/14 162 lb 4.1 oz (73.6 kg)  06/16/14 165 lb 12.6 oz (75.2 kg)    General: somewhat ill looking but comfortable elderly  AAF.  Head:  No facial edema, mouth drops on the right  Eyes:  No icterus or conj pallor Ears:  Not HOH  Nose:  No congestion or discharge Mouth:  Clear, moist oral mucosa, edentulous. Neck:  No JVD, no mass, no TMG Lungs:  Scant  fine crackles in bases bil.  No dyspnea or cough.  Heart: RRR.  No mrg Abdomen:  Soft, NT, ND, scant BS.  No mass, bruits or HSM. Marland Kitchen   Rectal: scant mucoid, brown stool is FOBT negative.  The rectal vault is lax in tone.    Musc/Skeltl: no joint swelling or redness.   Extremities:  No CCE  Neurologic:  Oriented to "hospital", "2005" and to self.  She is drowsy though not obtunded but answers and acts appropriately.  Skin:  No rash or sores Tattoos:  None.  Nodes:  No cervical adenopathy.    Psych:  Drowsy, affect blunted. Calm.   Intake/Output from previous day:   Intake/Output this shift:    LAB RESULTS:  Recent Labs  07/07/14 0600  WBC 9.8  HGB 10.5*  HCT 30.2*  PLT 296   BMET Lab Results  Component Value Date   NA 143 07/07/2014   NA 138 07/06/2014   NA 139 07/05/2014   K 4.3 07/07/2014   K 3.5 07/06/2014   K 3.7 07/05/2014   CL 108 07/07/2014   CL 108 07/06/2014   CL 110 07/05/2014   CO2 18* 07/07/2014   CO2 20 07/06/2014   CO2 25 07/05/2014   GLUCOSE 212* 07/07/2014   GLUCOSE 154* 07/06/2014   GLUCOSE 161* 07/05/2014   BUN 9 07/07/2014   BUN 9 07/06/2014   BUN 7 07/05/2014   CREATININE 2.42* 07/07/2014   CREATININE 2.32* 07/06/2014   CREATININE 2.48* 07/05/2014   CALCIUM 10.4 07/07/2014   CALCIUM 9.6 07/06/2014   CALCIUM 9.4 07/05/2014   LFT  Recent Labs  07/05/14 0600 07/07/14 1100  PROT 4.9* 6.5  ALBUMIN 2.6* 3.3*  AST 12 23  ALT 12 14  ALKPHOS 47 44  BILITOT 0.4 0.5  BILIDIR  --  <0.1  IBILI  --  NOT CALCULATED   PT/INR Lab Results  Component Value Date   INR 1.09 06/19/2014   Lipase     Component Value Date/Time   LIPASE 20 07/07/2014 1100    RADIOLOGY STUDIES: Dg Abd Portable 1v  07/07/2014    CLINICAL DATA:  Persistent vomiting.  EXAM: PORTABLE ABDOMEN - 1 VIEW  COMPARISON:  CT 05/16/2014  FINDINGS: Prior cholecystectomy. Nonobstructive bowel gas pattern. No free air organomegaly. No suspicious calcification. No acute bony abnormality.  IMPRESSION: No obstruction or free air.   Electronically Signed   By: Rolm Baptise M.D.   On: 07/07/2014 09:34    ENDOSCOPIC STUDIES: 2015 Colonoscopy in [facility] Dtr relays that polyps were removed but otherwise benign study, no active Crohn's.   10/2009  Colonoscopy  for hx Crohn's since 1990 ENDOSCOPIC IMPRESSION:  1) Polyps, multiple in the rectum    2) Polyps, multiple throughout the colon  3) Ulcer in the cecum  4) Normal terminal ileum  5) Mild diverticulosis in the ascending colon  6) Normal rectum  multiple small polyps and pseudopolyps, no evidence of Crohn's  disease in the TI, the only activiti noted in the cecum at the  site of a linear ulceration, ileocecal valve appears normal ( not  deformed) Pathology:  Hyperplastic polyps.  Patchy Chronic active colitis, chronic active ileitis with surface erosion: both most c/w Crohn's.    11/2006  EGD for dysphagia and hx dilation of benign esoph stricture Findings: esophageal spasm vs mild stricture: this was dilated.    IMPRESSION:   *  N/V (intermittent recurrences), CG emesis.  Rule out ulcer, rule out MWT,  r/o gastroparesis Remote NSAID ulcer, EGD of 2008 with benign esophageal stricture/spasm.   *  Hx mild Crohn's ileocolitis. On chronic Sulfasalazine. Previously on Asacol. Per dtr's report a 2015 colonoscopy in [place] did not show active Crohn's.   *  Several recent hospital stays, 2 with n/v dehydration, latest with urosepsis and CVA. She is only on 325 ASA for the stroke. ? Underlying gastroparesis.     PLAN:     *  Oral contrast CT abdomen/pelvis ordered  *  EGD, tomorrow. Azucena Freed  07/07/2014, 1:03 PM Pager:  [phone]       Attending physician's note   I have taken a history, examined the patient and reviewed the chart. I agree with the Advanced Practitioner's note, impression and recommendations. Recurrent N/V with history of Crohn's ileocolitis. Had coffee ground emesis on ASA. Abd/pelvic CT today and EGD tomorrow.   Ladene Artist, MD [facility] Regal

## 2014-07-08 NOTE — Anesthesia Preprocedure Evaluation (Addendum)
Anesthesia Evaluation  Patient identified by MRN, date of birth, ID band Patient awake    Reviewed: Allergy & Precautions, NPO status , Patient's Chart, lab work & pertinent test results  History of Anesthesia Complications Negative for: history of anesthetic complications  Airway Mallampati: II  TM Distance: >3 FB Neck ROM: Full    Dental  (+) Dental Advisory Given, Edentulous Upper, Edentulous Lower   Pulmonary shortness of breath, sleep apnea , COPD COPD inhaler, Current Smoker,  breath sounds clear to auscultation        Cardiovascular hypertension, Pt. on medications + Peripheral Vascular Disease and +CHF Rhythm:Regular     Neuro/Psych  Neuromuscular disease CVA    GI/Hepatic PUD, GERD-  ,  Endo/Other  diabetes, Well Controlled, Type 2, Oral Hypoglycemic Agents  Renal/GU Renal disease     Musculoskeletal  (+) Arthritis -,   Abdominal (+)  Abdomen: soft. Bowel sounds: normal.  Peds  Hematology  (+) anemia ,   Anesthesia Other Findings   Reproductive/Obstetrics                            Anesthesia Physical Anesthesia Plan  ASA: III  Anesthesia Plan: MAC   Post-op Pain Management:    Induction: Intravenous  Airway Management Planned: Natural Airway  Additional Equipment: None  Intra-op Plan:   Post-operative Plan:   Informed Consent: I have reviewed the patients History and Physical, chart, labs and discussed the procedure including the risks, benefits and alternatives for the proposed anesthesia with the patient or authorized representative who has indicated his/her understanding and acceptance.     Plan Discussed with: CRNA and Surgeon  Anesthesia Plan Comments:         Anesthesia Quick Evaluation

## 2014-07-08 NOTE — Interval H&P Note (Signed)
History and Physical Interval Note:  07/08/2014 1:10 PM  Michelle Farrell  has presented today for surgery, with the diagnosis of coffee ground emesis.  The various methods of treatment have been discussed with the patient and family. After consideration of risks, benefits and other options for treatment, the patient has consented to  Procedure(s): ESOPHAGOGASTRODUODENOSCOPY (EGD) (N/A) as a surgical intervention .  The patient's history has been reviewed, patient examined, no change in status, stable for surgery.  I have reviewed the patient's chart and labs.  Questions were answered to the patient's satisfaction.     Pricilla Riffle. Fuller Plan MD

## 2014-07-08 NOTE — Progress Notes (Signed)
Nutrition Brief Note  Patient identified on the Malnutrition Screening Tool (MST) Report  Wt Readings from Last 15 Encounters:  07/08/14 165 lb 14.4 oz (75.252 kg)  07/02/14 159 lb 13.3 oz (72.5 kg)  06/25/14 162 lb 4.1 oz (73.6 kg)  06/16/14 165 lb 12.6 oz (75.2 kg)  05/16/14 169 lb (76.658 kg)  08/30/13 193 lb 12.8 oz (87.907 kg)  07/14/13 199 lb (90.266 kg)  11/09/09 219 lb 12.8 oz (99.701 kg)  12/08/08 213 lb (96.616 kg)  11/14/08 216 lb 8 oz (98.204 kg)  02/12/08 192 lb 3.2 oz (87.181 kg)   Merced Brougham is a 74 y.o. female. PMH DM, chronic pain, Crohn's ileocolitis (takes Sulfasalazine 500 mg QID). GERD and dysmotility, s/p dilation of esophagus in 2011 and ~2006, hx NSAID induced ulcers in 1990s. CHF-D (grade 1), CKD, COPD.  Pt has not been seen by Dr Olevia Perches since her colonoscopy in 10/2009. Pt previously on Asacol but now on Sulfasalazine  Pt down for EGD at time of visit. Noted poor po intake 2-3 days PTA due to emesis. Chart reviewed. Noted RD note from 2 months ago (previous admission) reveals intentional weight loss due to following a healthy diet and decreasing portions. Wt has been stable since previous admission.   Body mass index is 26.79 kg/(m^2). Patient meets criteria for normal weight range based on current BMI.   Current diet order is NPO (due to EGD), patient is consuming approximately n/a% of meals at this time. Labs and medications reviewed.   No nutrition interventions warranted at this time. If nutrition issues arise, please consult RD.   Tasmin Exantus A. Jimmye Norman, RD, LDN, CDE Pager: [phone] After hours Pager: [phone]

## 2014-07-08 NOTE — Progress Notes (Signed)
[facility] PROGRESS NOTE  Michelle Farrell NOI:[id number] DOB: July 16, 1940 DOA: 07/07/2014 PCP: Hermelinda Medicus, MD  Brief Summary  The patient is a 74 y.o. 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 She had admissions in February and March of this year for gastroenteritis symptoms (n/v/d). She returned to the hospital on 06/19/14 with fever, altered mental status, and severe sepsis. There was concern for meningitis. She was found to have a UTI and a CVA. She was evaluated by neurology and infectious disease, stabilized and was transferred to [place] on 06/27/2014 on vancomycin and ceftriaxone. Infectious Disease continued to follow her in CIR. On 4/8, she was found to have acute renal failure and a vancomycin trough level of 47. She was also on lisinopril and spironolactone. She was also noted to be bradycardic. She was started on IV fluids, her ACE inhibitor and spironolactone were discontinued, and she had a mild improvement in her kidney function. For bradycardia, her clonidine was discontinued and she was started on hydralazine. On 4/10 into 4/11, she developed epigastric pain with intractable nausea and vomiting. She had bilious and bloody emesis. She was readmitted for further evaluation. Labs were concerning for a positive gap metabolic acidosis, rising creatinine, decreasing hemoglobin. She developed a low-grade fever with tachycardia. Her blood pressure was elevated. For her vomiting, she underwent EGD on 4/12 which demonstrated esophagitis, gastritis, and duodenitis.  Her temperature is starting to trend down with broader spectrum antibiotics.  Cultures are pending.  She has not eaten anything since admission and is now on CLD post-EGD.  Start advancing diet and follow up cultures.  Also working on blood pressure control.  Plan to send back to  inpatient rehab once clinically stable.    Assessment/Plan  Esophagitis, gastritis and duodenitis with previously intractable nausea and vomiting and hematemesis, resolving. She had had two previous admissions with "gastroenteritis" in the last few months.  KUB without obstruction or evidence of free air, and CT scan without acute changes.   -  Appreciate GI assistance -  EGD:  Esophagitis, gastritis and duodenitis -  BID PPI indefinitely -  Advance diet as tolerated -  Hemoglobin stable  Fever/SIRS while on antibiotics and in the setting of recent meningitis. Evaluated during previous admission by Infectious Disease.  She has not had any obvious source of fever so perhaps this was drug fever related to her vancomycin toxicity. - Would have completed a 2 week course of Rocephin 4/11 - Vancomycin recently discontinued due to AKI - Vancomycin level:  Down to 20 - continue cefepime plus Flagyl - urinalysis and culture were not drawn at admission despite foley - Lactic acid wnl - no diarrhea -  Temperature trending down  Acute renal failure, likely secondary to vancomycin and dehydration in the setting of ACEI use. Baseline creatinine 0.89. Creatinine peak 2.49 on 4/8. trending down again - Continue to hold lisinopril and spironolactone - appreciate Nephrology assistance  +gap metabolic acidosis in setting of sepsis and abdominal pain concerning for intraabdominal process, resolved.  Lactic acid wnl.  Sinus bradycardia, likely side effect of clonidine and resolved.  BP had been moderately controlled on hydralazine previously, but currently quite elevated - ECG: Sinus brady - TSH 0.491 - Stopped clonidine on 4/9  Hypertension, BP elevated likely due to stress from vomiting - Holding spironolactone and lisinopril due to AKI - Stopped clonidine due to bradycardia - start bidil TID  -  Add labetalol 180m TID and monitor for recurrent bradycardia -  Consider adding  norvasc if BP remains elevated  Chronic diastolic heart failure, appears hypervolemic but without acute on chronic exacerbation  Recent thrombotic cerebral infarction - Held Aspirin 325 mg due to hematemesis, consider resuming once she is able to tolerate a diet  Mass cyst posterior to bladder, may be malignancy or bladder diverticulum, pelvic inclusion cyst, or right adnexal cyst -  Outpatient pelvic [place] Leukocytosis, may be due to underlying infection or reactive from vomiting -  Trend WBC  Normocytic anemia, likely due to acute illness and AKI, no obvious bleeding but has history of iron deficiency - Occult stool not obtained - Iron studies wnl - Vit b12 low - Start vitamin b12 supplementation - Folate 6.1 - TSH wnl  Hypokalemia, potassium had dropped to 2.9, resolved with supplementation - Resume spironolactone when able - Magnesium 1.6, resume magnesium supplementation once tolerating diet  Hyperglycemia, CBG at goal (around 140 in hospital). Hemoglobin a1c was 5.3 - continue low dose sliding scale insulin.  Mild elevated in AST/ALT may be due to hemoconcentration after holding IVF in setting of low PO intake -  Repeat in AM and if normal, no further work up -  Liver appearance on CT was normal and she is s/p cholecystectomy.  She has been hospitalized for weeks, so doubt new infection of hepatitis.   -  Avoid tylenol  Decreased appetite. Consider resuming Megace when tolerating PO  Diet: CLD and advance as tolerated Access: PICC IVF: off Proph: SCD  Code Status: full Family Communication: patient alone Disposition Plan: Admit to telemetry  Consultants:  GI, Dr. SSilvio Pate Nephrology, Dr. PFlorene Glen Procedures:  CXR  CT abd/pelvis  Antibiotics:  Cefepime 4/12 >>   Flagyl 4/12 >>   HPI/Subjective:  Tired post EGD today, but states vomiting and nausea are much better.  No diarrhea since admission.  Swelling is better today.     Objective: Filed Vitals:   07/08/14 1324 07/08/14 1440 07/08/14 1450 07/08/14 1459  BP: 220/63 209/73 202/76 198/69  Pulse:  80 82 82  Temp: 99.6 F (37.6 C) 99.4 F (37.4 C)    TempSrc: Oral Oral    Resp: 13 25 22 26   Weight:      SpO2: 98% 100% 99% 99%    Intake/Output Summary (Last 24 hours) at 07/08/14 1505 Last data filed at 07/08/14 1504  Gross per 24 hour  Intake    310 ml  Output   1300 ml  Net   -990 ml   Filed Weights   07/08/14 0615  Weight: 75.252 kg (165 lb 14.4 oz)    Exam:   General:  Overweight female, No acute distress  HEENT:  NCAT, MMM, face and neck less swollen today  Cardiovascular:  RRR, nl S1, S2, 2/6 systolic murmur, LSB, 2+ pulses, warm extremities  Respiratory:  CTAB, no increased WOB  Abdomen:   NABS, soft, NT/ND  MSK:   Normal tone and bulk, trace bilateral LEE, 1+ pitting edema of bilateral hands  Neuro:  Grossly moves all extremities  Data Reviewed: Basic Metabolic Panel:  Recent Labs Lab 07/04/14 1555 07/05/14 0600 07/06/14 0440 07/07/14 1100 07/07/14 1310 07/08/14 1029  NA  --  139 138 143 141 142  K  --  3.7 3.5 4.3 4.1 4.0  CL  --  110 108 108 109 109  CO2  --  25 20 18* 19 21  GLUCOSE  --  161* 154* 212* 201* 164*  BUN  --  7 9 9 9 11   CREATININE  --  2.48* 2.32* 2.42* 2.31* 2.21*  CALCIUM  --  9.4 9.6 10.4 10.1 10.4  MG 1.6  --   --   --   --  1.9   Liver Function Tests:  Recent Labs Lab 07/05/14 0600 07/07/14 1100 07/07/14 1310 07/08/14 1029  AST 12 23 23  46*  ALT 12 14 16  43*  ALKPHOS 47 55 52 55  BILITOT 0.4 0.5 0.7 0.7  PROT 4.9* 6.5 6.6 6.2  ALBUMIN 2.6* 3.3* 3.1* 3.1*    Recent Labs Lab 07/07/14 1100  LIPASE 20   No results for input(s): AMMONIA in the last 168 hours. CBC:  Recent Labs Lab 07/04/14 0920 07/07/14 0600 07/07/14 1310 07/08/14 0615  WBC 8.0 9.8 10.4 13.6*  NEUTROABS  --   --  8.9*  --   HGB 9.4* 10.5* 10.4* 10.5*  HCT 27.7* 30.2* 30.3* 30.1*  MCV 89.1 87.0 87.8  86.7  PLT 230 296 323 398   Cardiac Enzymes: No results for input(s): CKTOTAL, CKMB, CKMBINDEX, TROPONINI in the last 168 hours. BNP (last 3 results) No results for input(s): BNP in the last 8760 hours.  ProBNP (last 3 results)  Recent Labs  08/27/13 2205 08/28/13 0454  PROBNP 263.1* 251.0*    CBG:  Recent Labs Lab 07/07/14 1616 07/07/14 2213 07/08/14 0023 07/08/14 0604 07/08/14 1157  GLUCAP 177* 145* 149* 163* 147*    No results found for this or any previous visit (from the past 240 hour(s)).   Studies: Ct Abdomen Pelvis Wo Contrast  07/07/2014   CLINICAL DATA:  Right and left upper quadrant pain. Projectile vomiting. Nausea, hematemesis. History of gastroenteritis, Crohn's, peptic ulcer disease, esophageal stricture.  EXAM: CT ABDOMEN AND PELVIS WITHOUT CONTRAST  TECHNIQUE: Multidetector CT imaging of the abdomen and pelvis was performed following the standard protocol without IV contrast.  COMPARISON:  CT of the abdomen and pelvis on 05/16/2014  FINDINGS: Lower chest: There is bibasilar atelectasis in the lung bases. The heart is enlarged in the areas significant coronary artery calcification.  Upper abdomen: No focal abnormality identified within the liver, spleen, pancreas, or adrenal glands. The low-attenuation lesion within the lateral portion of the right kidney is stable in appearance and consistent with a small cyst. The left kidney has a normal appearance. Ureters are unremarkable in appearance.  Gastrointestinal tract: The stomach wall appears diffusely thickened, particularly within the antral region with the wall measures 2.1 cm. This represents a new finding since the previous exam. There is no obstruction to contrast which passes into the nondilated loops of small bowel. Large bowel loops are nondilated but there is significant stool burden. The appendix is well seen and has a normal appearance.  Pelvis: There is a low-attenuation structure adjacent to the vaginal  cuff, measuring 6.4 x 5.2 cm. Previously this measured 7.3 x 7.8 cm. This mass is low-attenuation consistent with cystic contents. A Foley catheter is identified within the bladder and is probably been clamped given the presence of fluid within the bladder. There is no free pelvic fluid.  Retroperitoneum: No retroperitoneal or mesenteric adenopathy. There is significant atherosclerotic calcification of the abdominal aorta.  Abdominal wall: A supraumbilical fat containing hernia measures 4.0 cm. This does not involve bowel loops in appears uncomplicated.Numerous soft tissue nodules are identified throughout the anterior abdominal wall. 2 sites in the right lower abdominal wall contain a small locular gas.  The patient may be receiving numerous subcutaneous injections and correlation is recommended with history of injection sites.  Osseous structures: There is moderate degenerative change within the lumbar spine.  IMPRESSION: 1. Marked thickening of the stomach wall, particularly within the antral region. Considerations include gastritis, peptic ulcer disease. Metastases can have a similar appearance in the setting of known malignancy. The finding would be atypical for primary gastric malignancy. 2. Cardiomegaly and coronary artery disease. 3. Small right renal cyst, stable in appearance. 4. Low-attenuation pelvic structure, favoring right adnexal cyst, 6.4 cm. This is not completely characterized. Consider further evaluation with pelvic ultrasound. 5. Stable fat containing anterior abdominal wall hernia.   Electronically Signed   By: Nolon Nations M.D.   On: 07/07/2014 19:36   Dg Chest Port 1 View  07/07/2014   CLINICAL DATA:  Right upper quadrant pain  EXAM: PORTABLE CHEST - 1 VIEW  COMPARISON:  06/19/2014  FINDINGS: Cardiac shadow is within normal limits. A new right-sided PICC line is noted in satisfactory position at the cavoatrial junction. The lungs are well aerated bilaterally. No focal infiltrate or  sizable effusion is seen. No bony abnormality is noted.  IMPRESSION: PICC line in satisfactory position.  No acute abnormality noted.   Electronically Signed   By: Inez Catalina M.D.   On: 07/07/2014 13:19   Dg Abd Portable 1v  07/07/2014   CLINICAL DATA:  Persistent vomiting.  EXAM: PORTABLE ABDOMEN - 1 VIEW  COMPARISON:  CT 05/16/2014  FINDINGS: Prior cholecystectomy. Nonobstructive bowel gas pattern. No free air organomegaly. No suspicious calcification. No acute bony abnormality.  IMPRESSION: No obstruction or free air.   Electronically Signed   By: Rolm Baptise M.D.   On: 07/07/2014 09:34    Scheduled Meds: . ceFEPime (MAXIPIME) IV  1 g Intravenous Q24H  . feeding supplement (ENSURE ENLIVE)  237 mL Oral BID BM  . hydrALAZINE  10 mg Intravenous Q6H  . insulin aspart  0-9 Units Subcutaneous 4 times per day  . metronidazole  500 mg Intravenous Q8H  . pantoprazole  40 mg Oral BID AC  . sodium chloride  1,000 mL Intravenous Once  . sodium chloride  10-40 mL Intracatheter Q12H  . sodium chloride  3 mL Intravenous Q12H   Continuous Infusions: . sodium chloride Stopped (07/08/14 1428)    Active Problems:   Crohn disease   Accelerated hypertension   AKI (acute kidney injury)   Pelvic mass   Diastolic CHF   Metabolic acidosis   Sepsis   CVA (cerebral infarction)   Thrombotic cerebral infarction   Bacterial meningitis   Bradycardia   Hematemesis with nausea   Nausea with vomiting   Abnormal CT scan, stomach    Time spent: 30 min    Fannye Myer, [facility] Pager [phone]. If 7PM-7AM, please contact night-coverage at [email or web address], password [facility] 07/08/2014, 3:05 PM  LOS: 1 day

## 2014-07-08 NOTE — Anesthesia Postprocedure Evaluation (Signed)
  Anesthesia Post-op Note  Patient: Michelle Farrell  Procedure(s) Performed: Procedure(s): ESOPHAGOGASTRODUODENOSCOPY (EGD) (N/A)  Patient Location: Endoscopy Unit  Anesthesia Type:MAC  Level of Consciousness: awake  Airway and Oxygen Therapy: Patient Spontanous Breathing  Post-op Pain: none  Post-op Assessment: Post-op Vital signs reviewed, Patient's Cardiovascular Status Stable, Respiratory Function Stable, Patent Airway, No signs of Nausea or vomiting and Pain level controlled  Post-op Vital Signs: Reviewed  Last Vitals:  Filed Vitals:   07/08/14 1544  BP: 204/69  Pulse: 88  Temp: 36.7 C  Resp: 16    Complications: No apparent anesthesia complications

## 2014-07-09 ENCOUNTER — Encounter (HOSPITAL_COMMUNITY): Payer: Self-pay | Admitting: Gastroenterology

## 2014-07-09 DIAGNOSIS — I5032 Chronic diastolic (congestive) heart failure: Secondary | ICD-10-CM

## 2014-07-09 LAB — CBC
HCT: 23.6 % — ABNORMAL LOW (ref 36.0–46.0)
Hemoglobin: 8 g/dL — ABNORMAL LOW (ref 12.0–15.0)
MCH: 30 pg (ref 26.0–34.0)
MCHC: 33.9 g/dL (ref 30.0–36.0)
MCV: 88.4 fL (ref 78.0–100.0)
PLATELETS: 220 10*3/uL (ref 150–400)
RBC: 2.67 MIL/uL — ABNORMAL LOW (ref 3.87–5.11)
RDW: 16 % — AB (ref 11.5–15.5)
WBC: 11.5 10*3/uL — AB (ref 4.0–10.5)

## 2014-07-09 LAB — COMPREHENSIVE METABOLIC PANEL
ALK PHOS: 41 U/L (ref 39–117)
ALT: 43 U/L — ABNORMAL HIGH (ref 0–35)
ANION GAP: 11 (ref 5–15)
AST: 41 U/L — ABNORMAL HIGH (ref 0–37)
Albumin: 2.4 g/dL — ABNORMAL LOW (ref 3.5–5.2)
BILIRUBIN TOTAL: 0.3 mg/dL (ref 0.3–1.2)
BUN: 17 mg/dL (ref 6–23)
CO2: 23 mmol/L (ref 19–32)
CREATININE: 2.44 mg/dL — AB (ref 0.50–1.10)
Calcium: 9.5 mg/dL (ref 8.4–10.5)
Chloride: 106 mmol/L (ref 96–112)
GFR calc Af Amer: 22 mL/min — ABNORMAL LOW (ref >90)
GFR calc non Af Amer: 19 mL/min — ABNORMAL LOW (ref >90)
GLUCOSE: 158 mg/dL — AB (ref 70–99)
Potassium: 3.8 mmol/L (ref 3.5–5.1)
SODIUM: 140 mmol/L (ref 135–145)
Total Protein: 5 g/dL — ABNORMAL LOW (ref 6.0–8.3)

## 2014-07-09 LAB — GLUCOSE, CAPILLARY
GLUCOSE-CAPILLARY: 275 mg/dL — AB (ref 70–99)
Glucose-Capillary: 144 mg/dL — ABNORMAL HIGH (ref 70–99)
Glucose-Capillary: 150 mg/dL — ABNORMAL HIGH (ref 70–99)
Glucose-Capillary: 247 mg/dL — ABNORMAL HIGH (ref 70–99)

## 2014-07-09 LAB — MAGNESIUM: MAGNESIUM: 1.9 mg/dL (ref 1.5–2.5)

## 2014-07-09 MED ORDER — PANTOPRAZOLE SODIUM 40 MG PO TBEC
40.0000 mg | DELAYED_RELEASE_TABLET | Freq: Two times a day (BID) | ORAL | Status: DC
  Administered 2014-07-09 – 2014-07-14 (×12): 40 mg via ORAL
  Filled 2014-07-09 (×8): qty 1

## 2014-07-09 MED ORDER — SODIUM CHLORIDE 0.9 % IJ SOLN
10.0000 mL | INTRAMUSCULAR | Status: AC | PRN
  Administered 2014-07-09: 10 mL

## 2014-07-09 MED FILL — Sodium Chloride Flush IV Soln 0.9%: INTRAVENOUS | Qty: 10 | Status: AC

## 2014-07-09 NOTE — Progress Notes (Addendum)
Inpatient Rehabilitation  Pt. Is familiar to me from her prior acute stay as well as her IP Rehab admission.  I have discussed with the rehab team and the patient will not need to  return to [facility] following this acute admission.  I have talked with the patient at the bedside and also phoned her granddaughter Bartolo Darter to provide this update.  I spoke with Debbie in CM and updated her.  I also left a voice mail with Barbette Or, SW  (who seems to be covering for Liz Beach).      Please call if questions.  I will sign off.  [place] Admissions Coordinator Cell [phone] Office [phone]

## 2014-07-09 NOTE — Progress Notes (Signed)
PROGRESS NOTE  Michelle Farrell GXQ:[id number] DOB: Feb 17, 1941 DOA: 07/07/2014 PCP: Hermelinda Medicus, MD  HPI/Recap of past 24 hours: The patient is a 74 y.o. 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 She had admissions in February and March of this year for gastroenteritis symptoms (n/v/d). She returned to the hospital on 06/19/14 with fever, altered mental status, and severe sepsis. There was concern for meningitis. She was found to have a UTI and a CVA. She was evaluated by neurology and infectious disease, stabilized and was transferred to [place] on 06/27/2014 on vancomycin and ceftriaxone. Infectious Disease continued to follow her in CIR. On 4/8, she was found to have acute renal failure and a vancomycin trough level of 47. She was also on lisinopril and spironolactone. She was also noted to be bradycardic. She was started on IV fluids, her ACE inhibitor and spironolactone were discontinued, and she had a mild improvement in her kidney function. For bradycardia, her clonidine was discontinued and she was started on hydralazine. On 4/10 into 4/11, she developed epigastric pain with intractable nausea and vomiting. She had bilious and bloody emesis. She was readmitted for further evaluation. Labs were concerning for a positive gap metabolic acidosis, rising creatinine, decreasing hemoglobin. She developed a low-grade fever with tachycardia. Her blood pressure was elevated. For her vomiting, she underwent EGD on 4/12 which demonstrated esophagitis, gastritis, and duodenitis. Her temperature is starting to trend down with broader spectrum antibiotics.  Seen by inpatient rehabilitation who do not feel that she needs to return back to them. Culture still pending.  Assessment/Plan: Active Problems:   Crohn disease   Accelerated hypertension: Stable.  On BiDil 3 times a day. Clonidine held secondary to bradycardia   AKI (acute kidney injury) secondary to drug-induced the setting of dehydration plus ACE inhibitor. Slightly elevated today at 2.4 forceful increased IV fluids. May be patient's new baseline. Appreciate nephrology consultation   Pelvic mass: Malignancy versus bladder diverticulum versus cyst. For outpatient pelvic ultrasound    Diastolic CHF: Stable continue to monitor closely.   Metabolic acidosis: Resolved, likely the setting of uremia from renal failure    Sepsis/fever while on an about eczema setting of recent meningitis. Evaluated during previous admission by infectious disease. No obvious source. Suspect this could be vancomycin toxicity and tremor related fever. Neck minus discontinued from acute kidney injury and vancomycin level down to 20. On cefepime plus Flagyl. Lactic acid level within normal limits    Bacterial meningitis   Bradycardia: Resolved. Secondary to clonidine which has been discontinued. TSH normal.    Esophagitis /gastritis/duodenitis causing intractable nausea/vomiting/hematemesis: Status post EGD. Continue permanent twice a day PPI. Currently on full liquids, advanced to solid. Hemoglobin remained stable   Code Status: Full  Family Communication: Left msg with daughter  Disposition Plan: Does not need inpatient rehabilitation. Home once renal function stabilizes and patient tolerating solid food   Consultants:  [facility]-[place]  Nephrology  Procedures:  None  Antibiotics:  IV Flagyl 4/12-present  IV Cefepime 4/12-present   Objective: BP 122/47 mmHg  Pulse 72  Temp(Src) 99.2 F (37.3 C) (Oral)  Resp 20  Ht 5' 6"  (1.676 m)  Wt 75.524 kg (166 lb 8 oz)  BMI 26.89 kg/m2  SpO2 98%  Intake/Output Summary (Last 24 hours) at 07/09/14 1719 Last data filed at 07/09/14 0943  Gross per 24 hour  Intake    960 ml  Output    775 ml  Net    185 ml   Filed Weights   07/08/14 0615 07/09/14  0500  Weight: 75.252 kg (165 lb 14.4 oz) 75.524 kg (166 lb 8 oz)    Exam:   General:  Alert & oriented x2, NAD  Cardiovascular: RRR S1S2  Respiratory: CTA Bilaterally  Abdomen: Soft, NT, ND, +BS  Musculoskeletal: Tr edema  Data Reviewed: Basic Metabolic Panel:  Recent Labs Lab 07/04/14 1555  07/06/14 0440 07/07/14 1100 07/07/14 1310 07/08/14 1029 07/09/14 0500  NA  --   < > 138 143 141 142 140  K  --   < > 3.5 4.3 4.1 4.0 3.8  CL  --   < > 108 108 109 109 106  CO2  --   < > 20 18* 19 21 23   GLUCOSE  --   < > 154* 212* 201* 164* 158*  BUN  --   < > 9 9 9 11 17   CREATININE  --   < > 2.32* 2.42* 2.31* 2.21* 2.44*  CALCIUM  --   < > 9.6 10.4 10.1 10.4 9.5  MG 1.6  --   --   --   --  1.9 1.9  < > = values in this interval not displayed. Liver Function Tests:  Recent Labs Lab 07/05/14 0600 07/07/14 1100 07/07/14 1310 07/08/14 1029 07/09/14 0500  AST 12 23 23  46* 41*  ALT 12 14 16  43* 43*  ALKPHOS 47 55 52 55 41  BILITOT 0.4 0.5 0.7 0.7 0.3  PROT 4.9* 6.5 6.6 6.2 5.0*  ALBUMIN 2.6* 3.3* 3.1* 3.1* 2.4*    Recent Labs Lab 07/07/14 1100  LIPASE 20   No results for input(s): AMMONIA in the last 168 hours. CBC:  Recent Labs Lab 07/04/14 0920 07/07/14 0600 07/07/14 1310 07/08/14 0615 07/09/14 0500  WBC 8.0 9.8 10.4 13.6* 11.5*  NEUTROABS  --   --  8.9*  --   --   HGB 9.4* 10.5* 10.4* 10.5* 8.0*  HCT 27.7* 30.2* 30.3* 30.1* 23.6*  MCV 89.1 87.0 87.8 86.7 88.4  PLT 230 296 323 398 220   Cardiac Enzymes:   No results for input(s): CKTOTAL, CKMB, CKMBINDEX, TROPONINI in the last 168 hours. BNP (last 3 results) No results for input(s): BNP in the last 8760 hours.  ProBNP (last 3 results)  Recent Labs  08/27/13 2205 08/28/13 0454  PROBNP 263.1* 251.0*    CBG:  Recent Labs Lab 07/08/14 1157 07/08/14 1820 07/08/14 2358 07/09/14 0551 07/09/14 1154  GLUCAP 147* 174* 150* 144* 247*    Recent Results (from the past 240 hour(s))  Culture,  blood (x 2)     Status: None (Preliminary result)   Collection Time: 07/07/14  6:30 PM  Result Value Ref Range Status   Specimen Description BLOOD LEFT ANTECUBITAL  Final   Special Requests BOTTLES DRAWN AEROBIC AND ANAEROBIC 5CC  Final   Culture   Final           BLOOD CULTURE RECEIVED NO GROWTH TO DATE CULTURE WILL BE HELD FOR 5 DAYS BEFORE ISSUING A FINAL NEGATIVE REPORT Performed at [facility]    Report Status PENDING  Incomplete  Culture, blood (x 2)     Status: None (Preliminary result)   Collection Time: 07/07/14  6:45 PM  Result Value Ref Range Status   Specimen Description BLOOD LEFT HAND  Final   Special Requests BOTTLES DRAWN AEROBIC AND ANAEROBIC 5CC  Final   Culture   Final           BLOOD CULTURE RECEIVED NO GROWTH TO DATE CULTURE WILL BE HELD FOR 5 DAYS BEFORE ISSUING A FINAL NEGATIVE REPORT Performed at [facility]    Report Status PENDING  Incomplete     Studies: No results found.  Scheduled Meds: . ceFEPime (MAXIPIME) IV  1 g Intravenous Q24H  . feeding supplement (ENSURE ENLIVE)  237 mL Oral BID BM  . insulin aspart  0-9 Units Subcutaneous 4 times per day  . isosorbide-hydrALAZINE  1 tablet Oral TID  . labetalol  100 mg Oral TID  . magnesium chloride  2 tablet Oral BID  . metronidazole  500 mg Intravenous Q8H  . pantoprazole  40 mg Oral BID AC  . sodium chloride  1,000 mL Intravenous Once  . sodium chloride  10-40 mL Intracatheter Q12H  . sodium chloride  3 mL Intravenous Q12H  . vitamin B-12  1,000 mcg Oral Daily    Continuous Infusions: . sodium chloride 75 mL/hr at 07/09/14 1419     Time spent: 25 minutes  [facility] Pager [phone]. If 7PM-7AM, please contact night-coverage at [email or web address], password [facility] 07/09/2014, 5:19 PM  LOS: 2 days

## 2014-07-09 NOTE — Care Management Note (Addendum)
    Page 1 of 1   07/14/2014     10:52:03 AM CARE MANAGEMENT NOTE 07/14/2014  Patient:  Michelle Farrell, Michelle Farrell   Account Number:  [id number]  Date Initiated:  07/09/2014  Documentation initiated by:  Michelle Farrell  Subjective/Objective Assessment:   admitted to unit from CIR. Pt had reaction to medication with Kidney impairment. Granddaughter is primary support     Action/Plan:   will not return to CIR per admission coordinator. will work toward placement or home resources with rec from MD and grandaughter.   Anticipated DC Date:  07/11/2014   Anticipated DC Plan:  [place]  CM consult      [facility] Choice  HOME HEALTH   Choice offered to / List presented to:             Status of service:  Completed, signed off Medicare Important Message given?  YES (If response is "NO", the following Medicare IM given date fields will be blank) Date Medicare IM given:  07/10/2014 Medicare IM given by:  Michelle Farrell Date Additional Medicare IM given:  07/14/2014 Additional Medicare IM given by:  [facility] SWIST  Discharge Disposition:  [place]  Per UR Regulation:  Reviewed for med. necessity/level of care/duration of stay  If discussed at Long Length of Stay Meetings, dates discussed:    Comments:  4-18 16 OM letter given, will follow for any CM needs. SNF placement excepted at this time. Michelle Collet RN BSN CM 07-10-14 IM letter given will follow for discharge needs. Michelle Collet RN BSN CM 07-09-14. Will follow for discharge needs as they become clear. Assessing for disposition now that pt cleared and not returning to CIR. Michelle Collet RN BSN CM [place] daughter- [name] "[employer]" is a Designer, jewellery. Wants to be involved in grandmother's care. May prefer [facility] over SNF. Michelle Collet RN BSN CM

## 2014-07-10 DIAGNOSIS — K2990 Gastroduodenitis, unspecified, without bleeding: Secondary | ICD-10-CM

## 2014-07-10 DIAGNOSIS — K2970 Gastritis, unspecified, without bleeding: Secondary | ICD-10-CM

## 2014-07-10 LAB — COMPREHENSIVE METABOLIC PANEL
ALBUMIN: 2.5 g/dL — AB (ref 3.5–5.2)
ALT: 31 U/L (ref 0–35)
AST: 23 U/L (ref 0–37)
Alkaline Phosphatase: 45 U/L (ref 39–117)
Anion gap: 9 (ref 5–15)
BUN: 16 mg/dL (ref 6–23)
CALCIUM: 9.3 mg/dL (ref 8.4–10.5)
CO2: 22 mmol/L (ref 19–32)
Chloride: 107 mmol/L (ref 96–112)
Creatinine, Ser: 2.21 mg/dL — ABNORMAL HIGH (ref 0.50–1.10)
GFR calc Af Amer: 24 mL/min — ABNORMAL LOW (ref >90)
GFR, EST NON AFRICAN AMERICAN: 21 mL/min — AB (ref >90)
Glucose, Bld: 145 mg/dL — ABNORMAL HIGH (ref 70–99)
Potassium: 3.5 mmol/L (ref 3.5–5.1)
SODIUM: 138 mmol/L (ref 135–145)
Total Bilirubin: 0.5 mg/dL (ref 0.3–1.2)
Total Protein: 5 g/dL — ABNORMAL LOW (ref 6.0–8.3)

## 2014-07-10 LAB — MAGNESIUM: Magnesium: 1.9 mg/dL (ref 1.5–2.5)

## 2014-07-10 LAB — CBC
HCT: 23.1 % — ABNORMAL LOW (ref 36.0–46.0)
Hemoglobin: 8.2 g/dL — ABNORMAL LOW (ref 12.0–15.0)
MCH: 30.6 pg (ref 26.0–34.0)
MCHC: 35.5 g/dL (ref 30.0–36.0)
MCV: 86.2 fL (ref 78.0–100.0)
Platelets: 205 10*3/uL (ref 150–400)
RBC: 2.68 MIL/uL — AB (ref 3.87–5.11)
RDW: 15.5 % (ref 11.5–15.5)
WBC: 11.8 10*3/uL — ABNORMAL HIGH (ref 4.0–10.5)

## 2014-07-10 LAB — GLUCOSE, CAPILLARY
GLUCOSE-CAPILLARY: 131 mg/dL — AB (ref 70–99)
GLUCOSE-CAPILLARY: 193 mg/dL — AB (ref 70–99)
Glucose-Capillary: 153 mg/dL — ABNORMAL HIGH (ref 70–99)
Glucose-Capillary: 158 mg/dL — ABNORMAL HIGH (ref 70–99)
Glucose-Capillary: 180 mg/dL — ABNORMAL HIGH (ref 70–99)

## 2014-07-10 MED ORDER — INSULIN ASPART 100 UNIT/ML ~~LOC~~ SOLN: 0.0000 [IU] | Freq: Every day | SUBCUTANEOUS | Status: DC

## 2014-07-10 MED ORDER — INSULIN ASPART 100 UNIT/ML ~~LOC~~ SOLN
0.0000 [IU] | Freq: Three times a day (TID) | SUBCUTANEOUS | Status: DC
  Administered 2014-07-10 – 2014-07-11 (×2): 2 [IU] via SUBCUTANEOUS
  Administered 2014-07-11: 1 [IU] via SUBCUTANEOUS
  Administered 2014-07-11: 2 [IU] via SUBCUTANEOUS
  Administered 2014-07-12: 1 [IU] via SUBCUTANEOUS
  Administered 2014-07-12: 5 [IU] via SUBCUTANEOUS
  Administered 2014-07-12: 1 [IU] via SUBCUTANEOUS
  Administered 2014-07-13: 2 [IU] via SUBCUTANEOUS
  Administered 2014-07-13: 3 [IU] via SUBCUTANEOUS
  Administered 2014-07-13: 1 [IU] via SUBCUTANEOUS
  Administered 2014-07-14: 2 [IU] via SUBCUTANEOUS
  Administered 2014-07-14 (×2): 1 [IU] via SUBCUTANEOUS

## 2014-07-10 NOTE — Evaluation (Signed)
Occupational Therapy Evaluation Patient Details Name: Quintana Canelo MRN: [id number] DOB: 14-Apr-1940 Today's Date: 07/10/2014    History of Present Illness 74 y.o. female with a history of Crohn's disease, Diastolic CHF, HTN, IDDM, and Hyperlipidemia, COPD, neuropathy, chronic back pain admitted again for epigastric pain and intractable N/V/D.  Upper GI showed esophagitis, gastritis and duodenitis.  Work up continues.   Clinical Impression   Pt admitted with N/V.D. Pt currently with functional limitations due to the deficits listed below (see OT Problem List).  Pt will benefit from skilled OT to increase their safety and independence with ADL and functional mobility for ADL to facilitate discharge to venue listed below.      Follow Up Recommendations  SNF;Home health OT;Supervision/Assistance - 24 hour (depending on family abillity to A)    Equipment Recommendations  3 in 1 bedside comode       Precautions / Restrictions Precautions Precautions: Fall Precaution Comments: pt not listening well to cues/commands given. Restrictions Weight Bearing Restrictions: No      Mobility Bed Mobility Overal bed mobility: Needs Assistance Bed Mobility: Supine to Sit;Sit to Supine     Supine to sit: Min guard Sit to supine: Min guard   General bed mobility comments: No physical assist needed but min guard for safety. Increased time required.   Transfers Overall transfer level: Needs assistance Equipment used: 1 person hand held assist Transfers: Sit to/from Stand Sit to Stand: Min guard         General transfer comment: cues for better safety.    Balance Overall balance assessment: Needs assistance Sitting-balance support: No upper extremity supported Sitting balance-Leahy Scale: Good       Standing balance-Leahy Scale: Fair                              ADL Overall ADL's : Needs assistance/impaired     Grooming: Set up;Sitting   Upper Body Bathing: Set  up;Sitting   Lower Body Bathing: Moderate assistance;Sit to/from stand   Upper Body Dressing : Set up;Sitting   Lower Body Dressing: Moderate assistance;Sit to/from stand   Toilet Transfer: Minimal assistance;Stand-pivot;BSC   Toileting- Clothing Manipulation and Hygiene: Moderate assistance;Sit to/from stand               Vision Vision Assessment?: No apparent visual deficits          Pertinent Vitals/Pain Pain Assessment: No/denies pain Pain Score: 0-No pain     Hand Dominance Right   Extremity/Trunk Assessment Upper Extremity Assessment Upper Extremity Assessment: Generalized weakness   Lower Extremity Assessment Lower Extremity Assessment: Overall WFL for tasks assessed       Communication Communication Communication: No difficulties   Cognition Arousal/Alertness: Awake/alert Behavior During Therapy: WFL for tasks assessed/performed Overall Cognitive Status: Within Functional Limits for tasks assessed Area of Impairment: Safety/judgement;Following commands;Problem solving   Current Attention Level: Sustained   Following Commands: Follows one step commands inconsistently     Problem Solving: Slow processing;Decreased initiation;Difficulty sequencing;Requires verbal cues;Requires tactile cues     General Comments    Spoke with granddaughter regarding HH versus SNF           Home Living Family/patient expects to be discharged to:: Private residence Living Arrangements: Other relatives (pt states greatgranddaughter lives with her (school age)) Available Help at Discharge: Family;Other (Comment) (daughter helps out, not sure how much.) Type of Home: Apartment (pt states 1 story house with no steps.) Home Access: Stairs  to enter Entrance Stairs-Number of Steps: 3   Home Layout: One level               Home Equipment: Cane - single point;Walker - 2 wheels      Lives With: Other (Comment) (with great granddaughter)    Prior  Functioning/Environment Level of Independence: Independent        Comments: has RW and cane but doesn't use per daughter    OT Diagnosis: Generalized weakness   OT Problem List: Decreased activity tolerance;Decreased strength   OT Treatment/Interventions: Self-care/ADL training;Patient/family education    OT Goals(Current goals can be found in the care plan section) Acute Rehab OT Goals Patient Stated Goal: go home OT Goal Formulation: With patient Time For Goal Achievement: 07/24/14 Potential to Achieve Goals: Good  OT Frequency:             Co-evaluation              End of Session Nurse Communication: Mobility status  Activity Tolerance: Patient limited by fatigue Patient left: in chair;with call bell/phone within reach;with chair alarm set;with family/visitor present   Time: 5615-3794 OT Time Calculation (min): 34 min Charges:  OT General Charges $OT Visit: 1 Procedure OT Evaluation $Initial OT Evaluation Tier I: 1 Procedure OT Treatments $Self Care/Home Management : 8-22 mins G-Codes:    Payton Mccallum D 2014-07-22, 12:31 PM

## 2014-07-10 NOTE — Progress Notes (Signed)
ANTIBIOTIC CONSULT NOTE  Pharmacy Consult for cefepime Indication: rule out sepsis  No Known Allergies  Patient Measurements: Height: 5' 6"  (167.6 cm) Weight: 167 lb 8.8 oz (76 kg) IBW/kg (Calculated) : 59.3   Vital Signs: Temp: 99.2 F (37.3 C) (04/14 0543) Temp Source: Oral (04/14 0543) BP: 123/47 mmHg (04/14 0543) Pulse Rate: 69 (04/14 0543) Intake/Output from previous day: 04/13 0701 - 04/14 0700 In: 961.3 [P.O.:360; I.V.:351.3; IV Piggyback:250] Out: 825 [Urine:825] Intake/Output from this shift: Total I/O In: 200 [P.O.:200] Out: -   Labs:  Recent Labs  07/08/14 0615 07/08/14 1029 07/09/14 0500 07/10/14 0946  WBC 13.6*  --  11.5* 11.8*  HGB 10.5*  --  8.0* 8.2*  PLT 398  --  220 205  CREATININE  --  2.21* 2.44* 2.21*   Estimated Creatinine Clearance: 24 mL/min (by C-G formula based on Cr of 2.21).  Recent Labs  07/07/14 1305  VANCORANDOM 19.9     Microbiology: Recent Results (from the past 720 hour(s))  Blood Culture (routine x 2)     Status: None   Collection Time: 06/19/14 12:23 PM  Result Value Ref Range Status   Specimen Description BLOOD LEFT ANTECUBITAL  Final   Special Requests BOTTLES DRAWN AEROBIC AND ANAEROBIC 3CC  Final   Culture   Final    NO GROWTH 5 DAYS Performed at [facility]    Report Status 06/25/2014 FINAL  Final  Blood Culture (routine x 2)     Status: None   Collection Time: 06/19/14 12:36 PM  Result Value Ref Range Status   Specimen Description BLOOD LEFT ANTECUBITAL  Final   Special Requests BOTTLES DRAWN AEROBIC AND ANAEROBIC 5ML  Final   Culture   Final    NO GROWTH 5 DAYS Performed at [facility]    Report Status 06/25/2014 FINAL  Final  Urine culture     Status: None   Collection Time: 06/19/14  2:32 PM  Result Value Ref Range Status   Specimen Description URINE, CLEAN CATCH  Final   Special Requests NONE  Final   Colony Count   Final    80,000 COLONIES/ML Performed at [facility]    Culture   Final    ESCHERICHIA COLI Performed at [facility]    Report Status 06/21/2014 FINAL  Final   Organism ID, Bacteria ESCHERICHIA COLI  Final      Susceptibility   Escherichia coli - MIC*    AMPICILLIN >=32 RESISTANT Resistant     CEFAZOLIN >=64 RESISTANT Resistant     CEFTRIAXONE <=1 SENSITIVE Sensitive     CIPROFLOXACIN >=4 RESISTANT Resistant     GENTAMICIN <=1 SENSITIVE Sensitive     LEVOFLOXACIN >=8 RESISTANT Resistant     NITROFURANTOIN <=16 SENSITIVE Sensitive     TOBRAMYCIN <=1 SENSITIVE Sensitive     TRIMETH/SULFA >=320 RESISTANT Resistant     PIP/TAZO >=128 RESISTANT Resistant     * ESCHERICHIA COLI  MRSA PCR Screening     Status: None   Collection Time: 06/19/14  8:30 PM  Result Value Ref Range Status   MRSA by PCR NEGATIVE NEGATIVE Final    Comment:        The GeneXpert MRSA Assay (FDA approved for NASAL specimens only), is one component of a comprehensive MRSA colonization surveillance program. It is not intended to diagnose MRSA infection nor to guide or monitor treatment for MRSA infections.   CSF culture     Status: None  Collection Time: 06/20/14 11:33 AM  Result Value Ref Range Status   Specimen Description CSF  Final   Special Requests NONE  Final   Gram Stain   Final    FEW WBC PRESENT, PREDOMINANTLY PMN NO ORGANISMS SEEN Gram Stain Report Called to,Read Back By and Verified With: Gram Stain Report Called to,Read Back By and Verified With: ALLISON DUNDON [id number]@1 :00AM BJENN Performed at [facility]   Final    NO GROWTH 3 DAYS Performed at [facility]    Report Status 06/24/2014 FINAL  Final  Gram stain     Status: None   Collection Time: 06/20/14 11:33 AM  Result Value Ref Range Status   Specimen Description CSF  Final   Special Requests NONE  Final   Gram Stain   Final    CYTOSPIN PREP WBC PRESENT, PREDOMINANTLY PMN NO ORGANISMS SEEN    Report Status 06/20/2014 FINAL  Final   Culture, blood (routine x 2)     Status: None   Collection Time: 06/24/14  6:44 PM  Result Value Ref Range Status   Specimen Description BLOOD LEFT HAND  Final   Special Requests BOTTLES DRAWN AEROBIC ONLY 3CC  Final   Culture   Final    NO GROWTH 5 DAYS Performed at [facility]    Report Status 07/01/2014 FINAL  Final  Culture, blood (routine x 2)     Status: None   Collection Time: 06/24/14  6:53 PM  Result Value Ref Range Status   Specimen Description BLOOD LEFT HAND  Final   Special Requests BOTTLES DRAWN AEROBIC AND ANAEROBIC 5CC  Final   Culture   Final    NO GROWTH 5 DAYS Performed at [facility]    Report Status 07/01/2014 FINAL  Final  Culture, Urine     Status: None   Collection Time: 06/24/14  7:50 PM  Result Value Ref Range Status   Specimen Description URINE, RANDOM  Final   Special Requests Normal  Final   Colony Count NO GROWTH Performed at [facility]   Final   Culture NO GROWTH Performed at [facility]   Final   Report Status 06/26/2014 FINAL  Final  Clostridium Difficile by PCR     Status: None   Collection Time: 06/26/14  8:19 PM  Result Value Ref Range Status   C difficile by pcr NEGATIVE NEGATIVE Final  Culture, blood (x 2)     Status: None (Preliminary result)   Collection Time: 07/07/14  6:30 PM  Result Value Ref Range Status   Specimen Description BLOOD LEFT ANTECUBITAL  Final   Special Requests BOTTLES DRAWN AEROBIC AND ANAEROBIC 5CC  Final   Culture   Final           BLOOD CULTURE RECEIVED NO GROWTH TO DATE CULTURE WILL BE HELD FOR 5 DAYS BEFORE ISSUING A FINAL NEGATIVE REPORT Performed at [facility]    Report Status PENDING  Incomplete  Culture, blood (x 2)     Status: None (Preliminary result)   Collection Time: 07/07/14  6:45 PM  Result Value Ref Range Status   Specimen Description BLOOD LEFT HAND  Final   Special Requests BOTTLES DRAWN AEROBIC AND ANAEROBIC 5CC  Final   Culture   Final            BLOOD CULTURE RECEIVED NO GROWTH TO DATE CULTURE WILL BE HELD FOR 5 DAYS BEFORE ISSUING A FINAL NEGATIVE  REPORT Performed at [facility]    Report Status PENDING  Incomplete    Assessment: 61 YOF transferred back from CIR after being there from 4/1-4/11 for rehab following CVA. She is readmitted with increased lethargy, N/V, and concern for sepsis and GIB.  She completed a 14 day course of CTX/vancomycin for concern of meningitis. She developed AKI and had an elevated vancomycin trough. Last dose of vancomycin given on 4/9, however due to AKI, trough remained elevated until 4/11, meaning she still had active drug in her system.  CTX 3/24>>3/27; 3/28>>4/11 Vanc 3/25>>4/11 (last dose given 4/9) Flagyl 4/11>> Cefepime 4/11>>  SCr down to 2.21 today, est CrCl ~20-31m/min. WBC 11.8, afebrile. Blood cultures have been sent, no growth to date.  Goal of Therapy:  proper dosing based on hepatic and renal function  Plan:  -cefepime 1g IV q24h -Flagyl 5040mIV q8h per MD (proper dosing) -follow c/s, clinical progression, renal function  Whitney Hillegass D. Hanish Laraia, PharmD, BCPS Clinical Pharmacist Pager: [phone]/14/2016 11:24 AM

## 2014-07-10 NOTE — Progress Notes (Signed)
PROGRESS NOTE  Michelle Farrell DXA:[id number] DOB: Dec 25, 1940 DOA: 07/07/2014 PCP: No primary care provider on file.  HPI/Recap of past 24 hours: The patient is a 74 y.o. 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 She had admissions in February and March of this year for gastroenteritis symptoms (n/v/d). She returned to the hospital on 06/19/14 with fever, altered mental status, and severe sepsis. There was concern for meningitis. She was found to have a UTI and a CVA. She was evaluated by neurology and infectious disease, stabilized and was transferred to [place] on 06/27/2014 on vancomycin and ceftriaxone. Infectious Disease continued to follow her in CIR. On 4/8, she was found to have acute renal failure and a vancomycin trough level of 47. She was also on lisinopril and spironolactone. She was also noted to be bradycardic. She was started on IV fluids, her ACE inhibitor and spironolactone were discontinued, and she had a mild improvement in her kidney function. For bradycardia, her clonidine was discontinued and she was started on hydralazine. On 4/10 into 4/11, she developed epigastric pain with intractable nausea and vomiting. She had bilious and bloody emesis. She was readmitted for further evaluation. Labs were concerning for a positive gap metabolic acidosis, rising creatinine, decreasing hemoglobin. She developed a low-grade fever with tachycardia. Her blood pressure was elevated. For her vomiting, she underwent EGD on 4/12 which demonstrated esophagitis, gastritis, and duodenitis. Her temperature is starting to trend down with broader spectrum antibiotics.  Seen by inpatient rehabilitation who do not feel that she needs to return back to them.  Patient about the same. Has no complaints.  Assessment/Plan: Active Problems:   Crohn disease  Accelerated hypertension: On BiDil 3 times a day. Clonidine held secondary to bradycardia. Blood pressures continue to remain stable    AKI (acute kidney injury) secondary to drug-induced the setting of dehydration plus ACE inhibitor. Following elevation of renal function yesterday, with IV fluids better today. Continue IV fluids and will recheck tomorrow. versus cyst. For outpatient pelvic ultrasound    Diastolic CHF: Stable continue to monitor closely.   Metabolic acidosis: Resolved, likely the setting of uremia from renal failure    Sepsis/fever while on an about eczema setting of recent meningitis. Evaluated during previous admission by infectious disease. No obvious source. Suspect this could be vancomycin toxicity and tremor related fever. Neck minus discontinued from acute kidney injury and vancomycin level down to 20. On cefepime plus Flagyl. Lactic acid level within normal limits    Bacterial meningitis   Bradycardia: Resolved. Secondary to clonidine which has been discontinued. TSH normal.    Esophagitis /gastritis/duodenitis causing intractable nausea/vomiting/hematemesis: Status post EGD. Continue permanent twice a day PPI. Currently on full liquids, advanced to solid. Hemoglobin remained stable   Code Status: Full  Family Communication:  spoke with daughter by phone Disposition: Possible short-term skilled nursing. Home once baseline renal function established    Consultants:  [facility]  Nephrology  Procedures:  None  Antibiotics:  IV Flagyl 4/12-present  IV Cefepime 4/12-present   Objective: BP 113/43 mmHg  Pulse 80  Temp(Src) 99.1 F (37.3 C) (Oral)  Resp 18  Ht 5' 6"  (1.676 m)  Wt 76 kg (167 lb 8.8 oz)  BMI 27.06 kg/m2  SpO2 100%  Intake/Output Summary (Last 24 hours) at 07/10/14 1806 Last data filed at 07/10/14 1405  Gross per 24 hour  Intake 1001.25 ml  Output  1025 ml  Net -23.75 ml   Filed Weights   07/08/14 0615 07/09/14 0500 07/10/14  0436  Weight: 75.252 kg (165 lb 14.4 oz) 75.524 kg (166 lb 8 oz) 76 kg (167 lb 8.8 oz)    Exam:   General:  Alert & oriented x2, NAD  Cardiovascular: RRR S1S2  Respiratory: CTA Bilaterally  Abdomen: Soft, NT, ND, +BS  Musculoskeletal: Tr edema  Data Reviewed: Basic Metabolic Panel:  Recent Labs Lab 07/04/14 1555  07/07/14 1100 07/07/14 1310 07/08/14 1029 07/09/14 0500 07/10/14 0946  NA  --   < > 143 141 142 140 138  K  --   < > 4.3 4.1 4.0 3.8 3.5  CL  --   < > 108 109 109 106 107  CO2  --   < > 18* 19 21 23 22   GLUCOSE  --   < > 212* 201* 164* 158* 145*  BUN  --   < > 9 9 11 17 16   CREATININE  --   < > 2.42* 2.31* 2.21* 2.44* 2.21*  CALCIUM  --   < > 10.4 10.1 10.4 9.5 9.3  MG 1.6  --   --   --  1.9 1.9 1.9  < > = values in this interval not displayed. Liver Function Tests:  Recent Labs Lab 07/07/14 1100 07/07/14 1310 07/08/14 1029 07/09/14 0500 07/10/14 0946  AST 23 23 46* 41* 23  ALT 14 16 43* 43* 31  ALKPHOS 55 52 55 41 45  BILITOT 0.5 0.7 0.7 0.3 0.5  PROT 6.5 6.6 6.2 5.0* 5.0*  ALBUMIN 3.3* 3.1* 3.1* 2.4* 2.5*    Recent Labs Lab 07/07/14 1100  LIPASE 20   No results for input(s): AMMONIA in the last 168 hours. CBC:  Recent Labs Lab 07/07/14 0600 07/07/14 1310 07/08/14 0615 07/09/14 0500 07/10/14 0946  WBC 9.8 10.4 13.6* 11.5* 11.8*  NEUTROABS  --  8.9*  --   --   --   HGB 10.5* 10.4* 10.5* 8.0* 8.2*  HCT 30.2* 30.3* 30.1* 23.6* 23.1*  MCV 87.0 87.8 86.7 88.4 86.2  PLT 296 323 398 220 205   Cardiac Enzymes:   No results for input(s): CKTOTAL, CKMB, CKMBINDEX, TROPONINI in the last 168 hours. BNP (last 3 results) No results for input(s): BNP in the last 8760 hours.  ProBNP (last 3 results)  Recent Labs  08/27/13 2205 08/28/13 0454  PROBNP 263.1* 251.0*    CBG:  Recent Labs Lab 07/09/14 1810 07/10/14 0009 07/10/14 0543 07/10/14 1144 07/10/14 1749  GLUCAP 275* 158* 131* 193* 153*    Recent Results (from the past  240 hour(s))  Culture, blood (x 2)     Status: None (Preliminary result)   Collection Time: 07/07/14  6:30 PM  Result Value Ref Range Status   Specimen Description BLOOD LEFT ANTECUBITAL  Final   Special Requests BOTTLES DRAWN AEROBIC AND ANAEROBIC 5CC  Final   Culture   Final           BLOOD CULTURE RECEIVED NO GROWTH TO DATE CULTURE WILL BE HELD FOR 5 DAYS BEFORE ISSUING A FINAL NEGATIVE REPORT Performed at [facility]    Report Status PENDING  Incomplete  Culture, blood (x 2)     Status: None (Preliminary result)   Collection Time: 07/07/14  6:45 PM  Result Value Ref Range Status   Specimen Description BLOOD LEFT HAND  Final   Special Requests BOTTLES DRAWN AEROBIC AND ANAEROBIC 5CC  Final  Culture   Final           BLOOD CULTURE RECEIVED NO GROWTH TO DATE CULTURE WILL BE HELD FOR 5 DAYS BEFORE ISSUING A FINAL NEGATIVE REPORT Performed at [facility]    Report Status PENDING  Incomplete     Studies: No results found.  Scheduled Meds: . ceFEPime (MAXIPIME) IV  1 g Intravenous Q24H  . feeding supplement (ENSURE ENLIVE)  237 mL Oral BID BM  . insulin aspart  0-5 Units Subcutaneous QHS  . [START ON 07/11/2014] insulin aspart  0-9 Units Subcutaneous TID WC  . isosorbide-hydrALAZINE  1 tablet Oral TID  . labetalol  100 mg Oral TID  . magnesium chloride  2 tablet Oral BID  . metronidazole  500 mg Intravenous Q8H  . pantoprazole  40 mg Oral BID AC  . sodium chloride  1,000 mL Intravenous Once  . sodium chloride  10-40 mL Intracatheter Q12H  . sodium chloride  3 mL Intravenous Q12H  . vitamin B-12  1,000 mcg Oral Daily    Continuous Infusions: . sodium chloride 75 mL/hr at 07/10/14 0447     Time spent: 15 minutes  [facility] Pager [phone]. If 7PM-7AM, please contact night-coverage at [email or web address], password [facility] 07/10/2014, 6:06 PM  LOS: 3 days

## 2014-07-10 NOTE — Evaluation (Deleted)
Physical Therapy Evaluation Patient Details Name: Michelle Farrell MRN: [id number] DOB: 1940-06-15 Today's Date: 07/11/2014   History of Present Illness     Clinical Impression  ,Pt admitted with/for intractable n/v/d, See  EGD results.  Pt currently limited functionally due to the problems listed below.  (see problems list.)  Pt will benefit from PT to maximize function and safety to be able to get home safely with available assist of family.     Follow Up Recommendations      Equipment Recommendations       Recommendations for Other Services       Precautions / Restrictions Restrictions Weight Bearing Restrictions: No      Mobility  Bed Mobility                  Transfers                    Ambulation/Gait                Stairs            Wheelchair Mobility    Modified Rankin (Stroke Patients Only)       Balance                                             Pertinent Vitals/Pain      Home Living                        Prior Function                 Hand Dominance        Extremity/Trunk Assessment                         Communication      Cognition                            General Comments      Exercises        Assessment/Plan    PT Assessment    PT Diagnosis     PT Problem List    PT Treatment Interventions     PT Goals (Current goals can be found in the Care Plan section)      Frequency     Barriers to discharge        Co-evaluation               End of Session                 Time:  -      Charges:         PT G Codes:        Kenedee Molesky, Tessie Fass 07/11/2014, 10:37 AM 07/11/2014  Donnella Sham, [place] [phone]  (pager)

## 2014-07-11 LAB — COMPREHENSIVE METABOLIC PANEL
ALK PHOS: 42 U/L (ref 39–117)
ALT: 23 U/L (ref 0–35)
AST: 17 U/L (ref 0–37)
Albumin: 2.4 g/dL — ABNORMAL LOW (ref 3.5–5.2)
Anion gap: 9 (ref 5–15)
BUN: 13 mg/dL (ref 6–23)
CHLORIDE: 110 mmol/L (ref 96–112)
CO2: 20 mmol/L (ref 19–32)
Calcium: 9.2 mg/dL (ref 8.4–10.5)
Creatinine, Ser: 2.2 mg/dL — ABNORMAL HIGH (ref 0.50–1.10)
GFR calc non Af Amer: 21 mL/min — ABNORMAL LOW (ref >90)
GFR, EST AFRICAN AMERICAN: 25 mL/min — AB (ref >90)
GLUCOSE: 151 mg/dL — AB (ref 70–99)
Potassium: 3.1 mmol/L — ABNORMAL LOW (ref 3.5–5.1)
Sodium: 139 mmol/L (ref 135–145)
TOTAL PROTEIN: 4.8 g/dL — AB (ref 6.0–8.3)
Total Bilirubin: 0.5 mg/dL (ref 0.3–1.2)

## 2014-07-11 LAB — CBC
HCT: 21.8 % — ABNORMAL LOW (ref 36.0–46.0)
HEMOGLOBIN: 7.6 g/dL — AB (ref 12.0–15.0)
MCH: 30 pg (ref 26.0–34.0)
MCHC: 34.9 g/dL (ref 30.0–36.0)
MCV: 86.2 fL (ref 78.0–100.0)
PLATELETS: 205 10*3/uL (ref 150–400)
RBC: 2.53 MIL/uL — ABNORMAL LOW (ref 3.87–5.11)
RDW: 15.7 % — AB (ref 11.5–15.5)
WBC: 9.7 10*3/uL (ref 4.0–10.5)

## 2014-07-11 LAB — GLUCOSE, CAPILLARY
Glucose-Capillary: 157 mg/dL — ABNORMAL HIGH (ref 70–99)
Glucose-Capillary: 133 mg/dL — ABNORMAL HIGH (ref 70–99)
Glucose-Capillary: 169 mg/dL — ABNORMAL HIGH (ref 70–99)
Glucose-Capillary: 180 mg/dL — ABNORMAL HIGH (ref 70–99)

## 2014-07-11 LAB — MAGNESIUM: Magnesium: 1.9 mg/dL (ref 1.5–2.5)

## 2014-07-11 LAB — BRAIN NATRIURETIC PEPTIDE: B Natriuretic Peptide: 82.7 pg/mL (ref 0.0–100.0)

## 2014-07-11 MED ORDER — POTASSIUM CHLORIDE CRYS ER 20 MEQ PO TBCR
40.0000 meq | EXTENDED_RELEASE_TABLET | Freq: Once | ORAL | Status: AC
  Administered 2014-07-11: 40 meq via ORAL
  Filled 2014-07-11: qty 2

## 2014-07-11 NOTE — Progress Notes (Signed)
PROGRESS NOTE  Mavi Un OMB:[id number] DOB: 12/18/1940 DOA: 07/07/2014 PCP: No primary care provider on file.  HPI/Recap of past 24 hours: The patient is a 74 y.o. 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 She had admissions in February and March of this year for gastroenteritis symptoms (n/v/d). She returned to the hospital on 06/19/14 with fever, altered mental status, and severe sepsis. There was concern for meningitis. She was found to have a UTI and a CVA. She was evaluated by neurology and infectious disease, stabilized and was transferred to [facility] on 06/27/2014 on vancomycin and ceftriaxone. Infectious Disease continued to follow her in CIR. On 4/8, she was found to have acute renal failure and a vancomycin trough level of 47. She was also on lisinopril and spironolactone. She was also noted to be bradycardic. She was started on IV fluids, her ACE inhibitor and spironolactone were discontinued, and she had a mild improvement in her kidney function. For bradycardia, her clonidine was discontinued and she was started on hydralazine. On 4/10 into 4/11, she developed epigastric pain with intractable nausea and vomiting. She had bilious and bloody emesis. She was readmitted for further evaluation. Labs were concerning for a positive gap metabolic acidosis, rising creatinine, decreasing hemoglobin. She developed a low-grade fever with tachycardia. Her blood pressure was elevated. For her vomiting, she underwent EGD on 4/12 which demonstrated esophagitis, gastritis, and duodenitis. Her temperature is starting to trend down with broader spectrum antibiotics.  Patient doing okay. No complaints  Assessment/Plan: Active Problems:   Crohn disease   Accelerated hypertension: On BiDil 3 times a day. Clonidine held secondary to bradycardia. Blood  pressures continue to remain stable    AKI (acute kidney injury) secondary to drug-induced the setting of dehydration plus ACE inhibitor. Following elevation of renal function yesterday, with IV fluids better. However, today no change from previous day and so likely this may be her new baseline. Have stopped IV fluids and will recheck tomorrow.  Pelvic mass:  ? cyst. For outpatient pelvic ultrasound    Diastolic CHF: Stable continue to monitor closely.   Metabolic acidosis: Resolved, likely the setting of uremia from renal failure    Sepsis/fever while on an about eczema setting of recent meningitis. Evaluated during previous admission by infectious disease. No obvious source. Suspect this could be vancomycin toxicity and tremor related fever. Neck minus discontinued from acute kidney injury and vancomycin level down to 20. On cefepime plus Flagyl. Lactic acid level within normal limits  Anemia: No evidence of acute blood loss, although overall trend downward. Follow hemoglobin and transfuse if below 7. This may be more secondary to renal disease.    Bacterial meningitis: We'll discuss with infectious disease about an antibiotic change   Bradycardia: Resolved. Secondary to clonidine which has been discontinued. TSH normal.    Esophagitis /gastritis/duodenitis causing intractable nausea/vomiting/hematemesis: Status post EGD. Continue permanent twice a day PPI. Currently on full liquids, advanced to solid. Hemoglobin remained stable   Code Status: Full  Family Communication:  spoke with daughter by phone yesterday Disposition: Possible short-term skilled nursing Monday  Consultants:  [facility]  Nephrology  Procedures:  None  Antibiotics:  IV Flagyl 4/12-present  IV Cefepime 4/12-present   Objective: BP 113/46 mmHg  Pulse 68  Temp(Src) 99.1 F (37.3 C) (Oral)  Resp 18  Ht 5' 6"  (1.676 m)  Wt 76.9 kg (169 lb 8.5 oz)  BMI 27.38 kg/m2  SpO2 100%  Intake/Output Summary  (Last 24 hours) at 07/11/14 1732 Last data filed at 07/11/14 1446  Gross per 24 hour  Intake    640 ml  Output    750 ml  Net   -110 ml   Filed Weights   07/09/14 0500 07/10/14 0436 07/11/14 0523  Weight: 75.524 kg (166 lb 8 oz) 76 kg (167 lb 8.8 oz) 76.9 kg (169 lb 8.5 oz)    Exam:   General:  Alert & oriented x2, NAD  Cardiovascular: RRR S1S2  Respiratory: CTA Bilaterally  Abdomen: Soft, NT, ND, +BS  Musculoskeletal: Tr edema  Data Reviewed: Basic Metabolic Panel:  Recent Labs Lab 07/07/14 1310 07/08/14 1029 07/09/14 0500 07/10/14 0946 07/11/14 0510  NA 141 142 140 138 139  K 4.1 4.0 3.8 3.5 3.1*  CL 109 109 106 107 110  CO2 19 21 23 22 20   GLUCOSE 201* 164* 158* 145* 151*  BUN 9 11 17 16 13   CREATININE 2.31* 2.21* 2.44* 2.21* 2.20*  CALCIUM 10.1 10.4 9.5 9.3 9.2  MG  --  1.9 1.9 1.9 1.9   Liver Function Tests:  Recent Labs Lab 07/07/14 1310 07/08/14 1029 07/09/14 0500 07/10/14 0946 07/11/14 0510  AST 23 46* 41* 23 17  ALT 16 43* 43* 31 23  ALKPHOS 52 55 41 45 42  BILITOT 0.7 0.7 0.3 0.5 0.5  PROT 6.6 6.2 5.0* 5.0* 4.8*  ALBUMIN 3.1* 3.1* 2.4* 2.5* 2.4*    Recent Labs Lab 07/07/14 1100  LIPASE 20   No results for input(s): AMMONIA in the last 168 hours. CBC:  Recent Labs Lab 07/07/14 1310 07/08/14 0615 07/09/14 0500 07/10/14 0946 07/11/14 0510  WBC 10.4 13.6* 11.5* 11.8* 9.7  NEUTROABS 8.9*  --   --   --   --   HGB 10.4* 10.5* 8.0* 8.2* 7.6*  HCT 30.3* 30.1* 23.6* 23.1* 21.8*  MCV 87.8 86.7 88.4 86.2 86.2  PLT 323 398 220 205 205   Cardiac Enzymes:   No results for input(s): CKTOTAL, CKMB, CKMBINDEX, TROPONINI in the last 168 hours. BNP (last 3 results)  Recent Labs  07/11/14 1000  BNP 82.7    ProBNP (last 3 results)  Recent Labs  08/27/13 2205 08/28/13 0454  PROBNP 263.1* 251.0*    CBG:  Recent Labs Lab 07/10/14 1749 07/10/14 2208 07/11/14 0808 07/11/14 1153 07/11/14 1630  GLUCAP 153* 180* 157* 133*  169*    Recent Results (from the past 240 hour(s))  Culture, blood (x 2)     Status: None (Preliminary result)   Collection Time: 07/07/14  6:30 PM  Result Value Ref Range Status   Specimen Description BLOOD LEFT ANTECUBITAL  Final   Special Requests BOTTLES DRAWN AEROBIC AND ANAEROBIC 5CC  Final   Culture   Final           BLOOD CULTURE RECEIVED NO GROWTH TO DATE CULTURE WILL BE HELD FOR 5 DAYS BEFORE ISSUING A FINAL NEGATIVE REPORT Performed at [facility]    Report Status PENDING  Incomplete  Culture, blood (x 2)     Status: None (Preliminary result)   Collection Time: 07/07/14  6:45 PM  Result Value Ref Range Status   Specimen Description BLOOD LEFT HAND  Final   Special Requests BOTTLES DRAWN AEROBIC AND ANAEROBIC 5CC  Final   Culture   Final           BLOOD CULTURE RECEIVED NO GROWTH TO DATE CULTURE  WILL BE HELD FOR 5 DAYS BEFORE ISSUING A FINAL NEGATIVE REPORT Performed at [facility]    Report Status PENDING  Incomplete     Studies: No results found.  Scheduled Meds: . ceFEPime (MAXIPIME) IV  1 g Intravenous Q24H  . feeding supplement (ENSURE ENLIVE)  237 mL Oral BID BM  . insulin aspart  0-5 Units Subcutaneous QHS  . insulin aspart  0-9 Units Subcutaneous TID WC  . isosorbide-hydrALAZINE  1 tablet Oral TID  . labetalol  100 mg Oral TID  . magnesium chloride  2 tablet Oral BID  . metronidazole  500 mg Intravenous Q8H  . pantoprazole  40 mg Oral BID AC  . sodium chloride  1,000 mL Intravenous Once  . sodium chloride  10-40 mL Intracatheter Q12H  . sodium chloride  3 mL Intravenous Q12H  . vitamin B-12  1,000 mcg Oral Daily    Continuous Infusions: . sodium chloride 75 mL/hr at 07/11/14 1240     Time spent: 15 minutes  [facility] Pager [phone]. If 7PM-7AM, please contact night-coverage at [email or web address], password [email or web address] 07/11/2014, 5:32 PM  LOS: 4 days

## 2014-07-11 NOTE — Clinical Social Work Note (Signed)
CSW spoke with MD. MD will discharge on Monday. CSW contact [facility] to provided clinical updates to [place] at [facility] Ivin Booty reported her DON need to review the pt clinicals again to determine if they will take the pt. The pt has 5 additional bed offer. CSW will continue to assist and follow the pt.   [place], MSW, [place]

## 2014-07-11 NOTE — Clinical Social Work Placement (Signed)
Clinical Social Work Department CLINICAL SOCIAL WORK PLACEMENT NOTE 07/10/2014  Patient:  Michelle Farrell, Michelle Farrell  Account Number:  [id number] [place] date:  07/07/2014  Clinical Social Worker:  Barbette Or, LCSW  Date/time:  07/10/2014 03:30 PM  Clinical Social Work is seeking post-discharge placement for this patient at the following level of care:   SKILLED NURSING   (*CSW will update this form in Epic as items are completed)   07/10/2014  Patient/family provided with [place] Department of Clinical Social Work's list of facilities offering this level of care within the geographic area requested by the patient (or if unable, by the patient's family).  07/10/2014  Patient/family informed of their freedom to choose among providers that offer the needed level of care, that participate in Medicare, Medicaid or managed care program needed by the patient, have an available bed and are willing to accept the patient.  07/10/2014  Patient/family informed of [facility]' ownership interest in [facility], as well as of the fact that they are under no obligation to receive care at this facility.  PASARR submitted to EDS on 07/10/2014 PASARR number received on   FL2 transmitted to all facilities in geographic area requested by pt/family on  07/10/2014 FL2 transmitted to all facilities within larger geographic area on   Patient informed that his/her managed care company has contracts with or will negotiate with  certain facilities, including the following:     Patient/family informed of bed offers received:   Patient chooses bed at  Physician recommends and patient chooses bed at    Patient to be transferred to  on   Patient to be transferred to facility by  Patient and family notified of transfer on  Name of family member notified:    The following physician request were entered in Epic:   Additional Comments:

## 2014-07-11 NOTE — Progress Notes (Addendum)
Late Addendum Note for evaluation 07/10/14   07/10/14 1037  PT Visit Information  Last PT Received On 07/10/14  Assistance Needed +1  History of Present Illness 74 y.o. female with a history of Crohn's disease, Diastolic CHF, HTN, IDDM, and Hyperlipidemia, COPD, neuropathy, chronic back pain admitted again for epigastric pain and intractable N/V/D.  Upper GI showed esophagitis, gastritis and duodenitis.  Work up continues.  Precautions  Precautions Fall  Precaution Comments pt not listening well to cues/commands given.  Home Living  Family/patient expects to be discharged to: Private residence  Living Arrangements Other relatives (pt states greatgranddaughter lives with her (school age))  Available Help at Discharge Family;Other (Comment) (daughter helps out, not sure how much.)  Type of Home Apartment (pt states 1 story house with no steps.)  Home Access Stairs to enter  Entrance Stairs-Number of Steps 3  Home Layout One level  [place] - single point;Walker - 2 wheels  Lives With Other (Comment) (with great granddaughter)  Prior Function  Level of Independence Independent  Comments has RW and cane but doesn't use per daughter  Communication  Communication Expressive difficulties;Receptive difficulties  Pain Assessment  Pain Assessment No/denies pain  Pain Score 0  Cognition  Arousal/Alertness Awake/alert  Behavior During Therapy Flat affect  Overall Cognitive Status No family/caregiver present to determine baseline cognitive functioning  Area of Impairment Safety/judgement;Following commands;Problem solving  Current Attention Level Sustained  Following Commands Follows one step commands inconsistently  Problem Solving Slow processing;Decreased initiation;Difficulty sequencing;Requires verbal cues;Requires tactile cues  Upper Extremity Assessment  Upper Extremity Assessment Defer to OT evaluation  Lower Extremity Assessment  Lower Extremity Assessment Overall WFL  for tasks assessed  Bed Mobility  Overal bed mobility Needs Assistance  Bed Mobility Supine to Sit;Sit to Supine  Supine to sit Min guard  Sit to supine Min guard  General bed mobility comments No physical assist needed but min guard for safety. Increased time required.   Transfers  Overall transfer level Needs assistance  Transfers Sit to/from Stand  Sit to Stand Min guard  General transfer comment cues for better safety.  Ambulation/Gait  Ambulation/Gait assistance Min guard  Ambulation Distance (Feet) 120 Feet  Assistive device (occasional use of rail)  Gait Pattern/deviations Step-through pattern;Decreased step length - right;Decreased step length - left;Decreased stride length (mildly staggery steps at times)  Gait velocity interpretation Below normal speed for age/gender  General Gait Details gait stiff and mildly staggery at times.  Worsening with fatigue, but get the feeling this was much further than she usually walked.  Modified Rankin (Stroke Patients Only)  Pre-Morbid Rankin Score 0  Modified Rankin 3  Balance  Overall balance assessment Needs assistance  Sitting-balance support No upper extremity supported  Sitting balance-Leahy Scale Good  Standing balance-Leahy Scale Fair  General Comments  General comments (skin integrity, edema, etc.) SpO2 during gait 97% on RA, EHR 98 bpm and some noticeable dyspnea.  PT - End of Session  Activity Tolerance Patient limited by fatigue  Patient left in bed;with call bell/phone within reach  Nurse Communication Mobility status  PT Assessment  PT Therapy Diagnosis  Difficulty walking  PT Recommendation/Assessment Patient needs continued PT services  PT Problem List Decreased strength;Decreased activity tolerance;Decreased balance;Decreased mobility;Decreased safety awareness;Cardiopulmonary status limiting activity  PT Plan  PT Frequency (ACUTE ONLY) Min 3X/week  PT Treatment/Interventions (ACUTE ONLY) Gait training;Stair  training;Functional mobility training;Therapeutic activities;Therapeutic exercise;Balance training;Patient/family education  PT Recommendation  Follow Up Recommendations Other (comment);SNF (HHPT if family refuses  SNF and can provide adequate assist.)  PT equipment None recommended by PT  Individuals Consulted  Consulted and Agree with Results and Recommendations Patient  Acute Rehab PT Goals  Patient Stated Goal go home  Time For Goal Achievement 07/24/14  Potential to Achieve Goals Fair  PT Time Calculation  PT Start Time (ACUTE ONLY) 1010  PT Stop Time (ACUTE ONLY) 1035  PT Time Calculation (min) (ACUTE ONLY) 25 min  PT General Charges  $$ ACUTE PT VISIT 1 Procedure  PT Evaluation  $Initial PT Evaluation Tier I 1 Procedure  PT Treatments  $Gait Training 8-22 mins  Written Expression  Dominant Hand Right  07/11/2014  Donnella Sham, PT [phone]  (pager)

## 2014-07-11 NOTE — Clinical Social Work Note (Addendum)
Clinical Social Work Department BRIEF PSYCHOSOCIAL ASSESSMENT 07/10/2014  Patient:  Michelle Farrell, Michelle Farrell     Account Number:  [id number]     Admit date:  07/07/2014  Clinical Social Worker:  Myles Lipps  Date/Time:  07/10/2014 03:30 PM  Referred by:  Care Management  Date Referred:  07/10/2014 Referred for  SNF Placement   Other Referral:   Interview type:  Family Other interview type:   Spoke with patient granddaughter [name] over the phone    PSYCHOSOCIAL DATA Living Status:  FAMILY Admitted from facility:   Level of care:   Primary support name:  Andrey Spearman  [phone] Primary support relationship to patient:  FAMILY Degree of support available:   Strong and supportive    CURRENT CONCERNS Current Concerns  Post-Acute Placement   Other Concerns:    SOCIAL WORK ASSESSMENT / PLAN Clinical Social Worker spoke with patient granddaughter over the phone to offer support and discuss patient plans at discharge.  Patient granddaughter states that she spoke with CM and MD regarding patient plan to discharge home, however was not aware at the time that patient discharge would be so soon.  Patient granddaughter feels that patient could benefit from a short SNF stay prior to return home with her and her family.  CSW initiated SNF search in [facility] with preference to [place] who are both reviewing information.  Patient granddaughter plans to talk with other family and friends regarding the possibility of other options.  CSW to provide patient granddaughter with available bed offers.  CSW remains available for support and to facilitate patient discharge needs once medically stable.   Assessment/plan status:  Psychosocial Support/Ongoing Assessment of Needs Other assessment/ plan:   Information/referral to community resources:   [facility] representative offered patient granddaughter facility list to be available in patient room or through email, however patient  granddaughter states that she would rather have a list with available bed offers provided.    Patient granddaughter has already completed long term Medicaid application and will provide Medicaid worker with facility name once available.    PATIENT'S/FAMILY'S RESPONSE TO PLAN OF CARE: Patient alert and oriented x3, however per RN patient deferred discharge planning conversation to patient granddaughter.  CSW to follow up with patient and patient granddaughter to further discuss and present discharge plan.  Patient granddaughter agreeable with plan and hopeful to have patient back in the home shortly.  Patient and patient granddaughter with a healthy relationship that offers support and guidance for long term plans.  Patient granddaughter verbalized understanding of CSW role and appreciation for support and concern.

## 2014-07-11 NOTE — Progress Notes (Signed)
Physical Therapy Treatment Patient Details Name: Michelle Farrell MRN: [id number] DOB: March 09, 1941 Today's Date: 07/11/2014    History of Present Illness 74 y.o. female with a history of Crohn's disease, Diastolic CHF, HTN, IDDM, and Hyperlipidemia, COPD, neuropathy, chronic back pain admitted again for epigastric pain and intractable N/V/D.  Upper GI showed esophagitis, gastritis and duodenitis.  Work up continues.    PT Comments    Mildly improved mentation.  Better gait stability, still limited by fatigue.  Follow Up Recommendations  SNF;Other (comment) (HHPT if pt/family refuse SNF and have adequate assist)     Equipment Recommendations  None recommended by PT    Recommendations for Other Services       Precautions / Restrictions Precautions Precautions: Fall Restrictions Weight Bearing Restrictions: No    Mobility  Bed Mobility Overal bed mobility: Needs Assistance Bed Mobility: Supine to Sit;Sit to Supine     Supine to sit: Min assist Sit to supine: Supervision   General bed mobility comments: Pt struggled mildly getting OOB and needed minimal assist  Transfers Overall transfer level: Needs assistance   Transfers: Sit to/from Stand Sit to Stand: Min guard         General transfer comment: cues for better safety.  Ambulation/Gait Ambulation/Gait assistance: Min guard;Min assist Ambulation Distance (Feet): 200 Feet Assistive device: Rolling walker (2 wheeled) Gait Pattern/deviations: Step-through pattern     General Gait Details: gait initially stiff, but smoothed out as she progressed.  Then with noticeable fatigue, but no overt safety or stability issues.   Stairs            Wheelchair Mobility    Modified Rankin (Stroke Patients Only) Modified Rankin (Stroke Patients Only) Pre-Morbid Rankin Score: No symptoms Modified Rankin: Moderate disability     Balance Overall balance assessment: No apparent balance deficits (not formally  assessed) Sitting-balance support: No upper extremity supported Sitting balance-Leahy Scale: Good       Standing balance-Leahy Scale: Fair Standing balance comment: based on washing hands at this sink                    Cognition Arousal/Alertness: Awake/alert Behavior During Therapy: WFL for tasks assessed/performed Overall Cognitive Status: Within Functional Limits for tasks assessed     Current Attention Level: Selective   Following Commands: Follows one step commands with increased time            Exercises      General Comments        Pertinent Vitals/Pain Pain Assessment: No/denies pain    Home Living                      Prior Function            PT Goals (current goals can now be found in the care plan section) Acute Rehab PT Goals Patient Stated Goal: go home PT Goal Formulation: Patient unable to participate in goal setting Time For Goal Achievement: 07/24/14 Potential to Achieve Goals: Fair Progress towards PT goals: Progressing toward goals    Frequency  Min 3X/week    PT Plan Current plan remains appropriate    Co-evaluation             End of Session   Activity Tolerance: Patient tolerated treatment well;Patient limited by fatigue Patient left: in bed;with call bell/phone within reach     Time: 2863-8177 PT Time Calculation (min) (ACUTE ONLY): 20 min  Charges:  $Gait Training: 8-22 mins  G Codes:      Charna Neeb, Tessie Fass 07/11/2014, 11:26 AM 07/11/2014  Donnella Sham, [place] [phone]  (pager)

## 2014-07-12 LAB — COMPREHENSIVE METABOLIC PANEL
ALBUMIN: 2.3 g/dL — AB (ref 3.5–5.2)
ALT: 17 U/L (ref 0–35)
ANION GAP: 11 (ref 5–15)
AST: 15 U/L (ref 0–37)
Alkaline Phosphatase: 40 U/L (ref 39–117)
BUN: 12 mg/dL (ref 6–23)
CALCIUM: 9.3 mg/dL (ref 8.4–10.5)
CHLORIDE: 110 mmol/L (ref 96–112)
CO2: 17 mmol/L — ABNORMAL LOW (ref 19–32)
Creatinine, Ser: 2.28 mg/dL — ABNORMAL HIGH (ref 0.50–1.10)
GFR calc Af Amer: 24 mL/min — ABNORMAL LOW (ref >90)
GFR, EST NON AFRICAN AMERICAN: 20 mL/min — AB (ref >90)
Glucose, Bld: 135 mg/dL — ABNORMAL HIGH (ref 70–99)
POTASSIUM: 3.7 mmol/L (ref 3.5–5.1)
Sodium: 138 mmol/L (ref 135–145)
Total Bilirubin: 0.4 mg/dL (ref 0.3–1.2)
Total Protein: 4.8 g/dL — ABNORMAL LOW (ref 6.0–8.3)

## 2014-07-12 LAB — CBC
HEMATOCRIT: 21.6 % — AB (ref 36.0–46.0)
Hemoglobin: 7.4 g/dL — ABNORMAL LOW (ref 12.0–15.0)
MCH: 30.1 pg (ref 26.0–34.0)
MCHC: 34.3 g/dL (ref 30.0–36.0)
MCV: 87.8 fL (ref 78.0–100.0)
Platelets: 191 10*3/uL (ref 150–400)
RBC: 2.46 MIL/uL — ABNORMAL LOW (ref 3.87–5.11)
RDW: 16 % — AB (ref 11.5–15.5)
WBC: 10.9 10*3/uL — ABNORMAL HIGH (ref 4.0–10.5)

## 2014-07-12 LAB — MAGNESIUM: MAGNESIUM: 1.9 mg/dL (ref 1.5–2.5)

## 2014-07-12 LAB — GLUCOSE, CAPILLARY
Glucose-Capillary: 127 mg/dL — ABNORMAL HIGH (ref 70–99)
Glucose-Capillary: 269 mg/dL — ABNORMAL HIGH (ref 70–99)
Glucose-Capillary: 129 mg/dL — ABNORMAL HIGH (ref 70–99)

## 2014-07-12 MED ORDER — METRONIDAZOLE 500 MG PO TABS
500.0000 mg | ORAL_TABLET | Freq: Three times a day (TID) | ORAL | Status: DC
  Administered 2014-07-12 – 2014-07-13 (×3): 500 mg via ORAL
  Filled 2014-07-12 (×5): qty 1

## 2014-07-12 MED ORDER — OXYCODONE HCL 5 MG PO TABS: 5.0000 mg | ORAL_TABLET | Freq: Four times a day (QID) | ORAL | Status: DC | PRN

## 2014-07-12 MED ORDER — ACETAMINOPHEN 325 MG PO TABS
650.0000 mg | ORAL_TABLET | Freq: Four times a day (QID) | ORAL | Status: DC | PRN
  Administered 2014-07-12: 650 mg via ORAL
  Filled 2014-07-12: qty 2

## 2014-07-12 NOTE — Progress Notes (Signed)
PROGRESS NOTE  Fritzi Scripter JKD:[id number] DOB: 1941-03-03 DOA: 07/07/2014 PCP: No primary care provider on file.  HPI/Recap of past 24 hours: The patient is a 74 y.o. 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 She had admissions in February and March of this year for gastroenteritis symptoms (n/v/d). She returned to the hospital on 06/19/14 with fever, altered mental status, and severe sepsis. There was concern for meningitis. She was found to have a UTI and a CVA. She was evaluated by neurology and infectious disease, stabilized and was transferred to [place] on 06/27/2014 on vancomycin and ceftriaxone. Infectious Disease continued to follow her in CIR. On 4/8, she was found to have acute renal failure and a vancomycin trough level of 47. She was also on lisinopril and spironolactone. She was also noted to be bradycardic. She was started on IV fluids, her ACE inhibitor and spironolactone were discontinued, and she had a mild improvement in her kidney function. For bradycardia, her clonidine was discontinued and she was started on hydralazine. On 4/10 into 4/11, she developed epigastric pain with intractable nausea and vomiting. She had bilious and bloody emesis. She was readmitted for further evaluation. Labs were concerning for a positive gap metabolic acidosis, rising creatinine, decreasing hemoglobin. She developed a low-grade fever with tachycardia. Her blood pressure was elevated. For her vomiting, she underwent EGD on 4/12 which demonstrated esophagitis, gastritis, and duodenitis. Her temperature is starting to trend down with broader spectrum antibiotics.  Past few days, patient's creatinine has stabilized around 2.2 with and without IV fluids. She's had no further fevers. Her hemoglobin has been trending down slowly and currently around 7.5  with no signs of acute blood loss. Patient with no complaints initially for me, but nursing later reported she was complaining of some generalized abdominal pain  Assessment/Plan: Active Problems:   Crohn disease   Accelerated hypertension: On BiDil 3 times a day. Clonidine held secondary to bradycardia. Blood pressures continue to remain stable    AKI (acute kidney injury) secondary to drug-induced the setting of dehydration plus ACE inhibitor. Following elevation of renal function yesterday, with IV fluids better. Staying stable around 2.2  Pelvic mass:  ? cyst. For outpatient pelvic ultrasound    Diastolic CHF: Stable continue to monitor closely.   Metabolic acidosis: Resolved, likely the setting of uremia from renal failure    Sepsis/fever while on an about eczema setting of recent meningitis. Evaluated during previous admission by infectious disease. No obvious source. Suspect this could be vancomycin toxicity and tremor related fever. Neck minus discontinued from acute kidney injury and vancomycin level down to 20. On cefepime plus Flagyl. Lactic acid level within normal limits  Anemia: No evidence of acute blood loss, although overall trend downward. Follow hemoglobin and transfuse if below 7. This may be more secondary to renal disease. We'll discuss with nephrology about any erythropoietin    Bacterial meningitis: Discussing with infectious disease about when to discontinue antibiotics    Bradycardia: Resolved. Secondary to clonidine which has been discontinued. TSH normal.    Esophagitis /gastritis/duodenitis causing intractable nausea/vomiting/hematemesis: Status post EGD. Continue permanent twice a day PPI. Currently on full liquids, advanced to solid.   Code Status: Full  Family CommunicatioSpoke with granddaughter by phone today Disposition: Possible short-term skilled nursing Monday  Consultants:  [facility]  Procedures:  None  Antibiotics:  IV  Flagyl 4/12-present  IV  Cefepime 4/12-present   Objective: BP 120/48 mmHg  Pulse 68  Temp(Src) 98.3 F (36.8 C) (Oral)  Resp 18  Ht 5' 6"  (1.676 m)  Wt 77 kg (169 lb 12.1 oz)  BMI 27.41 kg/m2  SpO2 98%  Intake/Output Summary (Last 24 hours) at 07/12/14 1553 Last data filed at 07/12/14 0918  Gross per 24 hour  Intake   1040 ml  Output    550 ml  Net    490 ml   Filed Weights   07/10/14 0436 07/11/14 0523 07/12/14 0528  Weight: 76 kg (167 lb 8.8 oz) 76.9 kg (169 lb 8.5 oz) 77 kg (169 lb 12.1 oz)    Exam:   General:  Alert & oriented x2Resting comfortably  Cardiovascular: RRR S1S2  Respiratory: CTA Bilaterally  Abdomen: Soft, NT, ND, +BS Musculoskeletal: no pitting edema   Basic Metabolic Panel:  Recent Labs Lab 07/08/14 1029 07/09/14 0500 07/10/14 0946 07/11/14 0510 07/12/14 0550  NA 142 140 138 139 138  K 4.0 3.8 3.5 3.1* 3.7  CL 109 106 107 110 110  CO2 21 23 22 20  17*  GLUCOSE 164* 158* 145* 151* 135*  BUN 11 17 16 13 12   CREATININE 2.21* 2.44* 2.21* 2.20* 2.28*  CALCIUM 10.4 9.5 9.3 9.2 9.3  MG 1.9 1.9 1.9 1.9 1.9   Liver Function Tests:  Recent Labs Lab 07/08/14 1029 07/09/14 0500 07/10/14 0946 07/11/14 0510 07/12/14 0550  AST 46* 41* 23 17 15   ALT 43* 43* 31 23 17   ALKPHOS 55 41 45 42 40  BILITOT 0.7 0.3 0.5 0.5 0.4  PROT 6.2 5.0* 5.0* 4.8* 4.8*  ALBUMIN 3.1* 2.4* 2.5* 2.4* 2.3*    Recent Labs Lab 07/07/14 1100  LIPASE 20   No results for input(s): AMMONIA in the last 168 hours. CBC:  Recent Labs Lab 07/07/14 1310 07/08/14 0615 07/09/14 0500 07/10/14 0946 07/11/14 0510 07/12/14 0550  WBC 10.4 13.6* 11.5* 11.8* 9.7 10.9*  NEUTROABS 8.9*  --   --   --   --   --   HGB 10.4* 10.5* 8.0* 8.2* 7.6* 7.4*  HCT 30.3* 30.1* 23.6* 23.1* 21.8* 21.6*  MCV 87.8 86.7 88.4 86.2 86.2 87.8  PLT 323 398 220 205 205 191   Cardiac Enzymes:   No results for input(s): CKTOTAL, CKMB, CKMBINDEX, TROPONINI in the last 168 hours. BNP (last  3 results)  Recent Labs  07/11/14 1000  BNP 82.7    ProBNP (last 3 results)  Recent Labs  08/27/13 2205 08/28/13 0454  PROBNP 263.1* 251.0*    CBG:  Recent Labs Lab 07/11/14 1153 07/11/14 1630 07/11/14 2200 07/12/14 0801 07/12/14 1212  GLUCAP 133* 169* 180* 127* 269*    Recent Results (from the past 240 hour(s))  Culture, blood (x 2)     Status: None (Preliminary result)   Collection Time: 07/07/14  6:30 PM  Result Value Ref Range Status   Specimen Description BLOOD LEFT ANTECUBITAL  Final   Special Requests BOTTLES DRAWN AEROBIC AND ANAEROBIC 5CC  Final   Culture   Final           BLOOD CULTURE RECEIVED NO GROWTH TO DATE CULTURE WILL BE HELD FOR 5 DAYS BEFORE ISSUING A FINAL NEGATIVE REPORT Performed at [facility]    Report Status PENDING  Incomplete  Culture, blood (x 2)     Status: None (Preliminary result)   Collection Time: 07/07/14  6:45 PM  Result Value Ref Range Status   Specimen  Description BLOOD LEFT HAND  Final   Special Requests BOTTLES DRAWN AEROBIC AND ANAEROBIC 5CC  Final   Culture   Final           BLOOD CULTURE RECEIVED NO GROWTH TO DATE CULTURE WILL BE HELD FOR 5 DAYS BEFORE ISSUING A FINAL NEGATIVE REPORT Performed at [facility]    Report Status PENDING  Incomplete     Studies: No results found.  Scheduled Meds: . ceFEPime (MAXIPIME) IV  1 g Intravenous Q24H  . feeding supplement (ENSURE ENLIVE)  237 mL Oral BID BM  . insulin aspart  0-5 Units Subcutaneous QHS  . insulin aspart  0-9 Units Subcutaneous TID WC  . isosorbide-hydrALAZINE  1 tablet Oral TID  . labetalol  100 mg Oral TID  . magnesium chloride  2 tablet Oral BID  . metronidazole  500 mg Intravenous Q8H  . pantoprazole  40 mg Oral BID AC  . sodium chloride  1,000 mL Intravenous Once  . sodium chloride  10-40 mL Intracatheter Q12H  . sodium chloride  3 mL Intravenous Q12H  . vitamin B-12  1,000 mcg Oral Daily    Continuous Infusions:     Time  spent: 15 minutes  [facility] Pager [phone]. If 7PM-7AM, please contact night-coverage at [email or web address], password [facility] 07/12/2014, 3:53 PM  LOS: 5 days

## 2014-07-12 NOTE — Social Work (Signed)
CSW provided family with bed offers and followed up with patient's grand daughter about current offers. Patient's grand daughter stated that she did not like any of the facilities that offered a bed. Patient's GD asked about [facility], [facility] and [facility]. Those facilites did not make offers but [facility] and [facility] are considering patient. CSW followed up with facilities in order to assess potential for a bed offer. This CSW sent additional documentation for clarity on 'cyst'. CSW will follow up with both facilities either Sunday or Monday.  Christene Lye MSW, [place]

## 2014-07-13 DIAGNOSIS — N189 Chronic kidney disease, unspecified: Secondary | ICD-10-CM

## 2014-07-13 DIAGNOSIS — D649 Anemia, unspecified: Secondary | ICD-10-CM | POA: Diagnosis present

## 2014-07-13 DIAGNOSIS — D631 Anemia in chronic kidney disease: Secondary | ICD-10-CM | POA: Diagnosis present

## 2014-07-13 LAB — BRAIN NATRIURETIC PEPTIDE: B NATRIURETIC PEPTIDE 5: 125.4 pg/mL — AB (ref 0.0–100.0)

## 2014-07-13 LAB — GLUCOSE, CAPILLARY
GLUCOSE-CAPILLARY: 170 mg/dL — AB (ref 70–99)
Glucose-Capillary: 148 mg/dL — ABNORMAL HIGH (ref 70–99)
Glucose-Capillary: 129 mg/dL — ABNORMAL HIGH (ref 70–99)
Glucose-Capillary: 210 mg/dL — ABNORMAL HIGH (ref 70–99)
Glucose-Capillary: 173 mg/dL — ABNORMAL HIGH (ref 70–99)

## 2014-07-13 LAB — MAGNESIUM: Magnesium: 2 mg/dL (ref 1.5–2.5)

## 2014-07-13 LAB — COMPREHENSIVE METABOLIC PANEL
ALBUMIN: 2.3 g/dL — AB (ref 3.5–5.2)
ALT: 16 U/L (ref 0–35)
ANION GAP: 9 (ref 5–15)
AST: 17 U/L (ref 0–37)
Alkaline Phosphatase: 42 U/L (ref 39–117)
BUN: 10 mg/dL (ref 6–23)
CALCIUM: 9.8 mg/dL (ref 8.4–10.5)
CO2: 17 mmol/L — ABNORMAL LOW (ref 19–32)
Chloride: 112 mmol/L (ref 96–112)
Creatinine, Ser: 2.16 mg/dL — ABNORMAL HIGH (ref 0.50–1.10)
GFR calc non Af Amer: 22 mL/min — ABNORMAL LOW (ref >90)
GFR, EST AFRICAN AMERICAN: 25 mL/min — AB (ref >90)
Glucose, Bld: 141 mg/dL — ABNORMAL HIGH (ref 70–99)
Potassium: 3.8 mmol/L (ref 3.5–5.1)
SODIUM: 138 mmol/L (ref 135–145)
TOTAL PROTEIN: 5.1 g/dL — AB (ref 6.0–8.3)
Total Bilirubin: 0.4 mg/dL (ref 0.3–1.2)

## 2014-07-13 LAB — CBC
HEMATOCRIT: 22.7 % — AB (ref 36.0–46.0)
Hemoglobin: 7.9 g/dL — ABNORMAL LOW (ref 12.0–15.0)
MCH: 29.9 pg (ref 26.0–34.0)
MCHC: 34.8 g/dL (ref 30.0–36.0)
MCV: 86 fL (ref 78.0–100.0)
Platelets: 234 10*3/uL (ref 150–400)
RBC: 2.64 MIL/uL — ABNORMAL LOW (ref 3.87–5.11)
RDW: 15.8 % — AB (ref 11.5–15.5)
WBC: 10.6 10*3/uL — ABNORMAL HIGH (ref 4.0–10.5)

## 2014-07-13 NOTE — Social Work (Signed)
[facility] declined bed offer due to insurance. Still waiting to hear from [facility]. Family aware.  Christene Lye MSW, [place]

## 2014-07-13 NOTE — Progress Notes (Signed)
ANTIBIOTIC CONSULT NOTE  Pharmacy Consult for cefepime Indication: rule out sepsis  No Known Allergies  Patient Measurements: Height: 5' 6"  (167.6 cm) Weight: 170 lb 3.1 oz (77.2 kg) IBW/kg (Calculated) : 59.3   Vital Signs: Temp: 98.4 F (36.9 C) (04/17 0543) Temp Source: Oral (04/17 0543) BP: 168/64 mmHg (04/17 1053) Pulse Rate: 80 (04/17 1053) Intake/Output from previous day: 04/16 0701 - 04/17 0700 In: 460 [P.O.:300; I.V.:10; IV Piggyback:150] Out: 0  Intake/Output from this shift:    Labs:  Recent Labs  07/11/14 0510 07/12/14 0550 07/13/14 0408  WBC 9.7 10.9* 10.6*  HGB 7.6* 7.4* 7.9*  PLT 205 191 234  CREATININE 2.20* 2.28* 2.16*   Estimated Creatinine Clearance: 24.7 mL/min (by C-G formula based on Cr of 2.16). No results for input(s): VANCOTROUGH, VANCOPEAK, VANCORANDOM, GENTTROUGH, GENTPEAK, GENTRANDOM, TOBRATROUGH, TOBRAPEAK, TOBRARND, AMIKACINPEAK, AMIKACINTROU, AMIKACIN in the last 72 hours.   Microbiology: Recent Results (from the past 720 hour(s))  Blood Culture (routine x 2)     Status: None   Collection Time: 06/19/14 12:23 PM  Result Value Ref Range Status   Specimen Description BLOOD LEFT ANTECUBITAL  Final   Special Requests BOTTLES DRAWN AEROBIC AND ANAEROBIC 3CC  Final   Culture   Final    NO GROWTH 5 DAYS Performed at [facility]    Report Status 06/25/2014 FINAL  Final  Blood Culture (routine x 2)     Status: None   Collection Time: 06/19/14 12:36 PM  Result Value Ref Range Status   Specimen Description BLOOD LEFT ANTECUBITAL  Final   Special Requests BOTTLES DRAWN AEROBIC AND ANAEROBIC 5ML  Final   Culture   Final    NO GROWTH 5 DAYS Performed at [facility]    Report Status 06/25/2014 FINAL  Final  Urine culture     Status: None   Collection Time: 06/19/14  2:32 PM  Result Value Ref Range Status   Specimen Description URINE, CLEAN CATCH  Final   Special Requests NONE  Final   Colony Count   Final    80,000  COLONIES/ML Performed at [facility]    Culture   Final    ESCHERICHIA COLI Performed at [facility]    Report Status 06/21/2014 FINAL  Final   Organism ID, Bacteria ESCHERICHIA COLI  Final      Susceptibility   Escherichia coli - MIC*    AMPICILLIN >=32 RESISTANT Resistant     CEFAZOLIN >=64 RESISTANT Resistant     CEFTRIAXONE <=1 SENSITIVE Sensitive     CIPROFLOXACIN >=4 RESISTANT Resistant     GENTAMICIN <=1 SENSITIVE Sensitive     LEVOFLOXACIN >=8 RESISTANT Resistant     NITROFURANTOIN <=16 SENSITIVE Sensitive     TOBRAMYCIN <=1 SENSITIVE Sensitive     TRIMETH/SULFA >=320 RESISTANT Resistant     PIP/TAZO >=128 RESISTANT Resistant     * ESCHERICHIA COLI  MRSA PCR Screening     Status: None   Collection Time: 06/19/14  8:30 PM  Result Value Ref Range Status   MRSA by PCR NEGATIVE NEGATIVE Final    Comment:        The GeneXpert MRSA Assay (FDA approved for NASAL specimens only), is one component of a comprehensive MRSA colonization surveillance program. It is not intended to diagnose MRSA infection nor to guide or monitor treatment for MRSA infections.   CSF culture     Status: None   Collection Time: 06/20/14 11:33 AM  Result Value Ref  Range Status   Specimen Description CSF  Final   Special Requests NONE  Final   Gram Stain   Final    FEW WBC PRESENT, PREDOMINANTLY PMN NO ORGANISMS SEEN Gram Stain Report Called to,Read Back By and Verified With: Gram Stain Report Called to,Read Back By and Verified With: ALLISON DUNDON [id number]@1 :00AM BJENN Performed at [facility]    Culture   Final    NO GROWTH 3 DAYS Performed at [facility]    Report Status 06/24/2014 FINAL  Final  Gram stain     Status: None   Collection Time: 06/20/14 11:33 AM  Result Value Ref Range Status   Specimen Description CSF  Final   Special Requests NONE  Final   Gram Stain   Final    CYTOSPIN PREP WBC PRESENT, PREDOMINANTLY PMN NO ORGANISMS SEEN     Report Status 06/20/2014 FINAL  Final  Culture, blood (routine x 2)     Status: None   Collection Time: 06/24/14  6:44 PM  Result Value Ref Range Status   Specimen Description BLOOD LEFT HAND  Final   Special Requests BOTTLES DRAWN AEROBIC ONLY 3CC  Final   Culture   Final    NO GROWTH 5 DAYS Performed at [facility]    Report Status 07/01/2014 FINAL  Final  Culture, blood (routine x 2)     Status: None   Collection Time: 06/24/14  6:53 PM  Result Value Ref Range Status   Specimen Description BLOOD LEFT HAND  Final   Special Requests BOTTLES DRAWN AEROBIC AND ANAEROBIC 5CC  Final   Culture   Final    NO GROWTH 5 DAYS Performed at [facility]    Report Status 07/01/2014 FINAL  Final  Culture, Urine     Status: None   Collection Time: 06/24/14  7:50 PM  Result Value Ref Range Status   Specimen Description URINE, RANDOM  Final   Special Requests Normal  Final   Colony Count NO GROWTH Performed at [facility]   Final   Culture NO GROWTH Performed at [facility]   Final   Report Status 06/26/2014 FINAL  Final  Clostridium Difficile by PCR     Status: None   Collection Time: 06/26/14  8:19 PM  Result Value Ref Range Status   C difficile by pcr NEGATIVE NEGATIVE Final  Culture, blood (x 2)     Status: None (Preliminary result)   Collection Time: 07/07/14  6:30 PM  Result Value Ref Range Status   Specimen Description BLOOD LEFT ANTECUBITAL  Final   Special Requests BOTTLES DRAWN AEROBIC AND ANAEROBIC 5CC  Final   Culture   Final           BLOOD CULTURE RECEIVED NO GROWTH TO DATE CULTURE WILL BE HELD FOR 5 DAYS BEFORE ISSUING A FINAL NEGATIVE REPORT Performed at [facility]    Report Status PENDING  Incomplete  Culture, blood (x 2)     Status: None (Preliminary result)   Collection Time: 07/07/14  6:45 PM  Result Value Ref Range Status   Specimen Description BLOOD LEFT HAND  Final   Special Requests BOTTLES DRAWN AEROBIC AND  ANAEROBIC 5CC  Final   Culture   Final           BLOOD CULTURE RECEIVED NO GROWTH TO DATE CULTURE WILL BE HELD FOR 5 DAYS BEFORE ISSUING A FINAL NEGATIVE REPORT Performed at [facility]  Report Status PENDING  Incomplete    Assessment: 71 YOF transferred back from [facility] after being there from 4/1-4/11 for rehab following CVA. She is readmitted with increased lethargy, N/V, and concern for sepsis and GIB.  She completed a 14 day course of CTX/vancomycin for concern of meningitis. She developed AKI and had an elevated vancomycin trough. Last dose of vancomycin given on 4/9, however due to AKI, trough remained elevated until 4/11, meaning she still had active drug in her system.  CTX 3/24>>3/27; 3/28>>4/11 Vanc 3/25>>4/11 (last dose given 4/9) Flagyl 4/11>> Cefepime 4/11>>  SCr down to 2.16 today, est CrCl ~20-61m/min- suspect this is her new baseline. WBC 10.6, she remains afebrile. Lactic acid normal. Blood cultures sent on 4/11, no growth to date.  Goal of Therapy:  proper dosing based on hepatic and renal function  Plan:  -cefepime 1g IV q24h -Flagyl 5031mIV q8h per MD (proper dosing) -follow c/s, clinical progression, renal function, LOT  Terryl Molinelli D. Valora Norell, PharmD, BCPS Clinical Pharmacist Pager: [phone]/17/2016 12:04 PM

## 2014-07-13 NOTE — Progress Notes (Signed)
PROGRESS NOTE  Michelle Farrell [facility]:[id number] DOB: [birth year] DOA: 07/07/2014 PCP: No primary care provider on file.  HPI/Recap of past 24 hours: The patient is a 74 y.o. 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 She had admissions in February and March of this year for gastroenteritis symptoms (n/v/d). She returned to the hospital on 06/19/14 with fever, altered mental status, and severe sepsis. There was concern for meningitis. She was found to have a UTI and a CVA. She was evaluated by neurology and infectious disease, stabilized and was transferred to [place] on 06/27/2014 on vancomycin and ceftriaxone. Infectious Disease continued to follow her in [facility]. On 4/8, she was found to have acute renal failure and a vancomycin trough level of 47. She was also on lisinopril and spironolactone. She was also noted to be bradycardic. She was started on IV fluids, her ACE inhibitor and spironolactone were discontinued, and she had a mild improvement in her kidney function. For bradycardia, her clonidine was discontinued and she was started on hydralazine. On 4/10 into 4/11, she developed epigastric pain with intractable nausea and vomiting. She had bilious and bloody emesis. She was readmitted for further evaluation. Labs were concerning for a positive gap metabolic acidosis, rising creatinine, decreasing hemoglobin. She developed a low-grade fever with tachycardia. Her blood pressure was elevated. For her vomiting, she underwent EGD on 4/12 which demonstrated esophagitis, gastritis, and duodenitis. Her temperature is starting to trend down with broader spectrum antibiotics.  Past few days, patient's creatinine has stabilized around 2.2 with and without IV fluids. She's had no further fevers. Her hemoglobin has been trending down slowly and currently around 7.5-8  with no signs of acute blood loss.   Patient doing okay today. No complaints  Assessment/Plan: Active Problems:   Crohn disease   Accelerated hypertension: On BiDil 3 times a day. Clonidine held secondary to bradycardia. Blood pressures continue to remain stable    AKI (acute kidney injury) secondary to drug-induced the setting of dehydration plus ACE inhibitor. Following elevation of renal function yesterday, with IV fluids better. Staying stable around 2.2  Pelvic mass:  ? cyst. For outpatient pelvic ultrasound    Diastolic CHF: Stable continue to monitor closely.check follow-up BNP to ensure no volume overload    Metabolic acidosis: Resolved, likely the setting of uremia from renal failure    Sepsis/fever while on an about eczema setting of recent meningitis. Evaluated during previous admission by infectious disease. No obvious source. Suspect this could be vancomycin toxicity and tremor related fever. Neck minus discontinued from acute kidney injury and vancomycin level down to 20. On cefepime plus Flagyl, discussed with infectious disease and will stop now after 6 days. Lactic acid level within normal limits  Anemia: No evidence of acute blood loss, although overall trend downward. Follow hemoglobin and transfuse if below 7. This may be more secondary to renal disease. Hemoglobin trending up today.    Bacterial meningitis: Discussing with infectious disease about when to discontinue antibiotics    Bradycardia: Resolved. Secondary to clonidine which has been discontinued. TSH normal.    Esophagitis /gastritis/duodenitis causing intractable nausea/vomiting/hematemesis: Status post EGD. Continue permanent twice a day PPI. Tolerating solid food  Code Status: Full  Family Communication: spoke with granddaughter, yesterday Disposition: skilled nursing Monday  Consultants:  [facility]  Nephrology  Procedures:  None  Antibiotics:  IV Flagyl 4/12-4/16  IV Cefepime  4/12-4/16  Objective: BP 124/47 mmHg  Pulse 80  Temp(Src) 98.5 F (36.9 C) (Oral)  Resp 18  Ht 5' 6"  (1.676 m)  Wt 77.2 kg (170 lb 3.1 oz)  BMI 27.48 kg/m2  SpO2 99%  Intake/Output Summary (Last 24 hours) at 07/13/14 1605 Last data filed at 07/13/14 0255  Gross per 24 hour  Intake    300 ml  Output      0 ml  Net    300 ml   Filed Weights   07/11/14 0523 07/12/14 0528 07/13/14 0543  Weight: 76.9 kg (169 lb 8.5 oz) 77 kg (169 lb 12.1 oz) 77.2 kg (170 lb 3.1 oz)    Exam:   General:  Alert & oriented x2Resting comfortably  Cardiovascular: RRR S1S2  Respiratory: CTA Bilaterally  Abdomen: Soft, NT, ND, +BS Musculoskeletal: no pitting edema   Basic Metabolic Panel:  Recent Labs Lab 07/09/14 0500 07/10/14 0946 07/11/14 0510 07/12/14 0550 07/13/14 0408  NA 140 138 139 138 138  K 3.8 3.5 3.1* 3.7 3.8  CL 106 107 110 110 112  CO2 23 22 20  17* 17*  GLUCOSE 158* 145* 151* 135* 141*  BUN 17 16 13 12 10   CREATININE 2.44* 2.21* 2.20* 2.28* 2.16*  CALCIUM 9.5 9.3 9.2 9.3 9.8  MG 1.9 1.9 1.9 1.9 2.0   Liver Function Tests:  Recent Labs Lab 07/09/14 0500 07/10/14 0946 07/11/14 0510 07/12/14 0550 07/13/14 0408  AST 41* 23 17 15 17   ALT 43* 31 23 17 16   ALKPHOS 41 45 42 40 42  BILITOT 0.3 0.5 0.5 0.4 0.4  PROT 5.0* 5.0* 4.8* 4.8* 5.1*  ALBUMIN 2.4* 2.5* 2.4* 2.3* 2.3*    Recent Labs Lab 07/07/14 1100  LIPASE 20   No results for input(s): AMMONIA in the last 168 hours. CBC:  Recent Labs Lab 07/07/14 1310  07/09/14 0500 07/10/14 0946 07/11/14 0510 07/12/14 0550 07/13/14 0408  WBC 10.4  < > 11.5* 11.8* 9.7 10.9* 10.6*  NEUTROABS 8.9*  --   --   --   --   --   --   HGB 10.4*  < > 8.0* 8.2* 7.6* 7.4* 7.9*  HCT 30.3*  < > 23.6* 23.1* 21.8* 21.6* 22.7*  MCV 87.8  < > 88.4 86.2 86.2 87.8 86.0  PLT 323  < > 220 205 205 191 234  < > = values in this interval not displayed. Cardiac Enzymes:   No results for input(s): CKTOTAL, CKMB, CKMBINDEX,  TROPONINI in the last 168 hours. BNP (last 3 results)  Recent Labs  07/11/14 1000  BNP 82.7    ProBNP (last 3 results)  Recent Labs  08/27/13 2205 08/28/13 0454  PROBNP 263.1* 251.0*    CBG:  Recent Labs Lab 07/12/14 1212 07/12/14 1732 07/12/14 2152 07/13/14 0821 07/13/14 1223  GLUCAP 269* 129* 148* 129* 210*    Recent Results (from the past 240 hour(s))  Culture, blood (x 2)     Status: None (Preliminary result)   Collection Time: 07/07/14  6:30 PM  Result Value Ref Range Status   Specimen Description BLOOD LEFT ANTECUBITAL  Final   Special Requests BOTTLES DRAWN AEROBIC AND ANAEROBIC 5CC  Final   Culture   Final           BLOOD CULTURE RECEIVED NO GROWTH TO DATE CULTURE WILL BE HELD FOR 5 DAYS BEFORE ISSUING A FINAL NEGATIVE REPORT Performed at [facility]    Report Status PENDING  Incomplete  Culture, blood (x  2)     Status: None (Preliminary result)   Collection Time: 07/07/14  6:45 PM  Result Value Ref Range Status   Specimen Description BLOOD LEFT HAND  Final   Special Requests BOTTLES DRAWN AEROBIC AND ANAEROBIC 5CC  Final   Culture   Final           BLOOD CULTURE RECEIVED NO GROWTH TO DATE CULTURE WILL BE HELD FOR 5 DAYS BEFORE ISSUING A FINAL NEGATIVE REPORT Performed at [facility]    Report Status PENDING  Incomplete     Studies: No results found.  Scheduled Meds: . feeding supplement (ENSURE ENLIVE)  237 mL Oral BID BM  . insulin aspart  0-5 Units Subcutaneous QHS  . insulin aspart  0-9 Units Subcutaneous TID WC  . isosorbide-hydrALAZINE  1 tablet Oral TID  . labetalol  100 mg Oral TID  . magnesium chloride  2 tablet Oral BID  . pantoprazole  40 mg Oral BID AC  . sodium chloride  1,000 mL Intravenous Once  . sodium chloride  10-40 mL Intracatheter Q12H  . sodium chloride  3 mL Intravenous Q12H  . vitamin B-12  1,000 mcg Oral Daily    Continuous Infusions:     Time spent: 15 minutes  [place]  [facility] Pager [phone]. If 7PM-7AM, please contact night-coverage at [email or web address], password [facility] 07/13/2014, 4:05 PM  LOS: 6 days

## 2014-07-14 ENCOUNTER — Telehealth: Payer: Self-pay | Admitting: *Deleted

## 2014-07-14 DIAGNOSIS — N189 Chronic kidney disease, unspecified: Secondary | ICD-10-CM

## 2014-07-14 DIAGNOSIS — K209 Esophagitis, unspecified: Secondary | ICD-10-CM

## 2014-07-14 DIAGNOSIS — D631 Anemia in chronic kidney disease: Secondary | ICD-10-CM

## 2014-07-14 LAB — CBC
HEMATOCRIT: 21.5 % — AB (ref 36.0–46.0)
Hemoglobin: 7.5 g/dL — ABNORMAL LOW (ref 12.0–15.0)
MCH: 30 pg (ref 26.0–34.0)
MCHC: 34.9 g/dL (ref 30.0–36.0)
MCV: 86 fL (ref 78.0–100.0)
Platelets: 234 10*3/uL (ref 150–400)
RBC: 2.5 MIL/uL — AB (ref 3.87–5.11)
RDW: 15.7 % — ABNORMAL HIGH (ref 11.5–15.5)
WBC: 10.1 10*3/uL (ref 4.0–10.5)

## 2014-07-14 LAB — GLUCOSE, CAPILLARY
GLUCOSE-CAPILLARY: 130 mg/dL — AB (ref 70–99)
Glucose-Capillary: 154 mg/dL — ABNORMAL HIGH (ref 70–99)
Glucose-Capillary: 134 mg/dL — ABNORMAL HIGH (ref 70–99)

## 2014-07-14 LAB — BASIC METABOLIC PANEL
Anion gap: 8 (ref 5–15)
BUN: 7 mg/dL (ref 6–23)
CO2: 17 mmol/L — ABNORMAL LOW (ref 19–32)
Calcium: 9.6 mg/dL (ref 8.4–10.5)
Chloride: 113 mmol/L — ABNORMAL HIGH (ref 96–112)
Creatinine, Ser: 1.98 mg/dL — ABNORMAL HIGH (ref 0.50–1.10)
GFR, EST AFRICAN AMERICAN: 28 mL/min — AB (ref >90)
GFR, EST NON AFRICAN AMERICAN: 24 mL/min — AB (ref >90)
Glucose, Bld: 132 mg/dL — ABNORMAL HIGH (ref 70–99)
POTASSIUM: 3.8 mmol/L (ref 3.5–5.1)
Sodium: 138 mmol/L (ref 135–145)

## 2014-07-14 LAB — CULTURE, BLOOD (ROUTINE X 2)
Culture: NO GROWTH
Culture: NO GROWTH

## 2014-07-14 MED ORDER — LABETALOL HCL 100 MG PO TABS: 100.0000 mg | ORAL_TABLET | Freq: Three times a day (TID) | ORAL | Status: DC

## 2014-07-14 MED ORDER — ASPIRIN EC 81 MG PO TBEC: 81.0000 mg | DELAYED_RELEASE_TABLET | Freq: Every day | ORAL | Status: DC

## 2014-07-14 MED ORDER — ENSURE ENLIVE PO LIQD: 237.0000 mL | Freq: Two times a day (BID) | ORAL | Status: DC

## 2014-07-14 MED ORDER — ISOSORB DINITRATE-HYDRALAZINE 20-37.5 MG PO TABS: 1.0000 | ORAL_TABLET | Freq: Three times a day (TID) | ORAL | Status: DC

## 2014-07-14 MED ORDER — CYANOCOBALAMIN 1000 MCG PO TABS: 1000.0000 ug | ORAL_TABLET | Freq: Every day | ORAL | Status: DC

## 2014-07-14 MED ORDER — OMEPRAZOLE 20 MG PO CPDR: 20.0000 mg | DELAYED_RELEASE_CAPSULE | Freq: Every morning | ORAL | Status: DC

## 2014-07-14 NOTE — Telephone Encounter (Signed)
Spoke to the pts granddaughter on the phone and was able to get her appt rescheduled.

## 2014-07-14 NOTE — Progress Notes (Signed)
Physical Therapy Treatment Patient Details Name: Michelle Farrell MRN: [id number] DOB: 1940/12/18 Today's Date: 07/14/2014    History of Present Illness 74 y.o. female with a history of Crohn's disease, Diastolic CHF, HTN, IDDM, and Hyperlipidemia, COPD, neuropathy, chronic back pain admitted again for epigastric pain and intractable N/V/D.  Upper GI showed esophagitis, gastritis and duodenitis.  Work up continues.    PT Comments    Improving slowly in gait stability, general mobility, following direction and initiation.  Follow Up Recommendations  SNF     Equipment Recommendations  None recommended by PT    Recommendations for Other Services       Precautions / Restrictions      Mobility  Bed Mobility Overal bed mobility: Needs Assistance Bed Mobility: Supine to Sit   Sidelying to sit: Min guard (use of rail)       General bed mobility comments: less struggle today, more initiation and follow through without repetitive cues.  Transfers Overall transfer level: Needs assistance   Transfers: Sit to/from Stand Sit to Stand: Min guard         General transfer comment: cues for safety and hand placement  Ambulation/Gait Ambulation/Gait assistance: Min guard Ambulation Distance (Feet): 250 Feet Assistive device: Rolling walker (2 wheeled) Gait Pattern/deviations: Step-through pattern Gait velocity: slow and guarded Gait velocity interpretation: Below normal speed for age/gender General Gait Details: generally smoother and more steady gait with the RW, but starts progressively walking further and further behind the RW.  Without assistive device, pt is more guarded and mildly unsteady   Stairs            Wheelchair Mobility    Modified Rankin (Stroke Patients Only) Modified Rankin (Stroke Patients Only) Pre-Morbid Rankin Score: No symptoms Modified Rankin: Moderate disability     Balance Overall balance assessment: Needs assistance Sitting-balance  support: No upper extremity supported Sitting balance-Leahy Scale: Good     Standing balance support: No upper extremity supported Standing balance-Leahy Scale: Fair                      Cognition Arousal/Alertness: Awake/alert Behavior During Therapy: WFL for tasks assessed/performed Overall Cognitive Status: Within Functional Limits for tasks assessed         Following Commands: Follows one step commands consistently            Exercises      General Comments General comments (skin integrity, edema, etc.): pt deferred exercise today.      Pertinent Vitals/Pain Pain Assessment: No/denies pain    Home Living                      Prior Function            PT Goals (current goals can now be found in the care plan section) Acute Rehab PT Goals Patient Stated Goal: go home Time For Goal Achievement: 07/24/14 Potential to Achieve Goals: Fair Progress towards PT goals: Progressing toward goals    Frequency  Min 3X/week    PT Plan Current plan remains appropriate    Co-evaluation             End of Session   Activity Tolerance: Patient tolerated treatment well;Patient limited by fatigue Patient left: in chair;with call bell/phone within reach;with family/visitor present     Time: 1240-1301 PT Time Calculation (min) (ACUTE ONLY): 21 min  Charges:  $Gait Training: 8-22 mins  G Codes:      Pricella Gaugh, Tessie Fass 07/14/2014, 2:54 PM 07/14/2014  Donnella Sham, [place] [phone]  (pager)

## 2014-07-14 NOTE — Progress Notes (Signed)
Discharge instructions were completed with patient and patient family (daughter) at bedside. Demonstration, understanding, and teach back were successful. 6:42 PM Carmela Hurt, RN

## 2014-07-14 NOTE — Discharge Summary (Signed)
Discharge Summary  Michelle Farrell BSJ:[id number] DOB: 06/11/1940  PCP: No primary care provider on file.  Admit date: 07/07/2014 Discharge date: 07/14/2014  Time spent: 25 minutes  Recommendations for Outpatient Follow-up:  1. New medications: B-12 1000 g by mouth daily  2. Medication change: Patient's Januvia on hold given decreased by mouth intake. This medication may be resumed as patient's sugars trend up 3. Patient should continue to have CBGs checked 4 times a day before meals and at bedtime. Please restart Januvia at 25 mg by mouth daily and titrate up for sugars staying above 200 4. New medication: Ensure supplement by mouth twice a day between meals  5. Medication change: Prilosec increased to 20 mg by mouth twice a day 6. Medication change: Lisinopril and spironolactone discontinued secondary to renal failure 7. New medication: BiDil 3 times a day 8. New medication: Labetalol 100 mg by mouth 3 times a day 9. Medication change: Aspirin 325 mg decreased to 81 mg by mouth daily in the setting of hematemesis, however patient with recent cerebral infarction 10. Patient will need an outpatient pelvic ultrasound to clarify adnexal mass seen on CT on 4/11. This is not emergent and can be done following discharge from short-term skilled nursing  Discharge Diagnoses:  Active Hospital Problems   Diagnosis Date Noted  . Anemia in chronic kidney disease 07/13/2014  . Nausea with vomiting 07/08/2014  . Abnormal CT scan, stomach 07/08/2014  . Duodenitis with hemorrhage 07/08/2014  . Gastritis and gastroduodenitis 07/08/2014  . Esophagitis determined by endoscopy 07/08/2014  . Hematemesis with nausea 07/07/2014  . Bradycardia 07/04/2014  . Bacterial meningitis   . 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 CVA (cerebral infarction)   . Sepsis 06/19/2014  . Metabolic acidosis 76/54/6503  . AKI (acute kidney injury) 05/16/2014  . Diastolic CHF 54/65/6812  . Pelvic mass 05/16/2014    . Accelerated hypertension 08/28/2013  . Crohn disease 06/28/2007    Resolved Hospital Problems   Diagnosis Date Noted Date Resolved  No resolved problems to display.    Discharge Condition: Improved, being discharged to skilled nursing facility  Diet recommendation: Heart healthy with feeding supplement twice a day  Filed Weights   07/12/14 0528 07/13/14 0543 07/14/14 0621  Weight: 77 kg (169 lb 12.1 oz) 77.2 kg (170 lb 3.1 oz) 78 kg (171 lb 15.3 oz)    History of present illness:  The patient is a 74 y.o. 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 She had admissions in February and March of this year for gastroenteritis symptoms (n/v/d). She returned to the hospital on 06/19/14 with fever, altered mental status, and severe sepsis. There was concern for meningitis. She was found to have a UTI and a CVA. She was evaluated by neurology and infectious disease, stabilized and was transferred to [place] on 06/27/2014 on vancomycin and ceftriaxone. Infectious Disease continued to follow her in [facility]. On 4/8, she was found to have acute renal failure and a vancomycin trough level of 47. She was also on lisinopril and spironolactone. She was also noted to be bradycardic. She was started on IV fluids, her ACE inhibitor and spironolactone were discontinued, and she had a mild improvement in her kidney function. For bradycardia, her clonidine was discontinued and she was started on hydralazine. On 4/10 into 4/11, she developed epigastric pain with intractable nausea and vomiting. She had bilious and bloody emesis. She  was readmitted for further evaluation. Labs were concerning for a positive gap metabolic acidosis, rising creatinine, decreasing hemoglobin  Hospital Course:  She developed a low-grade fever with tachycardia. Her blood pressure was elevated.  For her vomiting, she underwent EGD on 4/12 which demonstrated esophagitis, gastritis, and duodenitis. Her temperature is starting to trend down with broader spectrum antibiotics.After further workup, it was felt that her fever may be more response is a drug reaction to vancomycin as well as the renal failure causes. Patient initially was hydrated and over time, her creatinine has trended down to around 2, but with a persistently decreased GFR. In addition, her hemoglobin has been trending down and has been around 7.5-8 with no signs of acute blood loss.   Assessment/Plan: Active Problems:  Crohn disease  Accelerated hypertension: Lisinopril and spironolactone discontinued due to renal failure. On BiDil 3 times a day and labetalol. Clonidine discontinued secondary to bradycardia. Blood pressures continue to remain stable   AKI (acute kidney injury) secondary to drug-induced the setting of dehydration plus ACE inhibitor. Following elevation of renal function yesterday, with IV fluids better. Staying stable around 2.2  Pelvic mass: ? cyst. For outpatient pelvic ultrasound, nonurgent. Can be done after skilled nursing   Diastolic CHF: Has remained stable. Monitor closely in the setting of hydration of kidneys. No signs of volume overload.   Metabolic acidosis: Resolved, likely the setting of uremia from renal failure   Sepsis/fever while on an about eczema setting of recent meningitis. Evaluated during previous admission by infectious disease. No obvious source. Suspect this could be vancomycin toxicity and tremor related fever. Neck minus discontinued from acute kidney injury and vancomycin level down to 20. On cefepime plus Flagyl, discussed with infectious disease and and stopped on 4/17. Lactic acid level within normal limits.  Anemia: No evidence of acute blood loss since her EGD, although overall trend downward.. This may be more secondary to renal disease. B-12 added to  regimen.  Diabetes mellitus: Patient previously had been on Januvia. 3 poor by mouth intake, she's not been on any Januvia since her previous hospitalization. Would favor continuing to check her CBGs. When she has sustained CBGs over 200, restart Januvia 25 mg by mouth daily and titrate upward accordingly.   Bacterial meningitis: Discussed with infectious disease.Although there is not a strong urgency to continue vancomycin, patient was continued then on cefepime and Flagyl after transfer back to hospital. She completed 7 more days and has been off of antibiotics since 4/17 and afebrile since.  Bradycardia: Resolved. Secondary to clonidine which has been discontinued. TSH normal.   Esophagitis /gastritis/duodenitis causing intractable nausea/vomiting/hematemesis: Status post EGD. Continue permanent twice a day PPI.  diet advanced and she is tolerating solid food aspirin 325 initially held because of this reason. We started at 81 mg by mouth daily given history of recent thrombotic stroke and now that renal function is better .   Procedures:  Status post endoscopy done 4/12 noting gastritis, duodenitis and esophagitis  Consultations:  [facility]  Discharge Exam: BP 119/44 mmHg  Pulse 66  Temp(Src) 97.9 F (36.6 C) (Oral)  Resp 18  Ht 5' 6"  (1.676 m)  Wt 78 kg (171 lb 15.3 oz)  BMI 27.77 kg/m2  SpO2 98%  General: Alert and oriented 3, no acute distress Cardiovascular: Regular rate and rhythm, S1-S2 Respiratory: Clear to auscultation bilaterally  Discharge Instructions You were cared for by a hospitalist during your hospital stay. If you have any questions about your discharge  medications or the care you received while you were in the hospital after you are discharged, you can call the unit and asked to speak with the hospitalist on call if the hospitalist that took care of you is not available. Once you are discharged, your primary care physician will handle any  further medical issues. Please note that NO REFILLS for any discharge medications will be authorized once you are discharged, as it is imperative that you return to your primary care physician (or establish a relationship with a primary care physician if you do not have one) for your aftercare needs so that they can reassess your need for medications and monitor your lab values.  Discharge Instructions    Diet - low sodium heart healthy    Complete by:  As directed      Increase activity slowly    Complete by:  As directed             Medication List    STOP taking these medications        HYDROcodone-acetaminophen 10-325 MG per tablet  Commonly known as:  NORCO     sitaGLIPtin 100 MG tablet  Commonly known as:  JANUVIA      TAKE these medications        aspirin EC 81 MG tablet  Take 1 tablet (81 mg total) by mouth daily.     cyanocobalamin 1000 MCG tablet  Take 1 tablet (1,000 mcg total) by mouth daily.     feeding supplement (ENSURE ENLIVE) Liqd  Take 237 mLs by mouth 2 (two) times daily between meals.     folic acid 1 MG tablet  Commonly known as:  FOLVITE  Take 1 mg by mouth daily.     isosorbide-hydrALAZINE 20-37.5 MG per tablet  Commonly known as:  BIDIL  Take 1 tablet by mouth 3 (three) times daily.     labetalol 100 MG tablet  Commonly known as:  NORMODYNE  Take 1 tablet (100 mg total) by mouth 3 (three) times daily.     olopatadine 0.1 % ophthalmic solution  Commonly known as:  PATANOL  Place 1 drop into both eyes 2 (two) times daily.     omeprazole 20 MG capsule  Commonly known as:  PRILOSEC  Take 1 capsule (20 mg total) by mouth every morning.     ondansetron 4 MG tablet  Commonly known as:  ZOFRAN  Take 1 tablet (4 mg total) by mouth every 6 (six) hours as needed for nausea.     PROAIR HFA 108 (90 BASE) MCG/ACT inhaler  Generic drug:  albuterol  Inhale 1 puff into the lungs every 6 (six) hours as needed. Shortness of breath or wheezing      traZODone 100 MG tablet  Commonly known as:  DESYREL  Take 100 mg by mouth at bedtime.     Vitamin D 2000 UNITS Caps  Take 1 capsule by mouth daily.       No Known Allergies    The results of significant diagnostics from this hospitalization (including imaging, microbiology, ancillary and laboratory) are listed below for reference.    Significant Diagnostic Studies: Ct Abdomen Pelvis Wo Contrast  07/07/2014     IMPRESSION: 1. Marked thickening of the stomach wall, particularly within the antral region. Considerations include gastritis, peptic ulcer disease. Metastases can have a similar appearance in the setting of known malignancy. The finding would be atypical for primary gastric malignancy. 2. Cardiomegaly and coronary artery disease. 3. Small  right renal cyst, stable in appearance. 4. Low-attenuation pelvic structure, favoring right adnexal cyst, 6.4 cm. This is not completely characterized. Consider further evaluation with pelvic ultrasound. 5. Stable fat containing anterior abdominal wall hernia.   Electronically Signed   By: Nolon Nations M.D.   On: 07/07/2014 19:36     Dg Chest Port 1 View  07/07/2014   CLINICAL DATA:  Right upper quadrant pain  EXAM: PORTABLE CHEST - 1 VIEW  COMPARISON:  06/19/2014  FINDINGS: Cardiac shadow is within normal limits. A new right-sided PICC line is noted in satisfactory position at the cavoatrial junction. The lungs are well aerated bilaterally. No focal infiltrate or sizable effusion is seen. No bony abnormality is noted.  IMPRESSION: PICC line in satisfactory position.  No acute abnormality noted.   Electronically Signed   By: Inez Catalina M.D.   On: 07/07/2014 13:19     Dg Abd Portable 1v  07/07/2014   CLINICAL DATA:  Persistent vomiting.  EXAM: PORTABLE ABDOMEN - 1 VIEW  COMPARISON:  CT 05/16/2014  FINDINGS: Prior cholecystectomy. Nonobstructive bowel gas pattern. No free air organomegaly. No suspicious calcification. No acute bony  abnormality.  IMPRESSION: No obstruction or free air.   Electronically Signed   By: Rolm Baptise M.D.   On: 07/07/2014 09:34      Microbiology: Recent Results (from the past 240 hour(s))  Culture, blood (x 2)     Status: None   Collection Time: 07/07/14  6:30 PM  Result Value Ref Range Status   Specimen Description BLOOD LEFT ANTECUBITAL  Final   Special Requests BOTTLES DRAWN AEROBIC AND ANAEROBIC 5CC  Final   Culture   Final    NO GROWTH 5 DAYS Performed at [facility]    Report Status 07/14/2014 FINAL  Final  Culture, blood (x 2)     Status: None   Collection Time: 07/07/14  6:45 PM  Result Value Ref Range Status   Specimen Description BLOOD LEFT HAND  Final   Special Requests BOTTLES DRAWN AEROBIC AND ANAEROBIC 5CC  Final   Culture   Final    NO GROWTH 5 DAYS Performed at [facility]    Report Status 07/14/2014 FINAL  Final     Labs: Basic Metabolic Panel:  Recent Labs Lab 07/09/14 0500 07/10/14 0946 07/11/14 0510 07/12/14 0550 07/13/14 0408 07/14/14 0358  NA 140 138 139 138 138 138  K 3.8 3.5 3.1* 3.7 3.8 3.8  CL 106 107 110 110 112 113*  CO2 23 22 20  17* 17* 17*  GLUCOSE 158* 145* 151* 135* 141* 132*  BUN 17 16 13 12 10 7   CREATININE 2.44* 2.21* 2.20* 2.28* 2.16* 1.98*  CALCIUM 9.5 9.3 9.2 9.3 9.8 9.6  MG 1.9 1.9 1.9 1.9 2.0  --    Liver Function Tests:  Recent Labs Lab 07/09/14 0500 07/10/14 0946 07/11/14 0510 07/12/14 0550 07/13/14 0408  AST 41* 23 17 15 17   ALT 43* 31 23 17 16   ALKPHOS 41 45 42 40 42  BILITOT 0.3 0.5 0.5 0.4 0.4  PROT 5.0* 5.0* 4.8* 4.8* 5.1*  ALBUMIN 2.4* 2.5* 2.4* 2.3* 2.3*   No results for input(s): LIPASE, AMYLASE in the last 168 hours. No results for input(s): AMMONIA in the last 168 hours. CBC:  Recent Labs Lab 07/10/14 0946 07/11/14 0510 07/12/14 0550 07/13/14 0408 07/14/14 0358  WBC 11.8* 9.7 10.9* 10.6* 10.1  HGB 8.2* 7.6* 7.4* 7.9* 7.5*  HCT 23.1* 21.8* 21.6* 22.7* 21.5*  MCV 86.2  86.2 87.8 86.0 86.0  PLT 205 205 191 234 234   Cardiac Enzymes: No results for input(s): CKTOTAL, CKMB, CKMBINDEX, TROPONINI in the last 168 hours. BNP: BNP (last 3 results)  Recent Labs  07/11/14 1000 07/13/14 1746  BNP 82.7 125.4*    ProBNP (last 3 results)  Recent Labs  08/27/13 2205 08/28/13 0454  PROBNP 263.1* 251.0*    CBG:  Recent Labs Lab 07/13/14 1223 07/13/14 1741 07/13/14 2231 07/14/14 0742 07/14/14 1151  GLUCAP 210* 170* 173* 130* 154*       Signed:  Trystan Eads K  [facility] 07/14/2014, 3:22 PM

## 2014-07-14 NOTE — Progress Notes (Signed)
1745: Instructed pt and granddaughter on site care . They verbalized understanding. Vaseline gauze applied . No bleeding noted.

## 2014-07-15 ENCOUNTER — Non-Acute Institutional Stay (SKILLED_NURSING_FACILITY): Payer: Medicare Other | Admitting: Adult Health

## 2014-07-15 ENCOUNTER — Encounter: Payer: Self-pay | Admitting: Adult Health

## 2014-07-15 DIAGNOSIS — I5032 Chronic diastolic (congestive) heart failure: Secondary | ICD-10-CM | POA: Diagnosis not present

## 2014-07-15 DIAGNOSIS — N189 Chronic kidney disease, unspecified: Secondary | ICD-10-CM | POA: Diagnosis not present

## 2014-07-15 DIAGNOSIS — E119 Type 2 diabetes mellitus without complications: Secondary | ICD-10-CM

## 2014-07-15 DIAGNOSIS — G009 Bacterial meningitis, unspecified: Secondary | ICD-10-CM | POA: Diagnosis not present

## 2014-07-15 DIAGNOSIS — R5381 Other malaise: Secondary | ICD-10-CM | POA: Diagnosis not present

## 2014-07-15 DIAGNOSIS — K209 Esophagitis, unspecified without bleeding: Secondary | ICD-10-CM

## 2014-07-15 DIAGNOSIS — N179 Acute kidney failure, unspecified: Secondary | ICD-10-CM | POA: Diagnosis not present

## 2014-07-15 DIAGNOSIS — R1900 Intra-abdominal and pelvic swelling, mass and lump, unspecified site: Secondary | ICD-10-CM

## 2014-07-15 DIAGNOSIS — I63339 Cerebral infarction due to thrombosis of unspecified posterior cerebral artery: Secondary | ICD-10-CM | POA: Diagnosis not present

## 2014-07-15 DIAGNOSIS — E43 Unspecified severe protein-calorie malnutrition: Secondary | ICD-10-CM | POA: Diagnosis not present

## 2014-07-15 DIAGNOSIS — I10 Essential (primary) hypertension: Secondary | ICD-10-CM | POA: Diagnosis not present

## 2014-07-15 DIAGNOSIS — D631 Anemia in chronic kidney disease: Secondary | ICD-10-CM | POA: Diagnosis not present

## 2014-07-15 NOTE — Progress Notes (Signed)
Patient ID: Michelle Farrell, female   DOB: Jan 13, 1941, 74 y.o.   MRN: [id number]   07/15/2014  Facility:  [facility]urs[facility] Home Location:  [place] Room Number: 1005-2 LEVEL OF CARE:  SNF (31)   Chief Complaint  Patient presents with  . Hospitalization Follow-up    Physical deconditioning, hypertension, acute renal failure, pelvic mass, diastolic CHF, anemia, diabetes mellitus, bacterial meningitis, esophagitis and CVA    HISTORY OF PRESENT ILLNESS:   This is a 74 year old female who has been admitted to [facility] on 07/14/14 from [facility]. She has PMH of heart failure, diabetes mellitus, chronic hip/bac/leg pain, hypertension and well-controlled Crohn's disease. She was recently, 06/19/14, admitted to the hospital for UTI and CVA. She was then transferred to [facility] rehabilitation on 4/1 with vancomycin and ceftriaxone. On 4/8, she had acute renal failure and a vancomycin trough level of 47. IV fluids were given and Ace and spironolactone were discontinued. She was bradycardic so her clonidine was discontinued and started on hydralazine. On 4/10, she developed epigastric pain, intractable nausea and vomiting and bloody emesis so she was readmitted to hospital for further evaluation.  EGD done in the hospital showed esophagitis, gastritis and duodenitis. Workup shows that her fever may be more of a drug reaction to vancomycin and renal failure. She was hydrated and her creatinine has trended down to 1.98 by with persistent decreased GFR.  PAST MEDICAL HISTORY:  Past Medical History  Diagnosis Date  . CHF (congestive heart failure)   . Hypertension   . Back pain, chronic   . Hip pain, chronic   . GERD (gastroesophageal reflux disease)   . Crohn disease   . Diabetes mellitus without complication   . Esophageal stricture     s/p dilation 2003, 2008  . PUD (peptic ulcer disease)   . CKD (chronic kidney disease)   . COPD (chronic obstructive  pulmonary disease)   . Arthritis   . AKI (acute kidney injury)   . Sleep apnea     CURRENT MEDICATIONS: Reviewed per MAR/see medication list  No Known Allergies   REVIEW OF SYSTEMS:  GENERAL: no change in appetite, no fatigue, no weight changes, no fever, chills or weakness RESPIRATORY: no cough, SOB, DOE, wheezing, hemoptysis CARDIAC: no chest pain, or palpitations GI: no abdominal pain, diarrhea, constipation, heart burn, nausea or vomiting  PHYSICAL EXAMINATION  GENERAL: no acute distress, normal body habitus EYES: conjunctivae normal, sclerae normal, normal eye lids NECK: supple, trachea midline, no neck masses, no thyroid tenderness, no thyromegaly LYMPHATICS: no LAN in the neck, no supraclavicular LAN RESPIRATORY: breathing is even & unlabored, BS CTAB CARDIAC: RRR, 3-6 systolic murmur on the right upper sternal border,no extra heart sounds, BLE edema 1+ GI: abdomen soft, normal BS, no masses, no tenderness, no hepatomegaly, no splenomegaly EXTREMITIES: able to move X 4 extremities PSYCHIATRIC: the patient is alert & oriented to person, affect & behavior appropriate  LABS/RADIOLOGY: Labs reviewed: Basic Metabolic Panel:  Recent Labs  07/11/14 0510 07/12/14 0550 07/13/14 0408 07/14/14 0358  NA 139 138 138 138  K 3.1* 3.7 3.8 3.8  CL 110 110 112 113*  CO2 20 17* 17* 17*  GLUCOSE 151* 135* 141* 132*  BUN 13 12 10 7   CREATININE 2.20* 2.28* 2.16* 1.98*  CALCIUM 9.2 9.3 9.8 9.6  MG 1.9 1.9 2.0  --    Liver Function Tests:  Recent Labs  07/11/14 0510 07/12/14 0550 07/13/14 0408  AST 17 15  17  ALT 23 17 16   ALKPHOS 42 40 42  BILITOT 0.5 0.4 0.4  PROT 4.8* 4.8* 5.1*  ALBUMIN 2.4* 2.3* 2.3*    Recent Labs  07/07/14 1100  LIPASE 20   CBC:  Recent Labs  06/19/14 1234  06/28/14 0430  07/07/14 1310  07/12/14 0550 07/13/14 0408 07/14/14 0358  WBC 16.1*  < > 5.7  < > 10.4  < > 10.9* 10.6* 10.1  NEUTROABS 13.5*  --  2.3  --  8.9*  --   --   --    --   HGB 12.5  < > 8.6*  < > 10.4*  < > 7.4* 7.9* 7.5*  HCT 35.8*  < > 24.9*  < > 30.3*  < > 21.6* 22.7* 21.5*  MCV 88.0  < > 86.8  < > 87.8  < > 87.8 86.0 86.0  PLT 204  < > 176  < > 323  < > 191 234 234  < > = values in this interval not displayed.  Lipid Panel:  Recent Labs  06/21/14 0550  HDL 27*   Cardiac Enzymes:  Recent Labs  08/28/13 0454 06/19/14 2023  CKTOTAL  --  624*  TROPONINI <0.30  --    CBG:  Recent Labs  07/14/14 0742 07/14/14 1151 07/14/14 1637  GLUCAP 130* 154* 134*    Ct Abdomen Pelvis Wo Contrast  07/07/2014   CLINICAL DATA:  Right and left upper quadrant pain. Projectile vomiting. Nausea, hematemesis. History of gastroenteritis, Crohn's, peptic ulcer disease, esophageal stricture.  EXAM: CT ABDOMEN AND PELVIS WITHOUT CONTRAST  TECHNIQUE: Multidetector CT imaging of the abdomen and pelvis was performed following the standard protocol without IV contrast.  COMPARISON:  CT of the abdomen and pelvis on 05/16/2014  FINDINGS: Lower chest: There is bibasilar atelectasis in the lung bases. The heart is enlarged in the areas significant coronary artery calcification.  Upper abdomen: No focal abnormality identified within the liver, spleen, pancreas, or adrenal glands. The low-attenuation lesion within the lateral portion of the right kidney is stable in appearance and consistent with a small cyst. The left kidney has a normal appearance. Ureters are unremarkable in appearance.  Gastrointestinal tract: The stomach wall appears diffusely thickened, particularly within the antral region with the wall measures 2.1 cm. This represents a new finding since the previous exam. There is no obstruction to contrast which passes into the nondilated loops of small bowel. Large bowel loops are nondilated but there is significant stool burden. The appendix is well seen and has a normal appearance.  Pelvis: There is a low-attenuation structure adjacent to the vaginal cuff, measuring 6.4  x 5.2 cm. Previously this measured 7.3 x 7.8 cm. This mass is low-attenuation consistent with cystic contents. A Foley catheter is identified within the bladder and is probably been clamped given the presence of fluid within the bladder. There is no free pelvic fluid.  Retroperitoneum: No retroperitoneal or mesenteric adenopathy. There is significant atherosclerotic calcification of the abdominal aorta.  Abdominal wall: A supraumbilical fat containing hernia measures 4.0 cm. This does not involve bowel loops in appears uncomplicated.Numerous soft tissue nodules are identified throughout the anterior abdominal wall. 2 sites in the right lower abdominal wall contain a small locular gas. The patient may be receiving numerous subcutaneous injections and correlation is recommended with history of injection sites.  Osseous structures: There is moderate degenerative change within the lumbar spine.  IMPRESSION: 1. Marked thickening of the stomach wall, particularly within the  antral region. Considerations include gastritis, peptic ulcer disease. Metastases can have a similar appearance in the setting of known malignancy. The finding would be atypical for primary gastric malignancy. 2. Cardiomegaly and coronary artery disease. 3. Small right renal cyst, stable in appearance. 4. Low-attenuation pelvic structure, favoring right adnexal cyst, 6.4 cm. This is not completely characterized. Consider further evaluation with pelvic ultrasound. 5. Stable fat containing anterior abdominal wall hernia.   Electronically Signed   By: Nolon Nations M.D.   On: 07/07/2014 19:36   Ct Head Wo Contrast  06/19/2014   CLINICAL DATA:  Altered mental status, nonverbal with fixed right gaze.  EXAM: CT HEAD WITHOUT CONTRAST  TECHNIQUE: Contiguous axial images were obtained from the base of the skull through the vertex without intravenous contrast.  COMPARISON:  None.  FINDINGS: Skull and Sinuses:No fracture or aggressive lesion. Fluid level in  the right maxillary sinus and mild scattered inflammatory mucosal thickening is likely incidental given the history.  Orbits: Rightward deviation of gaze.  Brain: No evidence of acute infarction, hemorrhage, hydrocephalus, or mass lesion/mass effect. The insular ribbon on the left is somewhat indistinct inferiorly, but symmetric. Mild periventricular and subcortical left occipital low-density is most likely chronic small vessel ischemia.  IMPRESSION: 1. No acute intracranial findings. 2. Mild chronic small vessel ischemia.   Electronically Signed   By: Monte Fantasia M.D.   On: 06/19/2014 14:50   Ct Cervical Spine Wo Contrast  06/19/2014   CLINICAL DATA:  Found on floor, with right-sided gaze. Concern for cervical spine injury. Initial encounter.  EXAM: CT CERVICAL SPINE WITHOUT CONTRAST  TECHNIQUE: Multidetector CT imaging of the cervical spine was performed without intravenous contrast. Multiplanar CT image reconstructions were also generated.  COMPARISON:  None.  FINDINGS: There is no evidence of fracture or subluxation. Vertebral bodies demonstrate normal height and alignment. Multilevel disc space narrowing is noted along the lower cervical spine, with anterior and posterior disc osteophyte complexes. Prevertebral soft tissues are within normal limits.  The thyroid gland is unremarkable in appearance. The visualized lung apices are clear. Calcification is noted at the carotid bifurcations, more prominent on the right. Mucosal thickening is noted at the left side of the sphenoid sinus.  IMPRESSION: 1. No evidence of fracture or subluxation along the cervical spine. 2. Mild degenerative change along the lower cervical spine. 3. Calcification at the carotid bifurcations, more prominent on the right. Carotid ultrasound would be helpful for further evaluation, when and as deemed clinically appropriate. 4. Mucosal thickening at the left side of the sphenoid sinus.   Electronically Signed   By: Garald Balding M.D.    On: 06/19/2014 17:29   Mr Virgel Paling Wo Contrast  06/21/2014   CLINICAL DATA:  74 year old female with altered mental status, found down. MRI reveals dorsal left thalamus signal abnormality suspicious for infarct. Initial encounter.  EXAM: MRA HEAD WITHOUT CONTRAST  TECHNIQUE: Angiographic images of the Circle of Willis were obtained using MRA technique without intravenous contrast.  COMPARISON:  Brain MRI 06/20/2014 and earlier.  FINDINGS: Study is mildly degraded by motion artifact despite repeated imaging attempts.  Antegrade flow in both distal vertebral arteries in the posterior circulation. Both PICA origins appear patent. Patent vertebrobasilar junction. No evidence of basilar artery stenosis.  Both PCA origins are patent. There is mild to moderate irregularity of the left PCA P1 segment in the region of the thalamostriate artery origin. Otherwise bilateral PCA branches are within normal limits.  Posterior communicating arteries are diminutive or  absent.  Antegrade flow in both ICA siphons. No evidence of siphon stenosis. Ophthalmic artery origins are within normal limits. Carotid termini are patent. MCA and ACA origins are patent.  Visualized bilateral MCA and ACA branches are degraded by motion, with no major branch occlusion identified.  IMPRESSION: 1. Mildly motion degraded despite repeated imaging attempts. 2. Left PCA P1 segment irregularity in the region of the left thalamostriate artery origin likely corresponds to the acute left thalamus finding. 3. No other abnormality is evident on this intracranial MRA.   Electronically Signed   By: Genevie Ann M.D.   On: 06/21/2014 10:01   Mr Brain Wo Contrast  06/20/2014   CLINICAL DATA:  Altered mental status. Found on the floor unresponsive yesterday.  EXAM: MRI HEAD WITHOUT CONTRAST  TECHNIQUE: Multiplanar, multiecho pulse sequences of the brain and surrounding structures were obtained without intravenous contrast.  COMPARISON:  CT HEAD WITHOUT CONTRAST  06/19/2014  FINDINGS: The diffusion-weighted images demonstrate an acute/subacute nonhemorrhagic infarct involving the posterior medial left thalamus. The infarct measures 10 mm maximally. Subtle T2 changes are associated with the infarct.  A remote left occipital lobe scratch side a remote medial left occipital lobe infarct is noted. Minimal scratch the mild periventricular white matter changes are present bilaterally. Minimal subcortical white matter change otherwise is predominantly in the anterior frontal lobes.  The ventricles are of normal size. Flow is present in the major intracranial arteries. 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 No other significant extra-axial fluid collections are present.  The globes and orbits are intact. Chronic right maxillary sinus disease is present. There is a polyp or mucous retention cyst within the left sphenoid sinus. The paranasal sinuses and mastoid air cells are otherwise clear. The skullbase is unremarkable. The intracranial midline structures are within normal limits. 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  IMPRESSION: 1. 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 2. Remote medial left occipital lobe infarct. 3. Atrophy and minimal white matter changes otherwise within normal limits for age. 4. Chronic right maxillary sinus disease.   Electronically Signed   By: San Morelle M.D.   On: 06/20/2014 07:35   Dg Chest Port 1 View  07/07/2014   CLINICAL DATA:  Right upper quadrant pain  EXAM: PORTABLE CHEST - 1 VIEW  COMPARISON:  06/19/2014  FINDINGS: Cardiac shadow is within normal limits. A new right-sided PICC line is noted in satisfactory position at the cavoatrial junction. The lungs are well aerated bilaterally. No focal infiltrate or sizable effusion is seen. No bony abnormality is noted.  IMPRESSION: PICC line in satisfactory position.  No acute abnormality noted.    Electronically Signed   By: Inez Catalina M.D.   On: 07/07/2014 13:19   Dg Chest Port 1 View  06/19/2014   CLINICAL DATA:  74 year old female with altered mental status, found down this morning. Recent hospitalization for gastroenteritis. Initial encounter.  EXAM: PORTABLE CHEST - 1 VIEW  COMPARISON:  CT Abdomen and Pelvis 05/16/2014, and earlier.  FINDINGS: Portable AP semi upright view at 1241 hours. Lower lung volumes. Normal cardiac size and mediastinal contours. Allowing for portable technique, the lungs are clear. No pneumothorax or pleural effusion identified.  IMPRESSION: No acute cardiopulmonary abnormality.   Electronically Signed   By: Genevie Ann M.D.   On: 06/19/2014 13:25   Dg Abd Portable 1v  07/07/2014   CLINICAL DATA:  Persistent vomiting.  EXAM: PORTABLE ABDOMEN -  1 VIEW  COMPARISON:  CT 05/16/2014  FINDINGS: Prior cholecystectomy. Nonobstructive bowel gas pattern. No free air organomegaly. No suspicious calcification. No acute bony abnormality.  IMPRESSION: No obstruction or free air.   Electronically Signed   By: Rolm Baptise M.D.   On: 07/07/2014 09:34   Dg Fluoro Guide Lumbar Puncture  06/20/2014   CLINICAL DATA:  Meningitis  EXAM: DIAGNOSTIC LUMBAR PUNCTURE UNDER FLUOROSCOPIC GUIDANCE  FLUOROSCOPY TIME:  Radiation Exposure Index (as provided by the fluoroscopic device): 15.3 mGy entrance does  If the device does not provide the exposure index:  Fluoroscopy Time (in minutes and seconds):  0.8 min  Number of Acquired Images:  3  PROCEDURE: Informed consent was obtained from the patient's granddaughter and power of attorney prior to the procedure. With the patient prone, the lower back was prepped with Betadine. 1% Lidocaine was used for local anesthesia. Lumbar puncture was performed at the L3-4 level using a 20 gauge needle with return of clear CSF. 8 ml of CSF were obtained for laboratory studies. The patient tolerated the procedure well and there were no apparent complications.   IMPRESSION: Successful lumbar puncture with fluoroscopy.   Electronically Signed   By: Monte Fantasia M.D.   On: 06/20/2014 11:46    ASSESSMENT/PLAN:  Physical deconditioning - for rehabilitation Bacterial meningitis - septicemic and Flagyl were discontinued on 4/17 Hypertension -  Continue Bidil 20-37.5 mg 1 tab by mouth 3 times a day and labetalol 100 mg by mouth 3 times a day Esophagitis - S/P EGD; increase omeprazole to 20 mg by mouth twice a day CVA - continue aspirin 81 mg by mouth daily Pelvic mass - for ultrasound Anemia of chronic kidney disease - hemoglobin 7.5; will monitor  Acute renal failure - creatinine 1.98; will monitor Diastolic CHF - stable Diabetes mellitus, type II - hgbA1c 5.3; continue CBG before meals and at bedtime; will restart Januvia 25 mg by mouth daily once CBGs over 200 Protein calorie malnutrition - albumin 2.3; RD consult   Goals of care:  Short-term rehabilitation  Labs/test ordered:  BMP, CBC and ultrasound of pelvic mass   Spent 50 minutes in patient care.    [facility], NP [employer] [phone]

## 2014-07-15 NOTE — Clinical Social Work Placement (Signed)
Clinical Social Work Department CLINICAL SOCIAL WORK PLACEMENT NOTE 07/10/2014  Patient: [name], Michelle Farrell Account Number: [id number] [place] date: 07/07/2014  Clinical Social Worker: Barbette Or, LCSW Date/time: 07/10/2014 03:30 PM  Clinical Social Work is seeking post-discharge placement for this patient at the following level of care: SKILLED NURSING (*CSW will update this form in Epic as items are completed)   07/10/2014 Patient/family provided with [place] Department of Clinical Social Work's list of facilities offering this level of care within the geographic area requested by the patient (or if unable, by the patient's family).  07/10/2014 Patient/family informed of their freedom to choose among providers that offer the needed level of care, that participate in Medicare, Medicaid or managed care program needed by the patient, have an available bed and are willing to accept the patient.  07/10/2014 Patient/family informed of [facility]' ownership interest in [facility], as well as of the fact that they are under no obligation to receive care at this facility.  PASARR submitted to EDS on 07/10/2014 PASARR number received on   FL2 transmitted to all facilities in geographic area requested by pt/family on 07/10/2014 FL2 transmitted to all facilities within larger geographic area on   Patient informed that his/her managed care company has contracts with or will negotiate with certain facilities, including the following:    Patient/family informed of bed offers received: 07/14/14 Patient chooses bed at [facility]  Physician recommends and patient chooses bed at   Patient to be transferred to [facility] on4/18/16  Patient to be transferred to facility by Daughter wishes to transport Patient and family notified of transfer on Daughter at bedside on 07/14/14 Name of family member notified: Andrey Spearman  The following physician request were entered in  Epic:   Additional Comments:  Per MD patient ready for DC to [facility]. RN, patient, patient's family, and facility notified of DC. RN given number for report. DC packet on chart. Patient transported by daughter, packet given to daughter by RN. CSW signing off.    Liz Beach MSW, [place], [place], [phone]

## 2014-07-16 ENCOUNTER — Non-Acute Institutional Stay (SKILLED_NURSING_FACILITY): Payer: Medicare Other | Admitting: Internal Medicine

## 2014-07-16 DIAGNOSIS — I5032 Chronic diastolic (congestive) heart failure: Secondary | ICD-10-CM

## 2014-07-16 DIAGNOSIS — R5381 Other malaise: Secondary | ICD-10-CM

## 2014-07-16 DIAGNOSIS — K2990 Gastroduodenitis, unspecified, without bleeding: Secondary | ICD-10-CM | POA: Diagnosis not present

## 2014-07-16 DIAGNOSIS — R1110 Vomiting, unspecified: Secondary | ICD-10-CM

## 2014-07-16 DIAGNOSIS — E46 Unspecified protein-calorie malnutrition: Secondary | ICD-10-CM | POA: Diagnosis not present

## 2014-07-16 DIAGNOSIS — D649 Anemia, unspecified: Secondary | ICD-10-CM

## 2014-07-16 DIAGNOSIS — K2970 Gastritis, unspecified, without bleeding: Secondary | ICD-10-CM

## 2014-07-16 DIAGNOSIS — R1900 Intra-abdominal and pelvic swelling, mass and lump, unspecified site: Secondary | ICD-10-CM

## 2014-07-16 DIAGNOSIS — E118 Type 2 diabetes mellitus with unspecified complications: Secondary | ICD-10-CM | POA: Diagnosis not present

## 2014-07-16 DIAGNOSIS — N179 Acute kidney failure, unspecified: Secondary | ICD-10-CM | POA: Diagnosis not present

## 2014-07-16 DIAGNOSIS — I10 Essential (primary) hypertension: Secondary | ICD-10-CM

## 2014-07-16 DIAGNOSIS — N189 Chronic kidney disease, unspecified: Secondary | ICD-10-CM

## 2014-07-16 DIAGNOSIS — I639 Cerebral infarction, unspecified: Secondary | ICD-10-CM

## 2014-07-16 NOTE — Progress Notes (Signed)
Patient ID: Michelle Farrell, female   DOB: Jul 16, 1940, 74 y.o.   MRN: [id number]     [facility]   PCP: No primary care provider on file.  Code Status: full code  No Known Allergies  Chief Complaint  Patient presents with  . New Admit To SNF     HPI:  74 year old patient is here for short term rehabilitation post hospital and inpatient rehab admission from 06/19/14-07/14/14. She was admitted initially with AMS with sepsis thought to be from UTI and questionable bacterial meningitis. She also had a CVA. Neurology and ID were involved. She was then transferred to inpatient rehab on iv antibiotics. She developed acute renal failure thought to be from vancomycin toxicity and her diuretics. She required iv fluids. Her vancomycin and diuretics were stopped. Following this she had abdominal pain with hematemesis and had metabolic acidosis. She was readmitted to the hospital. She underwent EGD showing esophagitis, gastritis and duodenitis. She was switched to cefepime and flagyl for additional 7 days for presumed meningitis. She was started on PPI. She did not require any blood transfusion. She was noted to have a cyst/mass posterior to bladder on ct abdomen which is pending outpatient workup.  She has PMH of  congestive heart failure, chron's disease, diabetes mellitus, chronic pain and hypertension. She is seen in her room today. Her caregiver is present in the room and her granddaughter over the phone during the visit. She continues to have nausea and intermittent vomiting. As per grand daughter she has been having this for almost 2 months now. She had nausea and vomiting this am. None at present. Ate minimal lunch. Denies abdominal pain. As per caregiver, had a bowel movement this am with loose stool. No blood in stool. Patient is alert and oriented to person and place only. She is in no distress.     Review of Systems:  Constitutional: Negative for fever, chills,  diaphoresis. positive for fatigue HENT: Negative for headache, congestion, nasal discharge Eyes: Negative for eye pain, blurred vision, double vision and discharge.  Respiratory: Negative for cough, shortness of breath and wheezing.   Cardiovascular: Negative for chest pain, palpitations, leg swelling.  Gastrointestinal: Negative for heartburn, abdominal pain, melena, rectal bleed. Poor appetite Genitourinary: Negative for dysuria Musculoskeletal: Negative for back pain, falls Skin: Negative for itching, rash.  Neurological: Negative for dizziness, tingling Psychiatric/Behavioral: Negative for depression   Past Medical History  Diagnosis Date  . CHF (congestive heart failure)   . Hypertension   . Back pain, chronic   . Hip pain, chronic   . GERD (gastroesophageal reflux disease)   . Crohn disease   . Diabetes mellitus without complication   . Esophageal stricture     s/p dilation 2003, 2008  . PUD (peptic ulcer disease)   . CKD (chronic kidney disease)   . COPD (chronic obstructive pulmonary disease)   . Arthritis   . AKI (acute kidney injury)   . Sleep apnea    Past Surgical History  Procedure Laterality Date  . Abdominal hysterectomy    . Tonsillectomy    . Cholecystectomy    . Bladder surgery    . Esophagogastroduodenoscopy N/A 07/08/2014    Procedure: ESOPHAGOGASTRODUODENOSCOPY (EGD);  Surgeon: Ladene Artist, MD;  Location[facility] ENDOSCOPY;  Service: Endoscopy;  Laterality: N/A;   Social History:   reports that she has been smoking Cigarettes.  She has a 30 pack-year smoking history. She has never used smokeless tobacco. She reports  that she does not drink alcohol or use illicit drugs.  Family History  Problem Relation Age of Onset  . Diabetes Mellitus II Other     Medications: Patient's Medications  New Prescriptions   No medications on file  Previous Medications   ASPIRIN EC 81 MG TABLET    Take 1 tablet (81 mg total) by mouth daily.   CHOLECALCIFEROL  (VITAMIN D) 2000 UNITS CAPS    Take 1 capsule by mouth daily.   FEEDING SUPPLEMENT, ENSURE ENLIVE, (ENSURE ENLIVE) LIQD    Take 237 mLs by mouth 2 (two) times daily between meals.   FOLIC ACID (FOLVITE) 1 MG TABLET    Take 1 mg by mouth daily.   ISOSORBIDE-HYDRALAZINE (BIDIL) 20-37.5 MG PER TABLET    Take 1 tablet by mouth 3 (three) times daily.   LABETALOL (NORMODYNE) 100 MG TABLET    Take 1 tablet (100 mg total) by mouth 3 (three) times daily.   OLOPATADINE (PATANOL) 0.1 % OPHTHALMIC SOLUTION    Place 1 drop into both eyes 2 (two) times daily.   OMEPRAZOLE (PRILOSEC) 20 MG CAPSULE    Take 1 capsule (20 mg total) by mouth every morning.   ONDANSETRON (ZOFRAN) 4 MG TABLET    Take 1 tablet (4 mg total) by mouth every 6 (six) hours as needed for nausea.   PROAIR HFA 108 (90 BASE) MCG/ACT INHALER    Inhale 1 puff into the lungs every 6 (six) hours as needed. Shortness of breath or wheezing   TRAZODONE (DESYREL) 100 MG TABLET    Take 100 mg by mouth at bedtime.   VITAMIN B-12 1000 MCG TABLET    Take 1 tablet (1,000 mcg total) by mouth daily.  Modified Medications   No medications on file  Discontinued Medications   No medications on file     Physical Exam: Filed Vitals:   07/16/14 1300  BP: 126/69  Pulse: 79  Temp: 97.8 F (36.6 C)  Resp: 18  SpO2: 98%    General- elderly female, well built, in no acute distress Head- normocephalic, atraumatic Nose- no nasal discharge Throat- moist mucus membrane Eyes- PERRLA, EOMI, no pallor, no icterus, no discharge, normal conjunctiva, normal sclera Neck- no cervical lymphadenopathy Cardiovascular- normal s1,s2, no murmurs, palpable dorsalis pedis and radial pulses, trace leg edema Respiratory- bilateral clear to auscultation, no wheeze, no rhonchi, no crackles, no use of accessory muscles Abdomen- bowel sounds present, soft, non tender Musculoskeletal- able to move all 4 extremities, generalized weakness Neurological- alert and oriented to  person and place Skin- warm and dry Psychiatry- flat affect    Labs reviewed: Basic Metabolic Panel:  Recent Labs  07/11/14 0510 07/12/14 0550 07/13/14 0408 07/14/14 0358  NA 139 138 138 138  K 3.1* 3.7 3.8 3.8  CL 110 110 112 113*  CO2 20 17* 17* 17*  GLUCOSE 151* 135* 141* 132*  BUN 13 12 10 7   CREATININE 2.20* 2.28* 2.16* 1.98*  CALCIUM 9.2 9.3 9.8 9.6  MG 1.9 1.9 2.0  --    Liver Function Tests:  Recent Labs  07/11/14 0510 07/12/14 0550 07/13/14 0408  AST 17 15 17   ALT 23 17 16   ALKPHOS 42 40 42  BILITOT 0.5 0.4 0.4  PROT 4.8* 4.8* 5.1*  ALBUMIN 2.4* 2.3* 2.3*    Recent Labs  07/07/14 1100  LIPASE 20   No results for input(s): AMMONIA in the last 8760 hours. CBC:  Recent Labs  06/19/14 1234  06/28/14 0430  07/07/14 1310  07/12/14 0550 07/13/14 0408 07/14/14 0358  WBC 16.1*  < > 5.7  < > 10.4  < > 10.9* 10.6* 10.1  NEUTROABS 13.5*  --  2.3  --  8.9*  --   --   --   --   HGB 12.5  < > 8.6*  < > 10.4*  < > 7.4* 7.9* 7.5*  HCT 35.8*  < > 24.9*  < > 30.3*  < > 21.6* 22.7* 21.5*  MCV 88.0  < > 86.8  < > 87.8  < > 87.8 86.0 86.0  PLT 204  < > 176  < > 323  < > 191 234 234  < > = values in this interval not displayed. Cardiac Enzymes:  Recent Labs  08/28/13 0454 06/19/14 2023  CKTOTAL  --  624*  TROPONINI <0.30  --    BNP: Invalid input(s): POCBNP CBG:  Recent Labs  07/14/14 0742 07/14/14 1151 07/14/14 1637  GLUCAP 130* 154* 134*   Imaging Ct abdomen/pelvis IMPRESSION: 1. Marked thickening of the stomach wall, particularly within the antral region. Considerations include gastritis, peptic ulcer disease. Metastases can have a similar appearance in the setting of known malignancy. The finding would be atypical for primary gastric malignancy. 2. Cardiomegaly and coronary artery disease. 3. Small right renal cyst, stable in appearance. 4. Low-attenuation pelvic structure, favoring right adnexal cyst, 6.4 cm. This is not completely  characterized. Consider further evaluation with pelvic ultrasound. 5. Stable fat containing anterior abdominal wall hernia.   Assessment/Plan  Physical deconditioning Will have her work with physical therapy and occupational therapy team to help with gait training and muscle strengthening exercises.fall precautions. Skin care. Encourage to be out of bed.   Esophagitis, gastritis, duodenitis S/p EGD. On omeprazole 20 mg bid. Change this to pantoprazole 40 mg bid for now and monitor. Avoid NSAIDs  CVA Will have her work with physical therapy and occupational therapy team to help with gait training and muscle strengthening exercises.fall precautions. Skin care. Encourage to be out of bed. Continue aspirin EC 81 mg daily  Nausea and vomiting Her gastritis could be contributing to this along with a component of gastroparesis. On zofran 4 mg q6h prn for now. Add reglan 10 mg tid with meals for now and reassess. Gi referral to dr brodie's office per family request  Anemia  Gastritis related gi bleed and ckd could both be contributing to this. Check erythropoetin level given hx of ckd. If low, will benefit from epo injection to help with her energy level. Check ferritin level and if low, consider iron supplement  Protein calorie malnutrition Poor po intake. Monitor weight weekly. Encourage po intake. Dietary consult to assess for protein supplement. continue ensure for now  Hypertension Stable. Continue Bidil 20-37.5 mg tid and labetalol 100 mg tid for now  Right adnexal mass Pending pelvic ultrasound and outpatient follow up  Acute on ckd Monitor renal function  Diastolic CHF  Continue bidil and b blocker. Monitor clinically  Diabetes mellitus, type II  Off all medication at present, monitor cbg.last a1c 5.3   Goals of care: short term rehabilitation   Labs/tests ordered: cbc, cmp, ferritin, erythropoetin  Family/ staff Communication: reviewed care plan with patient and nursing  supervisor    Blanchie Serve, MD  [facility] [phone] (Monday-Friday 8 am - 5 pm) [phone] (afterhours)

## 2014-07-18 ENCOUNTER — Encounter: Payer: Self-pay | Admitting: Physician Assistant

## 2014-08-01 ENCOUNTER — Non-Acute Institutional Stay (SKILLED_NURSING_FACILITY): Payer: Medicare Other | Admitting: Adult Health

## 2014-08-01 ENCOUNTER — Encounter: Payer: Self-pay | Admitting: Adult Health

## 2014-08-01 DIAGNOSIS — K209 Esophagitis, unspecified without bleeding: Secondary | ICD-10-CM

## 2014-08-01 DIAGNOSIS — E118 Type 2 diabetes mellitus with unspecified complications: Secondary | ICD-10-CM | POA: Diagnosis not present

## 2014-08-01 DIAGNOSIS — I5032 Chronic diastolic (congestive) heart failure: Secondary | ICD-10-CM | POA: Diagnosis not present

## 2014-08-01 DIAGNOSIS — N189 Chronic kidney disease, unspecified: Secondary | ICD-10-CM | POA: Diagnosis not present

## 2014-08-01 DIAGNOSIS — R5381 Other malaise: Secondary | ICD-10-CM | POA: Diagnosis not present

## 2014-08-01 DIAGNOSIS — E46 Unspecified protein-calorie malnutrition: Secondary | ICD-10-CM

## 2014-08-01 DIAGNOSIS — I10 Essential (primary) hypertension: Secondary | ICD-10-CM

## 2014-08-01 DIAGNOSIS — I63339 Cerebral infarction due to thrombosis of unspecified posterior cerebral artery: Secondary | ICD-10-CM | POA: Diagnosis not present

## 2014-08-01 DIAGNOSIS — N179 Acute kidney failure, unspecified: Secondary | ICD-10-CM | POA: Diagnosis not present

## 2014-08-04 ENCOUNTER — Other Ambulatory Visit: Payer: Self-pay | Admitting: *Deleted

## 2014-08-04 ENCOUNTER — Ambulatory Visit (INDEPENDENT_AMBULATORY_CARE_PROVIDER_SITE_OTHER): Payer: Medicare Other | Admitting: Physician Assistant

## 2014-08-04 ENCOUNTER — Encounter: Payer: Self-pay | Admitting: Physician Assistant

## 2014-08-04 VITALS — BP 130/80 | HR 68 | Wt 157.2 lb

## 2014-08-04 DIAGNOSIS — K5090 Crohn's disease, unspecified, without complications: Secondary | ICD-10-CM | POA: Diagnosis not present

## 2014-08-04 DIAGNOSIS — K2990 Gastroduodenitis, unspecified, without bleeding: Secondary | ICD-10-CM | POA: Diagnosis not present

## 2014-08-04 DIAGNOSIS — K2970 Gastritis, unspecified, without bleeding: Secondary | ICD-10-CM | POA: Diagnosis not present

## 2014-08-04 MED ORDER — PANTOPRAZOLE SODIUM 40 MG PO TBEC: 40.0000 mg | DELAYED_RELEASE_TABLET | Freq: Two times a day (BID) | ORAL | Status: DC

## 2014-08-04 MED ORDER — METOCLOPRAMIDE HCL 10 MG PO TABS: 10.0000 mg | ORAL_TABLET | Freq: Three times a day (TID) | ORAL | Status: DC | PRN

## 2014-08-04 NOTE — Progress Notes (Signed)
Reviewed and agree.  I wonder if she was given an option to follow up with her GI MD at [facility].

## 2014-08-04 NOTE — Progress Notes (Signed)
Patient ID: Michelle Farrell, female   DOB: 1940-09-02, 74 y.o.   MRN: [id number]     History of Present Illness: Michelle Farrell is a delightful 74 year old female with a history of unspecified heart failure, diabetes, chronic hip and back pain, hypertension, and well controlled Crohn's disease. She had previously been on these a call and then for repeat her he of sulfasalazine but reports she has been off of that for some time now. She has had admissions in February and March of this year for symptoms of gastroenteritis including nausea, vomiting, and diarrhea. She was admitted to the hospital on March 24 with fever and altered mental status and sepsis. She was found to have a UTI and a CVA. She was evaluated by neurology and infectious disease, stabilized and was transferred to [facility] inpatient rehabilitation on 06/27/2014 on vancomycin and ceftriaxone. On April 8 she was found to have acute renal failure and a vancomycin trough level of 47. She was also noted to be bradycardic. She was started on IV fluids, her Ace inhibitor and spironolactone were discontinued and she had improvement of her kidney function. For her bradycardia her clonidine was discontinued and she was started on hydralazine. On April 10 she developed epigastric pain with intractable nausea and vomiting and was noted to have had bilious and bloody emesis. She was readmitted to the hospital for further evaluation. She underwent an EGD on April 12 which demonstrated esophagitis, gastritis and duodenitis. She was advised to continue a twice a day PPI. She was discharged on April 18 2 rehabilitation facility. She went home to live with her granddaughter this past Saturday. She feels well and has no nausea. Her appetite has been good and she has been eating well. She is using her pantoprazole and juices her Reglan as needed. Her granddaughter states the patient is eating fairly well, she is no longer using Ensure because she doesn't like the  taste.   Past Medical History  Diagnosis Date  . CHF (congestive heart failure)   . Hypertension   . Back pain, chronic   . Hip pain, chronic   . GERD (gastroesophageal reflux disease)   . Crohn disease   . Diabetes mellitus without complication   . Esophageal stricture     s/p dilation 2003, 2008  . PUD (peptic ulcer disease)   . CKD (chronic kidney disease)   . COPD (chronic obstructive pulmonary disease)   . Arthritis   . AKI (acute kidney injury)   . Sleep apnea     Past Surgical History  Procedure Laterality Date  . Abdominal hysterectomy    . Tonsillectomy    . Cholecystectomy    . Bladder surgery    . Esophagogastroduodenoscopy N/A 07/08/2014    Procedure: ESOPHAGOGASTRODUODENOSCOPY (EGD);  Surgeon: Ladene Artist, MD;  Location: [facility] ENDOSCOPY;  Service: Endoscopy;  Laterality: N/A;   Family History  Problem Relation Age of Onset  . Diabetes Mellitus II Other    History  Substance Use Topics  . Smoking status: Current Every Day Smoker -- 1.00 packs/day for 30 years    Types: Cigarettes  . Smokeless tobacco: Never Used  . Alcohol Use: No   Current Outpatient Prescriptions  Medication Sig Dispense Refill  . aspirin EC 81 MG tablet Take 1 tablet (81 mg total) by mouth daily.    . Cholecalciferol (VITAMIN D) 2000 UNITS CAPS Take 1 capsule by mouth daily.    . feeding supplement, ENSURE ENLIVE, (ENSURE ENLIVE) LIQD Take 237 mLs by  mouth 2 (two) times daily between meals. 378 mL 12  . folic acid (FOLVITE) 1 MG tablet Take 1 mg by mouth daily.    . isosorbide-hydrALAZINE (BIDIL) 20-37.5 MG per tablet Take 1 tablet by mouth 3 (three) times daily. 90 tablet 1  . labetalol (NORMODYNE) 100 MG tablet Take 1 tablet (100 mg total) by mouth 3 (three) times daily. 90 tablet 1  . olopatadine (PATANOL) 0.1 % ophthalmic solution Place 1 drop into both eyes 2 (two) times daily.    Marland Kitchen omeprazole (PRILOSEC) 20 MG capsule Take 1 capsule (20 mg total) by mouth every morning. 60  capsule 1  . ondansetron (ZOFRAN) 4 MG tablet Take 1 tablet (4 mg total) by mouth every 6 (six) hours as needed for nausea. 20 tablet 0  . PROAIR HFA 108 (90 BASE) MCG/ACT inhaler Inhale 1 puff into the lungs every 6 (six) hours as needed. Shortness of breath or wheezing  3  . traZODone (DESYREL) 100 MG tablet Take 100 mg by mouth at bedtime.    . vitamin B-12 1000 MCG tablet Take 1 tablet (1,000 mcg total) by mouth daily. 30 tablet 1  . metoCLOPramide (REGLAN) 10 MG tablet Take 1 tablet (10 mg total) by mouth 3 (three) times daily as needed for nausea. 90 tablet 1  . pantoprazole (PROTONIX) 40 MG tablet Take 1 tablet (40 mg total) by mouth 2 (two) times daily. 30 min prior to breakfast and 30 min prior to supper 90 tablet 3   No current facility-administered medications for this visit.   Facility-Administered Medications Ordered in Other Visits  Medication Dose Route Frequency Provider Last Rate Last Dose  . sodium chloride 0.9 % injection 10-40 mL  10-40 mL Intracatheter PRN Ladene Artist, MD   10 mL at 07/09/14 0501   No Known Allergies    Review of Systems: Per history of present illness, otherwise negative   Physical Exam: General: Pleasant, well developed female in no acute distress Head: Normocephalic and atraumatic Eyes:  sclerae anicteric, conjunctiva pink  Ears: Normal auditory acuity Lungs: Clear throughout to auscultation Heart: Regular rate and rhythm 2/6 murmur Abdomen: Soft, non distended, non-tender. No masses, no hepatomegaly. Normal bowel sounds Musculoskeletal: Symmetrical with no gross deformities  Extremities: No edema  Neurological: Alert oriented x 4, grossly nonfocal Psychological:  Alert and cooperative. Normal mood and affect  Assessment and Recommendations: 74 year old female status post admission for hematemesis, status post EGD at which time she was found to have esophagitis, gastritis, and duodenitis. She is feeling better and is eating well. She  has been instructed to continue her twice daily pantoprazole indefinitely. She will also continue to use Reglan 10 mg 3 times daily as needed. Her Crohn's appears to be well controlled. Patient has signed a medical release to obtain a copy of her colonoscopy report from [facility] gastroenterology. Patient states she had a colonoscopy there in 2015. Patient will follow-up here in 3 months sooner as needed needed.   Arlyss Weathersby, Michelle Barley PA-C 08/04/2014,   Cc: Daylene Posey, MD

## 2014-08-04 NOTE — Patient Instructions (Signed)
We have sent the following medications to your pharmacy for you to pick up at your convenience: Pantoprazole and reglan  Please follow up with Dr. Olevia Perches in 3 months

## 2014-08-07 NOTE — Progress Notes (Signed)
Reviewed and agree. Does she need to go back to [facility]?

## 2014-08-08 NOTE — Progress Notes (Signed)
Patient ID: Michelle Farrell, female   DOB: Mar 07, 1941, 74 y.o.   MRN: [id number]   08/01/14  Facility:  Nursing Home Location:  [place] Room Number: 1005-2 LEVEL OF CARE:  SNF (31)   Chief Complaint  Patient presents with  . Discharge Note    Physical deconditioning, hypertension, acute renal failure, pelvic mass, diastolic CHF, anemia, diabetes mellitus, bacterial meningitis, esophagitis and CVA    HISTORY OF PRESENT ILLNESS:   This is a 74 year old female who is for discharge home with Home health PT for ambulation and lower extremity strengthening, OT for self care skills, Nursing for disease management and ST for  Safety/effective swallowing food and safety awareness. She has been admitted to [facility] on 07/14/14 from [facility]. She has PMH of heart failure, diabetes mellitus, chronic hip/bac/leg pain, hypertension and well-controlled Crohn's disease. She was recently, 06/19/14, admitted to the hospital for UTI and CVA. She was then transferred to [facility] Inpatient rehabilitation on 4/1 with vancomycin and ceftriaxone. On 4/8, she had acute renal failure and a vancomycin trough level of 47. IV fluids were given and Ace and spironolactone were discontinued. She was bradycardic so her clonidine was discontinued and started on hydralazine. On 4/10, she developed epigastric pain, intractable nausea and vomiting and bloody emesis so she was readmitted to hospital for further evaluation.  EGD done in the hospital showed esophagitis, gastritis and duodenitis. Workup shows that her fever may be more of a drug reaction to vancomycin and renal failure. She was hydrated and her creatinine has trended down to 1.98  And with persistent decreased GFR.  Patient was admitted to this facility for short-term rehabilitation after the patient's recent hospitalization.  Patient has completed SNF rehabilitation and therapy has cleared the patient for discharge.  PAST  MEDICAL HISTORY:  Past Medical History  Diagnosis Date  . CHF (congestive heart failure)   . Hypertension   . Back pain, chronic   . Hip pain, chronic   . GERD (gastroesophageal reflux disease)   . Crohn disease   . Diabetes mellitus without complication   . Esophageal stricture     s/p dilation 2003, 2008  . PUD (peptic ulcer disease)   . CKD (chronic kidney disease)   . COPD (chronic obstructive pulmonary disease)   . Arthritis   . AKI (acute kidney injury)   . Sleep apnea     CURRENT MEDICATIONS: Reviewed per MAR/see medication list  No Known Allergies   REVIEW OF SYSTEMS:  GENERAL: no change in appetite, no fatigue, no weight changes, no fever, chills or weakness RESPIRATORY: no cough, SOB, DOE, wheezing, hemoptysis CARDIAC: no chest pain, or palpitations GI: no abdominal pain, diarrhea, constipation, heart burn, nausea or vomiting  PHYSICAL EXAMINATION  GENERAL: no acute distress, normal body habitus EYES: conjunctivae normal, sclerae normal, normal eye lids NECK: supple, trachea midline, no neck masses, no thyroid tenderness, no thyromegaly LYMPHATICS: no LAN in the neck, no supraclavicular LAN RESPIRATORY: breathing is even & unlabored, BS CTAB CARDIAC: RRR, 3-6 systolic murmur on the right upper sternal border,no extra heart sounds, BLE edema 1+ GI: abdomen soft, normal BS, no masses, no tenderness, no hepatomegaly, no splenomegaly EXTREMITIES: able to move X 4 extremities PSYCHIATRIC: the patient is alert & oriented to person, affect & behavior appropriate  LABS/RADIOLOGY: Labs reviewed: 07/17/14  transabdominal pelvic ultrasound shows bilateral ovarian nonvisualization 07/16/14  WBC 9.4 hemoglobin 9.2 hematocrit 27.6 MCV 90.2 sodium 135 potassium 4.3 glucose 160 BUN  17 creatinine 1.69 calcium 9.5 Basic Metabolic Panel:  Recent Labs  07/11/14 0510 07/12/14 0550 07/13/14 0408 07/14/14 0358  NA 139 138 138 138  K 3.1* 3.7 3.8 3.8  CL 110 110 112 113*    CO2 20 17* 17* 17*  GLUCOSE 151* 135* 141* 132*  BUN 13 12 10 7   CREATININE 2.20* 2.28* 2.16* 1.98*  CALCIUM 9.2 9.3 9.8 9.6  MG 1.9 1.9 2.0  --    Liver Function Tests:  Recent Labs  07/11/14 0510 07/12/14 0550 07/13/14 0408  AST 17 15 17   ALT 23 17 16   ALKPHOS 42 40 42  BILITOT 0.5 0.4 0.4  PROT 4.8* 4.8* 5.1*  ALBUMIN 2.4* 2.3* 2.3*    Recent Labs  07/07/14 1100  LIPASE 20   CBC:  Recent Labs  06/19/14 1234  06/28/14 0430  07/07/14 1310  07/12/14 0550 07/13/14 0408 07/14/14 0358  WBC 16.1*  < > 5.7  < > 10.4  < > 10.9* 10.6* 10.1  NEUTROABS 13.5*  --  2.3  --  8.9*  --   --   --   --   HGB 12.5  < > 8.6*  < > 10.4*  < > 7.4* 7.9* 7.5*  HCT 35.8*  < > 24.9*  < > 30.3*  < > 21.6* 22.7* 21.5*  MCV 88.0  < > 86.8  < > 87.8  < > 87.8 86.0 86.0  PLT 204  < > 176  < > 323  < > 191 234 234  < > = values in this interval not displayed.  Lipid Panel:  Recent Labs  06/21/14 0550  HDL 27*   Cardiac Enzymes:  Recent Labs  08/28/13 0454 06/19/14 2023  CKTOTAL  --  624*  TROPONINI <0.30  --    CBG:  Recent Labs  07/14/14 0742 07/14/14 1151 07/14/14 1637  GLUCAP 130* 154* 134*    No results found.  ASSESSMENT/PLAN:  Physical deconditioning - for home health PT, OT, ST and nursing  Bacterial meningitis - Resolved Hypertension -  Continue Bidil 20-37.5 mg 1 tab by mouth 3 times a day and labetalol 100 mg by mouth 3 times a day Esophagitis - S/P EGD; increase omeprazole to 20 mg by mouth twice a day CVA - continue aspirin 81 mg by mouth daily Pelvic mass - follow-up with GYN, granddaughter will make the appointment Anemia of chronic kidney disease - hemoglobin 9.2; stable Acute renal failure - creatinine 1.69; trending down Diastolic CHF - stable Diabetes mellitus, type II - hgbA1c 5.3; continue CBG before meals and at bedtim Protein calorie malnutrition - albumin 2.3; continue supplementation    I have filled out patient's discharge  paperwork and written prescriptions.  Patient will receive home health PT, OT, ST and Nursing.  Total discharge time: Greater than 30 minutes  Discharge time involved coordination of the discharge process with social worker, nursing staff and therapy department. Medical justification for home health services verified.     [facility], NP [employer] [phone]

## 2014-08-26 ENCOUNTER — Other Ambulatory Visit: Payer: Self-pay | Admitting: Adult Health

## 2014-09-12 ENCOUNTER — Encounter (HOSPITAL_COMMUNITY): Payer: Self-pay | Admitting: Family Medicine

## 2014-09-12 ENCOUNTER — Emergency Department (HOSPITAL_COMMUNITY): Payer: Medicare Other

## 2014-09-12 ENCOUNTER — Observation Stay (HOSPITAL_COMMUNITY): Payer: Medicare Other

## 2014-09-12 ENCOUNTER — Observation Stay (HOSPITAL_COMMUNITY)
Admission: EM | Admit: 2014-09-12 | Discharge: 2014-09-15 | Disposition: A | Discharge status: Home Health | Payer: Medicare Other | Attending: Family Medicine | Admitting: Family Medicine

## 2014-09-12 DIAGNOSIS — G4733 Obstructive sleep apnea (adult) (pediatric): Secondary | ICD-10-CM | POA: Diagnosis not present

## 2014-09-12 DIAGNOSIS — E1122 Type 2 diabetes mellitus with diabetic chronic kidney disease: Secondary | ICD-10-CM | POA: Insufficient documentation

## 2014-09-12 DIAGNOSIS — I69398 Other sequelae of cerebral infarction: Secondary | ICD-10-CM | POA: Diagnosis not present

## 2014-09-12 DIAGNOSIS — D631 Anemia in chronic kidney disease: Secondary | ICD-10-CM | POA: Insufficient documentation

## 2014-09-12 DIAGNOSIS — R569 Unspecified convulsions: Secondary | ICD-10-CM | POA: Diagnosis not present

## 2014-09-12 DIAGNOSIS — F17210 Nicotine dependence, cigarettes, uncomplicated: Secondary | ICD-10-CM | POA: Insufficient documentation

## 2014-09-12 DIAGNOSIS — K209 Esophagitis, unspecified without bleeding: Secondary | ICD-10-CM | POA: Diagnosis present

## 2014-09-12 DIAGNOSIS — K5090 Crohn's disease, unspecified, without complications: Secondary | ICD-10-CM | POA: Diagnosis present

## 2014-09-12 DIAGNOSIS — E876 Hypokalemia: Secondary | ICD-10-CM | POA: Diagnosis present

## 2014-09-12 DIAGNOSIS — G40909 Epilepsy, unspecified, not intractable, without status epilepticus: Secondary | ICD-10-CM

## 2014-09-12 DIAGNOSIS — E1140 Type 2 diabetes mellitus with diabetic neuropathy, unspecified: Secondary | ICD-10-CM | POA: Diagnosis not present

## 2014-09-12 DIAGNOSIS — R001 Bradycardia, unspecified: Secondary | ICD-10-CM | POA: Diagnosis not present

## 2014-09-12 DIAGNOSIS — N189 Chronic kidney disease, unspecified: Secondary | ICD-10-CM | POA: Insufficient documentation

## 2014-09-12 DIAGNOSIS — Z79899 Other long term (current) drug therapy: Secondary | ICD-10-CM | POA: Insufficient documentation

## 2014-09-12 DIAGNOSIS — R404 Transient alteration of awareness: Secondary | ICD-10-CM | POA: Diagnosis present

## 2014-09-12 DIAGNOSIS — E119 Type 2 diabetes mellitus without complications: Secondary | ICD-10-CM

## 2014-09-12 DIAGNOSIS — M549 Dorsalgia, unspecified: Secondary | ICD-10-CM | POA: Insufficient documentation

## 2014-09-12 DIAGNOSIS — E785 Hyperlipidemia, unspecified: Secondary | ICD-10-CM | POA: Diagnosis present

## 2014-09-12 DIAGNOSIS — I5030 Unspecified diastolic (congestive) heart failure: Secondary | ICD-10-CM | POA: Diagnosis present

## 2014-09-12 DIAGNOSIS — R260 Ataxic gait: Secondary | ICD-10-CM | POA: Insufficient documentation

## 2014-09-12 DIAGNOSIS — I5032 Chronic diastolic (congestive) heart failure: Secondary | ICD-10-CM | POA: Insufficient documentation

## 2014-09-12 DIAGNOSIS — G8929 Other chronic pain: Secondary | ICD-10-CM | POA: Insufficient documentation

## 2014-09-12 DIAGNOSIS — I6340 Cerebral infarction due to embolism of unspecified cerebral artery: Secondary | ICD-10-CM | POA: Diagnosis present

## 2014-09-12 DIAGNOSIS — J449 Chronic obstructive pulmonary disease, unspecified: Secondary | ICD-10-CM | POA: Diagnosis not present

## 2014-09-12 DIAGNOSIS — Z7982 Long term (current) use of aspirin: Secondary | ICD-10-CM | POA: Insufficient documentation

## 2014-09-12 DIAGNOSIS — G9389 Other specified disorders of brain: Secondary | ICD-10-CM | POA: Diagnosis not present

## 2014-09-12 DIAGNOSIS — K219 Gastro-esophageal reflux disease without esophagitis: Secondary | ICD-10-CM | POA: Diagnosis not present

## 2014-09-12 DIAGNOSIS — I129 Hypertensive chronic kidney disease with stage 1 through stage 4 chronic kidney disease, or unspecified chronic kidney disease: Secondary | ICD-10-CM | POA: Insufficient documentation

## 2014-09-12 DIAGNOSIS — R4182 Altered mental status, unspecified: Secondary | ICD-10-CM | POA: Diagnosis present

## 2014-09-12 DIAGNOSIS — I10 Essential (primary) hypertension: Secondary | ICD-10-CM | POA: Diagnosis present

## 2014-09-12 DIAGNOSIS — D649 Anemia, unspecified: Secondary | ICD-10-CM | POA: Diagnosis present

## 2014-09-12 DIAGNOSIS — G9340 Encephalopathy, unspecified: Principal | ICD-10-CM | POA: Diagnosis present

## 2014-09-12 LAB — COMPREHENSIVE METABOLIC PANEL
ALK PHOS: 53 U/L (ref 38–126)
ALT: 9 U/L — AB (ref 14–54)
AST: 15 U/L (ref 15–41)
Albumin: 3.3 g/dL — ABNORMAL LOW (ref 3.5–5.0)
Anion gap: 9 (ref 5–15)
BUN: 6 mg/dL (ref 6–20)
CO2: 24 mmol/L (ref 22–32)
Calcium: 10 mg/dL (ref 8.9–10.3)
Chloride: 106 mmol/L (ref 101–111)
Creatinine, Ser: 1.35 mg/dL — ABNORMAL HIGH (ref 0.44–1.00)
GFR calc non Af Amer: 38 mL/min — ABNORMAL LOW (ref >60)
GFR, EST AFRICAN AMERICAN: 44 mL/min — AB (ref >60)
GLUCOSE: 142 mg/dL — AB (ref 65–99)
POTASSIUM: 3 mmol/L — AB (ref 3.5–5.1)
SODIUM: 139 mmol/L (ref 135–145)
TOTAL PROTEIN: 6.3 g/dL — AB (ref 6.5–8.1)
Total Bilirubin: 0.4 mg/dL (ref 0.3–1.2)

## 2014-09-12 LAB — DIFFERENTIAL
Basophils Absolute: 0 10*3/uL (ref 0.0–0.1)
Basophils Relative: 0 % (ref 0–1)
EOS ABS: 0.5 10*3/uL (ref 0.0–0.7)
EOS PCT: 6 % — AB (ref 0–5)
LYMPHS PCT: 21 % (ref 12–46)
Lymphs Abs: 1.8 10*3/uL (ref 0.7–4.0)
Monocytes Absolute: 0.8 10*3/uL (ref 0.1–1.0)
Monocytes Relative: 9 % (ref 3–12)
Neutro Abs: 5.6 10*3/uL (ref 1.7–7.7)
Neutrophils Relative %: 64 % (ref 43–77)

## 2014-09-12 LAB — I-STAT CHEM 8, ED
BUN: 6 mg/dL (ref 6–20)
CHLORIDE: 104 mmol/L (ref 101–111)
CREATININE: 1.3 mg/dL — AB (ref 0.44–1.00)
Calcium, Ion: 1.3 mmol/L (ref 1.13–1.30)
GLUCOSE: 142 mg/dL — AB (ref 65–99)
HCT: 30 % — ABNORMAL LOW (ref 36.0–46.0)
Hemoglobin: 10.2 g/dL — ABNORMAL LOW (ref 12.0–15.0)
POTASSIUM: 3 mmol/L — AB (ref 3.5–5.1)
SODIUM: 139 mmol/L (ref 135–145)
TCO2: 22 mmol/L (ref 0–100)

## 2014-09-12 LAB — RAPID URINE DRUG SCREEN, HOSP PERFORMED
AMPHETAMINES: NOT DETECTED
BENZODIAZEPINES: NOT DETECTED
Barbiturates: NOT DETECTED
COCAINE: NOT DETECTED
Opiates: NOT DETECTED
TETRAHYDROCANNABINOL: NOT DETECTED

## 2014-09-12 LAB — URINALYSIS, ROUTINE W REFLEX MICROSCOPIC
Bilirubin Urine: NEGATIVE
GLUCOSE, UA: NEGATIVE mg/dL
Hgb urine dipstick: NEGATIVE
Ketones, ur: NEGATIVE mg/dL
Leukocytes, UA: NEGATIVE
NITRITE: NEGATIVE
Protein, ur: NEGATIVE mg/dL
Specific Gravity, Urine: 1.006 (ref 1.005–1.030)
UROBILINOGEN UA: 0.2 mg/dL (ref 0.0–1.0)
pH: 6 (ref 5.0–8.0)

## 2014-09-12 LAB — I-STAT TROPONIN, ED: TROPONIN I, POC: 0.01 ng/mL (ref 0.00–0.08)

## 2014-09-12 LAB — CBC
HCT: 27.1 % — ABNORMAL LOW (ref 36.0–46.0)
Hemoglobin: 9.3 g/dL — ABNORMAL LOW (ref 12.0–15.0)
MCH: 28.8 pg (ref 26.0–34.0)
MCHC: 34.3 g/dL (ref 30.0–36.0)
MCV: 83.9 fL (ref 78.0–100.0)
PLATELETS: 251 10*3/uL (ref 150–400)
RBC: 3.23 MIL/uL — ABNORMAL LOW (ref 3.87–5.11)
RDW: 16 % — ABNORMAL HIGH (ref 11.5–15.5)
WBC: 8.7 10*3/uL (ref 4.0–10.5)

## 2014-09-12 LAB — APTT: APTT: 30 s (ref 24–37)

## 2014-09-12 LAB — PROTIME-INR
INR: 1.06 (ref 0.00–1.49)
Prothrombin Time: 14 seconds (ref 11.6–15.2)

## 2014-09-12 LAB — CBG MONITORING, ED: GLUCOSE-CAPILLARY: 152 mg/dL — AB (ref 65–99)

## 2014-09-12 MED ORDER — HYDRALAZINE HCL 20 MG/ML IJ SOLN
5.0000 mg | Freq: Four times a day (QID) | INTRAMUSCULAR | Status: DC | PRN
  Administered 2014-09-12 – 2014-09-13 (×2): 5 mg via INTRAVENOUS
  Filled 2014-09-12 (×2): qty 1

## 2014-09-12 MED ORDER — VITAMIN B-12 1000 MCG PO TABS
1000.0000 ug | ORAL_TABLET | Freq: Every day | ORAL | Status: DC
  Administered 2014-09-14 – 2014-09-15 (×2): 1000 ug via ORAL
  Filled 2014-09-12 (×2): qty 1

## 2014-09-12 MED ORDER — ACETAMINOPHEN 650 MG RE SUPP: 650.0000 mg | Freq: Four times a day (QID) | RECTAL | Status: DC | PRN

## 2014-09-12 MED ORDER — NALOXONE HCL 0.4 MG/ML IJ SOLN
INTRAMUSCULAR | Status: AC
  Filled 2014-09-12: qty 1

## 2014-09-12 MED ORDER — ACETAMINOPHEN 325 MG PO TABS
650.0000 mg | ORAL_TABLET | Freq: Four times a day (QID) | ORAL | Status: DC | PRN
  Administered 2014-09-14: 650 mg via ORAL
  Filled 2014-09-12: qty 2

## 2014-09-12 MED ORDER — SODIUM CHLORIDE 0.9 % IJ SOLN
3.0000 mL | Freq: Two times a day (BID) | INTRAMUSCULAR | Status: DC
  Administered 2014-09-13 – 2014-09-14 (×4): 3 mL via INTRAVENOUS

## 2014-09-12 MED ORDER — METOCLOPRAMIDE HCL 10 MG PO TABS
10.0000 mg | ORAL_TABLET | Freq: Three times a day (TID) | ORAL | Status: DC | PRN
  Administered 2014-09-15: 10 mg via ORAL
  Filled 2014-09-12: qty 1

## 2014-09-12 MED ORDER — FOLIC ACID 1 MG PO TABS
1.0000 mg | ORAL_TABLET | Freq: Every day | ORAL | Status: DC
Start: 2014-09-12 — End: 2014-09-15
  Administered 2014-09-14 – 2014-09-15 (×2): 1 mg via ORAL
  Filled 2014-09-12 (×2): qty 1

## 2014-09-12 MED ORDER — ONDANSETRON HCL 4 MG/2ML IJ SOLN: 4.0000 mg | Freq: Three times a day (TID) | INTRAMUSCULAR | Status: DC | PRN

## 2014-09-12 MED ORDER — PANTOPRAZOLE SODIUM 40 MG PO TBEC
40.0000 mg | DELAYED_RELEASE_TABLET | Freq: Two times a day (BID) | ORAL | Status: DC
  Administered 2014-09-13 – 2014-09-15 (×4): 40 mg via ORAL
  Filled 2014-09-12 (×4): qty 1

## 2014-09-12 MED ORDER — ASPIRIN EC 81 MG PO TBEC
81.0000 mg | DELAYED_RELEASE_TABLET | Freq: Every day | ORAL | Status: DC
  Administered 2014-09-13: 81 mg via ORAL
  Filled 2014-09-12: qty 1

## 2014-09-12 MED ORDER — ISOSORB DINITRATE-HYDRALAZINE 20-37.5 MG PO TABS
1.0000 | ORAL_TABLET | Freq: Three times a day (TID) | ORAL | Status: DC
  Administered 2014-09-13 – 2014-09-15 (×7): 1 via ORAL
  Filled 2014-09-12 (×11): qty 1

## 2014-09-12 MED ORDER — NALOXONE HCL 0.4 MG/ML IJ SOLN
0.4000 mg | Freq: Once | INTRAMUSCULAR | Status: AC
  Administered 2014-09-12: 0.4 mg via INTRAVENOUS

## 2014-09-12 MED ORDER — VITAMIN D 1000 UNITS PO TABS
2000.0000 [IU] | ORAL_TABLET | Freq: Every day | ORAL | Status: DC
  Administered 2014-09-14 – 2014-09-15 (×2): 2000 [IU] via ORAL
  Filled 2014-09-12 (×2): qty 2

## 2014-09-12 MED ORDER — HEPARIN SODIUM (PORCINE) 5000 UNIT/ML IJ SOLN
5000.0000 [IU] | Freq: Three times a day (TID) | INTRAMUSCULAR | Status: DC
  Administered 2014-09-12 – 2014-09-15 (×8): 5000 [IU] via SUBCUTANEOUS
  Filled 2014-09-12 (×8): qty 1

## 2014-09-12 MED ORDER — VITAMIN D 50 MCG (2000 UT) PO CAPS: 1.0000 | ORAL_CAPSULE | Freq: Every day | ORAL | Status: DC

## 2014-09-12 MED ORDER — TRAZODONE HCL 100 MG PO TABS
100.0000 mg | ORAL_TABLET | Freq: Every day | ORAL | Status: DC
  Administered 2014-09-13 – 2014-09-14 (×2): 100 mg via ORAL
  Filled 2014-09-12 (×2): qty 1

## 2014-09-12 MED ORDER — SODIUM CHLORIDE 0.9 % IV SOLN
1000.0000 mg | Freq: Once | INTRAVENOUS | Status: AC
  Administered 2014-09-12: 1000 mg via INTRAVENOUS
  Filled 2014-09-12: qty 10

## 2014-09-12 MED ORDER — LABETALOL HCL 100 MG PO TABS: 100.0000 mg | ORAL_TABLET | Freq: Three times a day (TID) | ORAL | Status: DC

## 2014-09-12 MED ORDER — LEVETIRACETAM 500 MG PO TABS: 500.0000 mg | ORAL_TABLET | Freq: Two times a day (BID) | ORAL | Status: DC

## 2014-09-12 MED ORDER — HYDRALAZINE HCL 20 MG/ML IJ SOLN
10.0000 mg | INTRAMUSCULAR | Status: AC
  Administered 2014-09-12: 10 mg via INTRAVENOUS
  Filled 2014-09-12: qty 1

## 2014-09-12 MED ORDER — FERROUS SULFATE 325 (65 FE) MG PO TABS
325.0000 mg | ORAL_TABLET | Freq: Every day | ORAL | Status: DC
  Administered 2014-09-14 – 2014-09-15 (×2): 325 mg via ORAL
  Filled 2014-09-12 (×2): qty 1

## 2014-09-12 NOTE — Progress Notes (Signed)
Patient still in potential post ictal state. Patient arouses to voice, falls back asleep quickly. Will attempt orthostatic vitals when patient is more alert.

## 2014-09-12 NOTE — Consult Note (Signed)
Neurology Consultation Reason for Consult: Seizure Referring Physician: Andria Frames, W  CC: Seizure  History is obtained from: daughter  HPI: Michelle Farrell is a 74 y.o. female who presents with new seizures. She presented in march with altered mental status and had DWI change in the left thalamus which was fel t to most likely represent acute infarct. She had PLEDs on EEG which were felt to indicate structural injury(from stroke) rather than epileptogenic zone and keppra was making her sedated(though was on a high dose given renal failure a the time).   She was seen this morning to be altered by her daughter who then noticed right facial twicthing. She was brought to the ER where she was improving. She then had another episode of right facial twitching and right arm stiffening which lasted for a few minutes. She currently is improving, and no longer twitching, but remoains confused.    ROS:  Unable to obtain due to altered mental status.   Past Medical History  Diagnosis Date  . CHF (congestive heart failure)   . Hypertension   . Back pain, chronic   . Hip pain, chronic   . GERD (gastroesophageal reflux disease)   . Crohn disease   . Diabetes mellitus without complication   . Esophageal stricture     s/p dilation 2003, 2008  . PUD (peptic ulcer disease)   . CKD (chronic kidney disease)   . COPD (chronic obstructive pulmonary disease)   . Arthritis   . AKI (acute kidney injury)   . Sleep apnea     Family History: No hx seizure  Social History: Tob: denies  Exam: Current vital signs: BP 196/57 mmHg  Pulse 58  Temp(Src) 97.7 F (36.5 C) (Axillary)  Resp 20  SpO2 96% Vital signs in last 24 hours: Temp:  [97.7 F (36.5 C)-98 F (36.7 C)] 97.7 F (36.5 C) (06/17 1249) Pulse Rate:  [46-58] 58 (06/17 1249) Resp:  [10-22] 20 (06/17 1249) BP: (155-203)/(46-66) 196/57 mmHg (06/17 1249) SpO2:  [80 %-100 %] 96 % (06/17 1249)  Physical Exam  Constitutional: Appears  well-developed and well-nourished.  Psych: sleepy, does not participate Eyes: No scleral injection HENT: No OP obstrucion Head: Normocephalic.  Cardiovascular: Normal rate and regular rhythm.  Respiratory: Effort normal  GI: Soft.  No distension. There is no tenderness.  Skin: WDI  Neuro: Mental Status: Patient is awake, alert, follows commands answers "one of the ladies" when asked who her daughter is.  No signs of aphasia or neglect Cranial Nerves: II: blinks to threat bilaterally. Pupils are equal, round, and reactive to light.   III,IV, VI: EOMI without ptosis or diploplia.  V: Facial sensation is symmetric to temperature VII: Facial movement is symmetric.  VIII: hearing is intact to voice X: Uvula elevates symmetrically XI: Shoulder shrug is symmetric. XII: tongue is midline without atrophy or fasciculations.  Motor: Tone is normal. Bulk is normal. MAEW Sensory: She responds to noxious stimuli bilaterally.  Plantars: Toes are downgoing bilaterally.  Cerebellar:   I have reviewed labs in epic and the results pertinent to this consultation are: GFR 44  I have reviewed the images obtained: CT head - no acute findings  Impression: 74 yo F with previous occipital insult(likely stroke) who presented with AMS in March and was found to have diffusion change in the left thalamus. This finding is usually stroke, but could be seen as an ictal/postictal finding as well. A repeat MRI could be helpful to determine this. An EEG with pleds(as  hers previously had) can suggest a seizure predisposition as well.   She was sedated previously on 541m BID of keppra, but had a more highly elevated creatinine at that time.   Recommendations: 1) Keppra 5044mBID 2) MRI Brain   McRoland RackMD [facility] [phone] 7pm- 7am, please page neurology on call as listed in [facility]

## 2014-09-12 NOTE — Progress Notes (Signed)
Patient with Right facial twitching shortly after arrival from ED.  Granddaughter at bedside witnessed event.  Currently appears postictal.  Resident at bedside to assess patient.   Dr Leonel Ramsay at bedside to consult.  Per RN patient with HR 30s during event.  HR currently 50s.  BP 196/57  SR  RR 20  O2 sat 96%.  Temp 97.7 RN to call if assistance needed.

## 2014-09-12 NOTE — Progress Notes (Signed)
Hydralazine given, patient's blood pressure still elevated >006 systolic. MD contacted, aware. Per MD, continue with current order for hydralazine q6 PRN systolic >349, No further interventions at this time.

## 2014-09-12 NOTE — Progress Notes (Signed)
SLP Cancellation Note  Patient Details Name: Michelle Farrell MRN: [id number] DOB: [birth year]   Cancelled treatment:       Reason Eval/Treat Not Completed: Fatigue/lethargy limiting ability to participate. Per RN patient remains drowsy in postictal state. Will f/u in am 6/18.   [place], CCC-SLP [phone]     Gabriel Rainwater Meryl 09/12/2014, 2:46 PM

## 2014-09-12 NOTE — ED Provider Notes (Signed)
CSN: [id number]     Arrival date & time 09/12/14  0841 History   First MD Initiated Contact with Patient 09/12/14 [phone]     Chief Complaint  Patient presents with  . Altered Mental Status     (Consider location/radiation/quality/duration/timing/severity/associated sxs/prior Treatment) HPI Comments: The patient is a 74 year old female, she has congestive heart failure, chronic back pain, Crohn's disease, diabetes currently not treated with medications, chronic kidney disease, COPD and a history of a stroke in March. This was a very small territory infarct. She has been admitted to the hospital multiple times for multiple different reasons, often it is for altered mental status. According to the family member who accompanies the patient, her granddaughter, she states that she was in her usual state of health until several days ago, she was having some right upper quadrant and right flank pain for the last several days and was scheduled to have some imaging done soon. This morning the patient was unable to get out of bed, she was generally weak, she had some confusion and decreased level of consciousness. She was brought to the hospital because of persistent altered mental status. There has been no fevers, no vomiting, no diarrhea, no swelling of the legs, no seizure activity. The family member believes that she saw some right-sided facial droop on arrival to the hospital.  Patient is a 74 y.o. female presenting with altered mental status. The history is provided by the patient.  Altered Mental Status   Past Medical History  Diagnosis Date  . CHF (congestive heart failure)   . Hypertension   . Back pain, chronic   . Hip pain, chronic   . GERD (gastroesophageal reflux disease)   . Crohn disease   . Diabetes mellitus without complication   . Esophageal stricture     s/p dilation 2003, 2008  . PUD (peptic ulcer disease)   . CKD (chronic kidney disease)   . COPD (chronic obstructive pulmonary  disease)   . Arthritis   . AKI (acute kidney injury)   . Sleep apnea    Past Surgical History  Procedure Laterality Date  . Abdominal hysterectomy    . Tonsillectomy    . Cholecystectomy    . Bladder surgery    . Esophagogastroduodenoscopy N/A 07/08/2014    Procedure: ESOPHAGOGASTRODUODENOSCOPY (EGD);  Surgeon: Ladene Artist, MD;  Location: [facility] ENDOSCOPY;  Service: Endoscopy;  Laterality: N/A;   Family History  Problem Relation Age of Onset  . Diabetes Mellitus II Other    History  Substance Use Topics  . Smoking status: Current Every Day Smoker -- 1.00 packs/day for 30 years    Types: Cigarettes  . Smokeless tobacco: Never Used  . Alcohol Use: No   OB History    No data available     Review of Systems  All other systems reviewed and are negative.     Allergies  Review of patient's allergies indicates no known allergies.  Home Medications   Prior to Admission medications   Medication Sig Start Date End Date Taking? Authorizing Provider  acetaminophen (TYLENOL) 500 MG tablet Take 1,000 mg by mouth every 6 (six) hours as needed for mild pain or moderate pain.   Yes Historical Provider, MD  aspirin EC 81 MG tablet Take 1 tablet (81 mg total) by mouth daily. 07/14/14  Yes Annita Brod, MD  Cholecalciferol (VITAMIN D) 2000 UNITS CAPS Take 1 capsule by mouth daily.   Yes Historical Provider, MD  folic acid (FOLVITE) 1  MG tablet Take 1 mg by mouth daily.   Yes Historical Provider, MD  isosorbide-hydrALAZINE (BIDIL) 20-37.5 MG per tablet Take 1 tablet by mouth 3 (three) times daily. 07/14/14  Yes Annita Brod, MD  labetalol (NORMODYNE) 100 MG tablet Take 1 tablet (100 mg total) by mouth 3 (three) times daily. 07/14/14  Yes Annita Brod, MD  metoCLOPramide (REGLAN) 10 MG tablet Take 1 tablet (10 mg total) by mouth 3 (three) times daily as needed for nausea. 08/04/14  Yes Lori P Hvozdovic, PA-C  ondansetron (ZOFRAN) 4 MG tablet Take 1 tablet (4 mg total) by mouth  every 6 (six) hours as needed for nausea. 06/16/14  Yes Velvet Bathe, MD  pantoprazole (PROTONIX) 40 MG tablet Take 1 tablet (40 mg total) by mouth 2 (two) times daily. 30 min prior to breakfast and 30 min prior to supper 08/04/14  Yes Lori P Hvozdovic, PA-C  PROAIR HFA 108 (90 BASE) MCG/ACT inhaler Inhale 1 puff into the lungs every 6 (six) hours as needed. Shortness of breath or wheezing 03/03/14  Yes Historical Provider, MD  traMADol (ULTRAM) 50 MG tablet Take 50 mg by mouth every 6 (six) hours as needed for moderate pain.   Yes Historical Provider, MD  traZODone (DESYREL) 100 MG tablet Take 100 mg by mouth at bedtime.   Yes Historical Provider, MD  vitamin B-12 1000 MCG tablet Take 1 tablet (1,000 mcg total) by mouth daily. 07/14/14  Yes Annita Brod, MD   BP 188/64 mmHg  Pulse 55  Temp(Src) 98 F (36.7 C) (Oral)  Resp 19  SpO2 99% Physical Exam  Constitutional: She appears well-developed and well-nourished.  HENT:  Head: Normocephalic and atraumatic.  Mouth/Throat: Oropharynx is clear and moist. No oropharyngeal exudate.  Eyes: Conjunctivae and EOM are normal. Pupils are equal, round, and reactive to light. Right eye exhibits no discharge. Left eye exhibits no discharge. No scleral icterus.  Neck: Normal range of motion. Neck supple. No JVD present. No thyromegaly present.  Cardiovascular: Regular rhythm, normal heart sounds and intact distal pulses.  Exam reveals no gallop and no friction rub.   No murmur heard. Bradycardia, strong pulses at the radial arteries  Pulmonary/Chest: Effort normal and breath sounds normal. No respiratory distress. She has no wheezes. She has no rales.  Abdominal: Soft. Bowel sounds are normal. She exhibits no distension and no mass. There is no tenderness.  Musculoskeletal: Normal range of motion. She exhibits no edema or tenderness.  Lymphadenopathy:    She has no cervical adenopathy.  Neurological:  Somnolent, arousable to loud voice, able to assist  in sitting up in bed with bilateral upper extremities, does not follow commands well, continues to slip back into a somnolent too obtunded state.  No facial droop, does not speak  Skin: Skin is warm and dry. No rash noted. No erythema.  Psychiatric: She has a normal mood and affect. Her behavior is normal.  Nursing note and vitals reviewed.   ED Course  Procedures (including critical care time) Labs Review Labs Reviewed  CBC - Abnormal; Notable for the following:    RBC 3.23 (*)    Hemoglobin 9.3 (*)    HCT 27.1 (*)    RDW 16.0 (*)    All other components within normal limits  DIFFERENTIAL - Abnormal; Notable for the following:    Eosinophils Relative 6 (*)    All other components within normal limits  COMPREHENSIVE METABOLIC PANEL - Abnormal; Notable for the following:  Potassium 3.0 (*)    Glucose, Bld 142 (*)    Creatinine, Ser 1.35 (*)    Total Protein 6.3 (*)    Albumin 3.3 (*)    ALT 9 (*)    GFR calc non Af Amer 38 (*)    GFR calc Af Amer 44 (*)    All other components within normal limits  CBG MONITORING, ED - Abnormal; Notable for the following:    Glucose-Capillary 152 (*)    All other components within normal limits  I-STAT CHEM 8, ED - Abnormal; Notable for the following:    Potassium 3.0 (*)    Creatinine, Ser 1.30 (*)    Glucose, Bld 142 (*)    Hemoglobin 10.2 (*)    HCT 30.0 (*)    All other components within normal limits  PROTIME-INR  APTT  URINE RAPID DRUG SCREEN, HOSP PERFORMED  URINALYSIS, ROUTINE W REFLEX MICROSCOPIC (NOT AT [facility])  ETHANOL  I-STAT TROPOININ, ED    Imaging Review Ct Head Wo Contrast  09/12/2014   CLINICAL DATA:  Sudden onset confusion.  Hypoxia.  EXAM: CT HEAD WITHOUT CONTRAST  TECHNIQUE: Contiguous axial images were obtained from the base of the skull through the vertex without intravenous contrast.  COMPARISON:  06/21/2014  FINDINGS: The brainstem, cerebellum, cerebral peduncles, thalamus, basal ganglia, basilar cisterns, and  ventricular system appear within normal limits. Periventricular white matter and corona radiata hypodensities favor chronic ischemic microvascular white matter disease. No intracranial hemorrhage, mass lesion, or acute CVA. Air-fluid level in the right maxillary sinus. Chronic ethmoid sinusitis.  There is atherosclerotic calcification of the cavernous carotid arteries bilaterally.  IMPRESSION: 1. No acute intracranial findings. 2. Acute right maxillary sinusitis and chronic ethmoid sinusitis. 3. The known prior left thalamic infarct is not well seen on today's CT.   Electronically Signed   By: Van Clines M.D.   On: 09/12/2014 10:43   Dg Chest Port 1 View  09/12/2014   CLINICAL DATA:  Altered level of consciousness.  EXAM: PORTABLE CHEST - 1 VIEW  COMPARISON:  07/07/2014  FINDINGS: There is bilateral mild interstitial thickening, likely chronic and unchanged compared with multiple prior exams. There is no focal parenchymal opacity. There is no pleural effusion or pneumothorax. The heart and mediastinal contours are unremarkable.  The osseous structures are unremarkable. Anchor screw from prior rotator cuff repair of the right shoulder.  IMPRESSION: No active disease.   Electronically Signed   By: Kathreen Devoid   On: 09/12/2014 09:38     EKG Interpretation   Date/Time:  Friday September 12 2014 08:48:42 EDT Ventricular Rate:  48 PR Interval:  154 QRS Duration: 100 QT Interval:  496 QTC Calculation: 443 R Axis:   -15 Text Interpretation:  Sinus bradycardia Borderline left axis deviation  Abnormal ekg since last tracing no significant change Confirmed by Sabra Heck   MD, Boneta Standre ([id number]) on 09/12/2014 9:05:51 AM      MDM   Final diagnoses:  Altered level of consciousness    The patient appears to be in neurologic distress, she has decreased level of consciousness, her GCS is depressed, her vital signs show bradycardia and decreased respiratory effort causing hypoxia to 80%, there are no abnormal  lung sounds, with supplemental oxygen by nasal cannula she improved to 100% on 2 L. We'll obtain stroke-type workup, could also be infectious, in and out catheterization for urine, chest x-ray, anticipate admission. EKG shows sinus bradycardia without other concerning findings.  At 9:20 AM, care discussed with  the neurologist who agrees that the patient does not need an emergent MRI, proceed with encephalopathy workup, unlikely to be TIA, more likely to be unmasking prior stroke symptoms. Narcan with no improvement, CBG normal 150  Labs and CT scan are unremarkable, the care was discussed with the family practice resident who will admit the patient to the hospital. No definite etiology has been found, on reexam the patient is persistently somnolent but arousable. No change in her mental status  Noemi Chapel, MD 09/12/14 1130

## 2014-09-12 NOTE — ED Notes (Signed)
Pt presents from home with c/o altered mental status.  Pt brought in by her granddaughter who reports patient awoke and was acting normally this morning. Daughter found pt in the bathroom "looking around and not really responding."  Pt was verbal on arrival to the ED but became less responsive with an O2 drop to 80% on RA. Pt also began drooling at this time.  Episode lasted ~5 minutes.  Pt is now following some commands, and is able to verbalize her name.  Dr. Sabra Heck at bedside. PT with hx CVA and Sepsis in March.

## 2014-09-12 NOTE — H&P (Signed)
[facility] Admission History and Physical Service Pager: [phone]  Patient name: Michelle Farrell Medical record number: [id number] Date of birth: 24-Dec-1940 Age: 74 y.o. Gender: female  Primary Care Provider: Yong Channel, MD Consultants: Neurology Code Status: Full per discussion at admission  Chief Complaint: Altered mental status  Assessment and Plan: Kaitlinn Iversen is a 74 y.o. female presenting with decreased alertness and intermittent right facial twitching in the setting of remote cortical infarct and recently taking tramadol consistent with partial seizures. PMH is significant for HTN, DM, diastolic HF, CKD, crohn's disease.   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 Waxing/waning diminished arousal without focal deficits and no metabolic cause concerning for post-ictal states and recurrent right facial tightening/twitching. Unchanged head CT. UDS negative, medications given by daughter without changes, no suspicion for ingestions, and no improvement with narcan.  - Admit to FMTS, Dr. Andria Frames attending, on telemetry floor - Start keppra load then 563m BID - MRI brain w/o contrast  - Doubt CVA, so will hold on further work up. - Neurology consultant recommendations appreciated  - Neuro checks q2h x12 hours - Seizure precautions.  - Continue home ASA  HTN/HFpEF: Isolated systolic HTN will limit improvements in systolic BP with medications.  - Continue BiDil.  - Hold home labetalol due to sinus bradycardia. Would consider alternative agent, but defer decision to primary care provider.   CKD: Cr actually down-trending since last admission, so unsure of true baseline. No signs of AKI/dehydration.  - Avoid nephrotoxins - Recheck Cr in AM - Dose medications appropriately. CrCl: 432mmin on admission.   Sinus bradycardia: Likely supratherapeutic beta blocker. Will  hold, as above. TSH 0.491 in April.  - Telemetry  History of T2DM: No longer on medications as Hb A1c 5.3 in March. Not hypoglycemic on arrival.  - CBG check qAM  Dyslipidemia: Not on home medication despite history of CVA. LDL 62, HDL 27 in March.  - Would consider moderate intensity statin as tolerated in this [age]o with history of CVA.    Hypokalemia: Mild. -  Replete po and recheck with Mg in AM  Anemia: Stable. Related to chronic disease. No suspicion for ABLA. - Monitor CBC - Continue home vitamins for crohn's disease including iron.   FEN/GI: heart healthy diet after passing swallow screen; saline lock IV Prophylaxis: Subcutaneous heparin Social: Primary care taker is her Daughter, present on admission, Michelle Farrell [phone], who is a [employer], jewelleryLikely to return home.   Disposition: Admit to FMTS on telemetry floor for further work up and treatment of seizures.  History of Present Illness: Michelle Farrell a [age].o. female presenting with diminished alertness and periodic right facial twitching episodes.   History is largely given by her daughter, with whom she has been staying. After going to sleep in her normal state of health, Michelle Farrell awoken by her daughter this morning and seemed to be "not right," in that she was not as alert as usual. She got up without difficulty and walked without imbalance to the bathroom but was staring off into nowhere instead of cleaning up. She wasn't answering questions appropriately and so her daughter brought her to the ED. She was found to be disoriented and somnolent but able to speak normally and walk, though with some global weakness. No syncope or LOC. She was able to speak clearly.   Her daughter also endorses periodic episodes of right facial  twitching during her last admission in March for a stroke, but had not seen this again until this morning. Earlier this week she was started on tramadol by a PA at her PCP's office to control  chronic back pain but has had no other changes in medications. She is given her meds by her daughter and all pill counts were correct.   She lives by herself but has been staying at her daughter's recently. Stopped smoking in March after her stroke. Does not drink or use illicit drugs. Has no history of either. She formerly used insulin for diabetes but switched to [place] then was taken off all medications because her A1c dropped so low a few months ago. She was taking sulfasalazine for crohn's but only take vitamins for this now. took aspirin today.   Review Of Systems: Per HPI with the following additions: No HA, vision changes, dizziness, lightheadedness, recent illnesses or change in weight or po intake, no chest pain, dyspnea, orthopnea, N/V/D/C, no blood in stool or urine, no dysuria or urinary frequency. She has some stable bilateral leg swelling and some stress urinary incontinence. No myalgias, arthralgias, rashes.   Otherwise 12 point review of systems was performed and was unremarkable.  Patient Active Problem List   Diagnosis Date Noted  . Altered level of consciousness 09/12/2014  . Anemia in chronic kidney disease 07/13/2014  . Nausea with vomiting 07/08/2014  . Abnormal CT scan, stomach 07/08/2014  . Duodenitis with hemorrhage 07/08/2014  . Gastritis and gastroduodenitis 07/08/2014  . Esophagitis determined by endoscopy 07/08/2014  . Hematemesis with nausea 07/07/2014  . Acute renal failure 07/04/2014  . Bradycardia 07/04/2014  . Hypokalemia 07/04/2014  . Anemia 07/04/2014  . Bacterial meningitis   . 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 Altered mental status   . Blood poisoning   . Fall   . Sepsis secondary to UTI   . Meningitis   . CVA (cerebral infarction)   . Sepsis 06/19/2014  . Acute encephalopathy 06/19/2014  . UTI (lower urinary tract infection) 06/19/2014  . Tobacco abuse 06/19/2014  . Acute gastroenteritis 06/12/2014  . Crohn's disease 06/12/2014   . DM (diabetes mellitus) 06/12/2014  . Metabolic acidosis 07/37/1062  . Hypercalcemia 06/12/2014  . Oral candidiasis 06/12/2014  . Renal failure 05/16/2014  . AKI (acute kidney injury) 05/16/2014  . Dehydration 05/16/2014  . Pelvic mass 05/16/2014  . Hypertension 05/16/2014  . Essential hypertension 05/16/2014  . Diastolic CHF 69/48/5462  . Back pain, chronic 05/16/2014  . Accelerated hypertension 08/28/2013  . DKA (diabetic ketoacidoses) 08/27/2013  . OBSTRUCTIVE SLEEP APNEA 11/14/2008  . DYSPNEA 11/14/2008  . Diabetes mellitus 06/28/2007  . HYPERLIPIDEMIA 06/28/2007  . NEUROPATHY 06/28/2007  . CHF 06/28/2007  . ESOPHAGEAL STRICTURE 06/28/2007  . HIATAL HERNIA 06/28/2007  . Crohn disease 06/28/2007  . POLYARTHRITIS 06/28/2007  . ELEVATED BLOOD PRESSURE 06/28/2007  . ALLERGY 06/28/2007  . PUD, HX OF 06/28/2007   Past Medical History: Past Medical History  Diagnosis Date  . CHF (congestive heart failure)   . Hypertension   . Back pain, chronic   . Hip pain, chronic   . GERD (gastroesophageal reflux disease)   . Crohn disease   . Diabetes mellitus without complication   . Esophageal stricture     s/p dilation 2003, 2008  . PUD (peptic ulcer disease)   . CKD (chronic kidney disease)   . COPD (chronic obstructive pulmonary disease)   . Arthritis   . AKI (acute kidney injury)   .  Sleep apnea    Past Surgical History: Past Surgical History  Procedure Laterality Date  . Abdominal hysterectomy    . Tonsillectomy    . Cholecystectomy    . Bladder surgery    . Esophagogastroduodenoscopy N/A 07/08/2014    Procedure: ESOPHAGOGASTRODUODENOSCOPY (EGD);  Surgeon: Ladene Artist, MD;  Location: [facility] ENDOSCOPY;  Service: Endoscopy;  Laterality: N/A;   Social History: History  Substance Use Topics  . Smoking status: Current Every Day Smoker -- 1.00 packs/day for 30 years    Types: Cigarettes  . Smokeless tobacco: Never Used  . Alcohol Use: No   Additional social  history: As above  Please also refer to relevant sections of EMR.  Family History: Family History  Problem Relation Age of Onset  . Diabetes Mellitus II Other    Allergies and Medications: No Known Allergies Current Facility-Administered Medications on File Prior to Encounter  Medication Dose Route Frequency Provider Last Rate Last Dose  . sodium chloride 0.9 % injection 10-40 mL  10-40 mL Intracatheter PRN Ladene Artist, MD   10 mL at 07/09/14 0501   Current Outpatient Prescriptions on File Prior to Encounter  Medication Sig Dispense Refill  . aspirin EC 81 MG tablet Take 1 tablet (81 mg total) by mouth daily.    . Cholecalciferol (VITAMIN D) 2000 UNITS CAPS Take 1 capsule by mouth daily.    . folic acid (FOLVITE) 1 MG tablet Take 1 mg by mouth daily.    . isosorbide-hydrALAZINE (BIDIL) 20-37.5 MG per tablet Take 1 tablet by mouth 3 (three) times daily. 90 tablet 1  . labetalol (NORMODYNE) 100 MG tablet Take 1 tablet (100 mg total) by mouth 3 (three) times daily. 90 tablet 1  . metoCLOPramide (REGLAN) 10 MG tablet Take 1 tablet (10 mg total) by mouth 3 (three) times daily as needed for nausea. 90 tablet 1  . ondansetron (ZOFRAN) 4 MG tablet Take 1 tablet (4 mg total) by mouth every 6 (six) hours as needed for nausea. 20 tablet 0  . pantoprazole (PROTONIX) 40 MG tablet Take 1 tablet (40 mg total) by mouth 2 (two) times daily. 30 min prior to breakfast and 30 min prior to supper 90 tablet 3  . PROAIR HFA 108 (90 BASE) MCG/ACT inhaler Inhale 1 puff into the lungs every 6 (six) hours as needed. Shortness of breath or wheezing  3  . traZODone (DESYREL) 100 MG tablet Take 100 mg by mouth at bedtime.    . vitamin B-12 1000 MCG tablet Take 1 tablet (1,000 mcg total) by mouth daily. 30 tablet 1    Objective: BP 188/64 mmHg  Pulse 55  Temp(Src) 98 F (36.7 C) (Oral)  Resp 19  SpO2 99% Exam: General: Calm elderly female sleeping in no distress Eyes: Anicteric, normal conjuntivae, PERRL,  EOMI ENTM: nares and oropharynx patent, edentulous Neck: Supple, thyroid not enlarged Cardiovascular: Sinus bradycardia on monitor, II/VI systolic murmur at LUSB radiating to carotids, LE's 1+ pitting edema bilaterally, no JVD Respiratory: Nonlabored 100% on 2L O2, clear breath sounds with good air mvoement Abdomen: Soft, +BS, NT, ND, no suprapubic tenderness nor CVA tenderness MSK: No gross deformities. Strength globally diminished (possibly due to diminished effort) Neuro: Somnolent but rousable, oriented to person and "hospital" but states she is in [place], knows [place]. Not oriented to time. CN II-XII intact grossly with difficulty maintaining concentration on tasks/questions. No focal sensory or motor deficits. patellar DTRs 2+. Toes downgoing bilaterally.   Labs and Imaging: CBC BMET  Recent Labs Lab 09/12/14 0853 09/12/14 0920  WBC 8.7  --   HGB 9.3* 10.2*  HCT 27.1* 30.0*  PLT 251  --     Recent Labs Lab 09/12/14 0853 09/12/14 0920  NA 139 139  K 3.0* 3.0*  CL 106 104  CO2 24  --   BUN 6 6  CREATININE 1.35* 1.30*  GLUCOSE 142* 142*  CALCIUM 10.0  --      Ct Head Wo Contrast  09/12/2014   CLINICAL DATA:  Sudden onset confusion.  Hypoxia.  EXAM: CT HEAD WITHOUT CONTRAST  TECHNIQUE: Contiguous axial images were obtained from the base of the skull through the vertex without intravenous contrast.  COMPARISON:  06/21/2014  FINDINGS: The brainstem, cerebellum, cerebral peduncles, thalamus, basal ganglia, basilar cisterns, and ventricular system appear within normal limits. Periventricular white matter and corona radiata hypodensities favor chronic ischemic microvascular white matter disease. No intracranial hemorrhage, mass lesion, or acute CVA. Air-fluid level in the right maxillary sinus. Chronic ethmoid sinusitis.  There is atherosclerotic calcification of the cavernous carotid arteries bilaterally.  IMPRESSION: 1. No acute intracranial findings. 2. Acute right maxillary  sinusitis and chronic ethmoid sinusitis. 3. The known prior left thalamic infarct is not well seen on today's CT.   Electronically Signed   By: Van Clines M.D.   On: 09/12/2014 10:43   Dg Chest Port 1 View  09/12/2014   CLINICAL DATA:  Altered level of consciousness.  EXAM: PORTABLE CHEST - 1 VIEW  COMPARISON:  07/07/2014  FINDINGS: There is bilateral mild interstitial thickening, likely chronic and unchanged compared with multiple prior exams. There is no focal parenchymal opacity. There is no pleural effusion or pneumothorax. The heart and mediastinal contours are unremarkable.  The osseous structures are unremarkable. Anchor screw from prior rotator cuff repair of the right shoulder.  IMPRESSION: No active disease.   Electronically Signed   By: Kathreen Devoid   On: 09/12/2014 09:38   UDS: neg ECG: sinus bradycardia, unchanged from prior  Patrecia Pour, MD 09/12/2014, 12:08 PM PGY-2, [place] Intern pager: [phone], text pages welcome\

## 2014-09-12 NOTE — Progress Notes (Signed)
Patient arrived to 24N02. Patient began to have seizure like activity upon arrival. Patient's face and cheek began to twitch, patient's tongue began sticking out, no noted gaze, unable to respond to RN or family, lasted about 90 seconds. Patient's heart rate from 59 to 39. Patient suctioned post seizure activity, small amount of thin white secretions suctioned. Per family, patient less alert than previously at ED. Patient not responding verbally, can follow commands. MD now at bedside. Patient now able to respond verbally although remains drowsy. Patient's BP 202/62. MD aware of BP and pulse. No new orders. Additional MD to come to bedside. Will continue to monitor closely.

## 2014-09-13 ENCOUNTER — Observation Stay (HOSPITAL_COMMUNITY): Payer: Medicare Other

## 2014-09-13 DIAGNOSIS — I6340 Cerebral infarction due to embolism of unspecified cerebral artery: Secondary | ICD-10-CM | POA: Diagnosis not present

## 2014-09-13 DIAGNOSIS — I10 Essential (primary) hypertension: Secondary | ICD-10-CM | POA: Diagnosis not present

## 2014-09-13 DIAGNOSIS — R569 Unspecified convulsions: Secondary | ICD-10-CM | POA: Diagnosis not present

## 2014-09-13 DIAGNOSIS — G9340 Encephalopathy, unspecified: Secondary | ICD-10-CM | POA: Diagnosis not present

## 2014-09-13 LAB — CBC
HCT: 29.9 % — ABNORMAL LOW (ref 36.0–46.0)
HEMOGLOBIN: 10.4 g/dL — AB (ref 12.0–15.0)
MCH: 28.6 pg (ref 26.0–34.0)
MCHC: 34.8 g/dL (ref 30.0–36.0)
MCV: 82.1 fL (ref 78.0–100.0)
Platelets: 304 10*3/uL (ref 150–400)
RBC: 3.64 MIL/uL — ABNORMAL LOW (ref 3.87–5.11)
RDW: 16.1 % — AB (ref 11.5–15.5)
WBC: 10.1 10*3/uL (ref 4.0–10.5)

## 2014-09-13 LAB — BASIC METABOLIC PANEL
Anion gap: 13 (ref 5–15)
Anion gap: 9 (ref 5–15)
BUN: 5 mg/dL — ABNORMAL LOW (ref 6–20)
BUN: <5 mg/dL — ABNORMAL LOW (ref 6–20)
CALCIUM: 9.9 mg/dL (ref 8.9–10.3)
CHLORIDE: 100 mmol/L — AB (ref 101–111)
CO2: 26 mmol/L (ref 22–32)
CO2: 25 mmol/L (ref 22–32)
CREATININE: 1.01 mg/dL — AB (ref 0.44–1.00)
CREATININE: 0.94 mg/dL (ref 0.44–1.00)
Calcium: 10 mg/dL (ref 8.9–10.3)
Chloride: 104 mmol/L (ref 101–111)
GFR calc non Af Amer: 54 mL/min — ABNORMAL LOW (ref >60)
GFR calc non Af Amer: 59 mL/min — ABNORMAL LOW (ref >60)
Glucose, Bld: 135 mg/dL — ABNORMAL HIGH (ref 65–99)
Glucose, Bld: 154 mg/dL — ABNORMAL HIGH (ref 65–99)
Potassium: 2.5 mmol/L — CL (ref 3.5–5.1)
Potassium: 3.2 mmol/L — ABNORMAL LOW (ref 3.5–5.1)
Sodium: 139 mmol/L (ref 135–145)
Sodium: 138 mmol/L (ref 135–145)

## 2014-09-13 LAB — GLUCOSE, CAPILLARY: GLUCOSE-CAPILLARY: 126 mg/dL — AB (ref 65–99)

## 2014-09-13 LAB — MAGNESIUM: MAGNESIUM: 1.6 mg/dL — AB (ref 1.7–2.4)

## 2014-09-13 MED ORDER — POTASSIUM CHLORIDE CRYS ER 20 MEQ PO TBCR
20.0000 meq | EXTENDED_RELEASE_TABLET | Freq: Two times a day (BID) | ORAL | Status: AC
  Administered 2014-09-13 – 2014-09-14 (×2): 20 meq via ORAL
  Filled 2014-09-13 (×2): qty 1

## 2014-09-13 MED ORDER — CLOPIDOGREL BISULFATE 75 MG PO TABS
75.0000 mg | ORAL_TABLET | Freq: Every day | ORAL | Status: DC
  Administered 2014-09-13 – 2014-09-15 (×3): 75 mg via ORAL
  Filled 2014-09-13 (×3): qty 1

## 2014-09-13 MED ORDER — MAGNESIUM SULFATE IN D5W 10-5 MG/ML-% IV SOLN
1.0000 g | Freq: Once | INTRAVENOUS | Status: AC
  Administered 2014-09-13: 1 g via INTRAVENOUS
  Filled 2014-09-13: qty 100

## 2014-09-13 MED ORDER — SODIUM CHLORIDE 0.9 % IV SOLN
500.0000 mg | Freq: Two times a day (BID) | INTRAVENOUS | Status: DC
  Administered 2014-09-13 – 2014-09-14 (×4): 500 mg via INTRAVENOUS
  Filled 2014-09-13 (×5): qty 5

## 2014-09-13 MED ORDER — POTASSIUM CHLORIDE 10 MEQ/100ML IV SOLN
10.0000 meq | INTRAVENOUS | Status: AC
  Administered 2014-09-13 (×6): 10 meq via INTRAVENOUS
  Filled 2014-09-13 (×6): qty 100

## 2014-09-13 MED ORDER — HYDRALAZINE HCL 20 MG/ML IJ SOLN
5.0000 mg | Freq: Four times a day (QID) | INTRAMUSCULAR | Status: DC | PRN
  Filled 2014-09-13: qty 1

## 2014-09-13 NOTE — Progress Notes (Signed)
EEG Completed; Results Pending  

## 2014-09-13 NOTE — Progress Notes (Signed)
CRITICAL VALUE ALERT  Critical value received:  2.5  Date of notification: 09/13/14  Time of notification:  0655  Critical value read back:Yes.    Nurse who received alert:  Brittnay Pigman [id number]  MD notified (1st page):  Dr Lonny Prude  Time of first page:  0700  MD notified (2nd page):NA  Time of second page:NA  Responding MD:  NA  Time MD responded:  [phone]

## 2014-09-13 NOTE — Evaluation (Signed)
Clinical/Bedside Swallow Evaluation Patient Details  Name: Michelle Farrell MRN: [id number] Date of Birth: December 08, 1940  Today's Date: 09/13/2014 Time: SLP Start Time (ACUTE ONLY): 1340 SLP Stop Time (ACUTE ONLY): 1400 SLP Time Calculation (min) (ACUTE ONLY): 20 min  Past Medical History:  Past Medical History  Diagnosis Date  . CHF (congestive heart failure)   . Hypertension   . Back pain, chronic   . Hip pain, chronic   . GERD (gastroesophageal reflux disease)   . Crohn disease   . Diabetes mellitus without complication   . Esophageal stricture     s/p dilation 2003, 2008  . PUD (peptic ulcer disease)   . CKD (chronic kidney disease)   . COPD (chronic obstructive pulmonary disease)   . Arthritis   . AKI (acute kidney injury)   . Sleep apnea    Past Surgical History:  Past Surgical History  Procedure Laterality Date  . Abdominal hysterectomy    . Tonsillectomy    . Cholecystectomy    . Bladder surgery    . Esophagogastroduodenoscopy N/A 07/08/2014    Procedure: ESOPHAGOGASTRODUODENOSCOPY (EGD);  Surgeon: Ladene Artist, MD;  Location: [facility] ENDOSCOPY;  Service: Endoscopy;  Laterality: N/A;   HPI:  74 y.o. female presenting with decreased alertness and intermittent right facial twitching in the setting of remote cortical infarct and recently taking tramadol consistent with partial seizures. PMH is significant for HTN, DM, diastolic HF, CKD, crohn's disease.  MR head indicated on 09/13/14 Subtle 6 mm focus of restricted diffusion within the left middle cerebellar peduncle, suspicious for small acute ischemic infarct. No associated hemorrhage or mass effect.  Assessment / Plan / Recommendation Clinical Impression   Pt without overt s/s of aspiration noted during BSE with graduated volumes of liquid, puree and solid consistencies assessed with decreased mastication, mild oral residue (cleared with subsequent swallows/liquid wash) and multiple swallows noted with solids only; pt denies  dysphagia, but with new onset neurological symptoms, she is at mild risk for aspiration at present time.  Recommend Dysphagia 3/thin with ST f/u 1-2 times for tolerance of diet/compensatory strategies prn.    Aspiration Risk  Mild    Diet Recommendation Dysphagia 3 (Mech soft);Thin   Medication Administration: Other (Comment) (as tolerated) Compensations: Slow rate;Small sips/bites    Other  Recommendations Oral Care Recommendations: Oral care BID   Follow Up Recommendations       Frequency and Duration min 2x/week  1 week   Pertinent Vitals/Pain Elevated BP    SLP Swallow Goals  See POC   Swallow Study Prior Functional Status   Independent at home    General Date of Onset: 09/12/14 Other Pertinent Information: 74 y.o. female presenting with decreased alertness and intermittent right facial twitching in the setting of remote cortical infarct and recently taking tramadol consistent with partial seizures. PMH is significant for HTN, DM, diastolic HF, CKD, crohn's disease.  Type of Study: Bedside swallow evaluation Diet Prior to this Study: NPO Temperature Spikes Noted: No Respiratory Status: Other (comment) ([place]) History of Recent Intubation: No Behavior/Cognition: Alert;Cooperative Oral Cavity - Dentition: Edentulous Self-Feeding Abilities: Able to feed self;Needs assist Patient Positioning: Upright in bed Baseline Vocal Quality: Low vocal intensity Volitional Cough: Strong Volitional Swallow: Able to elicit    Oral/Motor/Sensory Function Overall Oral Motor/Sensory Function: Appears within functional limits for tasks assessed   Ice Chips Ice chips: Within functional limits   Thin Liquid Thin Liquid: Within functional limits Presentation: Cup;Straw    Nectar Thick Nectar Thick Liquid: Not  tested   Honey Thick Honey Thick Liquid: Not tested   Puree Puree: Within functional limits Presentation: Self Fed   Solid       Solid: Impaired Presentation: Self Fed Oral  Phase Impairments: Impaired mastication Oral Phase Functional Implications: Oral residue;Other (comment) (cleared with subsequent swallow/liquids) Pharyngeal Phase Impairments: Multiple swallows       Ancelmo Hunt,PAT, M.S., CCC-SLP 09/13/2014,2:06 PM

## 2014-09-13 NOTE — Discharge Summary (Signed)
[facility] Discharge Summary  Patient name: Michelle Farrell Medical record number: [id number] Date of birth: [birth year] Age: 74 y.o. 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 rate 18, SpO2 100 %. Gen: Alert, oriented, interactive elderly female sitting up in the bedside chair in NAD. CV: RRR, no murmur, rub or gallops noted. Pulm: nonlabored, CTAB Neuro: Generally weak but strength 5/5, slow gait. Normal speech.  Brief Hospital Course:  [name] is a 74 y/o with a PMHx recent CVA who presented to the ED with intermittent R facial twitching and decreased alertness concerning for seizure vs CVA. In the ED, her head CT was unchanged, there were no metabolic derangements noted, UDS was negative, and there was no improvement with Narcan.   Neurology was consulted who suggested a Keppra load, then Keppra 563m BID. MRI revealed a possible acute infarct in the left middle cerebellar peduncle, thought to be incidental to symptoms. Due to this, aspirin was replaced by plavix and lipitor 432mwas started (LDL 138). At time of EEG, there was no evidence of epileptiform activity.  Her blood pressures, specifically SBP, remained elevated. Bidil was continued but labetalol was discontinued due to sinus bradycardia (HR into high  30's low 40s with some improvement to 50/60s prior to discharge. She had previously been on an ACE inhibitor and spironolactone, though these were stopped during her last admission due to renal failure. Due to not having a history of CAD, the beta blocker was discontinued at discharge pending PCP follow up.   Issues for Follow Up:  1. Follow up blood pressures which were elevated.  2. Consider adding another beta blocker if indicated, possibly coreg vs metoprolol. Could also consider the addition of an ACE-in given improved renal function. 3. Monitor for further seizure activity. Avoid tramadol and other seizure threshold-lowering drugs.   Significant Procedures:  6/18: EEG: normal EEG is recorded in the waking and sleep state. There was no seizure or seizure predisposition recorded on this study.   Significant Labs and Imaging:   Recent Labs Lab 09/12/14 0853 09/12/14 0920 09/13/14 0550  WBC 8.7  --  10.1  HGB 9.3* 10.2* 10.4*  HCT 27.1* 30.0* 29.9*  PLT 251  --  304    Recent Labs Lab 09/12/14 0853 09/12/14 0920 09/13/14 0550 09/13/14 1620 09/14/14 0550 09/15/14 0635  NA 139 139 139 138 139 136  K 3.0* 3.0* 2.5* 3.2* 3.2* 3.4*  CL 106 104 100* 104 105 104  CO2 24  --  26 25 24 23   GLUCOSE 142* 142* 135* 154* 120* 141*  BUN 6 6 5* <5* <5* <5*  CREATININE 1.35* 1.30* 1.01* 0.94 0.94 1.00  CALCIUM 10.0  --  10.0 9.9 10.3 10.0  MG  --   --  1.6*  --   --  1.9  ALKPHOS 53  --   --   --   --   --   AST  15  --   --   --   --   --   ALT 9*  --   --   --   --   --   ALBUMIN 3.3*  --   --   --   --   --    U/A: negative for LE and nitrite UDS: negative    CT brain: 1. No acute intracranial findings. 2. Acute right maxillary sinusitis and chronic ethmoid sinusitis. 3. The known prior left thalamic infarct is not well seen on today's CT.  CXR: No active disease.  MRI brain:  1. Subtle 6 mm focus of restricted diffusion within the left middle cerebellar peduncle,  suspicious for small acute ischemic infarct. No associated hemorrhage or mass effect. 2. 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 There is an additional focus of encephalomalacia slightly more superiorly within the anterior inferior right frontal lobe. Query remote trauma. 3. Atrophy with mild chronic small vessel ischemic disease. 4. Chronic right maxillary sinus disease.  Results/Tests Pending at Time of Discharge: None   Discharge Medications:    Medication List    STOP taking these medications        aspirin EC 81 MG tablet     labetalol 100 MG tablet  Commonly known as:  NORMODYNE      TAKE these medications        acetaminophen 500 MG tablet  Commonly known as:  TYLENOL  Take 1,000 mg by mouth every 6 (six) hours as needed for mild pain or moderate pain.     atorvastatin 40 MG tablet  Commonly known as:  LIPITOR  Take 1 tablet (40 mg total) by mouth daily at 6 PM.     clopidogrel 75 MG tablet  Commonly known as:  PLAVIX  Take 1 tablet (75 mg total) by mouth daily.     cyanocobalamin 1000 MCG tablet  Take 1 tablet (1,000 mcg total) by mouth daily.     folic acid 1 MG tablet  Commonly known as:  FOLVITE  Take 1 mg by mouth daily.     isosorbide-hydrALAZINE 20-37.5 MG per tablet  Commonly known as:  BIDIL  Take 1 tablet by mouth 3 (three) times daily.     levETIRAcetam 500 MG tablet  Commonly known as:  KEPPRA  Take 1 tablet (500 mg total) by mouth 2 (two) times daily.     metoCLOPramide 10 MG tablet  Commonly known as:  REGLAN  Take 1 tablet (10 mg total) by mouth 3 (three) times daily as needed for nausea.     ondansetron 4 MG tablet  Commonly known as:  ZOFRAN  Take 1 tablet (4 mg total) by mouth every 6 (six) hours as needed for nausea.     pantoprazole 40 MG tablet  Commonly known as:  PROTONIX  Take 1 tablet (40 mg total) by mouth 2 (two) times  daily. 30 min prior to breakfast and 30 min prior to supper     PROAIR HFA 108 (90 BASE) MCG/ACT inhaler  Generic drug:  albuterol  Inhale 1 puff into the lungs every 6 (six) hours as needed. Shortness of breath or wheezing     traZODone 100 MG tablet  Commonly known as:  DESYREL  Take 100 mg by mouth at bedtime.     Vitamin D 2000 UNITS Caps  Take 1 capsule by mouth daily.  Discharge Instructions: Please refer to Patient Instructions section of EMR for full details.  Patient was counseled important signs and symptoms that should prompt return to medical care, changes in medications, dietary instructions, activity restrictions, and follow up appointments.   Follow-Up Appointments: Follow-up Information    Schedule an appointment as soon as possible for a visit with CABEZA,YURI, MD.   Specialty:  Internal Medicine   Contact information:   [address] [place] [address] [phone]       Follow up with SETHI,PRAMOD, MD On 10/13/2014.   Specialties:  Neurology, Radiology   Contact information:   [address] [place] [address] [phone]       Archie Patten, MD, PGY-1  09/15/2014 1:32 PM

## 2014-09-13 NOTE — Procedures (Signed)
History: 74 yo F with seizures.   Sedation: None  Technique: This is a 19 channel routine scalp EEG performed at the bedside with bipolar and monopolar montages arranged in accordance to the international 10/20 system of electrode placement. One channel was dedicated to EKG recording.    Background: The background consists of intermixed alpha and beta activities. There is a well defined posterior dominant rhythm of 8 Hz that attenuates with eye opening. Sleep is recorded with normal appearing structures.   Photic stimulation: Physiologic driving is not performed  EEG Abnormalities: none  Clinical Interpretation: This normal EEG is recorded in the waking and sleep state. There was no seizure or seizure predisposition recorded on this study.   Roland Rack, MD [facility] [phone]  If 7pm- 7am, please page neurology on call as listed in [place].

## 2014-09-13 NOTE — Progress Notes (Signed)
Subjective: Much improved compared to yesterday  Exam: Filed Vitals:   09/13/14 1524  BP: 205/61  Pulse:   Temp:   Resp:    Gen: In bed, NAD MS: Awake, alert, not oriented to place "[facility]" or year "1966" CN: Pupils equal round and reactive, extra movements intact Motor: Moves all extremities well Sensory: Intact to light touch  MRI showed tiny punctate infarct in the cerebellar peduncle.  Impression: 74 year old female with seizures. The tiny infarct seen on MRI is incidental and unrelated to her seizures or her presentation with altered mental status.  She seems to be heading in the right direction, though still slightly confused.  Given the limited options she has for further risk factor control  Given she just had a stroke workup, I do not think that she needs repeated the checking an LDL to see how it is being managed might be reasonable. Given there are not many other changes to make, it may be reasonable to change her aspirin to Plavix as there is some thought that it might be very slightly more effective in the setting of someone who is failed aspirin.  Recommendations: 1) lipid panel, if LDL is greater than 100 then could increase statin 2) change aspirin to Plavix 75 mg daily 3) continue Keppra 500 mg twice a day 4) no further recommendations at this time, please call with any further questions or concerns.  Roland Rack, MD [facility] [phone]  If 7pm- 7am, please page neurology on call as listed in [place].

## 2014-09-13 NOTE — Progress Notes (Signed)
Family Medicine Teaching Service Daily Progress Note Intern Pager: [phone]  Patient name: Michelle Farrell Medical record number: [id number] Date of birth: 04-12-1940 Age: 74 y.o. Gender: female  Primary Care Provider: Yong Channel, MD Consultants: Neurology  Code Status: Full per discussion on admission   Pt Overview and Major Events to Date:  6/17: admitted with decreased alertness and intermittent R facial twitching.   Assessment and Plan: Michelle Farrell is a 74 y.o. female presenting with decreased alertness and intermittent right facial twitching in the setting of remote cortical infarct and recently taking tramadol consistent with partial seizures. PMH is significant for HTN, DM, diastolic HF, CKD, crohn's disease.   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 Waxing/waning diminished arousal without focal deficits and no metabolic cause.  Presentation with post-ictal state and recurrent right facial tightening/twitching with concerns for seizures.  Unchanged head CT. UDS negative, medications given by daughter without changes, no suspicion for ingestions, and no improvement with narcan. MRI brain w/o contrast revealed a possible acute infarct in the left middle cerebellar peduncle - Continue to monitor on telemetry - s/p keppra load then 527m BID > will change to IV today given NPO status - Neurology consultant recommendations appreciated  - EEG today - Neuro checks  - Seizure precautions.  - Continue home ASA - Currently NPO given drowsiness. - After EEG, PT/OT consult once more alert   HTN/HFpEF: Isolated systolic HTN will limit improvements in systolic BP with medications.  - Continue BiDil once taking better PO - Hold home labetalol due to sinus bradycardia. Would consider alternative agent, but defer decision to primary care provider - Has hydral 580mIV SBP>220 for permissive HTN  in the setting of new acute infarct   CKD: Cr 1.35 on admission and actually down-trending since last admission, so unsure of true baseline. No signs of AKI/dehydration.  - Improved to 1.01  - Avoid nephrotoxins - Dose medications appropriately. CrCl: 4429min on admission.    Sinus bradycardia: Likely supratherapeutic beta blocker. Will hold, as above. TSH 0.491 in April.  - Continue to monitor on telemetry  History of T2DM: No longer on medications as Hb A1c 5.3 in March. Not hypoglycemic on arrival.  - CBG check qAM  Dyslipidemia: Not on home medication despite history of CVA. LDL 62, HDL 27 in March.  - Would consider moderate intensity statin as tolerated in this [age] with history of CVA once taking PO.  Hypokalemia/hypomagnesia : Mild at 3.0 on admission. Does not appear to have been repleted (possibly due to NPO status).  - Even lower today at 2.5. Mag low at 1.6 - KCL 82m51m 6 runs given drowsiness  - Magnesium sulfate 1g IV x 1.  - Continue to monitor  Crohn's disease: well controlled. Sees [facility] - continue home vitamins   Anemia: Stable. Related to chronic disease.  - Monitor CBC - Continue home vitamins for crohn's disease including iron once taking PO  FEN/GI: heart healthy diet after passing swallow screen, however NPO for now; saline lock IV (consider addition of fluids if continues to be NPO Prophylaxis: Subcutaneous heparin  Social: Primary care taker is her grandddaughter, EvanGorden [phone] is a NursDesigner, jewellerykely to return home.   Disposition: pending work up and PT recs  Subjective:  Patient still very sleepy. No pain. Doesn't recall twitching spells.  Objective: Temp:  [97.6 F (36.4 C)-99.7 F (37.6 C)] 98.8  F (37.1 C) (06/18 0552) Pulse Rate:  [46-70] 60 (06/18 0552) Resp:  [10-22] 16 (06/18 0552) BP: (155-203)/(46-78) 177/51 mmHg (06/18 0722) SpO2:  [80 %-100 %] 100 % (06/18 0552) Physical Exam: General: lying  in bed asleep. Has a difficult time keeping her eyes open when I ask. Cardiovascular: RRR, no m/r/g noted. No LE edema noted. Respiratory: CTAB without w/r/c. No increased WOB Abdomen: +BS, soft, ND/NT Extremities: No gross deformities  Neuro: Able to answer yes or no questions. Easily arousable but intermittently closes her eyes and is distracted. PERRL. Facial movements symmetric.  5/5 strength in the UE and LE b/l. Sensation grossly intact.  Laboratory:  Recent Labs Lab 09/12/14 0853 09/12/14 0920 09/13/14 0550  WBC 8.7  --  10.1  HGB 9.3* 10.2* 10.4*  HCT 27.1* 30.0* 29.9*  PLT 251  --  304    Recent Labs Lab 09/12/14 0853 09/12/14 0920 09/13/14 0550  NA 139 139 139  K 3.0* 3.0* 2.5*  CL 106 104 100*  CO2 24  --  26  BUN 6 6 5*  CREATININE 1.35* 1.30* 1.01*  CALCIUM 10.0  --  10.0  PROT 6.3*  --   --   BILITOT 0.4  --   --   ALKPHOS 53  --   --   ALT 9*  --   --   AST 15  --   --   GLUCOSE 142* 142* 135*   Risk Stratification Labs  TSH    Component Value Date/Time   TSH 0.491 07/05/2014 0600   Hemoglobin A1C    Component Value Date/Time   HGBA1C 5.3 06/20/2014 0424   Lipid Panel     Component Value Date/Time   CHOL 122 06/21/2014 0550   TRIG 165* 06/21/2014 0550   HDL 27* 06/21/2014 0550   CHOLHDL 4.5 06/21/2014 0550   VLDL 33 06/21/2014 0550   LDLCALC 62 06/21/2014 0550   U/A: negative for LE and nitrite UDS: negative    Imaging/Diagnostic Tests: CT brain: 1. No acute intracranial findings. 2. Acute right maxillary sinusitis and chronic ethmoid sinusitis. 3. The known prior left thalamic infarct is not well seen on today's CT.  CXR: No active disease.  MRI brain:  1. Subtle 6 mm focus of restricted diffusion within the left middle cerebellar peduncle, suspicious for small acute ischemic infarct. No associated hemorrhage or mass effect. 2. 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 There is an additional focus of encephalomalacia slightly more superiorly within the anterior inferior right frontal lobe. Query remote trauma. 3. Atrophy with mild chronic small vessel ischemic disease. 4. Chronic right maxillary sinus disease.  Archie Patten, MD 09/13/2014, 8:07 AM PGY-1, [place] Intern pager: [phone], text pages welcome

## 2014-09-14 DIAGNOSIS — G40909 Epilepsy, unspecified, not intractable, without status epilepticus: Secondary | ICD-10-CM

## 2014-09-14 DIAGNOSIS — R404 Transient alteration of awareness: Secondary | ICD-10-CM | POA: Diagnosis not present

## 2014-09-14 DIAGNOSIS — G9340 Encephalopathy, unspecified: Secondary | ICD-10-CM | POA: Diagnosis not present

## 2014-09-14 DIAGNOSIS — I69398 Other sequelae of cerebral infarction: Secondary | ICD-10-CM | POA: Diagnosis not present

## 2014-09-14 DIAGNOSIS — I6340 Cerebral infarction due to embolism of unspecified cerebral artery: Secondary | ICD-10-CM | POA: Diagnosis not present

## 2014-09-14 LAB — BASIC METABOLIC PANEL
Anion gap: 10 (ref 5–15)
CO2: 24 mmol/L (ref 22–32)
Calcium: 10.3 mg/dL (ref 8.9–10.3)
Chloride: 105 mmol/L (ref 101–111)
Creatinine, Ser: 0.94 mg/dL (ref 0.44–1.00)
GFR calc Af Amer: >60 mL/min (ref >60)
GFR, EST NON AFRICAN AMERICAN: 59 mL/min — AB (ref >60)
GLUCOSE: 120 mg/dL — AB (ref 65–99)
POTASSIUM: 3.2 mmol/L — AB (ref 3.5–5.1)
Sodium: 139 mmol/L (ref 135–145)

## 2014-09-14 LAB — GLUCOSE, CAPILLARY
GLUCOSE-CAPILLARY: 121 mg/dL — AB (ref 65–99)
GLUCOSE-CAPILLARY: 169 mg/dL — AB (ref 65–99)

## 2014-09-14 LAB — LIPID PANEL
CHOLESTEROL: 213 mg/dL — AB (ref 0–200)
HDL: 35 mg/dL — AB (ref >40)
LDL Cholesterol: 138 mg/dL — ABNORMAL HIGH (ref 0–99)
TRIGLYCERIDES: 200 mg/dL — AB (ref <150)
Total CHOL/HDL Ratio: 6.1 RATIO
VLDL: 40 mg/dL (ref 0–40)

## 2014-09-14 MED ORDER — MAGNESIUM SULFATE 2 GM/50ML IV SOLN
2.0000 g | Freq: Once | INTRAVENOUS | Status: AC
  Administered 2014-09-14: 2 g via INTRAVENOUS
  Filled 2014-09-14: qty 50

## 2014-09-14 MED ORDER — ATORVASTATIN CALCIUM 40 MG PO TABS
40.0000 mg | ORAL_TABLET | Freq: Every day | ORAL | Status: DC
  Administered 2014-09-14: 40 mg via ORAL
  Filled 2014-09-14: qty 1

## 2014-09-14 MED ORDER — ATORVASTATIN CALCIUM 40 MG PO TABS: 40.0000 mg | ORAL_TABLET | Freq: Every day | ORAL | Status: DC

## 2014-09-14 MED ORDER — CLOPIDOGREL BISULFATE 75 MG PO TABS: 75.0000 mg | ORAL_TABLET | Freq: Every day | ORAL | Status: DC

## 2014-09-14 MED ORDER — POTASSIUM CHLORIDE CRYS ER 20 MEQ PO TBCR
40.0000 meq | EXTENDED_RELEASE_TABLET | Freq: Once | ORAL | Status: AC
  Administered 2014-09-14: 40 meq via ORAL
  Filled 2014-09-14: qty 2

## 2014-09-14 MED ORDER — LEVETIRACETAM 500 MG PO TABS: 500.0000 mg | ORAL_TABLET | Freq: Two times a day (BID) | ORAL | Status: DC

## 2014-09-14 NOTE — Evaluation (Signed)
Physical Therapy Evaluation Patient Details Name: Michelle Farrell MRN: [id number] DOB: Feb 13, 1941 Today's Date: 09/14/2014   History of Present Illness  74 y.o. female presenting with decreased alertness and intermittent right facial twitching. EEG without conclusive results, no new infarct detected.  PMH is significant for HTN, DM, diastolic HF, CKD, crohn's disease. MR head indicated on 09/13/14 Subtle 6 mm focus of restricted diffusion within the left middle cerebellar peduncle, suspicious for small acute ischemic infarct.   Clinical Impression  Patient with flat affect and unsteady mobility today. Patient needed encouragement to stay sitting up to eat lunch.  Patient may benefit from further skilled PT to improve mobility, reduce fall risk and incr functional independence. Will continue to follow patient while on this venue of care to progress mobility.     Follow Up Recommendations Home health PT;Supervision/Assistance - 24 hour    Equipment Recommendations  None recommended by PT    Recommendations for Other Services       Precautions / Restrictions Precautions Precautions: Fall Restrictions Weight Bearing Restrictions: No      Mobility  Bed Mobility Overal bed mobility: Independent             General bed mobility comments: sit to supine with bed flat  Transfers Overall transfer level: Needs assistance Equipment used: None Transfers: Sit to/from Stand Sit to Stand: Supervision         General transfer comment: slightly unsteady upon rising into standing  Ambulation/Gait Ambulation/Gait assistance: Min assist Ambulation Distance (Feet): 110 Feet Assistive device: None Gait Pattern/deviations: Decreased stance time - right;Decreased step length - right;Trunk flexed;Drifts right/left     General Gait Details: staggering gait, reaching for rail in hall on left  Stairs            Wheelchair Mobility    Modified Rankin (Stroke Patients Only)       Balance Overall balance assessment: Needs assistance Sitting-balance support: Feet supported;No upper extremity supported Sitting balance-Leahy Scale: Fair     Standing balance support: No upper extremity supported Standing balance-Leahy Scale: Fair                               Pertinent Vitals/Pain Pain Assessment: No/denies pain    Home Living Family/patient expects to be discharged to:: Private residence Living Arrangements: Children;Other relatives Available Help at Discharge: Available PRN/intermittently;Family Type of Home: House       Home Layout: One level Home Equipment: Polo - 2 wheels;Kasandra Knudsen - single point Additional Comments: lives alone, dau checks in regularly.  She sometimes stays wtih her granddaughter in level home without steps to enter.  Stands to shower with grab bar    Prior Function Level of Independence: Independent         Comments: has RW and cane but doesn't use per daughter     Hand Dominance   Dominant Hand: Right    Extremity/Trunk Assessment   Upper Extremity Assessment: Overall WFL for tasks assessed           Lower Extremity Assessment: Overall WFL for tasks assessed      Cervical / Trunk Assessment: Normal  Communication   Communication: No difficulties  Cognition Arousal/Alertness: Lethargic Behavior During Therapy: WFL for tasks assessed/performed;Flat affect Overall Cognitive Status: Within Functional Limits for tasks assessed                      General Comments General comments (skin  integrity, edema, etc.): post exercise HR 70 bpm, 98% on room air    Exercises        Assessment/Plan    PT Assessment Patient needs continued PT services  PT Diagnosis Difficulty walking;Abnormality of gait;Generalized weakness   PT Problem List Decreased strength;Decreased activity tolerance;Decreased balance;Decreased mobility;Decreased safety awareness  PT Treatment Interventions DME  instruction;Gait training;Functional mobility training;Therapeutic activities;Therapeutic exercise;Balance training;Patient/family education   PT Goals (Current goals can be found in the Care Plan section) Acute Rehab PT Goals Patient Stated Goal: rest PT Goal Formulation: With patient/family Time For Goal Achievement: 09/20/14 Potential to Achieve Goals: Good    Frequency Min 3X/week   Barriers to discharge        Co-evaluation               End of Session Equipment Utilized During Treatment: Gait belt Activity Tolerance: Patient limited by fatigue Patient left: in bed;with call bell/phone within reach;with bed alarm set;with family/visitor present;Other (comment) (sitting EOB to eat lunch with dau to assist) Nurse Communication: Mobility status;Precautions         Time: 6063-0160 PT Time Calculation (min) (ACUTE ONLY): 27 min   Charges:   PT Evaluation $Initial PT Evaluation Tier I: 1 Procedure     PT G CodesMalka So, PT [phone]  [place] 09/14/2014, 12:07 PM

## 2014-09-14 NOTE — Progress Notes (Signed)
Family Medicine Teaching Service Daily Progress Note Intern Pager: [phone]  Patient name: Michelle Farrell Medical record number: [id number] Date of birth: 13-Feb-1941 Age: 74 y.o. Gender: female  Primary Care Provider: Yong Channel, MD Consultants: Neurology  Code Status: Full per discussion on admission   Pt Overview and Major Events to Date:  6/17: admitted with decreased alertness and intermittent R facial twitching. Keppra load. 6/19: still somewhat drowsy  Assessment and Plan: Michelle Farrell is a 74 y.o. female presenting with decreased alertness and intermittent right facial twitching in the setting of remote cortical infarct and recently taking tramadol consistent with partial seizures. PMH is significant for HTN, DM, diastolic HF, CKD, crohn's disease.   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 Waxing/waning diminished arousal without focal deficits and no metabolic cause. MRI brain w/o contrast revealed a possible acute infarct in the left middle cerebellar peduncle - Switch ASA to plavix - Start lipitor 48m given new CVA and LDL > 100 - Defer diagnostics as recently had work up - keppra 507mBID  HTN/HFpEF: Isolated systolic HTN will limit improvements in systolic BP with medications.  - Continue BiDil  - Holding labetalol due to sinus bradycardia. Would consider alternative agent, but defer decision to primary care provider - Has hydral 7m83mV SBP>220 for permissive HTN in the setting of new acute infarct   CKD: GFR currently > 60.    Sinus bradycardia: Stable off beta blocker. TSH 0.491 in April.  - Continue to monitor on telemetry  History of T2DM: No longer on medications as Hb A1c 5.3 in March. Not hypoglycemic on arrival.  - CBG check qAM  Dyslipidemia: Not on home medication despite history of CVA. LDL now 138 - Start lipitor 48m28mRefractory hypokalemia w/ mild  hypomagnesemia:  - Mag was low at 1.6 s/p 1g mag, still refractory low K. Will give mag 2g, KDUR 48mE27md recheck in AM  Crohn's disease: well controlled. Sees [facility] - continue home vitamins   Anemia: Stable. Related to chronic disease.  - Monitor CBC - Continue home vitamins for crohn's disease including iron once taking PO  FEN/GI: dysphagia 3 diet; saline lock IV Prophylaxis: Subcutaneous heparin  Social: Primary care taker is her grandChristeen DouglasyTommi Rumps-[phone] is a NurseDesigner, jewelleryely to return home.   Disposition: Given continued relative somnolence, likely discharge home 6/20, awaiting PT recs  Subjective:  Patient more alert without complaints. Not back to baseline level of arousal per family. No further twitching. Getting up to bedside commode with assistance.   Objective: Temp:  [97.6 F (36.4 C)-98.7 F (37.1 C)] 98.7 F (37.1 C) (06/19 1007) Pulse Rate:  [55-60] 58 (06/19 1007) Resp:  [15-18] 18 (06/19 1007) BP: (155-225)/(45-87) 158/46 mmHg (06/19 1007) SpO2:  [97 %-100 %] 100 % (06/19 1007) Physical Exam: General: sleeping, rousable elderly female Cardiovascular: RRR, no m/r/g noted. No LE edema noted. Respiratory: CTAB without w/r/c. No increased WOB Abdomen: +BS, soft, ND/NT Extremities: No gross deformities  Neuro: Able to answer yes or no questions. Easily arousable but intermittently closes her eyes and is distracted. Facial movements symmetric.  5/5 strength in the UE and LE b/l.   Laboratory:  Recent Labs Lab 09/12/14 0853 09/12/14 0920 09/13/14 0550  WBC 8.7  --  10.1  HGB 9.3* 10.2* 10.4*  HCT 27.1* 30.0* 29.9*  PLT 251  --  304    Recent Labs  Lab 09/12/14 0853  09/13/14 0550 09/13/14 1620 09/14/14 0550  NA 139  < > 139 138 139  K 3.0*  < > 2.5* 3.2* 3.2*  CL 106  < > 100* 104 105  CO2 24  --  26 25 24   BUN 6  < > 5* <5* <5*  CREATININE 1.35*  < > 1.01* 0.94 0.94  CALCIUM 10.0  --  10.0 9.9 10.3  PROT  6.3*  --   --   --   --   BILITOT 0.4  --   --   --   --   ALKPHOS 53  --   --   --   --   ALT 9*  --   --   --   --   AST 15  --   --   --   --   GLUCOSE 142*  < > 135* 154* 120*  < > = values in this interval not displayed. Risk Stratification Labs  TSH    Component Value Date/Time   TSH 0.491 07/05/2014 0600   Hemoglobin A1C    Component Value Date/Time   HGBA1C 5.3 06/20/2014 0424   Lipid Panel     Component Value Date/Time   CHOL 213* 09/14/2014 0550   TRIG 200* 09/14/2014 0550   HDL 35* 09/14/2014 0550   CHOLHDL 6.1 09/14/2014 0550   VLDL 40 09/14/2014 0550   LDLCALC 138* 09/14/2014 0550   U/A: negative for LE and nitrite UDS: negative    Imaging/Diagnostic Tests: CT brain: 1. No acute intracranial findings. 2. Acute right maxillary sinusitis and chronic ethmoid sinusitis. 3. The known prior left thalamic infarct is not well seen on today's CT.  CXR: No active disease.  MRI brain:  1. Subtle 6 mm focus of restricted diffusion within the left middle cerebellar peduncle, suspicious for small acute ischemic infarct. No associated hemorrhage or mass effect. 2. 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 There is an additional focus of encephalomalacia slightly more superiorly within the anterior inferior right frontal lobe. Query remote trauma. 3. Atrophy with mild chronic small vessel ischemic disease. 4. Chronic right maxillary sinus disease.  Patrecia Pour, MD 09/14/2014, 11:16 AM PGY-2, [facility] Intern pager: [phone], text pages welcome

## 2014-09-15 ENCOUNTER — Ambulatory Visit: Payer: Medicare Other | Admitting: Neurology

## 2014-09-15 DIAGNOSIS — I69398 Other sequelae of cerebral infarction: Secondary | ICD-10-CM | POA: Diagnosis not present

## 2014-09-15 DIAGNOSIS — E876 Hypokalemia: Secondary | ICD-10-CM

## 2014-09-15 DIAGNOSIS — R404 Transient alteration of awareness: Secondary | ICD-10-CM | POA: Diagnosis not present

## 2014-09-15 DIAGNOSIS — R569 Unspecified convulsions: Secondary | ICD-10-CM | POA: Diagnosis not present

## 2014-09-15 DIAGNOSIS — I6340 Cerebral infarction due to embolism of unspecified cerebral artery: Secondary | ICD-10-CM | POA: Diagnosis not present

## 2014-09-15 DIAGNOSIS — E119 Type 2 diabetes mellitus without complications: Secondary | ICD-10-CM

## 2014-09-15 DIAGNOSIS — I5032 Chronic diastolic (congestive) heart failure: Secondary | ICD-10-CM

## 2014-09-15 LAB — BASIC METABOLIC PANEL
Anion gap: 9 (ref 5–15)
BUN: <5 mg/dL — ABNORMAL LOW (ref 6–20)
CO2: 23 mmol/L (ref 22–32)
Calcium: 10 mg/dL (ref 8.9–10.3)
Chloride: 104 mmol/L (ref 101–111)
Creatinine, Ser: 1 mg/dL (ref 0.44–1.00)
GFR calc Af Amer: >60 mL/min (ref >60)
GFR calc non Af Amer: 55 mL/min — ABNORMAL LOW (ref >60)
GLUCOSE: 141 mg/dL — AB (ref 65–99)
POTASSIUM: 3.4 mmol/L — AB (ref 3.5–5.1)
SODIUM: 136 mmol/L (ref 135–145)

## 2014-09-15 LAB — GLUCOSE, CAPILLARY: GLUCOSE-CAPILLARY: 139 mg/dL — AB (ref 65–99)

## 2014-09-15 LAB — MAGNESIUM: MAGNESIUM: 1.9 mg/dL (ref 1.7–2.4)

## 2014-09-15 MED ORDER — LEVETIRACETAM 500 MG PO TABS
500.0000 mg | ORAL_TABLET | Freq: Two times a day (BID) | ORAL | Status: DC
  Administered 2014-09-15: 500 mg via ORAL
  Filled 2014-09-15: qty 1

## 2014-09-15 MED ORDER — POTASSIUM CHLORIDE CRYS ER 20 MEQ PO TBCR: 20.0000 meq | EXTENDED_RELEASE_TABLET | Freq: Two times a day (BID) | ORAL | Status: DC

## 2014-09-15 MED ORDER — POTASSIUM CHLORIDE CRYS ER 20 MEQ PO TBCR
40.0000 meq | EXTENDED_RELEASE_TABLET | Freq: Once | ORAL | Status: AC
  Administered 2014-09-15: 40 meq via ORAL
  Filled 2014-09-15: qty 2

## 2014-09-15 NOTE — Care Management Note (Signed)
Case Management Note  Patient Details  Name: Michelle Farrell MRN: [id number] Date of Birth: [birth year]  Subjective/Objective:                    Action/Plan: Met with patient to verify that [facility] can be notified to resume home health services.  Voicemail was left for [facility] with Arville Go to notify of plans to discharge patient home later today.  Expected Discharge Date:                  Expected Discharge Plan:  [place]  In-House Referral:     Discharge planning Services  CM Consult  Post Acute Care Choice:    Choice offered to:  Patient  DME Arranged:    DME Agency:     HH Arranged:  PT, OT, RN, Speech Therapy HH Agency:  [facility] (Patient was active for PT/OT/RN/SLP prior to admission)  Status of Service:  Completed, signed off  Medicare Important Message Given:  N/A - LOS <3 / Initial given by admissions Date Medicare IM Given:    Medicare IM give by:    Date Additional Medicare IM Given:    Additional Medicare Important Message give by:     If discussed at [place] of Stay Meetings, dates discussed:    Additional Comments:  Rolm Baptise, RN 09/15/2014, 10:36 AM

## 2014-09-15 NOTE — Discharge Instructions (Signed)
You were cared for by the [facility] Medicine Teaching Service and the neurohospitalists for new seizures that resulted from having a prior stroke and taking tramadol. You should never take tramadol again.   To treat the seizures you should take keppra 567m twice a day.  Because your cholesterol is high (LDL 138) with a history of stroke you should take lipitor 435meveryday.  You should switch from aspirin to plavix: STOP taking aspirin and START taking plavix (clopidogrel) 7554mveryday. Follow up as soon as possible with your primary care provider to discuss management of your blood pressure. Please do not take the labetalol on discharge and follow up with your PCP concerning whether or not to re-start this.   If you or your family notice changes in mental status, confusion, lethargy, trouble walking or speaking, or a facial droop you should contact EMS right away.   We hope you feel better soon,  - Family Medicine Teaching Service

## 2014-09-15 NOTE — Progress Notes (Signed)
Family Medicine Teaching Service Daily Progress Note Intern Pager: [phone]  Patient name: Michelle Farrell Medical record number: [id number] Date of birth: [birth year] Age: 74 y.o. Gender: female  Primary Care Provider: Yong Channel, MD Consultants: Neurology  Code Status: Full per discussion on admission   Pt Overview and Major Events to Date:  6/17: admitted with decreased alertness and intermittent R facial twitching. Keppra load. 6/19: still somewhat drowsy  Assessment and Plan: Michelle Farrell is a 74 y.o. female presenting with decreased alertness and intermittent right facial twitching in the setting of remote cortical infarct and recently taking tramadol consistent with partial seizures. PMH is significant for HTN, DM, diastolic HF, CKD, crohn's disease.   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 Waxing/waning diminished arousal without focal deficits and no metabolic cause. MRI brain w/o contrast revealed a possible acute infarct in the left middle cerebellar peduncle - Switch ASA to plavix on 6/19  - Lipitor 108m given new CVA and LDL > 100 - Defer diagnostics as recently had work up - keppra 5025mBID - PT recs: HH-PT and 24hr supervision (per pt, she's been living with granddaughter since previous stroke). - SLP recs: Dysphagia 3, however f/u as pt is more alert now. - Awaiting OT recs  HTN/HFpEF: Isolated systolic HTN will limit improvements in systolic BP with medications.  - Continue BiDil  - Holding labetalol due to sinus bradycardia. Would consider alternative agent, but defer decision to primary care provider - Continue to monitor   CKD: GFR currently > 60.  - avoid nephrotoxic agents   Sinus bradycardia: Stable off beta blocker. TSH 0.491 in April.  - Continue to monitor on telemetry  History of T2DM: No longer on medications as Hb A1c 5.3 in March. Not hypoglycemic on  arrival.  - CBG check qAM  Dyslipidemia: Not on home medication despite history of CVA. LDL now 138 - Start lipitor 4029m Refractory hypokalemia w/ mild hypomagnesemia:  - Mag was low at 1.6 s/p 1g mag, still refractory low K. Patient received mag 2g yesterday and KDUR 32m30m K 3.4 today. - Mag 1.9  - KDUR 32mE36mis AM  Crohn's disease: well controlled. Seen by [facility] - continue home vitamins   Anemia: Stable. Related to chronic disease.  - Monitor CBC - Continue home vitamins for crohn's disease including iron once taking PO  FEN/GI: dysphagia 3 diet; saline lock IV Prophylaxis: Subcutaneous heparin  Social: Primary care taker is her grandChristeen DouglasyTommi Farrell-[phone] is a NurseDesigner, jewellerysposition: Likely discharge 6/20. Will discuss with granddaughter   Subjective:  Patient doing well. Feels more alert. Not eating well because she does not like food. No facial twitching.  Objective: Temp:  [97.8 F (36.6 C)-98.7 F (37.1 C)] 98.4 F (36.9 C) (06/20 0553) Pulse Rate:  [58-64] 62 (06/20 0553) Resp:  [17-18] 18 (06/20 0553) BP: (103-182)/(46-68) 182/64 mmHg (06/20 0553) SpO2:  [97 %-100 %] 100 % (06/20 0553) Physical Exam: General: sitting up in bed, alert.  Cardiovascular: RRR, no m/r/g noted. No LE edema noted. Respiratory: CTAB without w/r/c. No increased WOB Abdomen: +BS, soft, ND/NT Extremities: No gross deformities  Neuro: More alert this AM, answers questions in full sentences appropriately.  Facial movements symmetric. 5/5 strength in the UE and LE b/l. Sensation grossly intact.  Laboratory:  Recent Labs Lab 09/12/14 0853 09/12/14 0920 09/13/14 0550  WBC 8.7  --  10.1  HGB 9.3* 10.2* 10.4*  HCT 27.1* 30.0* 29.9*  PLT 251  --  304    Recent Labs Lab 09/12/14 0853  09/13/14 1620 09/14/14 0550 09/15/14 0635  NA 139  < > 138 139 136  K 3.0*  < > 3.2* 3.2* 3.4*  CL 106  < > 104 105 104  CO2 24  < > 25 24 23   BUN 6  <  > <5* <5* <5*  CREATININE 1.35*  < > 0.94 0.94 1.00  CALCIUM 10.0  < > 9.9 10.3 10.0  PROT 6.3*  --   --   --   --   BILITOT 0.4  --   --   --   --   ALKPHOS 53  --   --   --   --   ALT 9*  --   --   --   --   AST 15  --   --   --   --   GLUCOSE 142*  < > 154* 120* 141*  < > = values in this interval not displayed. Risk Stratification Labs  TSH    Component Value Date/Time   TSH 0.491 07/05/2014 0600   Hemoglobin A1C    Component Value Date/Time   HGBA1C 5.3 06/20/2014 0424   Lipid Panel     Component Value Date/Time   CHOL 213* 09/14/2014 0550   TRIG 200* 09/14/2014 0550   HDL 35* 09/14/2014 0550   CHOLHDL 6.1 09/14/2014 0550   VLDL 40 09/14/2014 0550   LDLCALC 138* 09/14/2014 0550   U/A: negative for LE and nitrite UDS: negative    Imaging/Diagnostic Tests: CT brain: 1. No acute intracranial findings. 2. Acute right maxillary sinusitis and chronic ethmoid sinusitis. 3. The known prior left thalamic infarct is not well seen on today's CT.  CXR: No active disease.  MRI brain:  1. Subtle 6 mm focus of restricted diffusion within the left middle cerebellar peduncle, suspicious for small acute ischemic infarct. No associated hemorrhage or mass effect. 2. 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 There is an additional focus of encephalomalacia slightly more superiorly within the anterior inferior right frontal lobe. Query remote trauma. 3. Atrophy with mild chronic small vessel ischemic disease. 4. Chronic right maxillary sinus disease.  Archie Patten, MD 09/15/2014, 9:04 AM PGY-1, [place] Intern pager: [phone], text pages welcome

## 2014-09-15 NOTE — Progress Notes (Signed)
Physical Therapy Treatment Patient Details Name: Michelle Farrell MRN: [id number] DOB: [birth year] Today's Date: 09/15/2014    History of Present Illness 74 y.o. female presenting with decreased alertness and intermittent right facial twitching. EEG without conclusive results, no new infarct detected.  PMH is significant for HTN, DM, diastolic HF, CKD, crohn's disease. MR head indicated on 09/13/14 Subtle 6 mm focus of restricted diffusion within the left middle cerebellar peduncle, suspicious for small acute ischemic infarct.     PT Comments    Patient seen for mobility, decreased awareness of safety during session. Ambulated without device but noted to have multiple LOB requiring increased assist to prevent fall. Poor ability to self correct. Provided RW for further ambulation, improved stability but required continued cues for safety and positioning. Will continue to see and progress as tolerated. Continue to recommend 24/7 supervision/assist (states granddaughter will provide this) and HHPT.  Follow Up Recommendations  Home health PT;Supervision/Assistance - 24 hour     Equipment Recommendations  None recommended by PT    Recommendations for Other Services       Precautions / Restrictions Precautions Precautions: Fall Restrictions Weight Bearing Restrictions: No    Mobility  Bed Mobility Overal bed mobility: Modified Independent             General bed mobility comments: supine to sit with increased time  Transfers Overall transfer level: Needs assistance Equipment used: None Transfers: Sit to/from Stand Sit to Stand: Supervision         General transfer comment: slightly unsteady upon rising into standing  Ambulation/Gait Ambulation/Gait assistance: Min guard;Min assist Ambulation Distance (Feet): 160 Feet Assistive device: None (50 ft with rw) Gait Pattern/deviations: Decreased stance time - right;Decreased step length - right;Trunk flexed;Drifts  right/left Gait velocity: decreased Gait velocity interpretation: Below normal speed for age/gender General Gait Details: 3 noted LOB without device, assist to stabilize, min guard with use of RW, VCs for safety during mobility, patient required cues for positioning within RW during activity   Stairs            Wheelchair Mobility    Modified Rankin (Stroke Patients Only)       Balance Overall balance assessment: Needs assistance Sitting-balance support: Feet supported Sitting balance-Leahy Scale: Fair Sitting balance - Comments: sat EOB to perform AROM LEs   Standing balance support: No upper extremity supported Standing balance-Leahy Scale: Fair                      Cognition Arousal/Alertness: Awake/alert Behavior During Therapy: Flat affect Overall Cognitive Status: Within Functional Limits for tasks assessed                      Exercises      General Comments General comments (skin integrity, edema, etc.): patient with very flat affect during mobility and therapy session. Cued for safety throughout session. will need supervision/assist upon discharge.      Pertinent Vitals/Pain Pain Assessment: No/denies pain    Home Living               Home Equipment: Walker - 2 wheels;Cane - single point      Prior Function            PT Goals (current goals can now be found in the care plan section) Acute Rehab PT Goals Patient Stated Goal: rest PT Goal Formulation: With patient/family Time For Goal Achievement: 09/20/14 Potential to Achieve Goals: Good Progress towards PT goals: Progressing  toward goals    Frequency  Min 3X/week    PT Plan Current plan remains appropriate    Co-evaluation             End of Session Equipment Utilized During Treatment: Gait belt Activity Tolerance: Patient limited by fatigue Patient left: in chair;with call bell/phone within reach;with chair alarm set;with family/visitor present      Time: 0725-0746 PT Time Calculation (min) (ACUTE ONLY): 21 min  Charges:  $Gait Training: 8-22 mins                    G CodesDuncan Dull Sep 18, 2014, 9:03 AM Alben Deeds, PT DPT  [phone]

## 2014-09-15 NOTE — Progress Notes (Signed)
Discharge instruction reviewed with patient/family. All questions answered at this time. Transport by family.    Ave Filter, RN

## 2014-09-15 NOTE — Progress Notes (Signed)
Speech Language Pathology Treatment: Dysphagia  Patient Details Name: Michelle Farrell MRN: [id number] DOB: [birth year] Today's Date: 09/15/2014 Time: 3606-7703 SLP Time Calculation (min) (ACUTE ONLY): 12 min  Assessment / Plan / Recommendation Clinical Impression  Patient making great progress with functional goals. Diagnostic po trials provided. Patient with improved alertness. Able to self feed regular texture solids without overt indication of dysphagia or aspiration. Oral phase mildly prolonged but function due to missing dentition. Patient independent with use of general safe swallowing precautions. No skilled SLP f/u for dysphagia indicated. Will advance diet.    HPI Other Pertinent Information: 74 y.o. female presenting with decreased alertness and intermittent right facial twitching in the setting of remote cortical infarct and recently taking tramadol consistent with partial seizures. PMH is significant for HTN, DM, diastolic HF, CKD, crohn's disease.    Pertinent Vitals Pain Assessment: No/denies pain  SLP Plan  All goals met    Recommendations Diet recommendations: Regular;Thin liquid Liquids provided via: Cup;Straw Medication Administration: Whole meds with liquid Supervision: Patient able to self feed Compensations: Slow rate;Small sips/bites Postural Changes and/or Swallow Maneuvers: Seated upright 90 degrees              Oral Care Recommendations: Oral care BID Follow up Recommendations: None Plan: All goals met    [place] MA, CCC-SLP [phone]   Ladona Rosten Meryl 09/15/2014, 9:02 AM

## 2014-09-15 NOTE — Evaluation (Signed)
Occupational Therapy Evaluation Patient Details Name: Grant Henkes MRN: [id number] DOB: [birth year] Today's Date: 09/15/2014    History of Present Illness 74 y.o. female presenting with decreased alertness and intermittent right facial twitching. EEG without conclusive results, no new infarct detected.  PMH is significant for HTN, DM, diastolic HF, CKD, crohn's disease. MR head indicated on 09/13/14 Subtle 6 mm focus of restricted diffusion within the left middle cerebellar peduncle, suspicious for small acute ischemic infarct.    Clinical Impression   PT admitted with L middle cerebellar peduncle. Pt currently with functional limitiations due to the deficits listed below (see OT problem list). PTA living alone mod I Pt will benefit from skilled OT to increase their independence and safety with adls and balance to allow discharge [place].     Follow Up Recommendations  Home health OT (inital d/c home needs incr support )    Equipment Recommendations  None recommended by OT    Recommendations for Other Services       Precautions / Restrictions Precautions Precautions: Fall Restrictions Weight Bearing Restrictions: No      Mobility Bed Mobility Overal bed mobility: Modified Independent             General bed mobility comments: sit <>Supine  Transfers Overall transfer level: Needs assistance Equipment used: Rolling walker (2 wheeled) Transfers: Sit to/from Stand Sit to Stand: Supervision         General transfer comment: cues for safety and RW use. Pt walking outside RW    Balance Overall balance assessment: Needs assistance Sitting-balance support: Feet supported Sitting balance-Leahy Scale: Fair Sitting balance - Comments: sat EOB to perform AROM LEs   Standing balance support: No upper extremity supported;During functional activity Standing balance-Leahy Scale: Fair                              ADL Overall ADL's : Needs  assistance/impaired     Grooming: Wash/dry face;Min guard;Standing   Upper Body Bathing: Min guard;Standing   Lower Body Bathing: Min guard;Sit to/from stand   Upper Body Dressing : Min guard;Standing   Lower Body Dressing: Min guard;Sit to/from stand Lower Body Dressing Details (indicate cue type and reason): undershooting don of mesh panties and required extended time. pt with incr effort for the R side more than L side.  Pt able to cross bil LE Toilet Transfer: Minimal assistance;Ambulation Toilet Transfer Details (indicate cue type and reason): abandoned RW and holding onto environmental supports to furniture walk across bathroom Toileting- Clothing Manipulation and Hygiene: Minimal assistance;Sit to/from stand Toileting - Clothing Manipulation Details (indicate cue type and reason): cues to doff to void bladder     Functional mobility during ADLs: Minimal assistance General ADL Comments: Pt abandoned RW throughout session and prefers to furniture walk. pt unable to ambulate without holding onto something in environment or leaning to reaching something in environment. Pt demonstrates high fall risk. pt with flat affect and minimal verbalizations     Vision     Perception     Praxis      Pertinent Vitals/Pain Pain Assessment: Faces Faces Pain Scale: Hurts little more Pain Location: R rib area Pain Intervention(s): Monitored during session;Repositioned     Hand Dominance Right   Extremity/Trunk Assessment Upper Extremity Assessment Upper Extremity Assessment: Overall WFL for tasks assessed   Lower Extremity Assessment Lower Extremity Assessment: Defer to PT evaluation   Cervical / Trunk Assessment Cervical / Trunk Assessment: Kyphotic  Communication Communication Communication: No difficulties   Cognition Arousal/Alertness: Awake/alert Behavior During Therapy: Flat affect Overall Cognitive Status: Impaired/Different from baseline Area of Impairment:  Memory;Awareness;Problem solving     Memory: Decreased short-term memory     Awareness: Anticipatory Problem Solving: Slow processing;Decreased initiation General Comments: pt at sink abandons RW and ambulates to toilet. pt sitting on toilet and voiding with mesh panties don. OT max cues to patient for doff mesh panties. Pt states"oh yeah I forget those things" Pt needed cues for sequence to bathing this AM. pt required reading bottle even after she had used the bottle several times. Pt needed help to sequence don LOB. Pt undershooting don of mesh panties x4   General Comments       Exercises       Shoulder Instructions      Home Living Family/patient expects to be discharged to:: Private residence Living Arrangements: Children;Other relatives Available Help at Discharge: Available PRN/intermittently;Family Type of Home: House Home Access: Stairs to enter [employer] of Steps: 3   Home Layout: One level     Bathroom Shower/Tub: Teacher, early years/pre: Standard     Home Equipment: Environmental consultant - 2 wheels;Cane - single point   Additional Comments: lives alone and daughter checks on       Prior Functioning/Environment Level of Independence: Independent        Comments: has RW and cane but doesn't use per daughter    OT Diagnosis: Generalized weakness;Cognitive deficits   OT Problem List: Decreased strength;Decreased range of motion;Decreased activity tolerance;Impaired balance (sitting and/or standing);Decreased cognition;Decreased safety awareness;Decreased knowledge of use of DME or AE;Decreased knowledge of precautions;Pain   OT Treatment/Interventions: Self-care/ADL training;Therapeutic exercise;DME and/or AE instruction;Therapeutic activities;Cognitive remediation/compensation;Patient/family education;Balance training    OT Goals(Current goals can be found in the care plan section) Acute Rehab OT Goals Patient Stated Goal: none stated OT Goal  Formulation: Patient unable to participate in goal setting Potential to Achieve Goals: Good  OT Frequency: Min 2X/week   Barriers to D/C: Decreased caregiver support          Co-evaluation              End of Session Equipment Utilized During Treatment: Rolling walker Nurse Communication: Mobility status;Precautions  Activity Tolerance: Patient tolerated treatment well Patient left: in bed;with call bell/phone within reach;with bed alarm set   Time: 0802-0827 OT Time Calculation (min): 25 min Charges:  OT General Charges $OT Visit: 1 Procedure OT Evaluation $Initial OT Evaluation Tier I: 1 Procedure OT Treatments $Self Care/Home Management : 8-22 mins G-Codes:    Peri Maris 10-12-2014, 9:28 AM Pager: [phone]

## 2014-10-02 ENCOUNTER — Other Ambulatory Visit: Payer: Self-pay

## 2014-10-02 NOTE — Patient Outreach (Signed)
[place] [facility]) Care Management  10/02/2014  Michelle Farrell 1941-01-13 [id number]   Telephone call to patient regarding EMMI stroke transition program.  Unable to speak with patient.  Person answering phone states patient is resting and request call back .  HIPAA compliant message left.   PLAN: RNCM will attempt 2nd telephone call to patient within 2 business days.   Quinn Plowman RN,BSN,CCM [place] Coordinator [phone]

## 2014-10-03 ENCOUNTER — Other Ambulatory Visit: Payer: Medicare Other

## 2014-10-03 NOTE — Patient Outreach (Signed)
[place] [facility]) Care Management  10/03/2014  Michelle Farrell Dec 18, 1940 [id number]   Second telephone outreach attempt to patient regarding EMMI stroke transition program.  Unable to reach patient. HIPAA compliant voice message left with call back phone number.   PLAN: RNCM will attempt 3rd telephone outreach to patient within 1 week.  Quinn Plowman RN,BSN,CCM [place] Coordinator [phone]

## 2014-10-08 ENCOUNTER — Other Ambulatory Visit: Payer: Medicare Other

## 2014-10-08 NOTE — Patient Outreach (Signed)
[place] [facility]) Care Management  10/08/2014  Abbagale Goguen 1940-11-05 [id number]    Third telephone call to patient regarding EMMI stroke transition program.  Unable to reach patient.  HIPAA compliant voice message left with call back phone number.   PLAN: RNCM will send patient outreach letter to attempt contact.   Quinn Plowman RN,BSN,CCM [place] Coordinator [phone]

## 2014-10-11 ENCOUNTER — Other Ambulatory Visit: Payer: Self-pay | Admitting: Family Medicine

## 2014-10-13 ENCOUNTER — Ambulatory Visit (INDEPENDENT_AMBULATORY_CARE_PROVIDER_SITE_OTHER): Payer: Medicare Other | Admitting: Neurology

## 2014-10-13 ENCOUNTER — Encounter: Payer: Self-pay | Admitting: Neurology

## 2014-10-13 ENCOUNTER — Other Ambulatory Visit: Payer: Self-pay | Admitting: Family Medicine

## 2014-10-13 VITALS — BP 104/61 | HR 58 | Ht 67.0 in | Wt 146.6 lb

## 2014-10-13 DIAGNOSIS — G4710 Hypersomnia, unspecified: Secondary | ICD-10-CM | POA: Diagnosis not present

## 2014-10-13 DIAGNOSIS — I6381 Other cerebral infarction due to occlusion or stenosis of small artery: Secondary | ICD-10-CM

## 2014-10-13 DIAGNOSIS — I639 Cerebral infarction, unspecified: Secondary | ICD-10-CM

## 2014-10-13 DIAGNOSIS — G40109 Localization-related (focal) (partial) symptomatic epilepsy and epileptic syndromes with simple partial seizures, not intractable, without status epilepticus: Secondary | ICD-10-CM

## 2014-10-13 MED ORDER — LEVETIRACETAM 500 MG PO TABS: 500.0000 mg | ORAL_TABLET | Freq: Two times a day (BID) | ORAL | Status: DC

## 2014-10-13 NOTE — Patient Instructions (Signed)
I had a long d/w patient and grand daughter about her recent stroke, risk for recurrent stroke/TIAs, personally independently reviewed imaging studies and stroke evaluation results and answered questions.Continue Plavix  for secondary stroke prevention and maintain strict control of hypertension with blood pressure goal below 130/90, diabetes with hemoglobin A1c goal below 6.5% and lipids with LDL cholesterol goal below 100 mg/dL. I also advised the patient to eat a healthy diet with plenty of whole grains, cereals, fruits and vegetables, exercise regularly and maintain ideal body weight .I advised the patient to reduced her dose of Keppra to 250 mg in the morning and continue find her milligrams at night due to her excessive daytime sleepiness. Check EEG to rule out any silent seizures.  . Followup in the future with me in   2 months or call earlier if necessary. Epilepsy Epilepsy is a disorder in which a person has repeated seizures over time. A seizure is a release of abnormal electrical activity in the brain. Seizures can cause a change in attention, behavior, or the ability to remain awake and alert (altered mental status). Seizures often involve uncontrollable shaking (convulsions).  Most people with epilepsy lead normal lives. However, people with epilepsy are at an increased risk of falls, accidents, and injuries. Therefore, it is important to begin treatment right away. CAUSES  Epilepsy has many possible causes. Anything that disturbs the normal pattern of brain cell activity can lead to seizures. This may include:   Head injury.  Birth trauma.  High fever as a child.  Stroke.  Bleeding into or around the brain.  Certain drugs.  Prolonged low oxygen, such as what occurs after CPR efforts.  Abnormal brain development.  Certain illnesses, such as meningitis, encephalitis (brain infection), malaria, and other infections.  An imbalance of nerve signaling chemicals (neurotransmitters).   SIGNS AND SYMPTOMS  The symptoms of a seizure can vary greatly from one person to another. Right before a seizure, you may have a warning (aura) that a seizure is about to occur. An aura may include the following symptoms:  Fear or anxiety.  Nausea.  Feeling like the room is spinning (vertigo).  Vision changes, such as seeing flashing lights or spots. Common symptoms during a seizure include:  Abnormal sensations, such as an abnormal smell or a bitter taste in the mouth.   Sudden, general body stiffness.   Convulsions that involve rhythmic jerking of the face, arm, or leg on one or both sides.   Sudden change in consciousness.   Appearing to be awake but not responding.   Appearing to be asleep but cannot be awakened.   Grimacing, chewing, lip smacking, drooling, tongue biting, or loss of bowel or bladder control. After a seizure, you may feel sleepy for a while. DIAGNOSIS  Your health care provider will ask about your symptoms and take a medical history. Descriptions from any witnesses to your seizures will be very helpful in the diagnosis. A physical exam, including a detailed neurological exam, is necessary. Various tests may be done, such as:   An electroencephalogram (EEG). This is a painless test of your brain waves. In this test, a diagram is created of your brain waves. These diagrams can be interpreted by a specialist.  An MRI of the brain.   A CT scan of the brain.   A spinal tap (lumbar puncture, LP).  Blood tests to check for signs of infection or abnormal blood chemistry. TREATMENT  There is no cure for epilepsy, but it is  generally treatable. Once epilepsy is diagnosed, it is important to begin treatment as soon as possible. For most people with epilepsy, seizures can be controlled with medicines. The following may also be used:  A pacemaker for the brain (vagus nerve stimulator) can be used for people with seizures that are not well controlled by  medicine.  Surgery on the brain. For some people, epilepsy eventually goes away. HOME CARE INSTRUCTIONS   Follow your health care provider's recommendations on driving and safety in normal activities.  Get enough rest. Lack of sleep can cause seizures.  Only take over-the-counter or prescription medicines as directed by your health care provider. Take any prescribed medicine exactly as directed.  Avoid any known triggers of your seizures.  Keep a seizure diary. Record what you recall about any seizure, especially any possible trigger.   Make sure the people you live and work with know that you are prone to seizures. They should receive instructions on how to help you. In general, a witness to a seizure should:   Cushion your head and body.   Turn you on your side.   Avoid unnecessarily restraining you.   Not place anything inside your mouth.   Call for emergency medical help if there is any question about what has occurred.   Follow up with your health care provider as directed. You may need regular blood tests to monitor the levels of your medicine.  SEEK MEDICAL CARE IF:   You develop signs of infection or other illness. This might increase the risk of a seizure.   You seem to be having more frequent seizures.   Your seizure pattern is changing.  SEEK IMMEDIATE MEDICAL CARE IF:   You have a seizure that does not stop after a few moments.   You have a seizure that causes any difficulty in breathing.   You have a seizure that results in a very severe headache.   You have a seizure that leaves you with the inability to speak or use a part of your body.  Document Released: 03/14/2005 Document Revised: 01/02/2013 Document Reviewed: 10/24/2012 [facility] Patient Information 2015 [place], [place]. This information is not intended to replace advice given to you by your health care provider. Make sure you discuss any questions you have with your health care  provider.

## 2014-10-13 NOTE — Progress Notes (Signed)
[facility] [address] [place]. [place] [address] [phone]       OFFICE CONSULT NOTE  [name] Date of Birth:  05-Feb-1941 Medical Record Number:  [id number]   Referring MD: Yong Channel Reason for Referral:  Seizure and stroke HPI: 57 year frail elderly African-American lady who is had 2 recent admissions to [facility] in March and June 2016. Patient is unable to provide history which is obtained from her granddaughter as well as review of electronic medical records. In March 2016 she was found unresponsive on the floor at home by her granddaughter. She she was admitted to [facility] where she was found to have evidence of urinary tract infection as well as sepsis. MRI scan of the brain showed a small left thalamic infarct which was felt not to be able to explain her neurological exam. She had a prolonged recovery and went to inpatient rehabilitation as well as [place] home and was quite weak and tired but able to ambulate. She was home barely few weeks while she was readmitted in June with again altered mental status. The granddaughter feels this may have been brought on by increasing dose of tramadol that she was taking for pain. She had a witnessed episode of seizure at that time which as per granddaughter's eyewitness account showed tonic movement in the right arm followed by clonic activity as well as head jerking and eyeballs rolling up lasting few minutes. She was started on Keppra 500 twice a day following this. EEG however was abnormal even in the first admission in March and Dr. Nicole Kindred read an EEG on 06/20/2014 showing periodic left sharp waves. Patient has continued to feel weak and lose weight overall. The last couple of weeks appetite has improved. 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 There is a tiny diffusion  positive lesion in the left middle cerebral peduncle which is artifact versus tiny infarct. Patient's granddaughter feels that she is quite sleepy during the day and this may be related to [place]. She is walking with a cane and has had no major falls. She's had no recurrent seizure episodes either. She is on Plavix for second stroke prevention she is tolerating well without bruising or bleeding and remains on Lipitor.  ROS:   14 system review of systems is positive for  weight loss, anemia, incontinence, excessive sleep, seizure, headache,seizure, balance difficulties and all other systems negative PMH:  Past Medical History  Diagnosis Date  . CHF (congestive heart failure)   . Hypertension   . Back pain, chronic   . Hip pain, chronic   . GERD (gastroesophageal reflux disease)   . Crohn disease   . Diabetes mellitus without complication   . Esophageal stricture     s/p dilation 2003, 2008  . PUD (peptic ulcer disease)   . CKD (chronic kidney disease)   . COPD (chronic obstructive pulmonary disease)   . Arthritis   . AKI (acute kidney injury)   . Sleep apnea     Social History:  History   Social History  . Marital Status: Widowed    Spouse Name: N/A  . Number of Children: N/A  . Years of Education: N/A   Occupational History  . Not on file.   Social History Main Topics  . Smoking status: Former Smoker -- 1.00 packs/day for 30 years    Types: Cigarettes  Quit date: 05/27/2014  . Smokeless tobacco: Never Used  . Alcohol Use: No  . Drug Use: No  . Sexual Activity: No   Other Topics Concern  . Not on file   Social History Narrative   24 oz of caffeine a day    Lives alone at home occasionally with granddaughter     Widowed and disabled     Medications:   Current Outpatient Prescriptions on File Prior to Visit  Medication Sig Dispense Refill  . acetaminophen (TYLENOL) 500 MG tablet Take 1,000 mg by mouth every 6 (six) hours as needed for mild pain or moderate  pain.    Marland Kitchen atorvastatin (LIPITOR) 40 MG tablet Take 1 tablet (40 mg total) by mouth daily at 6 PM. 30 tablet 0  . Cholecalciferol (VITAMIN D) 2000 UNITS CAPS Take 1 capsule by mouth daily.    . clopidogrel (PLAVIX) 75 MG tablet Take 1 tablet (75 mg total) by mouth daily. 30 tablet 0  . folic acid (FOLVITE) 1 MG tablet Take 1 mg by mouth daily.    . metoCLOPramide (REGLAN) 10 MG tablet Take 1 tablet (10 mg total) by mouth 3 (three) times daily as needed for nausea. 90 tablet 1  . ondansetron (ZOFRAN) 4 MG tablet Take 1 tablet (4 mg total) by mouth every 6 (six) hours as needed for nausea. 20 tablet 0  . pantoprazole (PROTONIX) 40 MG tablet Take 1 tablet (40 mg total) by mouth 2 (two) times daily. 30 min prior to breakfast and 30 min prior to supper 90 tablet 3  . traZODone (DESYREL) 100 MG tablet Take 100 mg by mouth at bedtime.    . vitamin B-12 1000 MCG tablet Take 1 tablet (1,000 mcg total) by mouth daily. 30 tablet 1  . isosorbide-hydrALAZINE (BIDIL) 20-37.5 MG per tablet Take 1 tablet by mouth 3 (three) times daily. (Patient not taking: Reported on 10/13/2014) 90 tablet 1  . PROAIR HFA 108 (90 BASE) MCG/ACT inhaler Inhale 1 puff into the lungs every 6 (six) hours as needed. Shortness of breath or wheezing  3   Current Facility-Administered Medications on File Prior to Visit  Medication Dose Route Frequency Provider Last Rate Last Dose  . sodium chloride 0.9 % injection 10-40 mL  10-40 mL Intracatheter PRN Ladene Artist, MD   10 mL at 07/09/14 0501    Allergies:   Allergies  Allergen Reactions  . Tramadol     Seizures     Physical Exam General: Frail elderly African-American lady, seated, in no evident distress Head: head normocephalic and atraumatic.   Neck: supple with no carotid or supraclavicular bruits Cardiovascular: regular rate and rhythm, no murmurs Musculoskeletal: no deformity Skin:  no rash/petichiae Vascular:  Normal pulses all extremities  Neurologic Exam Mental  Status: Awake and fully alert. Oriented to place and person only.. Recent and remote memory intact. Attention span, concentration and fund of knowledge diminished slightly.. Mood and affect appropriate. Diminished recall 2/3. Cranial Nerves: Fundoscopic exam reveals sharp disc margins. Pupils equal, briskly reactive to light. Extraocular movements full without nystagmus. Visual fields full to confrontation. Hearing intact. Facial sensation intact. Face, tongue, palate moves normally and symmetrically.  Motor: Normal bulk and tone. Normal strength in all tested extremity muscles. Diminished fine finger movements on the left. Orbits right over left approximately. Sensory.: intact to touch , pinprick , position and vibratory sensation.  Coordination: Rapid alternating movements normal in all extremities. Finger-to-nose and heel-to-shin performed accurately bilaterally. Gait and Station: [employer]  from chair without difficulty. Stance is normal. Gait demonstrates normal stride length and balance uses a cane for walking.. Able to heel, toe and tandem walk with slight difficulty.  Reflexes: 1+ and symmetric. Toes downgoing.   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 Vascular risk factors of hyperlipidemia, hypertension, CHF    PLAN: I had a long d/w patient and grand daughter about her recent stroke, risk for recurrent stroke/TIAs, personally independently reviewed imaging studies and stroke evaluation results and answered questions.Continue Plavix  for secondary stroke prevention and maintain strict control of hypertension with blood pressure goal below 130/90, diabetes with hemoglobin A1c goal below 6.5% and lipids with LDL cholesterol goal below 100 mg/dL. I also advised the  patient to eat a healthy diet with plenty of whole grains, cereals, fruits and vegetables, exercise regularly and maintain ideal body weight .I advised the patient to reduced her dose of Keppra to 250 mg in the morning and continue find her milligrams at night due to her excessive daytime sleepiness. Check EEG to rule out any silent seizures.  . Followup in the future with me in   2 months or call earlier if necessary Antony Contras, MD  Note: This document was prepared with digital dictation and possible smart phrase technology. Any transcriptional errors that result from this process are unintentional.

## 2014-10-13 NOTE — Progress Notes (Signed)
Late entry for missed G-code. Based on review of the evaluation and goals by Bernita Buffy, PT   09-26-14 1101  PT G-Codes **NOT FOR INPATIENT CLASS**  Functional Assessment Tool Used Clinical judgement based on review of medical record  Functional Limitation Mobility: Walking and moving around  Mobility: Walking and Moving Around Current Status [phone]) CI  Mobility: Walking and Moving Around Goal Status [phone]) CI  Lavonia Dana, [place]  [phone] 10/13/2014

## 2014-10-14 NOTE — Progress Notes (Signed)
OT NOTE- LATE GCODE ENTRY     10/03/2014 0906  OT G-codes **NOT FOR INPATIENT CLASS**  Functional Assessment Tool Used clinical judgement  Functional Limitation Self care  Self Care Current Status (B6384) CJ  Self Care Goal Status (T3646) CI  Self Care Discharge Status (O0321) Elsie Stain   OTR/L Pager: [phone] Office: [phone] .

## 2014-10-24 ENCOUNTER — Ambulatory Visit (INDEPENDENT_AMBULATORY_CARE_PROVIDER_SITE_OTHER): Payer: Medicare Other | Admitting: Neurology

## 2014-10-24 DIAGNOSIS — G4710 Hypersomnia, unspecified: Secondary | ICD-10-CM

## 2014-10-24 DIAGNOSIS — G40109 Localization-related (focal) (partial) symptomatic epilepsy and epileptic syndromes with simple partial seizures, not intractable, without status epilepticus: Secondary | ICD-10-CM | POA: Diagnosis not present

## 2014-10-24 DIAGNOSIS — I6381 Other cerebral infarction due to occlusion or stenosis of small artery: Secondary | ICD-10-CM

## 2014-10-24 NOTE — Procedures (Signed)
     History: Michelle Farrell is a 74 year old patient with a history of being found unresponsive in March 2016. 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 The patient had an observed episode of right-sided jerking in June 2016. She has been treated with Keppra. The patient is being evaluated for seizures.  This is a routine EEG. No skull defects are noted. Medications include Tylenol, Lipitor, vitamin D, Plavix, Coreg, iron supplementation, Flonase, folic acid, Keppra, Reglan, Zofran, Protonix, pro-air, Tradjenta, trazodone, and vitamin B12.  EEG classification: Dysrhythmia grade 1 left greater than right hemisphere  Description of the recording: The background rhythm of this recording consists of a fairly well modulated medium amplitude alpha rhythm that is reactive to eye opening and closure. As the record progresses, photic stimulation and hyperventilation were not performed. Episodically during the study, there appears to be episodes of sharp wave activity emanating from the parasagittal areas during periods of what appear to be drowsiness, but there is an associated asymmetric theta activity emanating from the left greater than right temporal regions. These episodes of slowing are seen intermittently throughout the recording. At no time during the study does there appear to be evidence of actual spike or spike-wave discharges. EKG monitor shows no evidence of cardiac rhythm abnormalities with a heart rate of 60.  Impression: This is an abnormal EEG recording secondary to episodic bihemispheric theta slowing that appears to be slightly more prominent in the left hemisphere than the right. This study suggests an underlying primarily left brain abnormality, no clear epileptiform discharges are seen, however.

## 2014-10-27 ENCOUNTER — Other Ambulatory Visit: Payer: Self-pay | Admitting: *Deleted

## 2014-10-27 ENCOUNTER — Telehealth: Payer: Self-pay

## 2014-10-27 MED ORDER — METOCLOPRAMIDE HCL 10 MG PO TABS: 10.0000 mg | ORAL_TABLET | Freq: Three times a day (TID) | ORAL | Status: DC | PRN

## 2014-10-27 NOTE — Telephone Encounter (Signed)
Either of the two is fine

## 2014-10-27 NOTE — Telephone Encounter (Signed)
Spoke with pts granddaughter and informed that Dr. Leonie Man suggest that Wellbutrin or Chantix would be fine.  I will fax over letter to PCP informing them

## 2014-10-27 NOTE — Telephone Encounter (Signed)
informed patients granddaughter  that EEG shows mild slowing of brain wave activity greater on the left which can be attributed to the prior stroke. No definite seizure or worrisome findings.  She verbalized understanding.  She states that patient has quit smoking but experiencing cravings; her PCP would like to start her on Wellbutrin or Chantix, but would like neurologist opinion on which is best.

## 2014-10-30 NOTE — Patient Outreach (Signed)
[facility]) Care Management  10/30/2014  Michelle Farrell 08-30-1940 [id number]   ASSESSMENT: No response from patient to 3 telephone calls and letter outreach.  PLAN: RNCM will refer to Lurline Del to close due to inability to establish contact.  RNCM will notify patients primary MD of closure.   Quinn Plowman RN,BSN,CCM [place] Coordinator [phone]

## 2014-11-05 NOTE — Progress Notes (Signed)
   09/13/14 1300  SLP Visit Information  SLP Received On 09/13/14  General Information  Date of Onset 09/12/14  Other Pertinent Information 74 y.o. female presenting with decreased alertness and intermittent right facial twitching in the setting of remote cortical infarct and recently taking tramadol consistent with partial seizures. PMH is significant for HTN, DM, diastolic HF, CKD, crohn's disease.   Type of Study Bedside swallow evaluation  Diet Prior to this Study NPO  Temperature Spikes Noted No  Respiratory Status Other (comment) ([place])  History of Recent Intubation No  Behavior/Cognition Alert;Cooperative  Oral Cavity - Dentition Edentulous  Self-Feeding Abilities Able to feed self;Needs assist  Patient Positioning Upright in bed  Baseline Vocal Quality Low vocal intensity  Volitional Cough Strong  Volitional Swallow Able to elicit  Oral Assessment (Complete on admission/transfer/change in patient condition)  Does patient have any of the following "high risk" factors? Nutritional status - fluids only or NPO for >24 hours  Does patient have any of the following "at risk" factors? None of the above  Patient is LOW RISK Follow universal precautions (see row information)  Oral Motor/Sensory Function  Overall Oral Motor/Sensory Function Appears within functional limits for tasks assessed  Ice Chips  Ice chips WFL  Thin Liquid  Thin Liquid WFL  Presentation Cup;Straw  Nectar Thick Liquid  Nectar Thick Liquid NT  Honey Thick Liquid  Honey Thick Liquid NT  Puree  Puree WFL  Presentation Self Fed  Solid  Solid Impaired  Presentation Self Fed  Oral Phase Impairments Impaired mastication  Oral Phase Functional Implications Oral residue;Other (comment) (cleared with subsequent swallow/liquids)  Pharyngeal Phase Impairments Multiple swallows  SLP - End of Session  Patient left in bed;with call bell/phone within reach  Nurse Communication Diet recommendation  Suspected  Esophageal Findings  Suspected Esophageal Findings Belching  SLP Assessment  Risk for Aspiration Mild  Swallow Evaluation Recommendations  SLP Diet Recommendations Dysphagia 3 (Mech soft);Thin  Liquid Administration via Cup;Straw  Medication Administration Other (Comment) (as tolerated)  Supervision Intermittent supervision to cue for compensatory strategies  Compensations Slow rate;Small sips/bites  Postural Changes Seated upright at 90 degrees  Treatment Plan  Oral Care Recommendations Oral care BID  Treatment Recommendations Therapy as outlined in treatment plan below  Speech Therapy Frequency (ACUTE ONLY) min 2x/week  Treatment Duration 1 week  Interventions Compensatory techniques;Trials of upgraded texture/liquids;Diet toleration management by SLP  Prognosis  Prognosis for Safe Diet Advancement Good  Individuals Consulted  Consulted and Agree with Results and Recommendations Patient;MD;RN  SLP Time Calculation  SLP Start Time (ACUTE ONLY) 1340  SLP Stop Time (ACUTE ONLY) 1400  SLP Time Calculation (min) (ACUTE ONLY) 20 min  SLP G-Codes **NOT FOR INPATIENT CLASS**  Functional Assessment Tool Used skilled clinical judgement via chart review  Functional Limitations Swallowing  Swallow Current Status (L9767) CI  Swallow Goal Status (H4193) [place]  SLP Evaluations  $ SLP Speech Visit 1 Procedure  SLP Evaluations  $BSS Swallow 1 Procedure  Gabriel Rainwater MA, CCC-SLP [phone]

## 2014-11-05 NOTE — Patient Outreach (Signed)
[facility]) Care Management  10/30/2014  Michelle Farrell 15-Mar-1941 [id number]   Notification from Quinn Plowman, RN to close case due to inability to establish contact with patient for [place] Management services.  Thanks, Ronnell Freshwater. [place], [place] Management [facility] CM Assistant Phone: [phone] Fax: [phone]'

## 2014-11-06 ENCOUNTER — Encounter: Payer: Self-pay | Admitting: Physician Assistant

## 2014-11-06 ENCOUNTER — Ambulatory Visit (INDEPENDENT_AMBULATORY_CARE_PROVIDER_SITE_OTHER): Payer: Medicare Other | Admitting: Physician Assistant

## 2014-11-06 VITALS — BP 108/68 | HR 84 | Ht 66.0 in | Wt 146.0 lb

## 2014-11-06 DIAGNOSIS — K2100 Gastro-esophageal reflux disease with esophagitis, without bleeding: Secondary | ICD-10-CM

## 2014-11-06 DIAGNOSIS — K5010 Crohn's disease of large intestine without complications: Secondary | ICD-10-CM | POA: Diagnosis not present

## 2014-11-06 DIAGNOSIS — K2990 Gastroduodenitis, unspecified, without bleeding: Secondary | ICD-10-CM

## 2014-11-06 DIAGNOSIS — K2970 Gastritis, unspecified, without bleeding: Secondary | ICD-10-CM

## 2014-11-06 NOTE — Progress Notes (Signed)
Reviewed and agree.

## 2014-11-06 NOTE — Progress Notes (Signed)
Patient ID: Michelle Farrell, female   DOB: [birth year], 74 y.o.   MRN: [id number]     History of Present Illness: Michelle Farrell is a pleasant 74 year old female with a history of diabetes, chronic hip and back pain, hypertension, unspecified heart failure, and well-controlled Crohn's disease. She had previously been on mesalamine and sulfasalazine but reports that she has been off those medications for years. She was last seen here in May. She had admissions in February and March of this year for symptoms of gastroenteritis including nausea, vomiting, and diarrhea. She was admitted on March 24 with fever and altered mental status. She was found to have a UTI and a CVA. She was evaluated by neurology and infectious disease, stabilized and was transferred to [facility] inpatient rehabilitation on April 1. On April 8 she was found to have acute renal failure and a vancomycin trough level of 47. She was noted to be bradycardic. She was started on IV fluids, her ACE inhibitor and spironolactone were discontinued and she had improvement of her kidney function. For her bradycardia her clonidine was discontinued and she was started on hydralazine. On April 10 she developed epigastric pain with intractable nausea and vomiting and was noted to have bilious and bloody emesis. She is readmitted to the hospital for further evaluation and underwent an EGD on April 12 which demonstrated esophagitis, gastritis, and duodenitis. She was advised to continue a twice daily PPI. At her last visit she was instructed to continue her twice a day PPI and use Reglan as needed. She is here today for follow-up and is feeling well. She is moving her bowels on a daily basis she states last week she had diarrhea for a couple of days but she thinks she had a stomach bug. She has had a formed bowel movement daily since then with no bright red blood per rectum or melena. She is not having any further epigastric pain. She is eating well. Her granddaughter  who accompanies her states she is even eating larger portions and has been more active and increasing her activity as well.    Past Medical History  Diagnosis Date  . CHF (congestive heart failure)   . Hypertension   . Back pain, chronic   . Hip pain, chronic   . GERD (gastroesophageal reflux disease)   . Crohn disease   . Diabetes mellitus without complication   . Esophageal stricture     s/p dilation 2003, 2008  . PUD (peptic ulcer disease)   . CKD (chronic kidney disease)   . COPD (chronic obstructive pulmonary disease)   . Arthritis   . AKI (acute kidney injury)   . Sleep apnea     Past Surgical History  Procedure Laterality Date  . Abdominal hysterectomy    . Tonsillectomy    . Cholecystectomy    . Bladder surgery    . Esophagogastroduodenoscopy N/A 07/08/2014    Procedure: ESOPHAGOGASTRODUODENOSCOPY (EGD);  Surgeon: Ladene Artist, MD;  Location: [facility] ENDOSCOPY;  Service: Endoscopy;  Laterality: N/A;   Family History  Problem Relation Age of Onset  . Ovarian cancer Maternal Grandmother    Social History  Substance Use Topics  . Smoking status: Former Smoker -- 1.00 packs/day for 30 years    Types: Cigarettes    Quit date: 05/27/2014  . Smokeless tobacco: Never Used  . Alcohol Use: No   Current Outpatient Prescriptions  Medication Sig Dispense Refill  . acetaminophen (TYLENOL) 500 MG tablet Take 1,000 mg by mouth every 6 (  six) hours as needed for mild pain or moderate pain.    Marland Kitchen atorvastatin (LIPITOR) 40 MG tablet Take 1 tablet (40 mg total) by mouth daily at 6 PM. 30 tablet 0  . Cholecalciferol (VITAMIN D) 2000 UNITS CAPS Take 1 capsule by mouth daily.    . clopidogrel (PLAVIX) 75 MG tablet Take 1 tablet (75 mg total) by mouth daily. 30 tablet 0  . COREG CR 20 MG 24 hr capsule TK 1 C PO QAM WF  5  . ferrous sulfate 325 (65 FE) MG tablet TK 1 T PO D UTD  11  . fluticasone (FLONASE) 50 MCG/ACT nasal spray USE 1 SPRAY IN EACH NOSTRIL D  1  . folic acid (FOLVITE)  1 MG tablet Take 1 mg by mouth daily.    . isosorbide-hydrALAZINE (BIDIL) 20-37.5 MG per tablet Take 1 tablet by mouth 3 (three) times daily. 90 tablet 1  . levETIRAcetam (KEPPRA) 500 MG tablet Take 1 tablet (500 mg total) by mouth 2 (two) times daily. Change to 250 mg in morning and 500 mg at night 60 tablet 0  . metoCLOPramide (REGLAN) 10 MG tablet Take 1 tablet (10 mg total) by mouth 3 (three) times daily as needed for nausea. 90 tablet 1  . ondansetron (ZOFRAN) 4 MG tablet Take 1 tablet (4 mg total) by mouth every 6 (six) hours as needed for nausea. 20 tablet 0  . pantoprazole (PROTONIX) 40 MG tablet Take 1 tablet (40 mg total) by mouth 2 (two) times daily. 30 min prior to breakfast and 30 min prior to supper 90 tablet 3  . traZODone (DESYREL) 100 MG tablet Take 100 mg by mouth at bedtime.    . vitamin B-12 1000 MCG tablet Take 1 tablet (1,000 mcg total) by mouth daily. 30 tablet 1   No current facility-administered medications for this visit.   Facility-Administered Medications Ordered in Other Visits  Medication Dose Route Frequency Provider Last Rate Last Dose  . sodium chloride 0.9 % injection 10-40 mL  10-40 mL Intracatheter PRN Ladene Artist, MD   10 mL at 07/09/14 0501   Allergies  Allergen Reactions  . Tramadol     Seizures       Review of Systems: Gen: Denies any fever, chills, sweats, anorexia, fatigue, weakness, malaise, weight loss, and sleep disorder CV: Denies chest pain, angina, palpitations, syncope, orthopnea, PND, peripheral edema, and claudication. Resp: Denies dyspnea at rest, dyspnea with exercise, cough, sputum, wheezing, coughing up blood, and pleurisy. GI: Denies vomiting blood, jaundice, and fecal incontinence.   Denies dysphagia or odynophagia. GU : Denies urinary burning, blood in urine, urinary frequency, urinary hesitancy, nocturnal urination, and urinary incontinence. MS: Denies joint pain, limitation of movement, and swelling, stiffness, low back  pain, extremity pain. Denies muscle weakness, cramps, atrophy.  Derm: Denies rash, itching, dry skin, hives, moles, warts, or unhealing ulcers.  Psych: Denies depression, anxiety, memory loss, suicidal ideation, hallucinations, paranoia, and confusion. Heme: Denies bruising, bleeding, and enlarged lymph nodes. Neuro:  Denies any headaches, dizziness, paresthesia Endo:  Denies any problems with DM, thyroid, adrenal    Physical Exam: General: Pleasant, well developed , female in no acute distress Head: Normocephalic and atraumatic Eyes:  sclerae anicteric, conjunctiva pink  Ears: Normal auditory acuity Lungs: Clear throughout to auscultation Heart: Regular rate and rhythm. 2/6 murmur Abdomen: Soft, non distended, non-tender. No masses, no hepatomegaly. Normal bowel sounds Musculoskeletal: Symmetrical with no gross deformities  Extremities: No edema  Neurological: Alert oriented x  4, grossly nonfocal Psychological:  Alert and cooperative. Normal mood and affect  Assessment and Recommendations: 74 year old female with a history of esophagitis, gastritis, duodenitis here for follow-up. She is currently doing well on a regimen of twice a day pantoprazole with when necessary Reglan and Zofran. She will continue this regimen. Her Crohn's appears to be well controlled. We will again try to obtain a copy of her colonoscopy report from [facility] gastroenterology. She will follow-up in November or December, at which time she would like to establish care with Dr. Silverio Decamp.        Tamela Elsayed, Vita Barley PA-C 11/06/2014,

## 2014-11-06 NOTE — Patient Instructions (Signed)
Continue Pantoprazole twice a day. Continue Reglan as needed. Continue Zofran as needed.

## 2014-11-13 NOTE — Patient Outreach (Signed)
[facility]) Care Management  11/13/2014  Demmi Sindt Aug 26, 1940 [id number]   No response from patient after 3 telephone calls and letter outreach.   PLAN: RNCM will refer patient to Lurline Del to close due to inability to establish contact with patient. Dr. Leonie Man will be notified.   Quinn Plowman RN,BSN,CCM [place] Coordinator [phone]

## 2014-11-18 ENCOUNTER — Telehealth: Payer: Self-pay | Admitting: Neurology

## 2014-11-18 NOTE — Telephone Encounter (Signed)
Pt's daughter would like to have a call back about her EEG results and about pt's last visit.Please call and advise [phone]

## 2014-11-18 NOTE — Telephone Encounter (Signed)
Talk to patients granddaughter about her grandmother needing a letter for other services. Patients  granddaughter Lahoma Crocker was given our fax number to fax over what needs to be in the letter to the [facility] services.

## 2014-11-18 NOTE — Telephone Encounter (Signed)
Rn left message for daughter to return phone call. Rn was not given the name of the daughter in the below message. The patients granddaughter is the only person on the [facility]a contact form to notify of any results or test. [name](Rn) call and spoke to patients granddaughter on 10-27-14 to notify of the EEG results.

## 2014-11-19 ENCOUNTER — Ambulatory Visit: Payer: Medicare Other | Admitting: Physician Assistant

## 2014-11-19 NOTE — Telephone Encounter (Signed)
Ok to do. Kindly do the letter

## 2014-11-20 NOTE — Telephone Encounter (Signed)
If patients granddaughter call back, give her the below message:  Patients granddaughter requested a letter for more services for her grandmother. Letter has been written. Dr.Sethi needs to review, and sign the letter.

## 2014-11-24 NOTE — Telephone Encounter (Signed)
Rn left message for patient concerning letter for her grandmother. Rn stated letter is done. Rn left number for call back

## 2014-11-26 ENCOUNTER — Other Ambulatory Visit: Payer: Self-pay | Admitting: Neurology

## 2014-11-26 NOTE — Telephone Encounter (Signed)
Patients granddaughter was given letter from Dr. Leonie Man for patient to receive more services.

## 2014-12-02 ENCOUNTER — Other Ambulatory Visit: Payer: Self-pay | Admitting: Physician Assistant

## 2014-12-23 ENCOUNTER — Encounter: Payer: Self-pay | Admitting: Neurology

## 2014-12-23 ENCOUNTER — Ambulatory Visit (INDEPENDENT_AMBULATORY_CARE_PROVIDER_SITE_OTHER): Payer: Medicare Other | Admitting: Neurology

## 2014-12-23 VITALS — BP 138/57 | HR 49 | Ht 66.0 in | Wt 146.4 lb

## 2014-12-23 DIAGNOSIS — R569 Unspecified convulsions: Secondary | ICD-10-CM | POA: Diagnosis not present

## 2014-12-23 MED ORDER — LEVETIRACETAM 500 MG PO TABS: 500.0000 mg | ORAL_TABLET | Freq: Two times a day (BID) | ORAL | Status: DC

## 2014-12-23 NOTE — Patient Instructions (Addendum)
I had a long d/w patient about her remote stroke, risk for recurrent stroke/TIAs, personally independently reviewed imaging studies and stroke evaluation results and answered questions. I also discussed the need to continue Plavix for secondary stroke prevention and maintain strict control of hypertension with blood pressure goal below 130/90 and lipids with LDL cholesterol goal below 100 mg/dL. I also advised the patient to eat a healthy diet with plenty of whole grains, cereals, fruits and vegetables, exercise regularly and maintain ideal body weight and to maintain good hydration and drink at least 6-8 glasses of liquids per day. She was also advised to continue Keppra 250 mg in the morning and 500 mg at night for seizure prophylaxis. She was given a refill for 6 months. Followup in the future with me in 6 months or call earlier if necessary

## 2014-12-23 NOTE — Progress Notes (Signed)
[facility] [address] [place]. [place] [address] [phone]       OFFICE FOLLOW UP VISIT NOTE  [name] Date of Birth:  1941-02-28 Medical Record Number:  [id number]   Referring MD: Yong Channel Reason for Referral:  Seizure and stroke HPI: 58 year frail elderly African-American lady who is had 2 recent admissions to [facility] in March and June 2016. Patient is unable to provide history which is obtained from her granddaughter as well as review of electronic medical records. In March 2016 she was found unresponsive on the floor at home by her granddaughter. She she was admitted to [facility] where she was found to have evidence of urinary tract infection as well as sepsis. MRI scan of the brain showed a small left thalamic infarct which was felt not to be able to explain her neurological exam. She had a prolonged recovery and went to inpatient rehabilitation as well as [place] home and was quite weak and tired but able to ambulate. She was home barely few weeks while she was readmitted in June with again altered mental status. The granddaughter feels this may have been brought on by increasing dose of tramadol that she was taking for pain. She had a witnessed episode of seizure at that time which as per granddaughter's eyewitness account showed tonic movement in the right arm followed by clonic activity as well as head jerking and eyeballs rolling up lasting few minutes. She was started on Keppra 500 twice a day following this. EEG however was abnormal even in the first admission in March and Dr. Nicole Kindred read an EEG on 06/20/2014 showing periodic left sharp waves. Patient has continued to feel weak and lose weight overall. The last couple of weeks appetite has improved. 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 There is a tiny  diffusion positive lesion in the left middle cerebral peduncle which is artifact versus tiny infarct. Patient's granddaughter feels that she is quite sleepy during the day and this may be related to [place]. She is walking with a cane and has had no major falls. She's had no recurrent seizure episodes either. She is on Plavix for second stroke prevention she is tolerating well without bruising or bleeding and remains on Lipitor. Update 12/23/2014 : She returns for follow-up after initial consultation 2 months ago. She is accompanied by daughter. Patient has noticed increase in daytime alertness and more perspiration in activities since reducing the dose of Keppra from 500-to 250 in the morning. She is tolerating Keppra 500 mg at night which helps her sleep. She had outpatient EEG done on 10/24/14 which I personally reviewed and showed mild left greater than right hemispheric slowing but without definite seizure activity. Patient complains of occasional dizzy episodes and staggering but this is infrequent. The daughter feels this may be related to low blood pressure  . She is living at home. She is admits she is not eating well. She has no new complaints today. She remains on Plavix which is tolerating well without bleeding or bruising ROS:   14 system review of systems is positive for  headache, dizziness and all other systems negative PMH:  Past Medical History  Diagnosis Date  . CHF (congestive heart failure)   . Hypertension   . Back pain, chronic   . Hip pain, chronic   . GERD (gastroesophageal reflux disease)   .  Crohn disease   . Diabetes mellitus without complication   . Esophageal stricture     s/p dilation 2003, 2008  . PUD (peptic ulcer disease)   . CKD (chronic kidney disease)   . COPD (chronic obstructive pulmonary disease)   . Arthritis   . AKI (acute kidney injury)   . Sleep apnea   . Seizures   . Stroke     Social History:  Social History   Social History  . Marital Status:  Widowed    Spouse Name: N/A  . Number of Children: N/A  . Years of Education: N/A   Occupational History  . Not on file.   Social History Main Topics  . Smoking status: Former Smoker -- 1.00 packs/day for 30 years    Types: Cigarettes    Quit date: 05/27/2014  . Smokeless tobacco: Never Used  . Alcohol Use: No  . Drug Use: No  . Sexual Activity: No   Other Topics Concern  . Not on file   Social History Narrative   24 oz of caffeine a day    Lives alone at home occasionally with granddaughter     Widowed and disabled     Medications:   Current Outpatient Prescriptions on File Prior to Visit  Medication Sig Dispense Refill  . acetaminophen (TYLENOL) 500 MG tablet Take 1,000 mg by mouth every 6 (six) hours as needed for mild pain or moderate pain.    Marland Kitchen atorvastatin (LIPITOR) 40 MG tablet Take 1 tablet (40 mg total) by mouth daily at 6 PM. 30 tablet 0  . Cholecalciferol (VITAMIN D) 2000 UNITS CAPS Take 1 capsule by mouth daily.    . clopidogrel (PLAVIX) 75 MG tablet Take 1 tablet (75 mg total) by mouth daily. 30 tablet 0  . COREG CR 20 MG 24 hr capsule TK 1 C PO QAM WF  5  . ferrous sulfate 325 (65 FE) MG tablet TK 1 T PO D UTD  11  . fluticasone (FLONASE) 50 MCG/ACT nasal spray USE 1 SPRAY IN EACH NOSTRIL D  1  . folic acid (FOLVITE) 1 MG tablet Take 1 mg by mouth daily.    . isosorbide-hydrALAZINE (BIDIL) 20-37.5 MG per tablet Take 1 tablet by mouth 3 (three) times daily. 90 tablet 1  . metoCLOPramide (REGLAN) 10 MG tablet Take 1 tablet (10 mg total) by mouth 3 (three) times daily as needed for nausea. 90 tablet 1  . metoCLOPramide (REGLAN) 10 MG tablet TAKE 1 TABLET BY MOUTH THREE TIMES DAILY AS NEEDED FOR NAUSEA 90 tablet 0  . ondansetron (ZOFRAN) 4 MG tablet Take 1 tablet (4 mg total) by mouth every 6 (six) hours as needed for nausea. 20 tablet 0  . pantoprazole (PROTONIX) 40 MG tablet Take 1 tablet (40 mg total) by mouth 2 (two) times daily. 30 min prior to breakfast and 30  min prior to supper 90 tablet 3  . vitamin B-12 1000 MCG tablet Take 1 tablet (1,000 mcg total) by mouth daily. 30 tablet 1   Current Facility-Administered Medications on File Prior to Visit  Medication Dose Route Frequency Provider Last Rate Last Dose  . sodium chloride 0.9 % injection 10-40 mL  10-40 mL Intracatheter PRN Ladene Artist, MD   10 mL at 07/09/14 0501    Allergies:   Allergies  Allergen Reactions  . Tramadol     Seizures     Physical Exam General: Frail elderly African-American lady, seated, in no evident distress Head:  head normocephalic and atraumatic.   Neck: supple with no carotid or supraclavicular bruits Cardiovascular: regular rate and rhythm, no murmurs Musculoskeletal: no deformity Skin:  no rash/petichiae Vascular:  Normal pulses all extremities  Neurologic Exam Mental Status: Awake and fully alert. Oriented to place and person only.. Recent and remote memory intact. Attention span, concentration and fund of knowledge diminished slightly.. Mood and affect appropriate. Diminished recall 2/3. Cranial Nerves: Fundoscopic exam reveals sharp disc margins. Pupils equal, briskly reactive to light. Extraocular movements full without nystagmus. Visual fields full to confrontation. Hearing intact. Facial sensation intact. Face, tongue, palate moves normally and symmetrically.  Motor: Normal bulk and tone. Normal strength in all tested extremity muscles. Diminished fine finger movements on the left. Orbits right over left approximately. Sensory.: intact to touch , pinprick , position and vibratory sensation.  Coordination: Rapid alternating movements normal in all extremities. Finger-to-nose and heel-to-shin performed accurately bilaterally. Gait and Station: Arises from chair without difficulty. Stance is normal. Gait demonstrates normal stride length and balance uses a cane for walking.. Able to heel, toe and tandem walk with slight difficulty.  Reflexes: 1+ and  symmetric. Toes downgoing.     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 Vascular risk factors of hyperlipidemia, hypertension, CHF    PLAN: I had a long d/w patient about her remote stroke, risk for recurrent stroke/TIAs, personally independently reviewed imaging studies and stroke evaluation results and answered questions. I also discussed the need to continue Plavix for secondary stroke prevention and maintain strict control of hypertension with blood pressure goal below 130/90 and lipids with LDL cholesterol goal below 100 mg/dL. I also advised the patient to eat a healthy diet with plenty of whole grains, cereals, fruits and vegetables, exercise regularly and maintain ideal body weight and to maintain good hydration and drink at least 6-8 glasses of liquids per day. She was also advised to continue Keppra 250 mg in the morning and 500 mg at night for seizure prophylaxis. She was given a refill for 6 months. Followup in the future with me in 6 months or call earlier if necessary   Antony Contras, MD  Note: This document was prepared with digital dictation and possible smart phrase technology. Any transcriptional errors that result from this process are unintentional.

## 2015-01-13 ENCOUNTER — Encounter: Payer: Self-pay | Admitting: Gastroenterology

## 2015-01-28 ENCOUNTER — Telehealth: Payer: Self-pay | Admitting: Neurology

## 2015-01-28 NOTE — Telephone Encounter (Signed)
RN call patients granddaughter Bartolo Darter about needing a updated letter stating her grandmother was seen in Sept for office visit, but wants the letter to remain the same. Rn explain that Dr.Sethi will not be back in office till Monday to do letter. Pts granddaughter stated that's fine, and she will pick up letter when its ready.

## 2015-01-28 NOTE — Telephone Encounter (Signed)
Granddaughter called to request letter like the one dated 11/20/14 updating last seen date (12/23/14).

## 2015-02-02 NOTE — Telephone Encounter (Signed)
Rn call patients granddaughter to state the letter is ready for her grandmother Michelle Farrell. Its a letter to get benefits and help for her. Letter sign and dated. Rn stated a revised letter will be at the front desk for pick up.

## 2015-04-20 ENCOUNTER — Telehealth: Payer: Self-pay | Admitting: Neurology

## 2015-04-20 NOTE — Telephone Encounter (Signed)
Pt granddaughter/[name] called pt was found on the floor unresponsive and went to [facility] she was not alert and memory loss they really do not know what happened to her. She is a patient of Dr Leonie Man and needs appt asap. His 1st available is 06/22/15. Please call [name] at [phone]. dg

## 2015-04-20 NOTE — Telephone Encounter (Signed)
Rn call patients granddaughter Michelle Farrell about her grandmother being in the hospital in December 2016. Her grandmother was admitted to [facility] for not alert and memory loss. She was admitted and later move to rehab. PT had multiple MRI and a 24 hour EEG study. Rn stated Dr. Leonie Man will be sent a message about if she needs to be seen sooner. PTs granddaughter verbalized understanding.

## 2015-04-23 NOTE — Telephone Encounter (Signed)
I spoke to her and she requested the follow-up appointment earlier than March. I said that the patient may be placed on a cancellation list to be seen sooner if possible. Otherwise keep appointment in March and continue present medications

## 2015-04-29 ENCOUNTER — Other Ambulatory Visit: Payer: Self-pay

## 2015-04-29 DIAGNOSIS — R569 Unspecified convulsions: Secondary | ICD-10-CM

## 2015-04-29 MED ORDER — LEVETIRACETAM 500 MG PO TABS: 500.0000 mg | ORAL_TABLET | Freq: Two times a day (BID) | ORAL | Status: DC

## 2015-04-29 MED ORDER — LACOSAMIDE 200 MG PO TABS: 200.0000 mg | ORAL_TABLET | Freq: Two times a day (BID) | ORAL | Status: DC

## 2015-04-29 NOTE — Telephone Encounter (Signed)
Pt's granddaughter called and states pt needs refill on medication Vimpat, it was prescribed while in the hospital. Granddaughter states it is the only thing working to keep pt from having seizures. Pt needs refill on levETIRAcetam (KEPPRA) 500 MG tablet.  Pt is completely out of Vimpat.

## 2015-04-29 NOTE — Telephone Encounter (Addendum)
Rn call patients granddaughter that it was a cancellation appt available for tomorrow for 1100. [name] stated she was at work and would be unable to take her. She will find a family member to bring her grandmother to the appointment. [name] requested a earlier appt with Dr.Sethi.See phone note. Rn stated a refill was sent for keppra, but not for the vimpat. Rn stated vimpat was not on the current medication list. Rn ask Bartolo Darter if she had the pill bottle, or discharge summary from [facility] to find out what dose her vimpat was. Rn explain Dr.Sethi is seeing patients today, and will evaluate her medications. [name] stated the pts PCP gave vimpat medication until pt could see Dr.Sethi. Pts granddaughter stated she did not want her grandmother to have another seizure, and she needed a refill. Rn ask [name] if she had knew the dosage and strength so Dr.Sethi could know and evaluate her chart.PTs granddaughter became upset that no one was listening to her or wanted to help her. She stated when she talk to Dr.Sethi on 04/23/2015 she told him about the medication changes. Rn explain that we are trying to help her and her grandmother. [name](pts granddaughter fell like she is being rush with the appt for tomorrow and she has taken time off for her grandmother in the past. Rn explain that she requested a earlier appt on 04/20/2015 for the patient and that's why a call was being made to her. Pts granddaughter just stated" ( she will be there tomorrow for the appt). Message will be sent to [place].

## 2015-04-29 NOTE — Telephone Encounter (Signed)
LFt vm for patients grandaughter [name] about her mom needing refill for keppra. Rn can do refill on keppra. PTs grandmother was hospitalized at [facility] in December 2016. Dr.Sethi cannot prescribed Vimpat because it was prescribed by a different md at [facility]. Message will be sent to Dr.Sethi about vimpat refills. Rn explain on vm we need the pill bottle or discharge summary stating what amount and dosage patient takes for vimpat.

## 2015-04-29 NOTE — Telephone Encounter (Signed)
LFt vm for pts granddaughter that pts vimpat is showing under [facility].. Rn look in care everywhere and  The medication showed up. Dr.Sethi give patient prescription until she sees him. Pt is also on keppra to for seizures. Rn call [facility] in [place] and confirm pt receives her refills there.

## 2015-04-30 ENCOUNTER — Ambulatory Visit (INDEPENDENT_AMBULATORY_CARE_PROVIDER_SITE_OTHER): Payer: Medicare Other | Admitting: Neurology

## 2015-04-30 ENCOUNTER — Encounter: Payer: Self-pay | Admitting: Neurology

## 2015-04-30 VITALS — Ht 66.0 in | Wt 141.8 lb

## 2015-04-30 DIAGNOSIS — R569 Unspecified convulsions: Secondary | ICD-10-CM | POA: Diagnosis not present

## 2015-04-30 NOTE — Telephone Encounter (Signed)
Ok to refill x 1 till she is seen for f/u in our clinic

## 2015-04-30 NOTE — Telephone Encounter (Signed)
Vimpat prescription fax  [facility] and receive.T/C made and Kristen at  [facility] stated it did not require a pre authorization. Pt had a previous prescription in the last 4 weeks.

## 2015-04-30 NOTE — Progress Notes (Signed)
[facility] [address] [place]. [place] [address] [phone]       OFFICE FOLLOW UP VISIT NOTE  [name] Date of Birth:  12-Jan-1941 Medical Record Number:  [id number]   Referring MD: Yong Channel Reason for Referral:  Seizure and stroke HPI: 64 year frail elderly African-American lady who is had 2 recent admissions to [facility] in March and June 2016. Patient is unable to provide history which is obtained from her granddaughter as well as review of electronic medical records. In March 2016 she was found unresponsive on the floor at home by her granddaughter. She she was admitted to [facility] where she was found to have evidence of urinary tract infection as well as sepsis. MRI scan of the brain showed a small left thalamic infarct which was felt not to be able to explain her neurological exam. She had a prolonged recovery and went to inpatient rehabilitation as well as [place] home and was quite weak and tired but able to ambulate. She was home barely few weeks while she was readmitted in June with again altered mental status. The granddaughter feels this may have been brought on by increasing dose of tramadol that she was taking for pain. She had a witnessed episode of seizure at that time which as per granddaughter's eyewitness account showed tonic movement in the right arm followed by clonic activity as well as head jerking and eyeballs rolling up lasting few minutes. She was started on Keppra 500 twice a day following this. EEG however was abnormal even in the first admission in March and Dr. Nicole Kindred read an EEG on 06/20/2014 showing periodic left sharp waves. Patient has continued to feel weak and lose weight overall. The last couple of weeks appetite has improved. 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 There is a tiny  diffusion positive lesion in the left middle cerebral peduncle which is artifact versus tiny infarct. Patient's granddaughter feels that she is quite sleepy during the day and this may be related to [place]. She is walking with a cane and has had no major falls. She's had no recurrent seizure episodes either. She is on Plavix for second stroke prevention she is tolerating well without bruising or bleeding and remains on Lipitor. Update 12/23/2014 : She returns for follow-up after initial consultation 2 months ago. She is accompanied by daughter. Patient has noticed increase in daytime alertness and more perspiration in activities since reducing the dose of Keppra from 500-to 250 in the morning. She is tolerating Keppra 500 mg at night which helps her sleep. She had outpatient EEG done on 10/24/14 which I personally reviewed and showed mild left greater than right hemispheric slowing but without definite seizure activity. Patient complains of occasional dizzy episodes and staggering but this is infrequent. The daughter feels this may be related to low blood pressure  . She is living at home. She is admits she is not eating well. She has no new complaints today. She remains on Plavix which is tolerating well without bleeding or bruising Update 04/30/2015 : She returns for f/u after last visit 4 months ago. She is accompanied by a friend.   Patient was hospitalized  At [facility] from 02/24/15 -03/05/15 for Encephalopathy. felt to be multifactorial including metabolic and toxic elements. I spoke to the patient's granddaughter over the phone who informed me that patient  was initially treated for herpes encephalitis with acyclovir   and infectious disease were consulted. 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 A secretory was eventually stopped. She was fine the night prior to next day she was found unresponsive at home lying in feces with possible seizures. She remained confused  for several days and initial diagnosis of stroke subsequently this was changed to herpes encephalitis versus limbic encephalitis but definitive diagnosis was not made. I do not have actual medical records and information I found was through care everywhere through outpatient physician's office notes and speaking to grand daughter over the phone.. Patient was treated initially with Acyclovir and rocephin in hospital but these were stopped after lumbar puncture was negative for signs of infection. Patient was seen by neurology and infectious disease while in hospital. Imaging showed previous stroke but nothing new. MRI Brain done on 02/25/15 and 03/04/15 which I personally reviewed showed left hippocampal medial temporal lobe diffusion abnormalities likely seizure effect. Vimpat was added to Keppra for seizures. Patient's states she's been home now for a month. She states her memory still not good. She also has some trouble with walking and balance and she stumbles. She requires some help with the cooking but is otherwise independent with activities of daily living. She has been advised to use a cane but she does not do so. She is had no falls or injuries. She is currently taking Vimpat 200 mg twice daily and Keppra 500 mg twice daily. She remains on Plavix for stroke prevention and Lipitor and has had no stroke or TIA symptoms. On Mini-Mental status exam testing today she scored 27/30. She was only able to name 43 legged animals. Clock drawing she scored 3/4. She has not had any further seizures since discharge  ROS:   14 system review of systems is positive for  headache, dizziness and all other systems negative PMH:  Past Medical History  Diagnosis Date  . CHF (congestive heart failure) ([place])   . Hypertension   . Back pain, chronic   . Hip pain, chronic   . GERD (gastroesophageal reflux disease)   . Crohn disease ([place])   . Diabetes mellitus without complication ([place])   . Esophageal stricture     s/p  dilation 2003, 2008  . PUD (peptic ulcer disease)   . CKD (chronic kidney disease)   . COPD (chronic obstructive pulmonary disease) ([place])   . Arthritis   . AKI (acute kidney injury) ([place])   . Sleep apnea   . Seizures ([place])   . Stroke [facility])     Social History:  Social History   Social History  . Marital Status: Widowed    Spouse Name: N/A  . Number of Children: N/A  . Years of Education: N/A   Occupational History  . Not on file.   Social History Main Topics  . Smoking status: Former Smoker -- 1.00 packs/day for 30 years    Types: Cigarettes    Quit date: 05/27/2014  . Smokeless tobacco: Never Used  . Alcohol Use: No  . Drug Use: No  . Sexual Activity: No   Other Topics Concern  . Not on file   Social History Narrative   24 oz of caffeine a day    Lives alone at home occasionally with granddaughter     Widowed and disabled     Medications:   Current Outpatient Prescriptions on File Prior to Visit  Medication Sig Dispense Refill  .  acetaminophen (TYLENOL) 500 MG tablet Take 1,000 mg by mouth every 6 (six) hours as needed for mild pain or moderate pain.    Marland Kitchen atorvastatin (LIPITOR) 40 MG tablet Take 1 tablet (40 mg total) by mouth daily at 6 PM. 30 tablet 0  . Cholecalciferol (VITAMIN D) 2000 UNITS CAPS Take 1 capsule by mouth daily.    . clopidogrel (PLAVIX) 75 MG tablet Take 1 tablet (75 mg total) by mouth daily. 30 tablet 0  . COREG CR 20 MG 24 hr capsule TK 1 C PO QAM WF  5  . ferrous sulfate 325 (65 FE) MG tablet TK 1 T PO D UTD  11  . fluticasone (FLONASE) 50 MCG/ACT nasal spray USE 1 SPRAY IN EACH NOSTRIL D  1  . folic acid (FOLVITE) 1 MG tablet Take 1 mg by mouth daily.    . isosorbide-hydrALAZINE (BIDIL) 20-37.5 MG per tablet Take 1 tablet by mouth 3 (three) times daily. 90 tablet 1  . lacosamide (VIMPAT) 200 MG TABS tablet Take 1 tablet (200 mg total) by mouth 2 (two) times daily. 60 tablet 3  . levETIRAcetam (KEPPRA) 500 MG tablet Take 1 tablet (500 mg  total) by mouth 2 (two) times daily. 45 tablet 6  . metoCLOPramide (REGLAN) 10 MG tablet Take 1 tablet (10 mg total) by mouth 3 (three) times daily as needed for nausea. 90 tablet 1  . ondansetron (ZOFRAN) 4 MG tablet Take 1 tablet (4 mg total) by mouth every 6 (six) hours as needed for nausea. 20 tablet 0  . pantoprazole (PROTONIX) 40 MG tablet Take 1 tablet (40 mg total) by mouth 2 (two) times daily. 30 min prior to breakfast and 30 min prior to supper 90 tablet 3  . vitamin B-12 1000 MCG tablet Take 1 tablet (1,000 mcg total) by mouth daily. 30 tablet 1   Current Facility-Administered Medications on File Prior to Visit  Medication Dose Route Frequency Provider Last Rate Last Dose  . sodium chloride 0.9 % injection 10-40 mL  10-40 mL Intracatheter PRN Ladene Artist, MD   10 mL at 07/09/14 0501    Allergies:   Allergies  Allergen Reactions  . Tramadol     Seizures     Physical Exam General: Frail elderly African-American lady, seated, in no evident distress Head: head normocephalic and atraumatic.   Neck: supple with no carotid or supraclavicular bruits Cardiovascular: regular rate and rhythm, no murmurs Musculoskeletal: no deformity Skin:  no rash/petichiae Vascular:  Normal pulses all extremities  Neurologic Exam Mental Status: Awake and fully alert. Oriented to place and person only.. Recent and remote memory intact. Attention span, concentration and fund of knowledge diminished slightly.. Mood and affect appropriate. MMSE 27/30 with deficits in recall and following 3 step commands. Able to name only for 4 legged animals. Trouble copying intersecting pentagons. Clock drawing 3/4. Cranial Nerves: Fundoscopic exam reveals sharp disc margins. Pupils equal, briskly reactive to light. Extraocular movements full without nystagmus. Visual fields full to confrontation. Hearing intact. Facial sensation intact. Face, tongue, palate moves normally and symmetrically.  Motor: Normal bulk and  tone. Normal strength in all tested extremity muscles. Diminished fine finger movements on the left. Orbits right over left approximately. Sensory.: intact to touch , pinprick , position and vibratory sensation.  Coordination: Rapid alternating movements normal in all extremities. Finger-to-nose and heel-to-shin performed accurately bilaterally. Gait and Station: Arises from chair without difficulty. Stance is slightly stooped Gait demonstrates normal stride length and balance uses a  cane for walking.. Able to heel, toe and tandem walk with slight difficulty.  Reflexes: 1+ and symmetric. Toes downgoing.     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 Recent admission in December 2016 lab in hospital with encephalopathy likely related to related to unwitnessed prolonged seizures due to MRI appearance of medial temporal abnormality which improved on follow-up imaging Vascular risk factors of hyperlipidemia, hypertension, CHF    PLAN:  I had a long discussion with the patient and her girlfriend but unfortunately do not have access to medical records from her hospitalization at [facility] in November [facility] last year. Clearly she had some breakthrough seizures and Vimpat was added to Keppra. MRI brain from 02/25/15 shows diffusion abnormalities in the medial hippocampus on the left which may be consistent with prolonged seizures I would recommend she continue on both Vimpat and Keppra for now until I review those records. Repeat EEG study. Continue Plavix for stroke prevention with strict control of hypertension with blood pressure goal below 130/90, lipids with LDL cholesterol goal below 70 mg percent. I spoke to the patient's granddaughter about her condition Return for follow-up in 3 months with  Gilford Raid, nurse practitioner or call earlier if necessary    Antony Contras, MD  Note: This document was prepared with digital dictation and possible smart phrase technology. Any transcriptional errors that result from this process are unintentional.

## 2015-04-30 NOTE — Patient Instructions (Signed)
I had a long discussion with the patient and her girlfriend but unfortunately do not have access to medical records from her hospitalization at [facility] in November /December last year. Clearly she had some breakthrough seizures and Vimpat was added to Keppra. MRI brain from 02/25/15 shows diffusion abnormalities in the medial hippocampus on the left which may be consistent with prolonged seizures I would recommend she continue on both Vimpat and Keppra for now until I review those records. Repeat EEG study. Continue Plavix for stroke prevention with strict control of hypertension with blood pressure goal below 130/90, lipids with LDL cholesterol goal below 70 mg percent. Return for follow-up in 3 months with Gilford Raid, nurse practitioner or call earlier if necessary

## 2015-05-26 ENCOUNTER — Ambulatory Visit (INDEPENDENT_AMBULATORY_CARE_PROVIDER_SITE_OTHER): Payer: Medicare Other | Admitting: Neurology

## 2015-05-26 DIAGNOSIS — R569 Unspecified convulsions: Secondary | ICD-10-CM

## 2015-06-26 ENCOUNTER — Ambulatory Visit: Payer: Medicare Other | Admitting: Neurology

## 2015-07-30 ENCOUNTER — Ambulatory Visit: Payer: Medicare Other | Admitting: Nurse Practitioner

## 2015-08-03 ENCOUNTER — Encounter: Payer: Self-pay | Admitting: Nurse Practitioner

## 2015-09-06 ENCOUNTER — Other Ambulatory Visit: Payer: Self-pay | Admitting: Neurology

## 2015-09-08 ENCOUNTER — Other Ambulatory Visit: Payer: Self-pay

## 2015-09-08 DIAGNOSIS — R569 Unspecified convulsions: Secondary | ICD-10-CM

## 2015-09-08 NOTE — Telephone Encounter (Signed)
PATIENT WAS A NO SHOW AT [facility] WITH CAROLYN NP. PT OR FAMILY MEMBER NEEDS TO CONTACT [facility] TO R/S APPT FOR REFILLS.

## 2015-09-09 ENCOUNTER — Other Ambulatory Visit: Payer: Self-pay

## 2015-09-09 DIAGNOSIS — R569 Unspecified convulsions: Secondary | ICD-10-CM

## 2015-09-09 MED ORDER — LACOSAMIDE 200 MG PO TABS: 200.0000 mg | ORAL_TABLET | Freq: Two times a day (BID) | ORAL | Status: DC

## 2016-01-04 ENCOUNTER — Other Ambulatory Visit: Payer: Self-pay | Admitting: Neurology

## 2016-01-05 ENCOUNTER — Telehealth: Payer: Self-pay

## 2016-01-05 NOTE — Telephone Encounter (Signed)
Rn call patients granddaughter Bartolo Darter about her appts and refills. Rn stated both numbers were call for appts reminders and both vm were full. [name] stated she does not live with her grandmother but takes care of her medications and appts. She also has to arrange transportation to her apps. Rn stated pt has to attend appts for all refills at [facility]. Rn schedule appt with NP on 01/12/2016 at 1130. Rn reminded that pt needs to arrive between 1100 and 1115pm. [name] phone number will be the only number for contact and appts.

## 2016-01-12 ENCOUNTER — Ambulatory Visit: Payer: Self-pay | Admitting: Nurse Practitioner

## 2016-01-13 ENCOUNTER — Encounter: Payer: Self-pay | Admitting: Nurse Practitioner

## 2016-01-13 ENCOUNTER — Ambulatory Visit (INDEPENDENT_AMBULATORY_CARE_PROVIDER_SITE_OTHER): Payer: Medicare Other | Admitting: Nurse Practitioner

## 2016-01-13 VITALS — BP 140/52 | HR 48 | Ht 66.0 in | Wt 145.6 lb

## 2016-01-13 DIAGNOSIS — G40109 Localization-related (focal) (partial) symptomatic epilepsy and epileptic syndromes with simple partial seizures, not intractable, without status epilepticus: Secondary | ICD-10-CM | POA: Diagnosis not present

## 2016-01-13 DIAGNOSIS — G40909 Epilepsy, unspecified, not intractable, without status epilepticus: Secondary | ICD-10-CM

## 2016-01-13 DIAGNOSIS — R569 Unspecified convulsions: Secondary | ICD-10-CM | POA: Diagnosis not present

## 2016-01-13 DIAGNOSIS — I6310 Cerebral infarction due to embolism of unspecified precerebral artery: Secondary | ICD-10-CM | POA: Diagnosis not present

## 2016-01-13 DIAGNOSIS — I10 Essential (primary) hypertension: Secondary | ICD-10-CM | POA: Diagnosis not present

## 2016-01-13 DIAGNOSIS — I69398 Other sequelae of cerebral infarction: Secondary | ICD-10-CM

## 2016-01-13 MED ORDER — LEVETIRACETAM 500 MG PO TABS: 500.0000 mg | ORAL_TABLET | Freq: Two times a day (BID) | ORAL | 3 refills | Status: DC

## 2016-01-13 MED ORDER — LACOSAMIDE 200 MG PO TABS: 200.0000 mg | ORAL_TABLET | Freq: Two times a day (BID) | ORAL | 1 refills | Status: DC

## 2016-01-13 NOTE — Patient Instructions (Addendum)
Continue Plavix for secondary stroke prevention Blood pressure goal 130/90 today's reading 140/52. Continue antihypertensive medication Continue Vimpat 200 mg twice daily will refill Continue Keppra 500 twice daily will refill Follow-up in 6 months

## 2016-01-13 NOTE — Progress Notes (Addendum)
[facility]  PATIENT: Michelle Farrell DOB: 1941/01/31   REASON FOR VISIT: Follow-up for seizure disorder history of stroke HISTORY FROM: Patient and caregiver    HISTORY OF PRESENT ILLNESS: HPI: PS74 year frail elderly African-American lady who is had 2 recent admissions to [facility] in March and June 2016. Patient is unable to provide history which is obtained from her granddaughter as well as review of electronic medical records. In March 2016 she was found unresponsive on the floor at home by her granddaughter. She she was admitted to [facility] where she was found to have evidence of urinary tract infection as well as sepsis. MRI scan of the brain showed a small left thalamic infarct which was felt not to be able to explain her neurological exam. She had a prolonged recovery and went to inpatient rehabilitation as well as [place] home and was quite weak and tired but able to ambulate. She was home barely few weeks while she was readmitted in June with again altered mental status. The granddaughter feels this may have been brought on by increasing dose of tramadol that she was taking for pain. She had a witnessed episode of seizure at that time which as per granddaughter's eyewitness account showed tonic movement in the right arm followed by clonic activity as well as head jerking and eyeballs rolling up lasting few minutes. She was started on Keppra 500 twice a day following this. EEG however was abnormal even in the first admission in March and Dr. Nicole Kindred read an EEG on 06/20/2014 showing periodic left sharp waves. Patient has continued to feel weak and lose weight overall. The last couple of weeks appetite has improved. 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 There is a tiny diffusion positive lesion in the left middle cerebral peduncle  which is artifact versus tiny infarct. Patient's granddaughter feels that she is quite sleepy during the day and this may be related to [place]. She is walking with a cane and has had no major falls. She's had no recurrent seizure episodes either. She is on Plavix for second stroke prevention she is tolerating well without bruising or bleeding and remains on Lipitor. Update 12/23/2014 PS: She returns for follow-up after initial consultation 2 months ago. She is accompanied by daughter. Patient has noticed increase in daytime alertness and more perspiration in activities since reducing the dose of Keppra from 500-to 250 in the morning. She is tolerating Keppra 500 mg at night which helps her sleep. She had outpatient EEG done on 10/24/14 which I personally reviewed and showed mild left greater than right hemispheric slowing but without definite seizure activity. Patient complains of occasional dizzy episodes and staggering but this is infrequent. The daughter feels this may be related to low blood pressure  . She is living at home. She is admits she is not eating well. She has no new complaints today. She remains on Plavix which is tolerating well without bleeding.   UPDATE 01/13/2016 Michelle Farrell, 75 year old female returns for follow-up with her caregiver. She has a history of seizure disorder with one seizure earlier in the week due to lack of sleep. She is currently on Keppra 500 twice daily and Vimpat 200 twice daily.EEG done on 10/24/14 which I personally reviewed and showed mild left greater than right hemispheric slowing but without definite seizure activity. She continues to live in her own  home with a paid caregiver. Granddaughter checks in frequently. She remains on Plavix for secondary stroke prevention. She has not had any bruising or bleeding. She returns for reevaluation REVIEW OF SYSTEMS: Full 14 system review of systems performed and notable only for those listed, all others are neg:  Constitutional:  neg  Cardiovascular: neg Ear/Nose/Throat: neg  Skin: neg Eyes: neg Respiratory: neg Gastroitestinal: neg  Hematology/Lymphatic: neg  Endocrine: neg Musculoskeletal:neg Allergy/Immunology: neg Neurological: Seizure disorder, history of stroke Psychiatric: neg Sleep : neg   ALLERGIES: Allergies  Allergen Reactions  . Tramadol     Seizures     HOME MEDICATIONS: Outpatient Medications Prior to Visit  Medication Sig Dispense Refill  . clopidogrel (PLAVIX) 75 MG tablet Take 1 tablet (75 mg total) by mouth daily. 30 tablet 0  . ferrous sulfate 325 (65 FE) MG tablet TK 1 T PO D UTD  11  . isosorbide mononitrate (IMDUR) 30 MG 24 hr tablet TK 1 T PO QD  0  . isosorbide-hydrALAZINE (BIDIL) 20-37.5 MG per tablet Take 1 tablet by mouth 3 (three) times daily. 90 tablet 1  . lacosamide (VIMPAT) 200 MG TABS tablet Take 1 tablet (200 mg total) by mouth 2 (two) times daily. 60 tablet 0  . levETIRAcetam (KEPPRA) 500 MG tablet TAKE 1 TABLET BY MOUTH TWICE DAILY 180 tablet 0  . acetaminophen (TYLENOL) 500 MG tablet Take 1,000 mg by mouth every 6 (six) hours as needed for mild pain or moderate pain.    Marland Kitchen atorvastatin (LIPITOR) 40 MG tablet Take 1 tablet (40 mg total) by mouth daily at 6 PM. (Patient not taking: Reported on 01/13/2016) 30 tablet 0  . Cholecalciferol (VITAMIN D) 2000 UNITS CAPS Take 1 capsule by mouth daily.    Marland Kitchen COREG CR 20 MG 24 hr capsule TK 1 C PO QAM WF  5  . DOK 100 MG capsule TK ONE C PO BID  0  . fluticasone (FLONASE) 50 MCG/ACT nasal spray USE 1 SPRAY IN EACH NOSTRIL D  1  . folic acid (FOLVITE) 1 MG tablet Take 1 mg by mouth daily.    . hydrALAZINE (APRESOLINE) 25 MG tablet Take 25 mg by mouth.    . metoCLOPramide (REGLAN) 10 MG tablet Take 1 tablet (10 mg total) by mouth 3 (three) times daily as needed for nausea. (Patient not taking: Reported on 01/13/2016) 90 tablet 1  . ondansetron (ZOFRAN) 4 MG tablet Take 1 tablet (4 mg total) by mouth every 6 (six) hours as needed  for nausea. (Patient not taking: Reported on 01/13/2016) 20 tablet 0  . pantoprazole (PROTONIX) 40 MG tablet Take 1 tablet (40 mg total) by mouth 2 (two) times daily. 30 min prior to breakfast and 30 min prior to supper (Patient not taking: Reported on 01/13/2016) 90 tablet 3  . SENEXON-S 8.6-50 MG tablet TK 1 T PO  HS.  0  . vitamin B-12 1000 MCG tablet Take 1 tablet (1,000 mcg total) by mouth daily. (Patient not taking: Reported on 01/13/2016) 30 tablet 1   Facility-Administered Medications Prior to Visit  Medication Dose Route Frequency Provider Last Rate Last Dose  . sodium chloride 0.9 % injection 10-40 mL  10-40 mL Intracatheter PRN Ladene Artist, MD   10 mL at 07/09/14 0501    PAST MEDICAL HISTORY: Past Medical History:  Diagnosis Date  . AKI (acute kidney injury) ([place])   . Arthritis   . Back pain, chronic   . CHF (congestive heart failure) ([place])   .  CKD (chronic kidney disease)   . COPD (chronic obstructive pulmonary disease) ([place])   . Crohn disease ([place])   . Diabetes mellitus without complication ([place])   . Esophageal stricture    s/p dilation 2003, 2008  . GERD (gastroesophageal reflux disease)   . Hip pain, chronic   . Hypertension   . PUD (peptic ulcer disease)   . Seizures ([place])   . Sleep apnea   . Stroke [facility])     PAST SURGICAL HISTORY: Past Surgical History:  Procedure Laterality Date  . ABDOMINAL HYSTERECTOMY    . BLADDER SURGERY    . CHOLECYSTECTOMY    . ESOPHAGOGASTRODUODENOSCOPY N/A 07/08/2014   Procedure: ESOPHAGOGASTRODUODENOSCOPY (EGD);  Surgeon: Ladene Artist, MD;  Location[facility] ENDOSCOPY;  Service: Endoscopy;  Laterality: N/A;  . TONSILLECTOMY      FAMILY HISTORY: Family History  Problem Relation Age of Onset  . Ovarian cancer Maternal Grandmother   . Stroke Mother   . Stroke Brother     SOCIAL HISTORY: Social History   Social History  . Marital status: Widowed    Spouse name: N/A  . Number of children: N/A  . Years of education: N/A     Occupational History  . Not on file.   Social History Main Topics  . Smoking status: Former Smoker    Packs/day: 1.00    Years: 30.00    Types: Cigarettes    Quit date: 05/27/2014  . Smokeless tobacco: Never Used  . Alcohol use No  . Drug use: No  . Sexual activity: No   Other Topics Concern  . Not on file   Social History Narrative   24 oz of caffeine a day    Lives alone at home occasionally with granddaughter     Widowed and disabled      PHYSICAL EXAM  Vitals:   01/13/16 1500  BP: (!) 140/52  Pulse: (!) 48  Weight: 145 lb 9.6 oz (66 kg)  Height: 5' 6"  (1.676 m)   Body mass index is 23.5 kg/m. General: Frail elderly African-American lady, seated, in no evident distress Head: head normocephalic and atraumatic.   Neck: supple with no carotid  bruits Cardiovascular: regular rate and rhythm, no murmurs Musculoskeletal: no deformity Skin:  no rash/petichiae Vascular:  Normal pulses all extremities Neurological examination  Mental Status: Awake and fully alert. Oriented to place and person only.. Recent and remote memory intact. Attention span, concentration and fund of knowledge diminished slightly.. Mood and affect appropriate.  Cranial Nerves: Fundoscopic exam reveals sharp disc margins. Pupils equal, briskly reactive to light. Extraocular movements full without nystagmus. Visual fields full to confrontation. Hearing intact. Facial sensation intact. Face, tongue, palate moves normally and symmetrically.  Motor: Normal bulk and tone. Normal strength in all tested extremity muscles. Diminished fine finger movements on the left. Orbits right over left . Sensory.: intact to touch , pinprick , position and vibratory sensation in the upper and lower extremities.  Coordination: Rapid alternating movements normal in all extremities. Finger-to-nose and heel-to-shin performed accurately bilaterally. Gait and Station: Arises from chair without difficulty. Stance is normal. Gait  demonstrates normal stride length and balance uses a cane for walking.. Able to heel, toe and tandem walk with slight difficulty.  Reflexes: 1+ and symmetric. Toes downgoing.    DIAGNOSTIC DATA (LABS, IMAGING, TESTING) - I reviewed patient records, labs, notes, testing and imaging myself where available.  Lab Results  Component Value Date   WBC 10.1 09/13/2014   HGB 10.4 (  L) 09/13/2014   HCT 29.9 (L) 09/13/2014   MCV 82.1 09/13/2014   PLT 304 09/13/2014      Component Value Date/Time   NA 136 09/15/2014 0635   K 3.4 (L) 09/15/2014 0635   CL 104 09/15/2014 0635   CO2 23 09/15/2014 0635   GLUCOSE 141 (H) 09/15/2014 0635   BUN <5 (L) 09/15/2014 0635   CREATININE 1.00 09/15/2014 0635   CALCIUM 10.0 09/15/2014 0635   PROT 6.3 (L) 09/12/2014 0853   ALBUMIN 3.3 (L) 09/12/2014 0853   AST 15 09/12/2014 0853   ALT 9 (L) 09/12/2014 0853   ALKPHOS 53 09/12/2014 0853   BILITOT 0.4 09/12/2014 0853   GFRNONAA 55 (L) 09/15/2014 0635   GFRAA >60 09/15/2014 0635   Lab Results  Component Value Date   CHOL 213 (H) 09/14/2014   HDL 35 (L) 09/14/2014   LDLCALC 138 (H) 09/14/2014   TRIG 200 (H) 09/14/2014   CHOLHDL 6.1 09/14/2014   Lab Results  Component Value Date   HGBA1C 5.3 06/20/2014   Lab Results  Component Value Date   VITAMINB12 229 07/06/2014   Lab Results  Component Value Date   TSH 0.491 07/05/2014      ASSESSMENT AND PLAN 40 year African-American lady with episodes of altered mental status with partial onset seizures in March and June 2016 in the setting of urinary tract infection with 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 Vascular risk factors of hyperlipidemia, hypertension, CHF. The patient is a current patient of Dr. Leonie Man  who is out of the office today . This note is sent to the work in doctor.      PLAN:Continue Plavix for secondary stroke prevention Blood pressure goal 130/90 today's reading 140/52. Continue antihypertensive  medication Continue Vimpat 200 mg twice daily will refill for seizure prophylaxis. Continue Keppra 500 twice daily will refill for seizure prophylaxis Lipids with LDL cholesterol goal below 100 mg/dL. Eat a healthy diet with plenty of whole grains, cereals, fruits and vegetables, exercise regularly and maintain ideal body weight and to maintain good hydration and drink at least 6-8 glasses of liquids per day.  Dennie Bible, [facility], [facility], APRN  [facility] [address] [place], [place] [address] [phone]  I reviewed the above note and documentation by the Nurse Practitioner and agree with the history, physical exam, assessment and plan as outlined above. I was immediately available for face-to-face consultation. Star Age, MD, PhD [facility])

## 2016-01-13 NOTE — Progress Notes (Signed)
Fax confirmation received.  [facility], [place]. Vimpat.

## 2016-05-18 IMAGING — CT CT CERVICAL SPINE W/O CM
3 of 4 series · 12 of 35 positions shown, 14 images · non-contrast
Comparison: None.

CLINICAL DATA: Found on floor, with right-sided gaze. Concern for
cervical spine injury. Initial encounter.

EXAM:
CT CERVICAL SPINE WITHOUT CONTRAST
TECHNIQUE: Multidetector CT imaging of the cervical spine was performed without
intravenous contrast. Multiplanar CT image reconstructions were also
generated.

[Series 6: coronals · coronal · 0.27mm/px · 3 of 55 slices shown]
[im 11/55  bone]
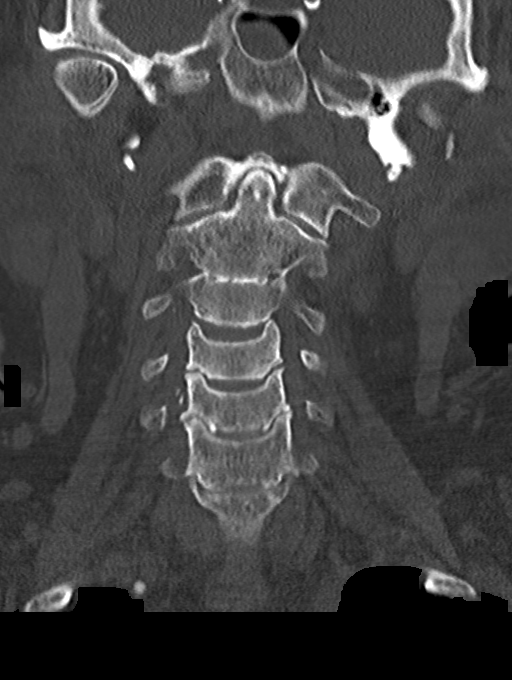
[im 22/55  bone]
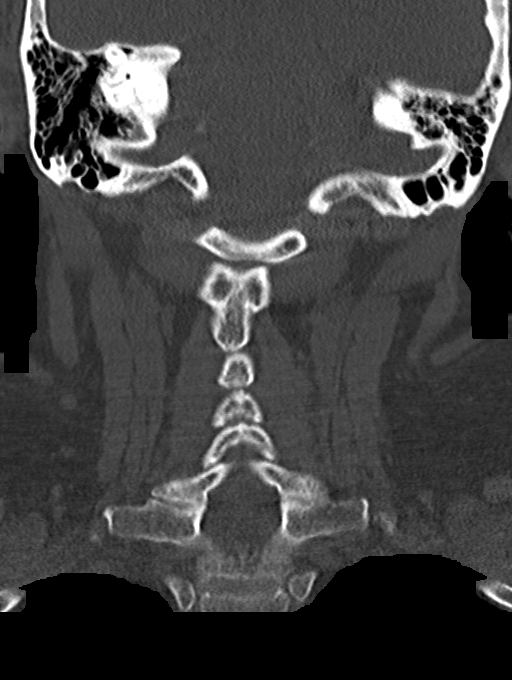
[im 33/55  bone]
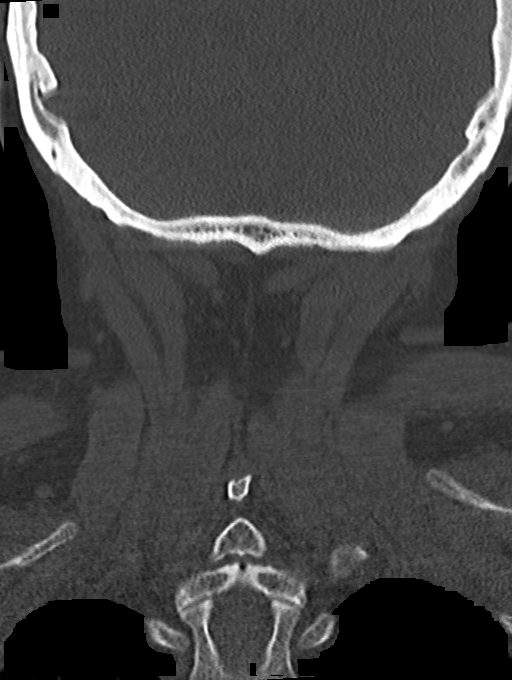

[Series 7: sagittals · sagittal · 0.23mm/px · 5 of 61 slices shown, 6 images]
[im 21/61  bone]
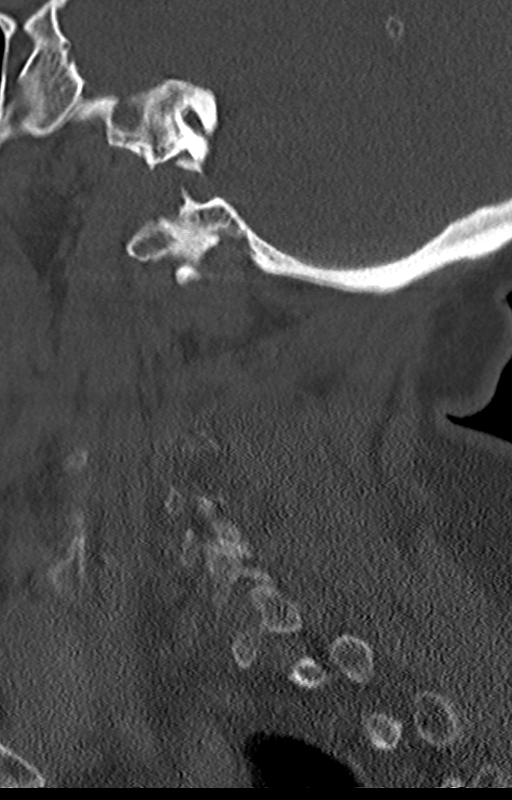
[im 26/61  bone]
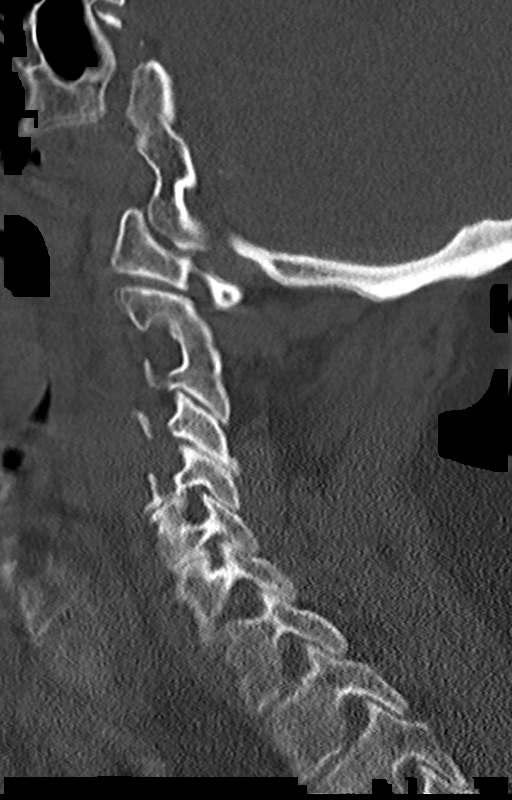
[im 31/61  soft-tissue]
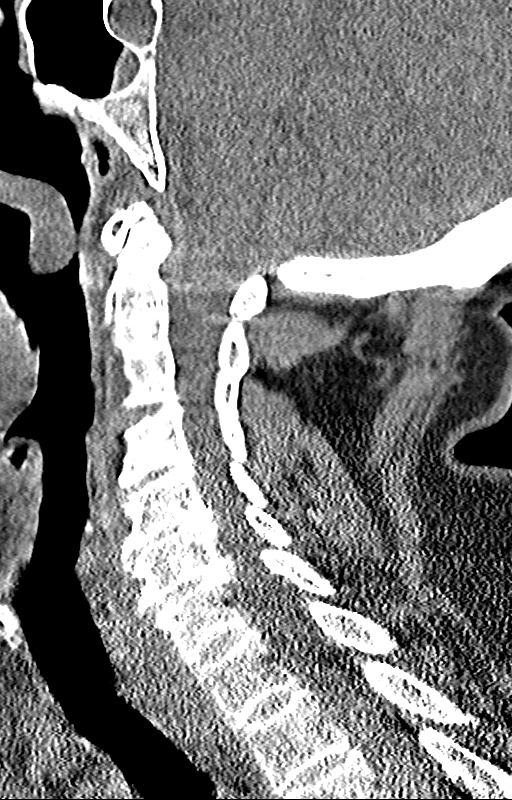
[im 31/61  bone]
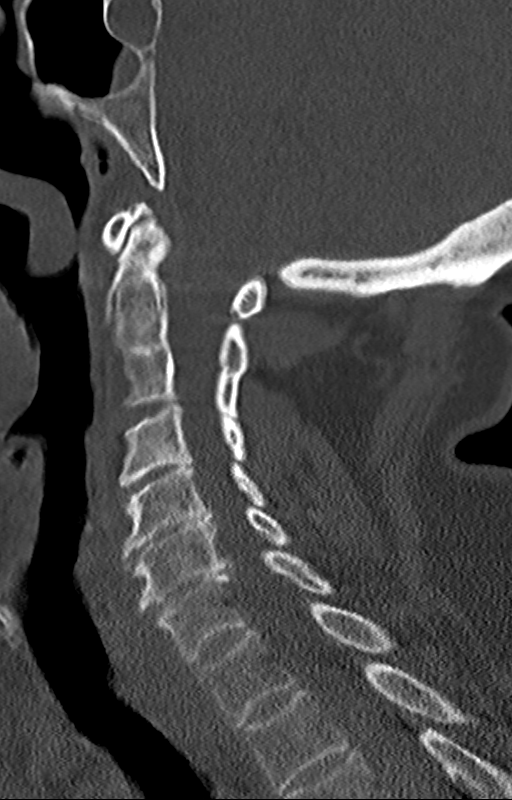
[im 36/61  bone]
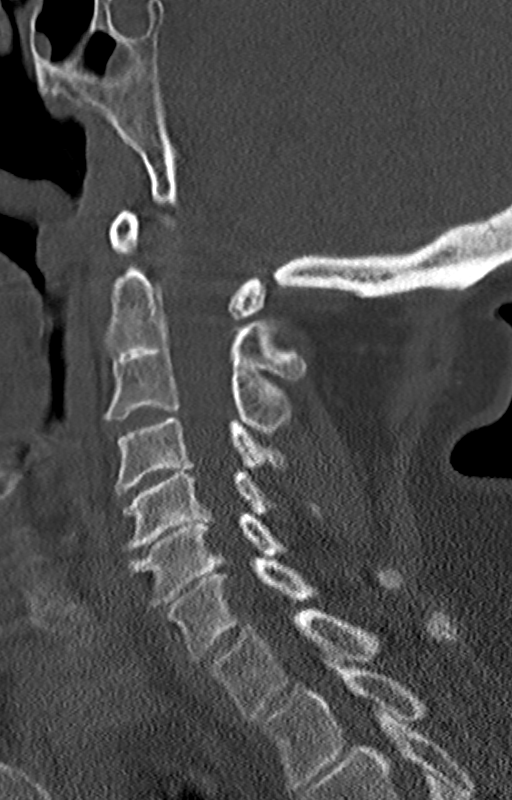
[im 41/61  bone]
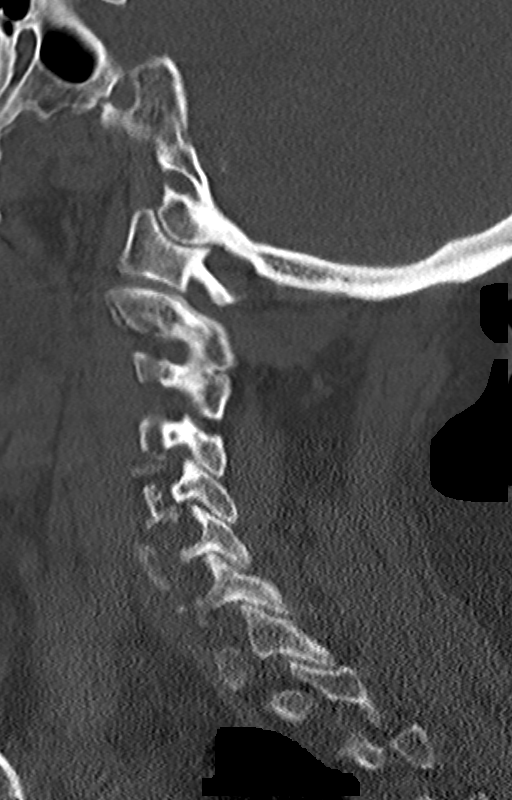

[Series 8: orthogonals · axial · 0.23mm/px · z∈[+1111,+1229]mm · 4 of 92 slices shown, 5 images]
[im 16/92  soft-tissue]
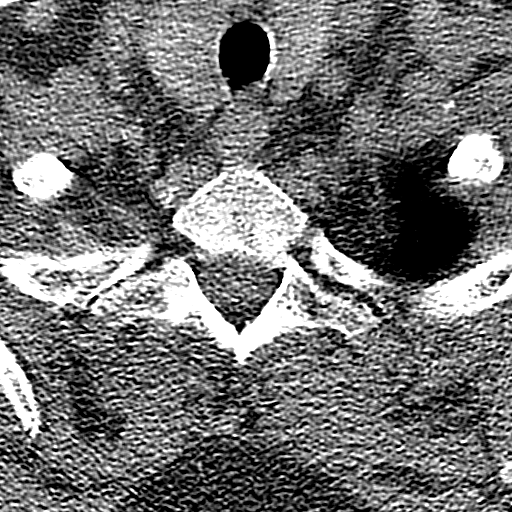
[im 16/92  bone]
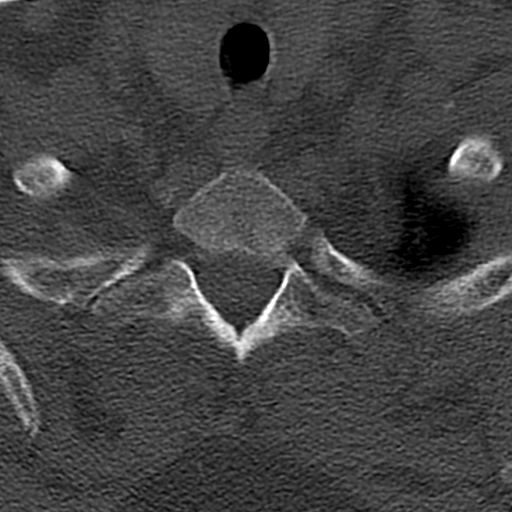
[im 31/92  bone]
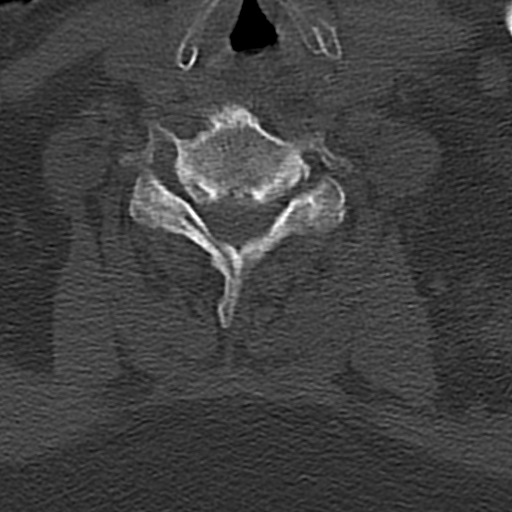
[im 61/92  bone]
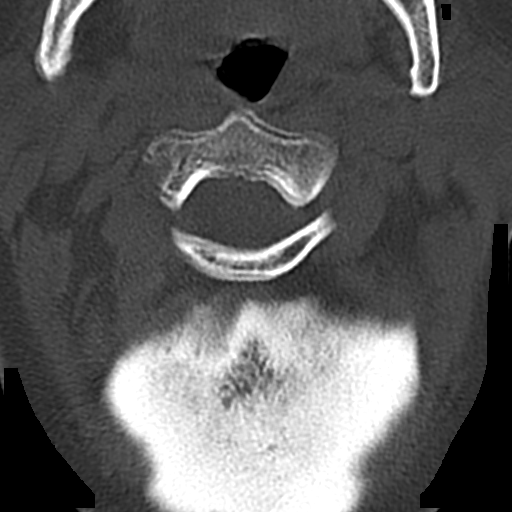
[im 76/92  bone]
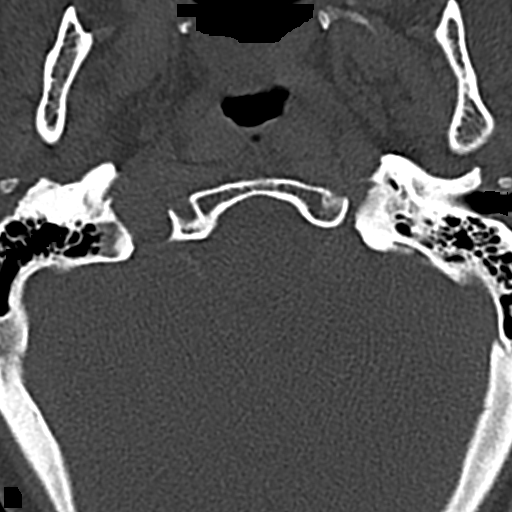

[12 of 35 positions shown; findings below may reference images not displayed]

FINDINGS: There is no evidence of fracture or subluxation. Vertebral bodies
demonstrate normal height and alignment. Multilevel disc space
narrowing is noted along the lower cervical spine, with anterior and
posterior disc osteophyte complexes. Prevertebral soft tissues are
within normal limits.

The thyroid gland is unremarkable in appearance. The visualized lung
apices are clear. Calcification is noted at the carotid
bifurcations, more prominent on the right. Mucosal thickening is
noted at the left side of the sphenoid sinus.
IMPRESSION: 1. No evidence of fracture or subluxation along the cervical spine.
2. Mild degenerative change along the lower cervical spine.
3. Calcification at the carotid bifurcations, more prominent on the
right. Carotid ultrasound would be helpful for further evaluation,
when and as deemed clinically appropriate.
4. Mucosal thickening at the left side of the sphenoid sinus.

## 2016-05-18 IMAGING — CT CT HEAD W/O CM
2 series · 16 of 30 positions shown, 20 images · non-contrast
Comparison: None.

CLINICAL DATA: Altered mental status, nonverbal with fixed right
gaze.

EXAM:
CT HEAD WITHOUT CONTRAST
TECHNIQUE: Contiguous axial images were obtained from the base of the skull
through the vertex without intravenous contrast.

[Series 201: head w/o, idose (1) · axial · non-contrast · 0.49mm/px · z∈[+86,+216]mm · 13 of 32 slices shown, 17 images]
[im 3/32  brain]
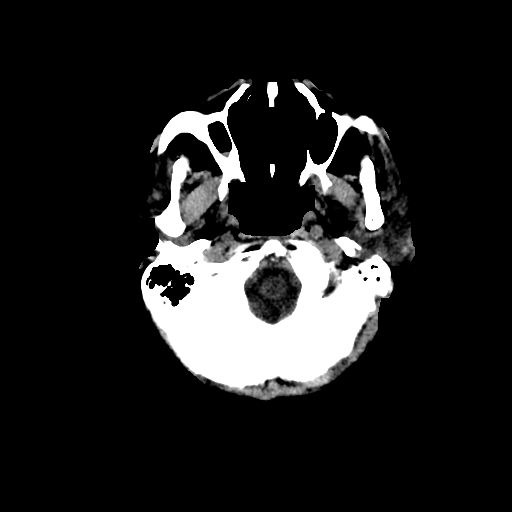
[im 3/32  bone]
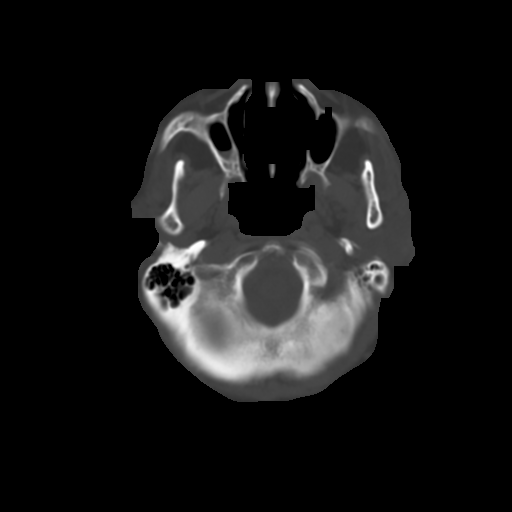
[im 5/32  brain]
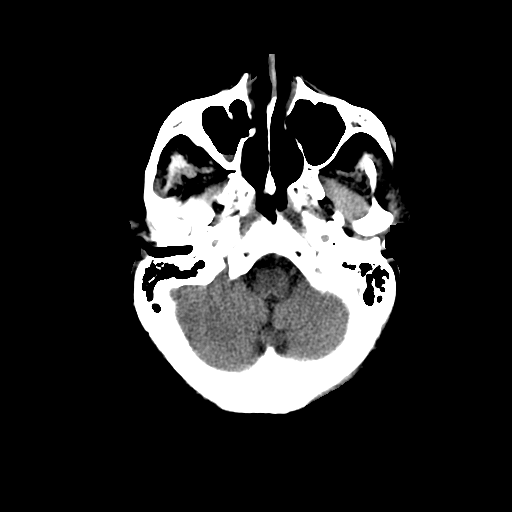
[im 7/32  brain]
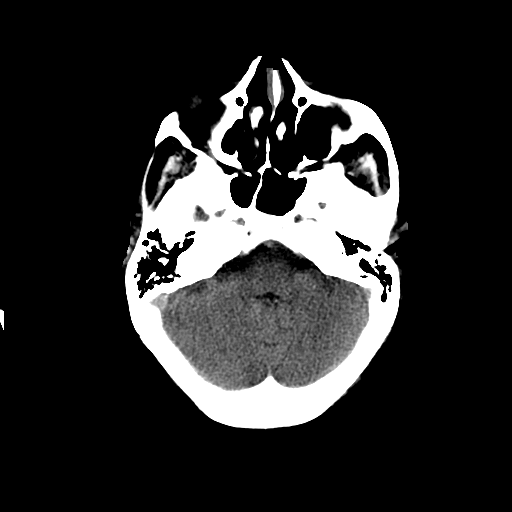
[im 9/32  brain]
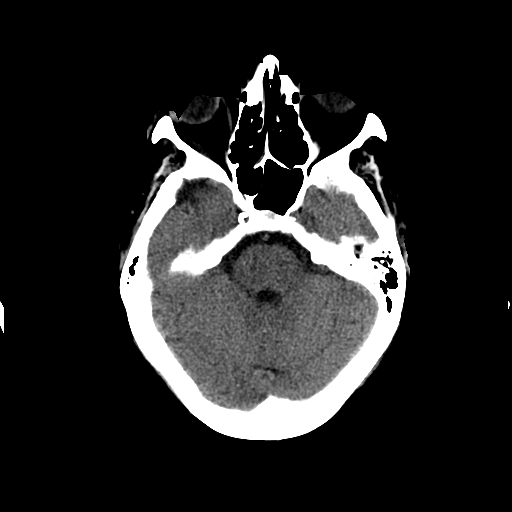
[im 12/32  brain]
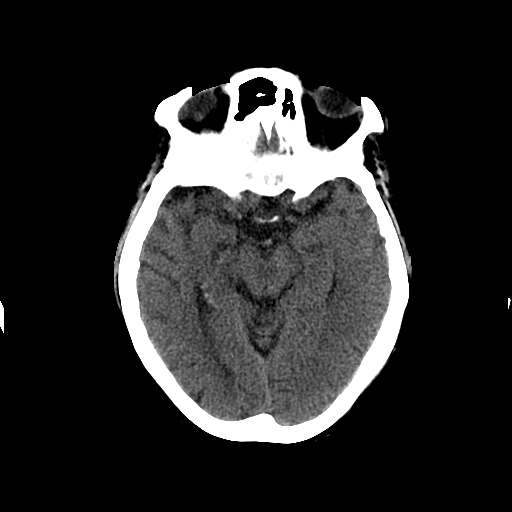
[im 12/32  bone]
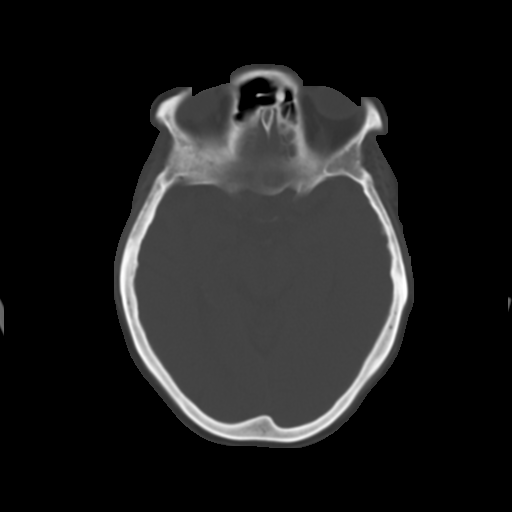
[im 14/32  brain]
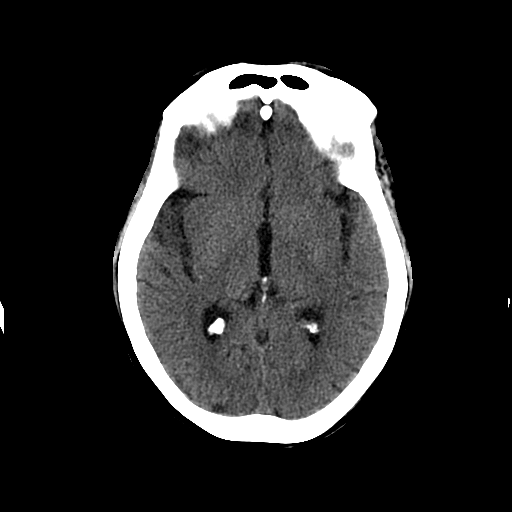
[im 16/32  brain]
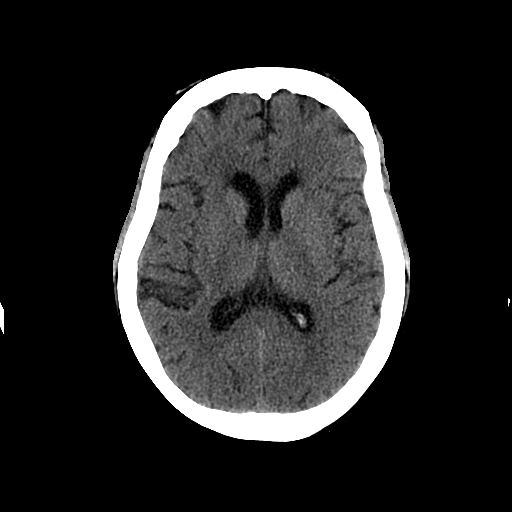
[im 18/32  brain]
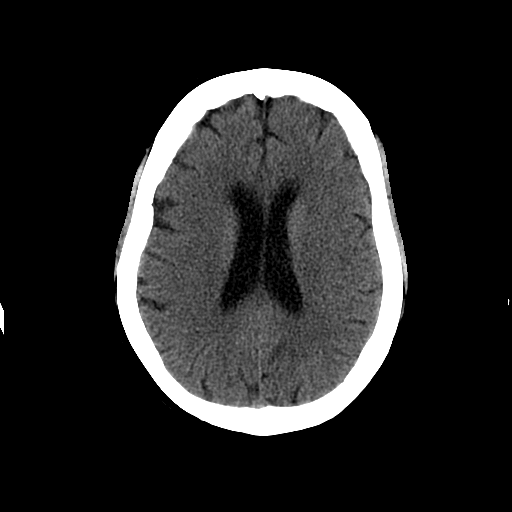
[im 20/32  brain]
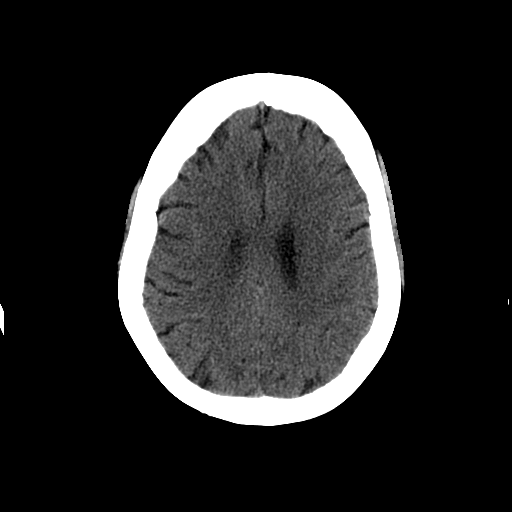
[im 20/32  bone]
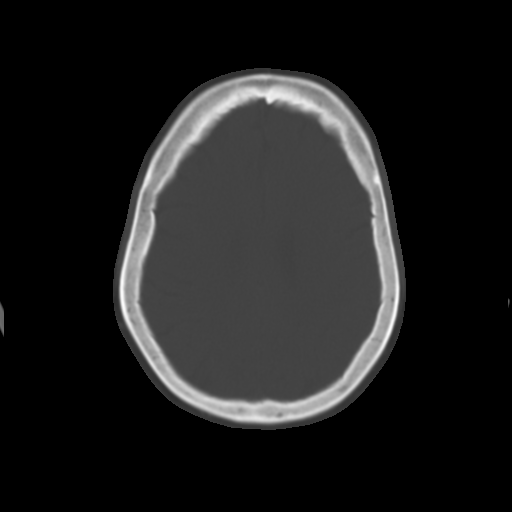
[im 23/32  brain]
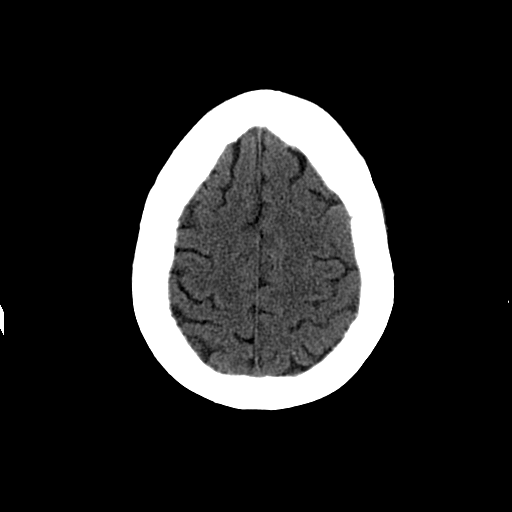
[im 25/32  brain]
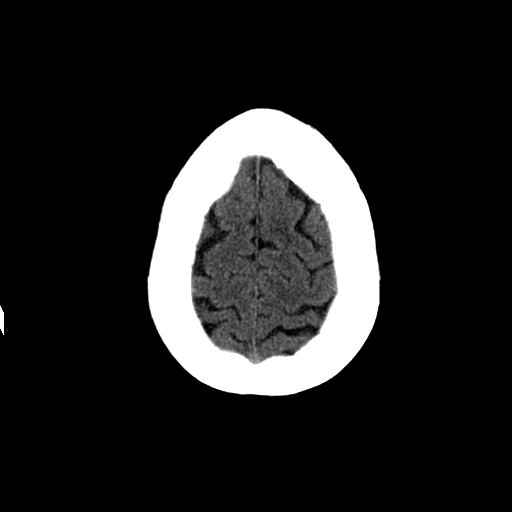
[im 27/32  brain]
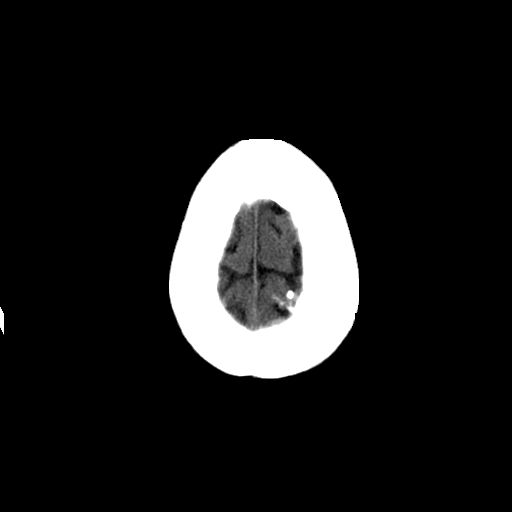
[im 29/32  brain]
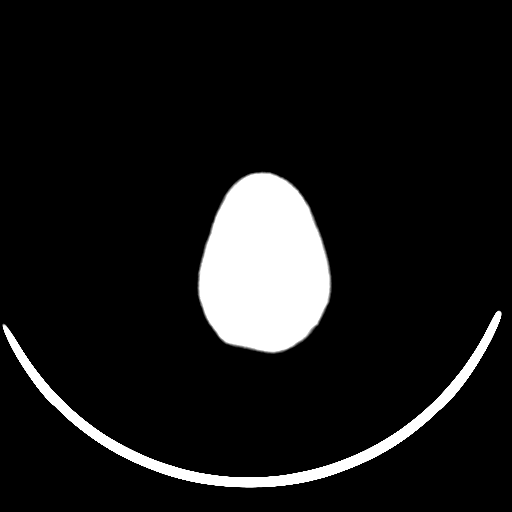
[im 29/32  bone]
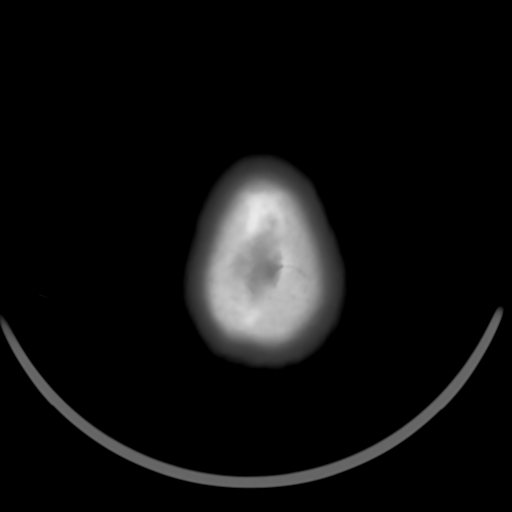

[Series 202: head w/o bone, idose (1) · axial · non-contrast · 0.49mm/px · z∈[+86,+131]mm · 3 of 32 slices shown]
[im 3/32  bone]
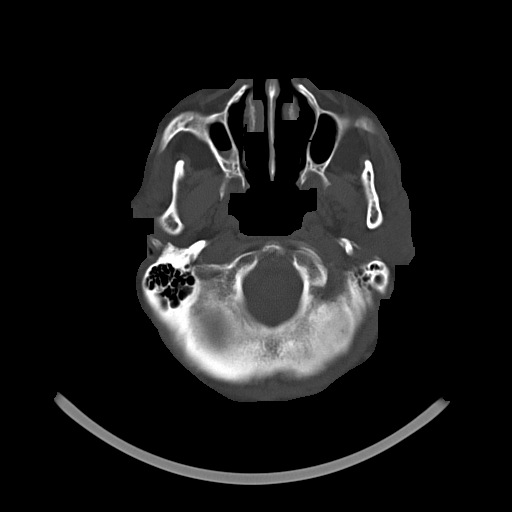
[im 7/32  bone]
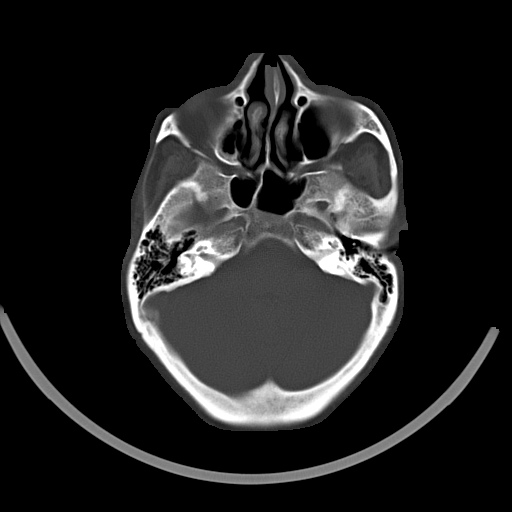
[im 12/32  bone]
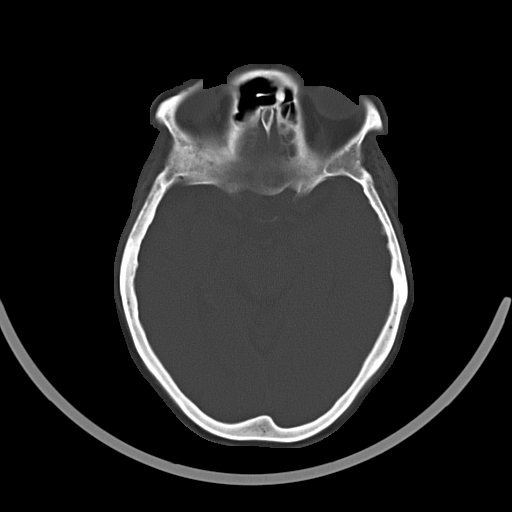

[16 of 30 positions shown; findings below may reference images not displayed]

FINDINGS: Skull and Sinuses:No fracture or aggressive lesion. Fluid level in
the right maxillary sinus and mild scattered inflammatory mucosal
thickening is likely incidental given the history.

Orbits: Rightward deviation of gaze.

Brain: No evidence of acute infarction, hemorrhage, hydrocephalus,
or mass lesion/mass effect. The insular ribbon on the left is
somewhat indistinct inferiorly, but symmetric. Mild periventricular
and subcortical left occipital low-density is most likely chronic
small vessel ischemia.
IMPRESSION: 1. No acute intracranial findings.
2. Mild chronic small vessel ischemia.

## 2016-06-14 ENCOUNTER — Telehealth: Payer: Self-pay | Admitting: Nurse Practitioner

## 2016-06-14 NOTE — Telephone Encounter (Signed)
Pt's granddaughter called said the pt is having seizures that have increased in frequency. She is requesting the pt be seen asap. Please call

## 2016-06-14 NOTE — Telephone Encounter (Signed)
LMVM for pts granddaughter to call back re: increased sz frequency.

## 2016-06-15 NOTE — Telephone Encounter (Signed)
Called again and LMVM for grand daughter (mobile and home same #).to return call re: pt having increased sz.

## 2016-06-16 NOTE — Telephone Encounter (Signed)
I spoke to grand daughter.   Pt has had issues with increased sz and having increased memory issues as well.  Pt has been taking her medications per grand daughter.   She had issue with scheduling appt (wanted to fit with her schedule)  I explained that we try to fit pt into MD or NP who has seen pt for continuity of care.  Offered several options that I had that were available within this nex week, she asked for next Friday, no availablity, Thursday Dr. Leonie Man had 230pm opening, I placed pt there, grand daughter to come as well.  1430 06-23-16.

## 2016-06-16 NOTE — Telephone Encounter (Signed)
Pt granddaughter called back in for Michelle Farrell, I was able to connect the call.

## 2016-06-23 ENCOUNTER — Encounter (INDEPENDENT_AMBULATORY_CARE_PROVIDER_SITE_OTHER): Payer: Self-pay

## 2016-06-23 ENCOUNTER — Encounter: Payer: Self-pay | Admitting: Neurology

## 2016-06-23 ENCOUNTER — Ambulatory Visit (INDEPENDENT_AMBULATORY_CARE_PROVIDER_SITE_OTHER): Payer: Medicare Other | Admitting: Neurology

## 2016-06-23 VITALS — BP 120/76 | HR 90 | Wt 142.8 lb

## 2016-06-23 DIAGNOSIS — G40919 Epilepsy, unspecified, intractable, without status epilepticus: Secondary | ICD-10-CM | POA: Diagnosis not present

## 2016-06-23 MED ORDER — LACOSAMIDE 50 MG PO TABS: 100.0000 mg | ORAL_TABLET | Freq: Two times a day (BID) | ORAL | Status: DC

## 2016-06-23 NOTE — Patient Instructions (Addendum)
I had a long discussion with the patient and her grand daughter and reviewed  medical records from her visit at [facility] earlier this month Clearly she had some breakthrough seizures in December 2017, February and March 2018  Even after Vimpat was added to [place]. She is also having prolonged post ictal cognitive sequelae following her seizures. I recommend increasing the Vimpat to 200 mg in the morning and 363m night for 2 weeks and if tolerated to 3072mtwice daily. Continue Keppra and the current dose of 500 mg twice daily. Repeat EEG study. Refer to Dr. AqDelice Leschor her opinion as epilepsy specialist. Continue Plavix for stroke prevention with strict control of hypertension with blood pressure goal below 130/90, lipids with LDL cholesterol goal below 70 mg percent.Return for f/u in 3 months or call earlier if necessary

## 2016-06-23 NOTE — Progress Notes (Signed)
[facility]  PATIENT: Michelle Farrell DOB: [birth year]   REASON FOR VISIT: Follow-up for seizure disorder history of stroke HISTORY FROM: Patient and caregiver    HISTORY OF PRESENT ILLNESS: HPI: PS74 year frail elderly African-American lady who is had 2 recent admissions to [facility] in March and June 2016. Patient is unable to provide history which is obtained from her granddaughter as well as review of electronic medical records. In March 2016 she was found unresponsive on the floor at home by her granddaughter. She she was admitted to [facility] where she was found to have evidence of urinary tract infection as well as sepsis. MRI scan of the brain showed a small left thalamic infarct which was felt not to be able to explain her neurological exam. She had a prolonged recovery and went to inpatient rehabilitation as well as [place] home and was quite weak and tired but able to ambulate. She was home barely few weeks while she was readmitted in June with again altered mental status. The granddaughter feels this may have been brought on by increasing dose of tramadol that she was taking for pain. She had a witnessed episode of seizure at that time which as per granddaughter's eyewitness account showed tonic movement in the right arm followed by clonic activity as well as head jerking and eyeballs rolling up lasting few minutes. She was started on Keppra 500 twice a day following this. EEG however was abnormal even in the first admission in March and Dr. Nicole Kindred read an EEG on 06/20/2014 showing periodic left sharp waves. Patient has continued to feel weak and lose weight overall. The last couple of weeks appetite has improved. 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 There is a tiny diffusion positive lesion in the left middle cerebral peduncle  which is artifact versus tiny infarct. Patient's granddaughter feels that she is quite sleepy during the day and this may be related to [place]. She is walking with a cane and has had no major falls. She's had no recurrent seizure episodes either. She is on Plavix for second stroke prevention she is tolerating well without bruising or bleeding and remains on Lipitor. Update 12/23/2014 PS: She returns for follow-up after initial consultation 2 months ago. She is accompanied by daughter. Patient has noticed increase in daytime alertness and more perspiration in activities since reducing the dose of Keppra from 500-to 250 in the morning. She is tolerating Keppra 500 mg at night which helps her sleep. She had outpatient EEG done on 10/24/14 which I personally reviewed and showed mild left greater than right hemispheric slowing but without definite seizure activity. Patient complains of occasional dizzy episodes and staggering but this is infrequent. The daughter feels this may be related to low blood pressure  . She is living at home. She is admits she is not eating well. She has no new complaints today. She remains on Plavix which is tolerating well without bleeding.   UPDATE 01/13/2016 [name], 76 year old female returns for follow-up with her caregiver. She has a history of seizure disorder with one seizure earlier in the week due to lack of sleep. She is currently on Keppra 500 twice daily and Vimpat 200 twice daily.EEG done on 10/24/14 which I personally reviewed and showed mild left greater than right hemispheric slowing but without definite seizure activity. She continues to live in her own  home with a paid caregiver. Granddaughter checks in frequently. She remains on Plavix for secondary stroke prevention. She has not had any bruising or bleeding. She returns for reevaluation Update 06/23/2016 : She returns for follow-up after last visit 6 months ago. She is a complete by granddaughter and caregiver. Patient  congested will have breakthrough seizures in December, February as well as 10 days ago. She was seen at [facility] emergency room and I have reviewed the records through care everywhere. Patient had basic lab work and urinalysis and chest x-ray which was unremarkable. She remains on heparin 500 twice daily as well as Vimpat 200 twice daily. The grand daughter herself dispenses her medications and feels she is been compliant. 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 She still not return back to her baseline following her last seizure 10 days ago. The patient requires 24-hour supervision at home and has caregivers but when she is well she can dress and feed herself and attend to her toilet needs. She needs some help with feeding. Patient did not do well on Mini-Mental status testing today and scored only 8/30. The patient's granddaughter appears frustrated with her recurrent seizures and would like referral to a epilepsy specialist. REVIEW OF SYSTEMS: Full 14 system review of systems performed and notable only for those listed, all others are neg:   Cough, memory loss, seizure, confusion and all other systems negative  ALLERGIES: Allergies  Allergen Reactions  . Tramadol     Seizures     HOME MEDICATIONS: Outpatient Medications Prior to Visit  Medication Sig Dispense Refill  . clopidogrel (PLAVIX) 75 MG tablet Take 1 tablet (75 mg total) by mouth daily. 30 tablet 0  . COREG CR 20 MG 24 hr capsule TK 1 C PO QAM WF  5  . hydrALAZINE (APRESOLINE) 25 MG tablet Take 25 mg by mouth.    . isosorbide mononitrate (IMDUR) 30 MG 24 hr tablet TK 1 T PO QD  0  . lacosamide (VIMPAT) 200 MG TABS tablet Take 1 tablet (200 mg total) by mouth 2 (two) times daily. 180 tablet 1  . levETIRAcetam (KEPPRA) 500 MG tablet Take 1 tablet (500 mg total) by mouth 2 (two) times daily. 180 tablet 3  . metoCLOPramide (REGLAN) 10 MG tablet Take 1  tablet (10 mg total) by mouth 3 (three) times daily as needed for nausea. 90 tablet 1  . acetaminophen (TYLENOL) 500 MG tablet Take 1,000 mg by mouth every 6 (six) hours as needed for mild pain or moderate pain.    Marland Kitchen atorvastatin (LIPITOR) 40 MG tablet Take 1 tablet (40 mg total) by mouth daily at 6 PM. (Patient not taking: Reported on 06/23/2016) 30 tablet 0  . Cholecalciferol (VITAMIN D) 2000 UNITS CAPS Take 1 capsule by mouth daily.    Marland Kitchen DOK 100 MG capsule TK ONE C PO BID  0  . ferrous sulfate 325 (65 FE) MG tablet TK 1 T PO D UTD  11  . fluticasone (FLONASE) 50 MCG/ACT nasal spray USE 1 SPRAY IN EACH NOSTRIL D  1  . folic acid (FOLVITE) 1 MG tablet Take 1 mg by mouth daily.    . isosorbide-hydrALAZINE (BIDIL) 20-37.5 MG per tablet Take 1 tablet by mouth 3 (three) times daily. (Patient not taking: Reported on 06/23/2016) 90 tablet 1  . ondansetron (ZOFRAN) 4 MG tablet Take 1 tablet (4 mg total) by mouth every 6 (six)  hours as needed for nausea. (Patient not taking: Reported on 06/23/2016) 20 tablet 0  . pantoprazole (PROTONIX) 40 MG tablet Take 1 tablet (40 mg total) by mouth 2 (two) times daily. 30 min prior to breakfast and 30 min prior to supper (Patient not taking: Reported on 06/23/2016) 90 tablet 3  . SENEXON-S 8.6-50 MG tablet TK 1 T PO  HS.  0  . vitamin B-12 1000 MCG tablet Take 1 tablet (1,000 mcg total) by mouth daily. (Patient not taking: Reported on 06/23/2016) 30 tablet 1   Facility-Administered Medications Prior to Visit  Medication Dose Route Frequency Provider Last Rate Last Dose  . sodium chloride 0.9 % injection 10-40 mL  10-40 mL Intracatheter PRN Ladene Artist, MD   10 mL at 07/09/14 0501    PAST MEDICAL HISTORY: Past Medical History:  Diagnosis Date  . AKI (acute kidney injury) ([place])   . Arthritis   . Back pain, chronic   . CHF (congestive heart failure) ([place])   . CKD (chronic kidney disease)   . COPD (chronic obstructive pulmonary disease) ([place])   . Crohn disease  ([place])   . Diabetes mellitus without complication ([place])   . Esophageal stricture    s/p dilation 2003, 2008  . GERD (gastroesophageal reflux disease)   . Hip pain, chronic   . Hypertension   . PUD (peptic ulcer disease)   . Seizures ([place])   . Sleep apnea   . Stroke [facility])     PAST SURGICAL HISTORY: Past Surgical History:  Procedure Laterality Date  . ABDOMINAL HYSTERECTOMY    . BLADDER SURGERY    . CHOLECYSTECTOMY    . ESOPHAGOGASTRODUODENOSCOPY N/A 07/08/2014   Procedure: ESOPHAGOGASTRODUODENOSCOPY (EGD);  Surgeon: Ladene Artist, MD;  Location: [facility] ENDOSCOPY;  Service: Endoscopy;  Laterality: N/A;  . TONSILLECTOMY      FAMILY HISTORY: Family History  Problem Relation Age of Onset  . Ovarian cancer Maternal Grandmother   . Stroke Mother   . Stroke Brother     SOCIAL HISTORY: Social History   Social History  . Marital status: Widowed    Spouse name: N/A  . Number of children: N/A  . Years of education: N/A   Occupational History  . Not on file.   Social History Main Topics  . Smoking status: Former Smoker    Packs/day: 1.00    Years: 30.00    Types: Cigarettes    Quit date: 05/27/2014  . Smokeless tobacco: Never Used  . Alcohol use No  . Drug use: No  . Sexual activity: No   Other Topics Concern  . Not on file   Social History Narrative   24 oz of caffeine a day    Lives alone at home occasionally with granddaughter     Widowed and disabled      PHYSICAL EXAM  Vitals:   06/23/16 1501  BP: 120/76  Pulse: 90  Weight: 142 lb 12.8 oz (64.8 kg)   Body mass index is 23.05 kg/m. General: Frail elderly African-American lady, seated, in no evident distress Head: head normocephalic and atraumatic.   Neck: supple with no carotid  bruits Cardiovascular: regular rate and rhythm, no murmurs Musculoskeletal: no deformity Skin:  no rash/petichiae Vascular:  Normal pulses all extremities Neurological examination  Mental Status: Awake and fully alert.  Oriented to place and person only.. Recent and remote memory Poor. Attention span, concentration and fund of knowledge diminished slightly.. Mood and affect appropriate. Mini-Mental status exam scored 8/30. Able  to name only 1 animal with 4 legs. Clock drawing 1/4. Unable to copy intersecting pentagons. Cranial Nerves: Fundoscopic exam reveals sharp disc margins. Pupils equal, briskly reactive to light. Extraocular movements full without nystagmus. Visual fields full to confrontation. Hearing intact. Facial sensation intact. Face, tongue, palate moves normally and symmetrically.  Motor: Normal bulk and tone. Normal strength in all tested extremity muscles. Diminished fine finger movements on the left. Orbits right over left . Sensory.: intact to touch , pinprick , position and vibratory sensation in the upper and lower extremities.  Coordination: Rapid alternating movements normal in all extremities. Finger-to-nose and heel-to-shin performed accurately bilaterally. Gait and Station: Arises from chair without difficulty. Stance is normal. Gait demonstrates normal stride length and balance uses a cane for walking.. Able to heel, toe and tandem walk with slight difficulty.  Reflexes: 1+ and symmetric. Toes downgoing.    DIAGNOSTIC DATA (LABS, IMAGING, TESTING) - I reviewed patient records, labs, notes, testing and imaging myself where available.  Lab Results  Component Value Date   WBC 10.1 09/13/2014   HGB 10.4 (L) 09/13/2014   HCT 29.9 (L) 09/13/2014   MCV 82.1 09/13/2014   PLT 304 09/13/2014      Component Value Date/Time   NA 136 09/15/2014 0635   K 3.4 (L) 09/15/2014 0635   CL 104 09/15/2014 0635   CO2 23 09/15/2014 0635   GLUCOSE 141 (H) 09/15/2014 0635   BUN <5 (L) 09/15/2014 0635   CREATININE 1.00 09/15/2014 0635   CALCIUM 10.0 09/15/2014 0635   PROT 6.3 (L) 09/12/2014 0853   ALBUMIN 3.3 (L) 09/12/2014 0853   AST 15 09/12/2014 0853   ALT 9 (L) 09/12/2014 0853   ALKPHOS 53  09/12/2014 0853   BILITOT 0.4 09/12/2014 0853   GFRNONAA 55 (L) 09/15/2014 0635   GFRAA >60 09/15/2014 0635   Lab Results  Component Value Date   CHOL 213 (H) 09/14/2014   HDL 35 (L) 09/14/2014   LDLCALC 138 (H) 09/14/2014   TRIG 200 (H) 09/14/2014   CHOLHDL 6.1 09/14/2014   Lab Results  Component Value Date   HGBA1C 5.3 06/20/2014   Lab Results  Component Value Date   VITAMINB12 229 07/06/2014   Lab Results  Component Value Date   TSH 0.491 07/05/2014      ASSESSMENT AND PLAN 38 year African-American lady with episodes of altered mental status with partial onset seizures in March and June 2016 in the setting of urinary tract infection with 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 Vascular risk factors of hyperlipidemia, hypertension, CHF. She is having recurrent breakthrough seizures despite being on Keppra and Vimpat   PLAN: had a long discussion with the patient and her grand daughter and reviewed  medical records from her visit at [facility] earlier this month Clearly she had some breakthrough seizures in December 2017, February and March 2018  Even after Vimpat was added to [place]. She is also having prolonged post ictal cognitive sequelae following her seizures. I recommend increasing the Vimpat to 200 mg in the morning and 377m night for 2 weeks and if tolerated to 3026mtwice daily. Continue Keppra and the current dose of 500 mg twice daily. Repeat EEG study. Refer to Dr. AqDelice Leschor her opinion as epilepsy specialist. Continue Plavix for stroke prevention with strict control of hypertension with blood pressure goal below 130/90, lipids with LDL cholesterol goal below 70 mg percent.Return for f/u in 3 months or call earlier if necessary  Antony Contras, MD [facility] [address] [place], [place] [address] [phone]  I reviewed the above note and documentation by the Nurse Practitioner and agree with the  history, physical exam, assessment and plan as outlined above. I was immediately available for face-to-face consultation. Star Age, MD, PhD [facility])

## 2016-07-14 ENCOUNTER — Ambulatory Visit: Payer: Medicare Other | Admitting: Nurse Practitioner

## 2016-07-20 ENCOUNTER — Other Ambulatory Visit: Payer: Self-pay | Admitting: Nurse Practitioner

## 2016-07-20 ENCOUNTER — Telehealth: Payer: Self-pay | Admitting: Neurology

## 2016-07-20 ENCOUNTER — Other Ambulatory Visit: Payer: Self-pay

## 2016-07-20 DIAGNOSIS — R569 Unspecified convulsions: Secondary | ICD-10-CM

## 2016-07-20 MED ORDER — LACOSAMIDE 200 MG PO TABS: 200.0000 mg | ORAL_TABLET | Freq: Two times a day (BID) | ORAL | 1 refills | Status: DC

## 2016-07-20 NOTE — Telephone Encounter (Signed)
Vimpat refill print out. Medication sign by Dr. Leonie Man. Will be fax to pharmacy.

## 2016-07-20 NOTE — Telephone Encounter (Signed)
Patients granddaughter (listed) on DPR is requseting refill for lacosamide (VIMPAT) 200 MG TABS tablet.  Grandaughter states patient is out of the medication and there has been a change in the dosage at patients last visit seen.

## 2016-09-11 ENCOUNTER — Telehealth: Payer: Self-pay | Admitting: Neurology

## 2016-09-11 NOTE — Telephone Encounter (Signed)
Was called Sunday night by [facility] ED.    She had a prolonged seizure.   Ativan was given x 2 and it stopped.   She also received additional IV Keppra.   She was lethargic but responsive   Advised to increase Keppra to 1000 mg po bid (was 500 mg po bid)

## 2016-09-12 ENCOUNTER — Other Ambulatory Visit: Payer: Self-pay | Admitting: Neurology

## 2016-09-12 MED ORDER — LACOSAMIDE 150 MG PO TABS: 300.0000 mg | ORAL_TABLET | Freq: Two times a day (BID) | ORAL | 1 refills | Status: DC

## 2016-09-12 NOTE — Telephone Encounter (Signed)
Pt's granddaughter said vimpat is being called in with incorrect directions. Per OV 06/23/16- to increased to 267m am and 300 pm for 2 weeks and then increase to 3022mbid. [facility]

## 2016-09-12 NOTE — Telephone Encounter (Signed)
A prescription was written for Vimpat for 150 mg tablets to take 2 twice daily.

## 2016-09-12 NOTE — Telephone Encounter (Signed)
Message sent to Dr. Jannifer Franklin to do another prescription of vimpat 33m bid per Dr. SLeonie Mannote.

## 2016-09-13 ENCOUNTER — Telehealth: Payer: Self-pay

## 2016-09-13 NOTE — Telephone Encounter (Addendum)
Rn receive fax that vimpat is requiring a PA. PT was on 200 mg, one tablet bid, per Dr. Leonie Man last note she should increase to 378m one tablet twice a day ongoing. Rn call pharmacy and they stated the PA was prompt because of the 2 tabs in the am and 2 tabs at night of 1518m Pt was only paying 8.00 dollars when she was taking 200 in the am and 200 in the pm. Rn stated PA was done on cover my meds, and is pending.

## 2016-09-13 NOTE — Telephone Encounter (Signed)
Rn fax printed copy of vimpat to [facility] at [phone]. Pt will take 2 tablets of 150 twice a day which will equal 600 daily. Rx fax twice and receive.

## 2016-09-13 NOTE — Telephone Encounter (Signed)
Revised note.

## 2016-09-14 NOTE — Telephone Encounter (Signed)
[place] to initiate expedited appeal for Vimpat. Spoke with Jasmonique and gave information. Was placed on hold, held phone 22 minutes. Will call back.

## 2016-09-14 NOTE — Telephone Encounter (Signed)
Received fax from [place] requesting further information. Paperwork completed, faxed to [facility] Rx.

## 2016-09-14 NOTE — Telephone Encounter (Addendum)
Called patient's granddaughter, Michelle Farrell to be certain patient has enough Vimpat while trying to get increased dose approved through her insurance.  Michelle Farrell stated that patient does and is also taking Keppra 100 twice daily. Noted that on patient's medication list. Michelle Farrell stated patient has been on increased dose of Vimpat for a while, but new Rx sent in was for previous dose. She stated that is why patient is running out of medication soon. She stated patient does have enough Vimpat and Keppra to last one more week at her current doses. This RN advised Michelle Farrell that the PA was denied, and this RN is trying to get expedited appeal. Advised her that this RN will continue to work on appeal tomorrow. Michelle Farrell verbalized understanding, appreciation of call.

## 2016-09-15 NOTE — Telephone Encounter (Addendum)
[place], spoke with Tiffany who stated vimpat is tier 4 medication, stated the quantity is a quantity exception. She stated this RN needed to speak to someone on providers line.  Transferred to provider line of [employer] rx  PA dept, and spoke with William Hamburger. She transferred this RN to a PA technician, Jenny Reichmann with [employer] who began an expedited appeal. All requested information provided to [place]. She requested all documentation re: office notes, documentation of increased seizures, need for increase in medication, other seizure medications patient takes be faxed to [facility] and [employer], fax [phone].  This was done. Jenny Reichmann stated it may take 24-72 hours for expedited appeal to be determined.

## 2016-09-15 NOTE — Telephone Encounter (Signed)
[facility] [phone] called to advise medication has been approved.

## 2016-09-15 NOTE — Telephone Encounter (Signed)
Received call back from [place]  with questions about patient's diagnoses.  Gave him patient's diagnoses from office notes. He verbalized understanding.

## 2016-09-19 NOTE — Telephone Encounter (Signed)
Ok thanks 

## 2016-09-29 NOTE — Telephone Encounter (Addendum)
Received letter of approval, after appeal, from [facility] through [facility] [phone]).  Case # [id number].  Authorization #[id number].  Approval valid through 09/13/16.  Member [id number]#[id number].

## 2016-10-07 ENCOUNTER — Other Ambulatory Visit: Payer: Self-pay | Admitting: Neurology

## 2016-10-07 MED ORDER — LACOSAMIDE 200 MG PO TABS: 200.0000 mg | ORAL_TABLET | Freq: Every day | ORAL | 0 refills | Status: DC

## 2016-10-07 MED ORDER — LACOSAMIDE 150 MG PO TABS: 300.0000 mg | ORAL_TABLET | Freq: Every day | ORAL | 1 refills | Status: DC

## 2016-10-07 MED ORDER — LEVETIRACETAM 1000 MG PO TABS: 1000.0000 mg | ORAL_TABLET | Freq: Two times a day (BID) | ORAL | 0 refills | Status: DC

## 2016-10-07 NOTE — Telephone Encounter (Signed)
Dr Jannifer Franklin- you are listed as the [facility] today. This is a Dr Leonie Man patient. Are you ok with this?

## 2016-10-07 NOTE — Telephone Encounter (Signed)
Faxed printed/signed rx vimpat to [facility], [place]. Fax: [phone]. Received confirmation.

## 2016-10-07 NOTE — Addendum Note (Signed)
Addended by: Kathrynn Ducking on: 10/07/2016 09:24 AM   Modules accepted: Orders

## 2016-10-07 NOTE — Telephone Encounter (Signed)
I have called in a prescription for Keppra 1000 mg twice daily, 60 given and no refills. This was called in to the [facility] in [place], [place]. Telephone number is [phone].  The patient also needs a prescription for the 200 mg tablet of Vimpat to take 1 in the morning. The patient is on 2 of the 150 tablets at night, she is taking a total of 500 mg daily of Vimpat.

## 2016-10-07 NOTE — Telephone Encounter (Signed)
Patients grand daughter Bartolo Darter (listed on DPR) called office requesting refill for patient's Keppa.  Patent was hospitalilized at [facility] and dosage has been increased 1,000 two times daily.  Patient is out of town and needs Keppa called to [facility] [phone].  Patient out of medication.  Please call

## 2016-10-11 ENCOUNTER — Encounter: Payer: Self-pay | Admitting: Neurology

## 2016-10-11 ENCOUNTER — Ambulatory Visit (INDEPENDENT_AMBULATORY_CARE_PROVIDER_SITE_OTHER): Payer: Medicare Other | Admitting: Neurology

## 2016-10-11 VITALS — BP 160/100 | HR 80 | Wt 153.2 lb

## 2016-10-11 DIAGNOSIS — G40019 Localization-related (focal) (partial) idiopathic epilepsy and epileptic syndromes with seizures of localized onset, intractable, without status epilepticus: Secondary | ICD-10-CM | POA: Diagnosis not present

## 2016-10-11 NOTE — Patient Instructions (Signed)
I had a long discussion the patient and her daughter regarding her breakthrough seizures which continue on current medication regime. Agree with increasing Keppra dose to 1 g twice daily and continue Vimpat 200 mg the morning and 300 mg at night. Avoid seizure triggers like sleep deprivation, medication noncompliance. Patient was advised to keep appointment with epileptologist Dr. Joesph July at [facility] in September for second opinion. She will return for follow-up in 6 months with treatment as practitioner call earlier if necessary

## 2016-10-11 NOTE — Progress Notes (Addendum)
[facility]  PATIENT: Michelle Farrell DOB: 10/12/1940   REASON FOR VISIT: Follow-up for seizure disorder history of stroke HISTORY FROM: Patient and caregiver    HISTORY OF PRESENT ILLNESS: HPI: PS74 year frail elderly African-American lady who is had 2 recent admissions to [facility] in March and June 2016. Patient is unable to provide history which is obtained from her granddaughter as well as review of electronic medical records. In March 2016 she was found unresponsive on the floor at home by her granddaughter. She she was admitted to [facility] where she was found to have evidence of urinary tract infection as well as sepsis. MRI scan of the brain showed a small left thalamic infarct which was felt not to be able to explain her neurological exam. She had a prolonged recovery and went to inpatient rehabilitation as well as [place] home and was quite weak and tired but able to ambulate. She was home barely few weeks while she was readmitted in June with again altered mental status. The granddaughter feels this may have been brought on by increasing dose of tramadol that she was taking for pain. She had a witnessed episode of seizure at that time which as per granddaughter's eyewitness account showed tonic movement in the right arm followed by clonic activity as well as head jerking and eyeballs rolling up lasting few minutes. She was started on Keppra 500 twice a day following this. EEG however was abnormal even in the first admission in March and Dr. Nicole Farrell read an EEG on 06/20/2014 showing periodic left sharp waves. Patient has continued to feel weak and lose weight overall. The last couple of weeks appetite has improved. 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 There is a tiny diffusion positive lesion in the left middle cerebral peduncle  which is artifact versus tiny infarct. Patient's granddaughter feels that she is quite sleepy during the day and this may be related to [place]. She is walking with a cane and has had no major falls. She's had no recurrent seizure episodes either. She is on Plavix for second stroke prevention she is tolerating well without bruising or bleeding and remains on Lipitor. Update 12/23/2014 PS: She returns for follow-up after initial consultation 2 months ago. She is accompanied by daughter. Patient has noticed increase in daytime alertness and more perspiration in activities since reducing the dose of Keppra from 500-to 250 in the morning. She is tolerating Keppra 500 mg at night which helps her sleep. She had outpatient EEG done on 10/24/14 which I personally reviewed and showed mild left greater than right hemispheric slowing but without definite seizure activity. Patient complains of occasional dizzy episodes and staggering but this is infrequent. The daughter feels this may be related to low blood pressure  . She is living at home. She is admits she is not eating well. She has no new complaints today. She remains on Plavix which is tolerating well without bleeding.   UPDATE 01/13/2016 Michelle Farrell, 76 year old female returns for follow-up with her caregiver. She has a history of seizure disorder with one seizure earlier in the week due to lack of sleep. She is currently on Keppra 500 twice daily and Vimpat 200 twice daily.EEG done on 10/24/14 which I personally reviewed and showed mild left greater than right hemispheric slowing but without definite seizure activity. She continues to live in her own  home with a paid caregiver. Granddaughter checks in frequently. She remains on Plavix for secondary stroke prevention. She has not had any bruising or bleeding. She returns for reevaluation Update 06/23/2016 : She returns for follow-up after last visit 6 months ago. She is a complete by granddaughter and caregiver. Patient  congested will have breakthrough seizures in December, February as well as 10 days ago. She was seen at [facility] emergency room and I have reviewed the records through care everywhere. Patient had basic lab work and urinalysis and chest x-ray which was unremarkable. She remains on heparin 500 twice daily as well as Vimpat 200 twice daily. The grand daughter herself dispenses her medications and feels she is been compliant. 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 She still not return back to her baseline following her last seizure 10 days ago. The patient requires 24-hour supervision at home and has caregivers but when she is well she can dress and feed herself and attend to her toilet needs. She needs some help with feeding. Patient did not do well on Mini-Mental status testing today and scored only 8/30. The patient's granddaughter appears frustrated with her recurrent seizures. and would like referral to a epilepsy specialist . Update 10/11/2016 :She returns for follow-up after last visit 3 months ago. She is accompanied by her daughter. Patient had another breakthrough seizure in June and was seen at [facility]. There was no obvious triggers or precipitating event for her seizure. I do not have the records to review today. She was asked to increase the dose of Keppra to 1 g twice daily. She remains on Vimpat 200 mg in the morning and 300 mg at night. She seems to be tolerating both medications very well. She took her 5-7 days to recover fully from her post pill cognitive sequelae that she usually gets. She has an appointment to see epileptologist Michelle Farrell at [facility] in September. Patient continues to live at home. She has 24-hour care at home. She is independent in most activities of daily living but needs some help with cooking and bathing. She has not been driving. She has no new complaints today. She states  she's been compliant with her medication and has not missed any doses.  REVIEW OF SYSTEMS: Full 14 system review of systems performed and notable only for those listed, all others are neg:     Hearing loss, choking, headache, seizure and all other systems negative  ALLERGIES: Allergies  Allergen Reactions  . Tramadol     Seizures     HOME MEDICATIONS: Outpatient Medications Prior to Visit  Medication Sig Dispense Refill  . clopidogrel (PLAVIX) 75 MG tablet Take 1 tablet (75 mg total) by mouth daily. 30 tablet 0  . hydrALAZINE (APRESOLINE) 25 MG tablet Take 25 mg by mouth.    . isosorbide mononitrate (IMDUR) 30 MG 24 hr tablet TK 1 T PO QD  0  . Lacosamide (VIMPAT) 150 MG TABS Take 2 tablets (300 mg total) by mouth at bedtime. 360 tablet 1  . lacosamide (VIMPAT) 200 MG TABS tablet Take 1 tablet (200 mg total) by mouth daily. 30 tablet 0  . levETIRAcetam (KEPPRA) 1000 MG tablet Take 1 tablet (1,000 mg total) by mouth 2 (two) times daily. 60 tablet 0  . COREG CR 20 MG 24 hr capsule TK 1 C PO QAM WF  5  . metoCLOPramide (REGLAN) 10 MG tablet  Take 1 tablet (10 mg total) by mouth 3 (three) times daily as needed for nausea. (Patient not taking: Reported on 10/11/2016) 90 tablet 1   Facility-Administered Medications Prior to Visit  Medication Dose Route Frequency Provider Last Rate Last Dose  . sodium chloride 0.9 % injection 10-40 mL  10-40 mL Intracatheter PRN Ladene Artist, MD   10 mL at 07/09/14 0501    PAST MEDICAL HISTORY: Past Medical History:  Diagnosis Date  . AKI (acute kidney injury) ([place])   . Arthritis   . Back pain, chronic   . CHF (congestive heart failure) ([place])   . CKD (chronic kidney disease)   . COPD (chronic obstructive pulmonary disease) ([place])   . Crohn disease ([place])   . Diabetes mellitus without complication ([place])   . Esophageal stricture    s/p dilation 2003, 2008  . GERD (gastroesophageal reflux disease)   . Hip pain, chronic   . Hypertension   . PUD  (peptic ulcer disease)   . Seizures ([place])   . Sleep apnea   . Stroke [facility])     PAST SURGICAL HISTORY: Past Surgical History:  Procedure Laterality Date  . ABDOMINAL HYSTERECTOMY    . BLADDER SURGERY    . CHOLECYSTECTOMY    . ESOPHAGOGASTRODUODENOSCOPY N/A 07/08/2014   Procedure: ESOPHAGOGASTRODUODENOSCOPY (EGD);  Surgeon: Ladene Artist, MD;  Location: [facility] ENDOSCOPY;  Service: Endoscopy;  Laterality: N/A;  . TONSILLECTOMY      FAMILY HISTORY: Family History  Problem Relation Age of Onset  . Ovarian cancer Maternal Grandmother   . Stroke Mother   . Stroke Brother     SOCIAL HISTORY: Social History   Social History  . Marital status: Widowed    Spouse name: N/A  . Number of children: N/A  . Years of education: N/A   Occupational History  . Not on file.   Social History Main Topics  . Smoking status: Former Smoker    Packs/day: 1.00    Years: 30.00    Types: Cigarettes    Quit date: 05/27/2014  . Smokeless tobacco: Never Used  . Alcohol use No  . Drug use: No  . Sexual activity: No   Other Topics Concern  . Not on file   Social History Narrative   24 oz of caffeine a day    Lives alone at home occasionally with granddaughter     Widowed and disabled      PHYSICAL EXAM  Vitals:   10/11/16 1603  BP: (!) 160/100  Pulse: 80  Weight: 153 lb 3.2 oz (69.5 kg)   Body mass index is 24.73 kg/m. General: Frail elderly African-American lady, seated, in no evident distress Head: head normocephalic and atraumatic.   Neck: supple with no carotid  bruits Cardiovascular: regular rate and rhythm, no murmurs Musculoskeletal: no deformity Skin:  no rash/petichiae Vascular:  Normal pulses all extremities Neurological examination  Mental Status: Awake and fully alert. Oriented to place and person only.. Recent and remote memory Poor. Attention span, concentration and fund of knowledge diminished slightly.. Mood and affect appropriate. Mini-Mental status exam  Not done  Able to name only 1 animal with 4 legs. Clock drawing 1/4. Unable to copy intersecting pentagons. Cranial Nerves: Fundoscopic exam reveals sharp disc margins. Pupils equal, briskly reactive to light. Extraocular movements full without nystagmus. Visual fields full to confrontation. Hearing intact. Facial sensation intact. Face, tongue, palate moves normally and symmetrically.  Motor: Normal bulk and tone. Normal strength in all tested extremity muscles.  Diminished fine finger movements on the left. Orbits right over left . Sensory.: intact to touch , pinprick , position and vibratory sensation in the upper and lower extremities.  Coordination: Rapid alternating movements normal in all extremities. Finger-to-nose and heel-to-shin performed accurately bilaterally. Gait and Station: Arises from chair without difficulty. Stance is normal. Gait demonstrates normal stride length and balance uses a cane for walking.. Able to heel, toe and tandem walk with  moderate difficulty.  Reflexes: 1+ and symmetric. Toes downgoing.    DIAGNOSTIC DATA (LABS, IMAGING, TESTING) - I reviewed patient records, labs, notes, testing and imaging myself where available.  Lab Results  Component Value Date   WBC 10.1 09/13/2014   HGB 10.4 (L) 09/13/2014   HCT 29.9 (L) 09/13/2014   MCV 82.1 09/13/2014   PLT 304 09/13/2014      Component Value Date/Time   NA 136 09/15/2014 0635   K 3.4 (L) 09/15/2014 0635   CL 104 09/15/2014 0635   CO2 23 09/15/2014 0635   GLUCOSE 141 (H) 09/15/2014 0635   BUN <5 (L) 09/15/2014 0635   CREATININE 1.00 09/15/2014 0635   CALCIUM 10.0 09/15/2014 0635   PROT 6.3 (L) 09/12/2014 0853   ALBUMIN 3.3 (L) 09/12/2014 0853   AST 15 09/12/2014 0853   ALT 9 (L) 09/12/2014 0853   ALKPHOS 53 09/12/2014 0853   BILITOT 0.4 09/12/2014 0853   GFRNONAA 55 (L) 09/15/2014 0635   GFRAA >60 09/15/2014 0635   Lab Results  Component Value Date   CHOL 213 (H) 09/14/2014   HDL 35 (L) 09/14/2014    LDLCALC 138 (H) 09/14/2014   TRIG 200 (H) 09/14/2014   CHOLHDL 6.1 09/14/2014   Lab Results  Component Value Date   HGBA1C 5.3 06/20/2014   Lab Results  Component Value Date   VITAMINB12 229 07/06/2014   Lab Results  Component Value Date   TSH 0.491 07/05/2014      ASSESSMENT AND PLAN 59 year African-American lady with episodes of altered mental status with partial onset seizures in March and June 2016 in the setting of urinary tract infection with 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 Vascular risk factors of hyperlipidemia, hypertension, CHF. She is having recurrent breakthrough seizures despite being on Keppra and Vimpat   PLAN:  I had a long discussion the patient and her daughter regarding her breakthrough seizures which continue on current medication regime. Agree with increasing Keppra dose to 1 g twice daily and continue Vimpat 200 mg the morning and 300 mg at night. Avoid seizure triggers like sleep deprivation, medication noncompliance. Patient was advised to keep appointment with epileptologist Dr. Joesph July at [facility] in September for second opinion. Greater than 50% time during this 25 minute visit was spent on counseling and coordination of care about her breakthrough seizures and answering questions She will return for follow-up in 6 months with treatment as practitioner call earlier if necessary   Antony Contras, MD [facility] [address] [place], [place] [address] [phone]  I reviewed the above note and documentation by the Nurse Practitioner and agree with the history, physical exam, assessment and plan as outlined above. I was immediately available for face-to-face consultation. Star Age, MD, PhD [facility])

## 2016-11-03 ENCOUNTER — Telehealth: Payer: Self-pay | Admitting: Neurology

## 2016-11-03 NOTE — Telephone Encounter (Signed)
DR. Leonie Man reviewed patients labs, Adjustments will be made on patients medications.

## 2016-11-03 NOTE — Telephone Encounter (Signed)
[facility] labs were drawn on 7/5 and again on 8/3 at their facility and the keppra levels have gone up. She is faxing labs to RN/Katrina

## 2016-11-04 ENCOUNTER — Other Ambulatory Visit: Payer: Self-pay | Admitting: Neurology

## 2016-11-04 NOTE — Telephone Encounter (Signed)
Yes due to elevated Keppra levels I recommend reducing the dose of Keppra to 500 mg in the morning and thousand milligrams at night

## 2016-11-07 ENCOUNTER — Other Ambulatory Visit: Payer: Self-pay

## 2016-11-07 MED ORDER — LEVETIRACETAM 1000 MG PO TABS: ORAL_TABLET | ORAL | 11 refills | Status: DC

## 2016-11-07 MED ORDER — LEVETIRACETAM 1000 MG PO TABS: 1000.0000 mg | ORAL_TABLET | Freq: Two times a day (BID) | ORAL | 11 refills | Status: DC

## 2016-11-07 NOTE — Telephone Encounter (Signed)
Garvin Fila, MD  You; Hope Pigeon, RN 3 days ago      Yes due to elevated Keppra levels I recommend reducing the dose of Keppra to 500 mg in the morning and thousand milligrams at night

## 2016-11-07 NOTE — Addendum Note (Signed)
Addended by: Marcial Pacas on: 11/07/2016 02:31 PM   Modules accepted: Orders

## 2016-11-07 NOTE — Telephone Encounter (Signed)
Left vm for patients granddaughter on dpr to call back. Rn left vm there has been an adjustment on her medication due to the keppra levels. Rn advised to call back during business hours.

## 2016-11-08 NOTE — Telephone Encounter (Signed)
RN spoke with patients granddaughter Michelle Farrell about her grandmothers keppra adjustment. Rn stated a fax was receive on 11/03/2016 that the keppra levels were 52.2 on 09/29/2016,and 69.0 on 10/28/2016. Rn stated  Dr. Leonie Man recommend the keppra be decrease to 564m in the am, and 10075mat night. Michelle Farrell stated she just had concerns because her grandmother has been doing well on the current dosage of 100065mn the am, and 1000m53m night. She also stated the vimpat dosage was never sent to the pharmacy. She is giving her grandmother 225mg37mthe am, and 300 at night.  Rn stated per Dr. SethiLeonie Man note in July 2018 he wanted pt to continue vimpat 200mg 18mhe am, and 300mg a6mght. Also keppra 1000mg in44m am, and 1000mg qhs9monna stated she felt nervous about adjusting her keppra to 500mg in t72mm.1000qhs,because she does not want her to have another seizure.  Michelle Farrell would like to keep her mom the current regimen of keppra 1000mg am, 122mhs, vimpat 225 mg, and 300 qhs until DR. Sethi returLeonie Manck 11/14/2016. Rn stated a message will be sent to Dr. Sethi to geLeonie Man opinion on the regimen. Rn stated a new rx of vimpat can be sent once pt refills are sent out.

## 2016-11-15 NOTE — Telephone Encounter (Signed)
RN call patients grand daughter back [name] about Dr. Leonie Man message. Rn stated its okay per Dr. Leonie Man if she does not want to decrease the keppra dosage to 500 in am and 1000 at night. Pt will continue the dosage of keppra 1024m am and 10062mat night. [facility]. Pts daughter will call back after appt if [facility] given the keppra levels per her request. Rn stated if she needs another refill because or have any issues call our office. Pts daughter verbalized understanding.

## 2016-11-15 NOTE — Telephone Encounter (Signed)
Okay to continue the present dose of Keppra and not reduce it and I will discuss this further with the patient's granddaughter at the next office visit

## 2017-01-21 ENCOUNTER — Encounter (HOSPITAL_BASED_OUTPATIENT_CLINIC_OR_DEPARTMENT_OTHER): Payer: Self-pay | Admitting: Emergency Medicine

## 2017-01-21 ENCOUNTER — Emergency Department (HOSPITAL_BASED_OUTPATIENT_CLINIC_OR_DEPARTMENT_OTHER)
Admission: EM | Admit: 2017-01-21 | Discharge: 2017-01-21 | Disposition: A | Discharge status: Home / Self Care | Payer: Medicare Other | Attending: Emergency Medicine | Admitting: Emergency Medicine

## 2017-01-21 DIAGNOSIS — R569 Unspecified convulsions: Secondary | ICD-10-CM | POA: Diagnosis not present

## 2017-01-21 DIAGNOSIS — E1122 Type 2 diabetes mellitus with diabetic chronic kidney disease: Secondary | ICD-10-CM | POA: Diagnosis not present

## 2017-01-21 DIAGNOSIS — Z79899 Other long term (current) drug therapy: Secondary | ICD-10-CM | POA: Diagnosis not present

## 2017-01-21 DIAGNOSIS — Z7902 Long term (current) use of antithrombotics/antiplatelets: Secondary | ICD-10-CM | POA: Insufficient documentation

## 2017-01-21 DIAGNOSIS — N189 Chronic kidney disease, unspecified: Secondary | ICD-10-CM | POA: Diagnosis not present

## 2017-01-21 DIAGNOSIS — J449 Chronic obstructive pulmonary disease, unspecified: Secondary | ICD-10-CM | POA: Insufficient documentation

## 2017-01-21 DIAGNOSIS — I5030 Unspecified diastolic (congestive) heart failure: Secondary | ICD-10-CM | POA: Diagnosis not present

## 2017-01-21 DIAGNOSIS — I130 Hypertensive heart and chronic kidney disease with heart failure and stage 1 through stage 4 chronic kidney disease, or unspecified chronic kidney disease: Secondary | ICD-10-CM | POA: Diagnosis not present

## 2017-01-21 DIAGNOSIS — Z87891 Personal history of nicotine dependence: Secondary | ICD-10-CM | POA: Diagnosis not present

## 2017-01-21 LAB — CBC WITH DIFFERENTIAL/PLATELET
Basophils Absolute: 0 10*3/uL (ref 0.0–0.1)
Basophils Relative: 0 %
Eosinophils Absolute: 0.2 10*3/uL (ref 0.0–0.7)
Eosinophils Relative: 2 %
HCT: 40.5 % (ref 36.0–46.0)
Hemoglobin: 13.7 g/dL (ref 12.0–15.0)
Lymphocytes Relative: 41 %
Lymphs Abs: 3.9 10*3/uL (ref 0.7–4.0)
MCH: 29 pg (ref 26.0–34.0)
MCHC: 33.8 g/dL (ref 30.0–36.0)
MCV: 85.6 fL (ref 78.0–100.0)
Monocytes Absolute: 1.2 10*3/uL — ABNORMAL HIGH (ref 0.1–1.0)
Monocytes Relative: 13 %
Neutro Abs: 4.1 10*3/uL (ref 1.7–7.7)
Neutrophils Relative %: 44 %
Platelets: 242 10*3/uL (ref 150–400)
RBC: 4.73 MIL/uL (ref 3.87–5.11)
RDW: 15.2 % (ref 11.5–15.5)
WBC: 9.4 10*3/uL (ref 4.0–10.5)

## 2017-01-21 LAB — COMPREHENSIVE METABOLIC PANEL
ALT: 33 U/L (ref 14–54)
AST: 42 U/L — ABNORMAL HIGH (ref 15–41)
Albumin: 4.2 g/dL (ref 3.5–5.0)
Alkaline Phosphatase: 130 U/L — ABNORMAL HIGH (ref 38–126)
Anion gap: 9 (ref 5–15)
BUN: 13 mg/dL (ref 6–20)
CO2: 19 mmol/L — ABNORMAL LOW (ref 22–32)
Calcium: 10 mg/dL (ref 8.9–10.3)
Chloride: 112 mmol/L — ABNORMAL HIGH (ref 101–111)
Creatinine, Ser: 1.4 mg/dL — ABNORMAL HIGH (ref 0.44–1.00)
GFR calc Af Amer: 42 mL/min — ABNORMAL LOW (ref >60)
GFR calc non Af Amer: 36 mL/min — ABNORMAL LOW (ref >60)
Glucose, Bld: 143 mg/dL — ABNORMAL HIGH (ref 65–99)
Potassium: 3.5 mmol/L (ref 3.5–5.1)
Sodium: 140 mmol/L (ref 135–145)
Total Bilirubin: 0.4 mg/dL (ref 0.3–1.2)
Total Protein: 8.2 g/dL — ABNORMAL HIGH (ref 6.5–8.1)

## 2017-01-21 LAB — CBG MONITORING, ED: Glucose-Capillary: 145 mg/dL — ABNORMAL HIGH (ref 65–99)

## 2017-01-21 MED ORDER — SODIUM CHLORIDE 0.9 % IV BOLUS (SEPSIS)
500.0000 mL | Freq: Once | INTRAVENOUS | Status: AC
  Administered 2017-01-21: 500 mL via INTRAVENOUS

## 2017-01-21 MED ORDER — LORAZEPAM 2 MG/ML IJ SOLN
1.0000 mg | Freq: Once | INTRAMUSCULAR | Status: AC
  Administered 2017-01-21: 1 mg via INTRAVENOUS

## 2017-01-21 MED ORDER — LORAZEPAM 2 MG/ML IJ SOLN
INTRAMUSCULAR | Status: AC
Start: 2017-01-21 — End: 2017-01-21
  Administered 2017-01-21: 1 mg via INTRAVENOUS
  Filled 2017-01-21: qty 1

## 2017-01-21 NOTE — ED Notes (Signed)
Granddaughter states pt has not had her night meds usually given at 2000  Atorvastatin 40 mg Hydralazine 25 mg Vimpat 300 mg Keppra 1000 mg  EDP advised.

## 2017-01-21 NOTE — Discharge Instructions (Signed)
Follow-up with your neurologist.

## 2017-01-21 NOTE — ED Notes (Signed)
Sleepy/ sedated, NAD, calm, interactive, resps e/u, speaking clearly, short answers, no dyspnea noted, VSS. Family stepped away from [facility].

## 2017-01-21 NOTE — ED Notes (Signed)
ED Provider at bedside. 

## 2017-01-21 NOTE — ED Provider Notes (Signed)
[facility] EMERGENCY DEPARTMENT Provider Note   CSN: [id number] Arrival date & time: 01/21/17  1851     History   Chief Complaint Chief Complaint  Patient presents with  . Seizures    HPI Michelle Farrell is a 76 y.o. female.  HPI   I was called to room on patient arrival because she was actively seizing.  Generalized tonic/clonic activity.  Her granddaughter is at bedside and very good historian.  She reports that she has a seizure history.  This is consistent with prior seizure activity.  Onset just before arrival while at a grandchild's baseball game.  She is followed by neurology.  She is on Keppra and Vimpat.  She is compliant as far as her granddaughter is aware.  She seemed to be in her usual state of health just prior.  Past Medical History:  Diagnosis Date  . AKI (acute kidney injury) ([place])   . Arthritis   . Back pain, chronic   . CHF (congestive heart failure) ([place])   . CKD (chronic kidney disease)   . COPD (chronic obstructive pulmonary disease) ([place])   . Crohn disease ([place])   . Diabetes mellitus without complication ([place])   . Esophageal stricture    s/p dilation 2003, 2008  . GERD (gastroesophageal reflux disease)   . Hip pain, chronic   . Hypertension   . PUD (peptic ulcer disease)   . Seizures ([place])   . Sleep apnea   . Stroke [facility])     Patient Active Problem List   Diagnosis Date Noted  . Lacunar infarction 10/13/2014  . Partial seizure disorder ([place]) 10/13/2014  . Excessive sleepiness 10/13/2014  . Seizure disorder as sequela of cerebrovascular accident ([place]) 09/14/2014  . Cerebral embolism with cerebral infarction ([place]) 09/13/2014  . Sinus bradycardia 09/12/2014  . Dyslipidemia 09/12/2014  . Convulsion ([place])   . Anemia in chronic kidney disease 07/13/2014  . Abnormal CT scan, stomach 07/08/2014  . Duodenitis with hemorrhage 07/08/2014  . Gastritis and gastroduodenitis 07/08/2014  . Esophagitis determined by endoscopy 07/08/2014  .  Hypokalemia 07/04/2014  . Anemia 07/04/2014  . 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 Sepsis secondary to UTI ([place])   . Acute encephalopathy 06/19/2014  . Tobacco abuse 06/19/2014  . Crohn's disease ([place]) 06/12/2014  . Pelvic mass 05/16/2014  . Hypertension 05/16/2014  . Diastolic CHF ([place]) 83/41/9622  . Back pain, chronic 05/16/2014  . OBSTRUCTIVE SLEEP APNEA 11/14/2008  . Diabetes mellitus ([place]) 06/28/2007  . NEUROPATHY 06/28/2007  . ESOPHAGEAL STRICTURE 06/28/2007  . HIATAL HERNIA 06/28/2007  . POLYARTHRITIS 06/28/2007  . PUD, HX OF 06/28/2007    Past Surgical History:  Procedure Laterality Date  . ABDOMINAL HYSTERECTOMY    . BLADDER SURGERY    . CHOLECYSTECTOMY    . ESOPHAGOGASTRODUODENOSCOPY N/A 07/08/2014   Procedure: ESOPHAGOGASTRODUODENOSCOPY (EGD);  Surgeon: Ladene Artist, MD;  Location: [facility] ENDOSCOPY;  Service: Endoscopy;  Laterality: N/A;  . TONSILLECTOMY      OB History    No data available       Home Medications    Prior to Admission medications   Medication Sig Start Date End Date Taking? Authorizing Provider  atorvastatin (LIPITOR) 40 MG tablet TAKE 1 TABLET(40 MG) BY MOUTH EVERY NIGHT 08/16/16   [provider]  clopidogrel (PLAVIX) 75 MG tablet Take 1 tablet (75 mg total) by mouth daily. 09/14/14   Patrecia Pour, MD  hydrALAZINE (APRESOLINE) 25 MG tablet Take 25 mg by mouth. 03/16/15  [provider]  isosorbide mononitrate (IMDUR) 30 MG 24 hr tablet TK 1 T PO QD 04/14/15   [provider]  Lacosamide (VIMPAT) 150 MG TABS Take 2 tablets (300 mg total) by mouth at bedtime. 10/07/16   Kathrynn Ducking, MD  lacosamide (VIMPAT) 200 MG TABS tablet Take 1 tablet (200 mg total) by mouth daily. 10/07/16   Kathrynn Ducking, MD  levETIRAcetam (KEPPRA) 1000 MG tablet TAKE 1 TABLET BY MOUTH TWICE DAILY 11/07/16   Marcial Pacas, MD  levETIRAcetam (KEPPRA) 1000 MG tablet Take 523m in the and 10069mat night 11/07/16   SeGarvin Fila MD    Family History Family History  Problem Relation Age of Onset  . Ovarian cancer Maternal Grandmother   . Stroke Mother   . Stroke Brother     Social History Social History  Substance Use Topics  . Smoking status: Former Smoker    Packs/day: 1.00    Years: 30.00    Types: Cigarettes    Quit date: 05/27/2014  . Smokeless tobacco: Never Used  . Alcohol use No     Allergies   Tramadol   Review of Systems Review of Systems  Level 5 caveat because pt unable to respond to questioning.    Physical Exam Updated Vital Signs BP (!) 154/80   Pulse 71   Resp (!) 30   SpO2 98%   Physical Exam  Constitutional: She appears well-developed and well-nourished. She appears distressed.  HENT:  Head: Normocephalic and atraumatic.  Eyes: Conjunctivae are normal. Right eye exhibits no discharge. Left eye exhibits no discharge.  Neck: Neck supple.  Cardiovascular: Normal rate, regular rhythm and normal heart sounds. Exam reveals no gallop and no friction rub.  No murmur heard. Pulmonary/Chest: Breath sounds normal. No respiratory distress.  tahcypnea  Abdominal: Soft. She exhibits no distension. There is no tenderness.  Musculoskeletal: She exhibits no edema or tenderness.  Neurological:  Actively seizing. Generalized tonic/clonic activity. R sided gaze.   Skin: Skin is warm and dry.  Nursing note and vitals reviewed.    ED Treatments / Results  Labs (all labs ordered are listed, but only abnormal results are displayed) Labs Reviewed  CBC WITH DIFFERENTIAL/PLATELET - Abnormal; Notable for the following:       Result Value   Monocytes Absolute 1.2 (*)    All other components within normal limits  COMPREHENSIVE METABOLIC PANEL - Abnormal; Notable for the following:    Chloride 112 (*)    CO2 19 (*)    Glucose, Bld 143 (*)    Creatinine, Ser 1.40 (*)    Total Protein 8.2 (*)    AST 42 (*)    Alkaline Phosphatase 130 (*)    GFR calc non Af Amer 36 (*)    GFR calc Af  Amer 42 (*)    All other components within normal limits  CBG MONITORING, ED - Abnormal; Notable for the following:    Glucose-Capillary 145 (*)    All other components within normal limits    EKG  EKG Interpretation  Date/Time:  Saturday January 21 2017 19:02:36 EDT Ventricular Rate:  90 PR Interval:    QRS Duration: 148 QT Interval:  392 QTC Calculation: 480 R Axis:   -65 Text Interpretation:  Sinus rhythm Probable left atrial enlargement Left bundle branch block Confirmed by KoVirgel [phone] 01/21/2017 10:23:00 PM       Radiology No results found.  Procedures Procedures (including critical care time)  Medications Ordered in ED Medications  LORazepam (ATIVAN) injection 1 mg (1 mg Intravenous Given 01/21/17 1900)  LORazepam (ATIVAN) injection 1 mg (1 mg Intravenous Given 01/21/17 1905)  sodium chloride 0.9 % bolus 500 mL (0 mLs Intravenous Stopped 01/21/17 2103)     Initial Impression / Assessment and Plan / ED Course  I have reviewed the triage vital signs and the nursing notes.  Pertinent labs & imaging results that were available during my care of the patient were reviewed by me and considered in my medical decision making (see chart for details).     76 year old female with seizures.  History of the same.  Stop with Ativan.  She is observed with continued improvement of her mental status.  Still some mild drowsiness but is answering questions appropriately and following commands.  Granddaughter is comfortable with discharge at this time.  Renal function noted.  Granddaughter states that this is actually not acute.  Final Clinical Impressions(s) / ED Diagnoses   Final diagnoses:  Seizure [facility])    New Prescriptions New Prescriptions   No medications on file     Virgel Manifold, MD 01/31/17 1615

## 2017-01-21 NOTE — ED Triage Notes (Signed)
Patient is brought in via [place] by daughter. Patient started to have seizure at [place] and has been seizing since 1830 - the patient has contractures to her left and right hand. Patient has gaze off to the right and tremors to her jaw. The patient is non responsive to stimulus, eyes are awake. The patient is flaccid to all limps at this time

## 2017-01-21 NOTE — ED Notes (Signed)
Pt resting on monitor, no changes, VSS, family contacted by phone to return for d/c, agreeable, will return presently.

## 2017-01-21 NOTE — ED Notes (Signed)
Pt placed on NRB @ 15lpm.

## 2017-04-06 NOTE — Progress Notes (Deleted)
[facility]  PATIENT: Michelle Farrell DOB: September 26, 1940   REASON FOR VISIT: Follow-up for seizure disorder history of stroke HISTORY FROM:    HISTORY OF PRESENT ILLNESS:HPI: PS74 year frail elderly African-American lady who is had 2 recent admissions to [facility] in March and June 2016. Patient is unable to provide history which is obtained from her granddaughter as well as review of electronic medical records. In March 2016 she was found unresponsive on the floor at home by her granddaughter. She she was admitted to [facility] where she was found to have evidence of urinary tract infection as well as sepsis. MRI scan of the brain showed a small left thalamic infarct which was felt not to be able to explain her neurological exam. She had a prolonged recovery and went to inpatient rehabilitation as well as [place] home and was quite weak and tired but able to ambulate. She was home barely few weeks while she was readmitted in June with again altered mental status. The granddaughter feels this may have been brought on by increasing dose of tramadol that she was taking for pain. She had a witnessed episode of seizure at that time which as per granddaughter's eyewitness account showed tonic movement in the right arm followed by clonic activity as well as head jerking and eyeballs rolling up lasting few minutes. She was started on Keppra 500 twice a day following this. EEG however was abnormal even in the first admission in March and Dr. Nicole Kindred read an EEG on 06/20/2014 showing periodic left sharp waves. Patient has continued to feel weak and lose weight overall. The last couple of weeks appetite has improved. 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 There is a tiny diffusion positive lesion in the left middle cerebral peduncle which is artifact  versus tiny infarct. Patient's granddaughter feels that she is quite sleepy during the day and this may be related to [place]. She is walking with a cane and has had no major falls. She's had no recurrent seizure episodes either. She is on Plavix for second stroke prevention she is tolerating well without bruising or bleeding and remains on Lipitor. Update 12/23/2014 PS: She returns for follow-up after initial consultation 2 months ago. She is accompanied by daughter. Patient has noticed increase in daytime alertness and more perspiration in activities since reducing the dose of Keppra from 500-to 250 in the morning. She is tolerating Keppra 500 mg at night which helps her sleep. She had outpatient EEG done on 10/24/14 which I personally reviewed and showed mild left greater than right hemispheric slowing but without definite seizure activity. Patient complains of occasional dizzy episodes and staggering but this is infrequent. The daughter feels this may be related to low blood pressure . She is living at home. She is admits she is not eating well. She has no new complaints today. She remains on Plavix which is tolerating well without bleeding.   UPDATE 01/13/2016 Michelle Farrell, 76 year old female returns for follow-up with her caregiver. She has a history of seizure disorder with one seizure earlier in the week due to lack of sleep. She is currently on Keppra 500 twice daily and Vimpat 200 twice daily.EEG done on 10/24/14 which I personally reviewed and showed mild left greater than right hemispheric slowing but without definite seizure activity. She continues to live in her own home with a paid caregiver.  Granddaughter checks in frequently. She remains on Plavix for secondary stroke prevention. She has not had any bruising or bleeding. She returns for reevaluation Update 06/23/2016 : She returns for follow-up after last visit 6 months ago. She is a complete by granddaughter and caregiver. Patient congested will have  breakthrough seizures in December, February as well as 10 days ago. She was seen at [facility] emergency room and I have reviewed the records through care everywhere. Patient had basic lab work and urinalysis and chest x-ray which was unremarkable. She remains on heparin 500 twice daily as well as Vimpat 200 twice daily. The grand daughter herself dispenses her medications and feels she is been compliant. 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 She still not return back to her baseline following her last seizure 10 days ago. The patient requires 24-hour supervision at home and has caregivers but when she is well she can dress and feed herself and attend to her toilet needs. She needs some help with feeding. Patient did not do well on Mini-Mental status testing today and scored only 8/30. The patient's granddaughter appears frustrated with her recurrent seizures. and would like referral to a epilepsy specialist . Update 7/17/2018PS:She returns for follow-up after last visit 3 months ago. She is accompanied by her daughter. Patient had another breakthrough seizure in June and was seen at [facility] There was no obvious triggers or precipitating event for her seizure. I do not have the records to review today. She was asked to increase the dose of Keppra to 1 g twice daily. She remains on Vimpat 200 mg in the morning and 300 mg at night. She seems to be tolerating both medications very well. She took her 5-7 days to recover fully from her post pill cognitive sequelae that she usually gets. She has an appointment to see epileptologist Dr. Opal Sidles box at [facility] in September. Patient continues to live at home. She has 24-hour care at home. She is independent in most activities of daily living but needs some help with cooking and bathing. She has not been driving. She has no new complaints today. She states she's been compliant  with her medication and has not missed any doses.    REVIEW OF SYSTEMS: Full 14 system review of systems performed and notable only for those listed, all others are neg:  Constitutional: neg  Cardiovascular: neg Ear/Nose/Throat: neg  Skin: neg Eyes: neg Respiratory: neg Gastroitestinal: neg  Hematology/Lymphatic: neg  Endocrine: neg Musculoskeletal:neg Allergy/Immunology: neg Neurological: neg Psychiatric: neg Sleep : neg   ALLERGIES: Allergies  Allergen Reactions  . Tramadol     Seizures     HOME MEDICATIONS: Outpatient Medications Prior to Visit  Medication Sig Dispense Refill  . atorvastatin (LIPITOR) 40 MG tablet TAKE 1 TABLET(40 MG) BY MOUTH EVERY NIGHT    . clopidogrel (PLAVIX) 75 MG tablet Take 1 tablet (75 mg total) by mouth daily. 30 tablet 0  . hydrALAZINE (APRESOLINE) 25 MG tablet Take 25 mg by mouth.    . isosorbide mononitrate (IMDUR) 30 MG 24 hr tablet TK 1 T PO QD  0  . Lacosamide (VIMPAT) 150 MG TABS Take 2 tablets (300 mg total) by mouth at bedtime. 360 tablet 1  . lacosamide (VIMPAT) 200 MG TABS tablet Take 1 tablet (200 mg total) by mouth daily. 30 tablet 0  . levETIRAcetam (KEPPRA) 1000 MG tablet TAKE 1 TABLET BY MOUTH TWICE  DAILY 60 tablet 0  . levETIRAcetam (KEPPRA) 1000 MG tablet Take 552m in the and 10077mat night 60 tablet 11   Facility-Administered Medications Prior to Visit  Medication Dose Route Frequency Provider Last Rate Last Dose  . sodium chloride 0.9 % injection 10-40 mL  10-40 mL Intracatheter PRN StLadene ArtistMD   10 mL at 07/09/14 0501    PAST MEDICAL HISTORY: Past Medical History:  Diagnosis Date  . AKI (acute kidney injury) ([place]  . Arthritis   . Back pain, chronic   . CHF (congestive heart failure) ([place]  . CKD (chronic kidney disease)   . COPD (chronic obstructive pulmonary disease) ([place]  . Crohn disease ([place]  . Diabetes mellitus without complication ([place]CK[place]  . Esophageal stricture    s/p dilation 2003, 2008    . GERD (gastroesophageal reflux disease)   . Hip pain, chronic   . Hypertension   . PUD (peptic ulcer disease)   . Seizures ([place]  . Sleep apnea   . [facility]    PAST SURGICAL HISTORY: Past Surgical History:  Procedure Laterality Date  . ABDOMINAL HYSTERECTOMY    . BLADDER SURGERY    . CHOLECYSTECTOMY    . ESOPHAGOGASTRODUODENOSCOPY N/A 07/08/2014   Procedure: ESOPHAGOGASTRODUODENOSCOPY (EGD);  Surgeon: MaLadene ArtistMD;  [facility];  Service: Endoscopy;  Laterality: N/A;  . TONSILLECTOMY      FAMILY HISTORY: Family History  Problem Relation Age of Onset  . Ovarian cancer Maternal Grandmother   . Stroke Mother   . Stroke Brother     SOCIAL HISTORY: Social History   Socioeconomic History  . Marital status: Widowed    Spouse name: Not on file  . Number of children: Not on file  . Years of education: Not on file  . Highest education level: Not on file  Social Needs  . Financial resource strain: Not on file  . Food insecurity - worry: Not on file  . Food insecurity - inability: Not on file  . Transportation needs - medical: Not on file  . Transportation needs - non-medical: Not on file  Occupational History  . Not on file  Tobacco Use  . Smoking status: Former Smoker    Packs/day: 1.00    Years: 30.00    Pack years: 30.00    Types: Cigarettes    Last attempt to quit: 05/27/2014    Years since quitting: 2.8  . Smokeless tobacco: Never Used  Substance and Sexual Activity  . Alcohol use: No  . Drug use: No  . Sexual activity: No  Other Topics Concern  . Not on file  Social History Narrative   24 oz of caffeine a day    Lives alone at home occasionally with granddaughter     Widowed and disabled      PHYSICAL EXAM  There were no vitals filed for this visit. There is no height or weight on file to calculate BMI.  Generalized: Well developed, in no acute distress  Head: normocephalic and atraumatic,. Oropharynx benign  Neck: Supple,  no carotid bruits  Cardiac: Regular rate rhythm, no murmur  Musculoskeletal: No deformity   Neurological examination   Mentation: Alert oriented to time, place, history taking. Attention span and concentration appropriate. Recent and remote memory intact.  Follows all commands speech and language fluent.   Cranial nerve II-XII: Fundoscopic exam reveals sharp disc margins.Pupils were equal round reactive to light extraocular movements were full,  visual field were full on confrontational test. Facial sensation and strength were normal. hearing was intact to finger rubbing bilaterally. Uvula tongue midline. head turning and shoulder shrug were normal and symmetric.Tongue protrusion into cheek strength was normal. Motor: normal bulk and tone, full strength in the BUE, BLE, fine finger movements normal, no pronator drift. No focal weakness Sensory: normal and symmetric to light touch, pinprick, and  Vibration, proprioception  Coordination: finger-nose-finger, heel-to-shin bilaterally, no dysmetria Reflexes: Brachioradialis 2/2, biceps 2/2, triceps 2/2, patellar 2/2, Achilles 2/2, plantar responses were flexor bilaterally. Gait and Station: Rising up from seated position without assistance, normal stance,  moderate stride, good arm swing, smooth turning, able to perform tiptoe, and heel walking without difficulty. Tandem gait is steady  DIAGNOSTIC DATA (LABS, IMAGING, TESTING) - I reviewed patient records, labs, notes, testing and imaging myself where available.  Lab Results  Component Value Date   WBC 9.4 01/21/2017   HGB 13.7 01/21/2017   HCT 40.5 01/21/2017   MCV 85.6 01/21/2017   PLT 242 01/21/2017      Component Value Date/Time   NA 140 01/21/2017 1910   K 3.5 01/21/2017 1910   CL 112 (H) 01/21/2017 1910   CO2 19 (L) 01/21/2017 1910   GLUCOSE 143 (H) 01/21/2017 1910   BUN 13 01/21/2017 1910   CREATININE 1.40 (H) 01/21/2017 1910   CALCIUM 10.0 01/21/2017 1910   PROT 8.2 (H)  01/21/2017 1910   ALBUMIN 4.2 01/21/2017 1910   AST 42 (H) 01/21/2017 1910   ALT 33 01/21/2017 1910   ALKPHOS 130 (H) 01/21/2017 1910   BILITOT 0.4 01/21/2017 1910   GFRNONAA 36 (L) 01/21/2017 1910   GFRAA 42 (L) 01/21/2017 1910   Lab Results  Component Value Date   CHOL 213 (H) 09/14/2014   HDL 35 (L) 09/14/2014   LDLCALC 138 (H) 09/14/2014   TRIG 200 (H) 09/14/2014   CHOLHDL 6.1 09/14/2014   Lab Results  Component Value Date   HGBA1C 5.3 06/20/2014   Lab Results  Component Value Date   VITAMINB12 229 07/06/2014   Lab Results  Component Value Date   TSH 0.491 07/05/2014     ASSESSMENT AND PLAN  40 year African-American lady with episodes of altered mental status with partial onset seizures in March and June 2016 in the setting of urinary tract infection with 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 Vascular risk factors of hyperlipidemia, hypertension, CHF. She is having recurrent breakthrough seizures despite being on Keppra and Vimpat   PLAN:  I had a long discussion the patient and her daughter regarding her breakthrough seizures which continue on current medication regime. Agree with increasing Keppra dose to 1 g twice daily and continue Vimpat 200 mg the morning and 300 mg at night. Avoid seizure triggers like sleep deprivation, medication noncompliance. Patient was advised to keep appointment with epileptologist Dr. Joesph July at [facility] in September for second opinion. Greater than 50% time during this 25 minute visit was spent on counseling and coordination of care about her breakthrough seizures and answering questions She will return for follow-up in 6 months with treatment as practitioner call earlier if necessary  Dennie Bible, [facility], [facility], [facility] [address], [place],  [address] [phone]

## 2017-04-07 ENCOUNTER — Encounter: Payer: Self-pay | Admitting: *Deleted

## 2017-04-07 ENCOUNTER — Telehealth: Payer: Self-pay | Admitting: *Deleted

## 2017-04-07 ENCOUNTER — Ambulatory Visit: Payer: Medicare Other | Admitting: Nurse Practitioner

## 2017-04-07 NOTE — Telephone Encounter (Signed)
Patient was no show for follow up with NP today.  

## 2017-04-10 ENCOUNTER — Encounter: Payer: Self-pay | Admitting: Nurse Practitioner

## 2017-04-19 ENCOUNTER — Other Ambulatory Visit: Payer: Self-pay | Admitting: Neurology

## 2017-04-21 ENCOUNTER — Other Ambulatory Visit: Payer: Self-pay | Admitting: Neurology

## 2017-04-21 NOTE — Telephone Encounter (Signed)
Rx faxed to [facility]

## 2019-03-23 ENCOUNTER — Emergency Department (HOSPITAL_BASED_OUTPATIENT_CLINIC_OR_DEPARTMENT_OTHER): Payer: Medicare Other

## 2019-03-23 ENCOUNTER — Emergency Department (HOSPITAL_BASED_OUTPATIENT_CLINIC_OR_DEPARTMENT_OTHER)
Admission: EM | Admit: 2019-03-23 | Discharge: 2019-03-23 | Disposition: A | Discharge status: Home / Self Care | Payer: Medicare Other | Attending: Emergency Medicine | Admitting: Emergency Medicine

## 2019-03-23 ENCOUNTER — Encounter (HOSPITAL_BASED_OUTPATIENT_CLINIC_OR_DEPARTMENT_OTHER): Payer: Self-pay | Admitting: *Deleted

## 2019-03-23 ENCOUNTER — Other Ambulatory Visit: Payer: Self-pay

## 2019-03-23 DIAGNOSIS — Z87891 Personal history of nicotine dependence: Secondary | ICD-10-CM | POA: Insufficient documentation

## 2019-03-23 DIAGNOSIS — N189 Chronic kidney disease, unspecified: Secondary | ICD-10-CM | POA: Diagnosis not present

## 2019-03-23 DIAGNOSIS — Z8673 Personal history of transient ischemic attack (TIA), and cerebral infarction without residual deficits: Secondary | ICD-10-CM | POA: Insufficient documentation

## 2019-03-23 DIAGNOSIS — R531 Weakness: Secondary | ICD-10-CM

## 2019-03-23 DIAGNOSIS — Z7902 Long term (current) use of antithrombotics/antiplatelets: Secondary | ICD-10-CM | POA: Diagnosis not present

## 2019-03-23 DIAGNOSIS — E1122 Type 2 diabetes mellitus with diabetic chronic kidney disease: Secondary | ICD-10-CM | POA: Insufficient documentation

## 2019-03-23 DIAGNOSIS — I130 Hypertensive heart and chronic kidney disease with heart failure and stage 1 through stage 4 chronic kidney disease, or unspecified chronic kidney disease: Secondary | ICD-10-CM | POA: Diagnosis not present

## 2019-03-23 DIAGNOSIS — Z79899 Other long term (current) drug therapy: Secondary | ICD-10-CM | POA: Insufficient documentation

## 2019-03-23 DIAGNOSIS — U071 COVID-19: Secondary | ICD-10-CM

## 2019-03-23 DIAGNOSIS — I5032 Chronic diastolic (congestive) heart failure: Secondary | ICD-10-CM | POA: Insufficient documentation

## 2019-03-23 DIAGNOSIS — J449 Chronic obstructive pulmonary disease, unspecified: Secondary | ICD-10-CM | POA: Diagnosis not present

## 2019-03-23 LAB — SARS CORONAVIRUS 2 AG (30 MIN TAT): SARS Coronavirus 2 Ag: POSITIVE — AB

## 2019-03-23 LAB — CBC WITH DIFFERENTIAL/PLATELET
Abs Immature Granulocytes: 0.04 10*3/uL (ref 0.00–0.07)
Basophils Absolute: 0 10*3/uL (ref 0.0–0.1)
Basophils Relative: 0 %
Eosinophils Absolute: 0 10*3/uL (ref 0.0–0.5)
Eosinophils Relative: 0 %
HCT: 42.6 % (ref 36.0–46.0)
Hemoglobin: 14.2 g/dL (ref 12.0–15.0)
Immature Granulocytes: 0 %
Lymphocytes Relative: 6 %
Lymphs Abs: 0.6 10*3/uL — ABNORMAL LOW (ref 0.7–4.0)
MCH: 29.9 pg (ref 26.0–34.0)
MCHC: 33.3 g/dL (ref 30.0–36.0)
MCV: 89.7 fL (ref 80.0–100.0)
Monocytes Absolute: 0.5 10*3/uL (ref 0.1–1.0)
Monocytes Relative: 5 %
Neutro Abs: 8.3 10*3/uL — ABNORMAL HIGH (ref 1.7–7.7)
Neutrophils Relative %: 89 %
Platelets: 128 10*3/uL — ABNORMAL LOW (ref 150–400)
RBC: 4.75 MIL/uL (ref 3.87–5.11)
RDW: 14.6 % (ref 11.5–15.5)
WBC: 9.4 10*3/uL (ref 4.0–10.5)
nRBC: 0 % (ref 0.0–0.2)

## 2019-03-23 LAB — URINALYSIS, ROUTINE W REFLEX MICROSCOPIC
Bilirubin Urine: NEGATIVE
Glucose, UA: NEGATIVE mg/dL
Hgb urine dipstick: NEGATIVE
Ketones, ur: NEGATIVE mg/dL
Leukocytes,Ua: NEGATIVE
Nitrite: NEGATIVE
Protein, ur: 30 mg/dL — AB
Specific Gravity, Urine: 1.02 (ref 1.005–1.030)
pH: 6 (ref 5.0–8.0)

## 2019-03-23 LAB — COMPREHENSIVE METABOLIC PANEL
ALT: 23 U/L (ref 0–44)
AST: 41 U/L (ref 15–41)
Albumin: 3.5 g/dL (ref 3.5–5.0)
Alkaline Phosphatase: 78 U/L (ref 38–126)
Anion gap: 9 (ref 5–15)
BUN: 21 mg/dL (ref 8–23)
CO2: 21 mmol/L — ABNORMAL LOW (ref 22–32)
Calcium: 9.6 mg/dL (ref 8.9–10.3)
Chloride: 109 mmol/L (ref 98–111)
Creatinine, Ser: 1.42 mg/dL — ABNORMAL HIGH (ref 0.44–1.00)
GFR calc Af Amer: 41 mL/min — ABNORMAL LOW (ref >60)
GFR calc non Af Amer: 36 mL/min — ABNORMAL LOW (ref >60)
Glucose, Bld: 137 mg/dL — ABNORMAL HIGH (ref 70–99)
Potassium: 4 mmol/L (ref 3.5–5.1)
Sodium: 139 mmol/L (ref 135–145)
Total Bilirubin: 0.3 mg/dL (ref 0.3–1.2)
Total Protein: 7.5 g/dL (ref 6.5–8.1)

## 2019-03-23 LAB — URINALYSIS, MICROSCOPIC (REFLEX)

## 2019-03-23 LAB — LACTIC ACID, PLASMA: Lactic Acid, Venous: 2.2 mmol/L (ref 0.5–1.9)

## 2019-03-23 MED ORDER — SODIUM CHLORIDE 0.9 % IV BOLUS
1000.0000 mL | Freq: Once | INTRAVENOUS | Status: AC
  Administered 2019-03-23: 17:00:00 1000 mL via INTRAVENOUS

## 2019-03-23 NOTE — Discharge Instructions (Signed)
PLEASE RETURN TO ER IF ANY BREATHING PROBLEMS, CONFUSION, LETHARGY, OXYGEN LEVEL <91%, OR CONCERNS FOR DEHYDRATION.

## 2019-03-23 NOTE — ED Notes (Signed)
Pt and granddaughter verbalized understanding d/c instructions

## 2019-03-23 NOTE — ED Triage Notes (Signed)
Pt's granddaughter is with pt, she is one of her caregivers. Reports pt has been weak over the last few days and has had an odor to her urine. Reports pt had runny diarrhea today. Pt had a stroke in 2016 and has had seizures since then.

## 2019-03-23 NOTE — ED Provider Notes (Signed)
[facility] EMERGENCY DEPARTMENT Provider Note   CSN: [id number] Arrival date & time: 03/23/19  1502     History Chief Complaint  Patient presents with  . Weakness    Michelle Farrell is a 78 y.o. female.  78 year old female with extensive past medical history below including CKD, CHF, COPD, seizures, stroke, sleep apnea who presents with weakness.  Patient reports approximately 4 days of progressively worsening generalized weakness and fatigue.  Occasional shortness of breath but no persistent shortness of breath.  Occasional cough.  She has had some diarrhea today and had a single episode of vomiting yesterday.  Family member reports that her urine smelled strong a few days ago but since then she has been pushing fluids and it seems to be improving.  Patient denies any abdominal pain.  No fevers.  No sick contacts.  The history is provided by the patient and a relative.  Weakness      Past Medical History:  Diagnosis Date  . AKI (acute kidney injury) ([place])   . Arthritis   . Back pain, chronic   . CHF (congestive heart failure) ([place])   . CKD (chronic kidney disease)   . COPD (chronic obstructive pulmonary disease) ([place])   . Crohn disease ([place])   . Diabetes mellitus without complication ([place])   . Esophageal stricture    s/p dilation 2003, 2008  . GERD (gastroesophageal reflux disease)   . Hip pain, chronic   . Hypertension   . PUD (peptic ulcer disease)   . Seizures ([place])   . Sleep apnea   . Stroke [facility])     Patient Active Problem List   Diagnosis Date Noted  . 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 Excessive sleepiness 10/13/2014  . Seizure disorder as sequela of cerebrovascular accident ([place]) 09/14/2014  . Cerebral embolism with cerebral infarction ([place]) 09/13/2014  . Sinus bradycardia 09/12/2014  . Dyslipidemia 09/12/2014  . Convulsion ([place])   . Anemia in chronic kidney disease 07/13/2014  . Abnormal CT  scan, stomach 07/08/2014  . Duodenitis with hemorrhage 07/08/2014  . Gastritis and gastroduodenitis 07/08/2014  . Esophagitis determined by endoscopy 07/08/2014  . Hypokalemia 07/04/2014  . Anemia 07/04/2014  . 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 Sepsis secondary to UTI ([place])   . Acute encephalopathy 06/19/2014  . Tobacco abuse 06/19/2014  . Crohn's disease ([place]) 06/12/2014  . Pelvic mass 05/16/2014  . Hypertension 05/16/2014  . Diastolic CHF ([place]) 88/41/6606  . Back pain, chronic 05/16/2014  . OBSTRUCTIVE SLEEP APNEA 11/14/2008  . Diabetes mellitus ([place]) 06/28/2007  . NEUROPATHY 06/28/2007  . ESOPHAGEAL STRICTURE 06/28/2007  . HIATAL HERNIA 06/28/2007  . POLYARTHRITIS 06/28/2007  . PUD, HX OF 06/28/2007    Past Surgical History:  Procedure Laterality Date  . ABDOMINAL HYSTERECTOMY    . BLADDER SURGERY    . CHOLECYSTECTOMY    . ESOPHAGOGASTRODUODENOSCOPY N/A 07/08/2014   Procedure: ESOPHAGOGASTRODUODENOSCOPY (EGD);  Surgeon: Ladene Artist, MD;  Location: [facility] ENDOSCOPY;  Service: Endoscopy;  Laterality: N/A;  . SKIN CANCER EXCISION    . TONSILLECTOMY       OB History   No obstetric history on file.     Family History  Problem Relation Age of Onset  . Ovarian cancer Maternal Grandmother   . Stroke Mother   . Stroke Brother     Social History   Tobacco Use  . Smoking status: Former Smoker    Packs/day: 1.00  Years: 30.00    Pack years: 30.00    Types: Cigarettes    Quit date: 05/27/2014    Years since quitting: 4.8  . Smokeless tobacco: Never Used  Substance Use Topics  . Alcohol use: No  . Drug use: No    Home Medications Prior to Admission medications   Medication Sig Start Date End Date Taking? Authorizing Provider  atorvastatin (LIPITOR) 40 MG tablet TAKE 1 TABLET(40 MG) BY MOUTH EVERY NIGHT 08/16/16   [provider]  clopidogrel (PLAVIX) 75 MG tablet Take 1 tablet (75 mg total) by mouth daily. 09/14/14   Patrecia Pour, MD    hydrALAZINE (APRESOLINE) 25 MG tablet Take 25 mg by mouth. 03/16/15   [provider]  isosorbide mononitrate (IMDUR) 30 MG 24 hr tablet TK 1 T PO QD 04/14/15   [provider]  lacosamide (VIMPAT) 200 MG TABS tablet Take 1 tablet (200 mg total) by mouth daily. 10/07/16   Kathrynn Ducking, MD  levETIRAcetam (KEPPRA) 1000 MG tablet TAKE 1 TABLET BY MOUTH TWICE DAILY 11/07/16   Marcial Pacas, MD  levETIRAcetam (KEPPRA) 1000 MG tablet Take 531m in the and 10067mat night 11/07/16   SeGarvin FilaMD  VIMPAT 150 MG TABS TAKE 2 TABLETS BY MOUTH TWICE DAILY 04/21/17   WiKathrynn DuckingMD    Allergies    Tramadol  Review of Systems   Review of Systems  Neurological: Positive for weakness.  All other systems reviewed and are negative except that which was mentioned in HPI   Physical Exam Updated Vital Signs BP (!) 150/53   Pulse (!) 43   Temp 98.7 F (37.1 C) (Oral)   Resp 18   Ht 5' 7"  (1.702 m)   Wt 56.7 kg   SpO2 100%   BMI 19.58 kg/m   Physical Exam Vitals and nursing note reviewed.  Constitutional:      General: She is not in acute distress.    Appearance: She is well-developed.  HENT:     Head: Normocephalic and atraumatic.  Eyes:     Conjunctiva/sclera: Conjunctivae normal.  Cardiovascular:     Rate and Rhythm: Normal rate and regular rhythm.     Heart sounds: Normal heart sounds. No murmur.  Pulmonary:     Effort: Pulmonary effort is normal.     Breath sounds: Normal breath sounds.  Abdominal:     General: Bowel sounds are normal. There is no distension.     Palpations: Abdomen is soft.     Tenderness: There is no abdominal tenderness.  Musculoskeletal:     Cervical back: Neck supple.  Skin:    General: Skin is warm and dry.  Neurological:     Mental Status: She is alert and oriented to person, place, and time.     Comments: Fluent speech  Psychiatric:        Judgment: Judgment normal.     ED Results / Procedures / Treatments    Labs (all labs ordered are listed, but only abnormal results are displayed) Labs Reviewed  SARS CORONAVIRUS 2 AG (30 MIN TAT) - Abnormal; Notable for the following components:      Result Value   SARS Coronavirus 2 Ag POSITIVE (*)    All other components within normal limits  URINALYSIS, ROUTINE W REFLEX MICROSCOPIC - Abnormal; Notable for the following components:   Protein, ur 30 (*)    All other components within normal limits  COMPREHENSIVE METABOLIC PANEL - Abnormal; Notable  for the following components:   CO2 21 (*)    Glucose, Bld 137 (*)    Creatinine, Ser 1.42 (*)    GFR calc non Af Amer 36 (*)    GFR calc Af Amer 41 (*)    All other components within normal limits  CBC WITH DIFFERENTIAL/PLATELET - Abnormal; Notable for the following components:   Platelets 128 (*)    Neutro Abs 8.3 (*)    Lymphs Abs 0.6 (*)    All other components within normal limits  LACTIC ACID, PLASMA - Abnormal; Notable for the following components:   Lactic Acid, Venous 2.2 (*)    All other components within normal limits  URINALYSIS, MICROSCOPIC (REFLEX) - Abnormal; Notable for the following components:   Bacteria, UA MANY (*)    All other components within normal limits  URINE CULTURE    EKG EKG Interpretation  Date/Time:  Saturday March 23 2019 16:40:23 EST Ventricular Rate:  54 PR Interval:    QRS Duration: 103 QT Interval:  418 QTC Calculation: 397 R Axis:   -51 Text Interpretation: Sinus rhythm LAD, consider left anterior fascicular block Abnormal R-wave progression, late transition Borderline repolarization abnormality since previous tracing, LBBB improved and no ischemic changes Confirmed by Theotis Burrow [phone]) on 03/23/2019 4:58:47 PM   Radiology DG Chest Port 1 View  Result Date: 03/23/2019 CLINICAL DATA:  78 year old female with generalized weakness. EXAM: PORTABLE CHEST 1 VIEW COMPARISON:  Chest radiograph dated 09/11/2016. FINDINGS: The lungs are clear. There is no  pleural effusion or pneumothorax. The cardiac silhouette is within normal limits. Atherosclerotic calcification of the aorta. Old right rib fracture. No acute osseous pathology. IMPRESSION: No active disease. Electronically Signed   By: Anner Crete M.D.   On: 03/23/2019 16:36    Procedures Procedures (including critical care time)  Medications Ordered in ED Medications  sodium chloride 0.9 % bolus 1,000 mL (0 mLs Intravenous Stopped 03/23/19 1815)    ED Course  I have reviewed the triage vital signs and the nursing notes.  Pertinent labs & imaging results that were available during my care of the patient were reviewed by me and considered in my medical decision making (see chart for details).    MDM Rules/Calculators/A&P                      Comfortable on exam, normal vital signs.  Breathing comfortably.  Chest x-ray normal.  COVID-19 antigen test is positive.  Lactate barely elevated at 2.2, gave small fluid bolus.  CBC notable only for platelets of 128000 which may be a reflection of the viral process.  Creatinine is stable from previous. UA normal.  Pt appears to be compensating well for the viral infection and has no evidence of respiratory compromise or significant dehydration.  I have a had a long discussion with the patient and family member regarding close follow-up with PCP and close monitoring of breathing status. Counseled for 10 minutes on need for quarantine per CDC guidelines as well as general COVID-19 instructions. Have encouraged to return if any of her symptoms get worse.  They voiced understanding.    Michelle Farrell was evaluated in Emergency Department on 03/24/2019 for the symptoms described in the history of present illness. She was evaluated in the context of the global COVID-19 pandemic, which necessitated consideration that the patient might be at risk for infection with the SARS-CoV-2 virus that causes COVID-19. Institutional protocols and algorithms that  pertain to the evaluation of  patients at risk for COVID-19 are in a state of rapid change based on information released by regulatory bodies including the CDC and federal and state organizations. These policies and algorithms were followed during the patient's care in the ED.  Final Clinical Impression(s) / ED Diagnoses Final diagnoses:  COVID-19 virus infection  Generalized weakness    Rx / DC Orders ED Discharge Orders    None       Terisa Belardo, Wenda Overland, MD 03/24/19 0004

## 2019-03-23 NOTE — ED Notes (Signed)
Date and time results received: 03/23/19 1648 )  Test:lactic acid Critical Value: 2.2  Name of Provider Notified: Little  Orders Received? Or Actions Taken?: no orders given

## 2019-03-24 ENCOUNTER — Telehealth: Payer: Self-pay | Admitting: Unknown Physician Specialty

## 2019-03-24 NOTE — Telephone Encounter (Signed)
Called to discuss with patient about Covid symptoms and the use of bamlanivimab, a monoclonal antibody infusion for those with mild to moderate Covid symptoms and at a high risk of hospitalization.  Pt is qualified for this infusion at the [facility] infusion center due to older than 65 with co-morbid conditions.     Message left to call back

## 2019-03-25 LAB — URINE CULTURE: Culture: <10000 — AB

## 2019-05-24 ENCOUNTER — Ambulatory Visit: Payer: Medicare Other

## 2019-05-24 ENCOUNTER — Ambulatory Visit: Payer: Medicare Other | Attending: Internal Medicine

## 2019-05-24 DIAGNOSIS — Z23 Encounter for immunization: Secondary | ICD-10-CM

## 2019-06-18 ENCOUNTER — Emergency Department (HOSPITAL_BASED_OUTPATIENT_CLINIC_OR_DEPARTMENT_OTHER): Payer: Medicare Other

## 2019-06-18 ENCOUNTER — Encounter (HOSPITAL_BASED_OUTPATIENT_CLINIC_OR_DEPARTMENT_OTHER): Payer: Self-pay | Admitting: Emergency Medicine

## 2019-06-18 ENCOUNTER — Inpatient Hospital Stay (HOSPITAL_BASED_OUTPATIENT_CLINIC_OR_DEPARTMENT_OTHER)
Admission: EM | Admit: 2019-06-18 | Discharge: 2019-06-21 | DRG: 101 | Disposition: A | Discharge status: Home Health | Payer: Medicare Other | Attending: Internal Medicine | Admitting: Internal Medicine

## 2019-06-18 ENCOUNTER — Other Ambulatory Visit: Payer: Self-pay

## 2019-06-18 DIAGNOSIS — E785 Hyperlipidemia, unspecified: Secondary | ICD-10-CM | POA: Diagnosis not present

## 2019-06-18 DIAGNOSIS — G9340 Encephalopathy, unspecified: Secondary | ICD-10-CM | POA: Diagnosis present

## 2019-06-18 DIAGNOSIS — Z885 Allergy status to narcotic agent status: Secondary | ICD-10-CM

## 2019-06-18 DIAGNOSIS — J449 Chronic obstructive pulmonary disease, unspecified: Secondary | ICD-10-CM | POA: Diagnosis present

## 2019-06-18 DIAGNOSIS — Z20822 Contact with and (suspected) exposure to covid-19: Secondary | ICD-10-CM | POA: Diagnosis present

## 2019-06-18 DIAGNOSIS — I5032 Chronic diastolic (congestive) heart failure: Secondary | ICD-10-CM | POA: Diagnosis present

## 2019-06-18 DIAGNOSIS — R569 Unspecified convulsions: Secondary | ICD-10-CM

## 2019-06-18 DIAGNOSIS — Z8673 Personal history of transient ischemic attack (TIA), and cerebral infarction without residual deficits: Secondary | ICD-10-CM

## 2019-06-18 DIAGNOSIS — Z91048 Other nonmedicinal substance allergy status: Secondary | ICD-10-CM

## 2019-06-18 DIAGNOSIS — Z87891 Personal history of nicotine dependence: Secondary | ICD-10-CM

## 2019-06-18 DIAGNOSIS — D649 Anemia, unspecified: Secondary | ICD-10-CM | POA: Diagnosis present

## 2019-06-18 DIAGNOSIS — N1831 Chronic kidney disease, stage 3a: Secondary | ICD-10-CM | POA: Diagnosis present

## 2019-06-18 DIAGNOSIS — G4733 Obstructive sleep apnea (adult) (pediatric): Secondary | ICD-10-CM | POA: Diagnosis present

## 2019-06-18 DIAGNOSIS — Z720 Tobacco use: Secondary | ICD-10-CM | POA: Diagnosis present

## 2019-06-18 DIAGNOSIS — N189 Chronic kidney disease, unspecified: Secondary | ICD-10-CM

## 2019-06-18 DIAGNOSIS — I5030 Unspecified diastolic (congestive) heart failure: Secondary | ICD-10-CM | POA: Diagnosis present

## 2019-06-18 DIAGNOSIS — K219 Gastro-esophageal reflux disease without esophagitis: Secondary | ICD-10-CM | POA: Diagnosis present

## 2019-06-18 DIAGNOSIS — Z8711 Personal history of peptic ulcer disease: Secondary | ICD-10-CM

## 2019-06-18 DIAGNOSIS — N179 Acute kidney failure, unspecified: Secondary | ICD-10-CM | POA: Diagnosis present

## 2019-06-18 DIAGNOSIS — K5090 Crohn's disease, unspecified, without complications: Secondary | ICD-10-CM | POA: Diagnosis present

## 2019-06-18 DIAGNOSIS — E119 Type 2 diabetes mellitus without complications: Secondary | ICD-10-CM | POA: Diagnosis not present

## 2019-06-18 DIAGNOSIS — I10 Essential (primary) hypertension: Secondary | ICD-10-CM

## 2019-06-18 DIAGNOSIS — Z9119 Patient's noncompliance with other medical treatment and regimen: Secondary | ICD-10-CM

## 2019-06-18 DIAGNOSIS — G40101 Localization-related (focal) (partial) symptomatic epilepsy and epileptic syndromes with simple partial seizures, not intractable, with status epilepticus: Secondary | ICD-10-CM | POA: Diagnosis not present

## 2019-06-18 DIAGNOSIS — Z8616 Personal history of COVID-19: Secondary | ICD-10-CM

## 2019-06-18 DIAGNOSIS — Z823 Family history of stroke: Secondary | ICD-10-CM

## 2019-06-18 DIAGNOSIS — E1122 Type 2 diabetes mellitus with diabetic chronic kidney disease: Secondary | ICD-10-CM | POA: Diagnosis present

## 2019-06-18 DIAGNOSIS — I130 Hypertensive heart and chronic kidney disease with heart failure and stage 1 through stage 4 chronic kidney disease, or unspecified chronic kidney disease: Secondary | ICD-10-CM | POA: Diagnosis present

## 2019-06-18 DIAGNOSIS — D631 Anemia in chronic kidney disease: Secondary | ICD-10-CM | POA: Diagnosis present

## 2019-06-18 DIAGNOSIS — M1990 Unspecified osteoarthritis, unspecified site: Secondary | ICD-10-CM | POA: Diagnosis present

## 2019-06-18 LAB — CBG MONITORING, ED: Glucose-Capillary: 181 mg/dL — ABNORMAL HIGH (ref 70–99)

## 2019-06-18 LAB — COMPREHENSIVE METABOLIC PANEL
ALT: 93 U/L — ABNORMAL HIGH (ref 0–44)
AST: 111 U/L — ABNORMAL HIGH (ref 15–41)
Albumin: 4 g/dL (ref 3.5–5.0)
Alkaline Phosphatase: 105 U/L (ref 38–126)
Anion gap: 13 (ref 5–15)
BUN: 19 mg/dL (ref 8–23)
CO2: 18 mmol/L — ABNORMAL LOW (ref 22–32)
Calcium: 10 mg/dL (ref 8.9–10.3)
Chloride: 108 mmol/L (ref 98–111)
Creatinine, Ser: 1.27 mg/dL — ABNORMAL HIGH (ref 0.44–1.00)
GFR calc Af Amer: 47 mL/min — ABNORMAL LOW (ref >60)
GFR calc non Af Amer: 41 mL/min — ABNORMAL LOW (ref >60)
Glucose, Bld: 206 mg/dL — ABNORMAL HIGH (ref 70–99)
Potassium: 4.1 mmol/L (ref 3.5–5.1)
Sodium: 139 mmol/L (ref 135–145)
Total Bilirubin: 0.5 mg/dL (ref 0.3–1.2)
Total Protein: 8.6 g/dL — ABNORMAL HIGH (ref 6.5–8.1)

## 2019-06-18 LAB — CBC
HCT: 41.8 % (ref 36.0–46.0)
Hemoglobin: 13.8 g/dL (ref 12.0–15.0)
MCH: 30.4 pg (ref 26.0–34.0)
MCHC: 33 g/dL (ref 30.0–36.0)
MCV: 92.1 fL (ref 80.0–100.0)
Platelets: 218 10*3/uL (ref 150–400)
RBC: 4.54 MIL/uL (ref 3.87–5.11)
RDW: 14.9 % (ref 11.5–15.5)
WBC: 10.3 10*3/uL (ref 4.0–10.5)
nRBC: 0 % (ref 0.0–0.2)

## 2019-06-18 LAB — URINALYSIS, ROUTINE W REFLEX MICROSCOPIC
Bilirubin Urine: NEGATIVE
Glucose, UA: NEGATIVE mg/dL
Hgb urine dipstick: NEGATIVE
Ketones, ur: NEGATIVE mg/dL
Leukocytes,Ua: NEGATIVE
Nitrite: NEGATIVE
Protein, ur: NEGATIVE mg/dL
Specific Gravity, Urine: <1.005 — ABNORMAL LOW (ref 1.005–1.030)
pH: 6.5 (ref 5.0–8.0)

## 2019-06-18 LAB — SARS CORONAVIRUS 2 (TAT 6-24 HRS): SARS Coronavirus 2: NEGATIVE

## 2019-06-18 LAB — CK: Total CK: 44 U/L (ref 38–234)

## 2019-06-18 LAB — LACTIC ACID, PLASMA
Lactic Acid, Venous: 3.9 mmol/L (ref 0.5–1.9)
Lactic Acid, Venous: 1.7 mmol/L (ref 0.5–1.9)

## 2019-06-18 MED ORDER — SODIUM CHLORIDE 0.9 % IV SOLN
200.0000 mg | Freq: Once | INTRAVENOUS | Status: AC
  Administered 2019-06-18: 200 mg via INTRAVENOUS
  Filled 2019-06-18 (×2): qty 20

## 2019-06-18 MED ORDER — ONDANSETRON HCL 4 MG/2ML IJ SOLN: 4.0000 mg | Freq: Four times a day (QID) | INTRAMUSCULAR | Status: DC | PRN

## 2019-06-18 MED ORDER — LACOSAMIDE 50 MG PO TABS
225.0000 mg | ORAL_TABLET | Freq: Every day | ORAL | Status: DC
  Filled 2019-06-18: qty 1

## 2019-06-18 MED ORDER — ACETAMINOPHEN 325 MG PO TABS: 650.0000 mg | ORAL_TABLET | Freq: Four times a day (QID) | ORAL | Status: DC | PRN

## 2019-06-18 MED ORDER — ACETAMINOPHEN 650 MG RE SUPP: 650.0000 mg | Freq: Four times a day (QID) | RECTAL | Status: DC | PRN

## 2019-06-18 MED ORDER — HYDRALAZINE HCL 25 MG PO TABS
25.0000 mg | ORAL_TABLET | Freq: Three times a day (TID) | ORAL | Status: DC
  Administered 2019-06-18 – 2019-06-21 (×8): 25 mg via ORAL
  Filled 2019-06-18 (×8): qty 1

## 2019-06-18 MED ORDER — SODIUM CHLORIDE 0.9 % IV SOLN
200.0000 mg | Freq: Two times a day (BID) | INTRAVENOUS | Status: DC
  Administered 2019-06-18 – 2019-06-19 (×2): 200 mg via INTRAVENOUS
  Filled 2019-06-18 (×4): qty 20

## 2019-06-18 MED ORDER — SODIUM CHLORIDE 0.9 % IV BOLUS
1000.0000 mL | Freq: Once | INTRAVENOUS | Status: AC
  Administered 2019-06-18: 1000 mL via INTRAVENOUS

## 2019-06-18 MED ORDER — ONDANSETRON HCL 4 MG PO TABS: 4.0000 mg | ORAL_TABLET | Freq: Four times a day (QID) | ORAL | Status: DC | PRN

## 2019-06-18 MED ORDER — ATORVASTATIN CALCIUM 40 MG PO TABS
40.0000 mg | ORAL_TABLET | Freq: Every day | ORAL | Status: DC
  Administered 2019-06-19 – 2019-06-20 (×2): 40 mg via ORAL
  Filled 2019-06-18 (×2): qty 1

## 2019-06-18 MED ORDER — LACOSAMIDE 50 MG PO TABS: 300.0000 mg | ORAL_TABLET | Freq: Every day | ORAL | Status: DC

## 2019-06-18 MED ORDER — LEVETIRACETAM 750 MG PO TABS: 1500.0000 mg | ORAL_TABLET | Freq: Two times a day (BID) | ORAL | Status: DC

## 2019-06-18 MED ORDER — LORAZEPAM 2 MG/ML IJ SOLN
INTRAMUSCULAR | Status: AC
  Administered 2019-06-18: 2 mg via INTRAVENOUS
  Filled 2019-06-18: qty 2

## 2019-06-18 MED ORDER — LEVETIRACETAM IN NACL 1000 MG/100ML IV SOLN
1000.0000 mg | Freq: Once | INTRAVENOUS | Status: AC
  Administered 2019-06-18: 1000 mg via INTRAVENOUS
  Filled 2019-06-18: qty 100

## 2019-06-18 MED ORDER — INSULIN ASPART 100 UNIT/ML ~~LOC~~ SOLN
0.0000 [IU] | SUBCUTANEOUS | Status: DC
  Administered 2019-06-19 – 2019-06-20 (×2): 1 [IU] via SUBCUTANEOUS
  Administered 2019-06-20: 2 [IU] via SUBCUTANEOUS
  Administered 2019-06-21: 13:00:00 1 [IU] via SUBCUTANEOUS
  Administered 2019-06-21: 2 [IU] via SUBCUTANEOUS

## 2019-06-18 MED ORDER — SODIUM CHLORIDE 0.9 % IV SOLN: INTRAVENOUS | Status: DC

## 2019-06-18 MED ORDER — LACOSAMIDE 150 MG PO TABS: 2.0000 | ORAL_TABLET | Freq: Two times a day (BID) | ORAL | Status: DC

## 2019-06-18 MED ORDER — ENOXAPARIN SODIUM 40 MG/0.4ML ~~LOC~~ SOLN
40.0000 mg | Freq: Every day | SUBCUTANEOUS | Status: DC
  Administered 2019-06-18 – 2019-06-20 (×3): 40 mg via SUBCUTANEOUS
  Filled 2019-06-18 (×3): qty 0.4

## 2019-06-18 MED ORDER — HYDROCODONE-ACETAMINOPHEN 5-325 MG PO TABS
1.0000 | ORAL_TABLET | ORAL | Status: DC | PRN
  Administered 2019-06-19: 2 via ORAL
  Filled 2019-06-18: qty 2

## 2019-06-18 MED ORDER — ISOSORBIDE MONONITRATE ER 30 MG PO TB24
30.0000 mg | ORAL_TABLET | Freq: Every day | ORAL | Status: DC
  Administered 2019-06-19 – 2019-06-21 (×3): 30 mg via ORAL
  Filled 2019-06-18 (×3): qty 1

## 2019-06-18 MED ORDER — LORAZEPAM 2 MG/ML IJ SOLN: 2.0000 mg | Freq: Once | INTRAMUSCULAR | Status: AC

## 2019-06-18 MED ORDER — CLOPIDOGREL BISULFATE 75 MG PO TABS
75.0000 mg | ORAL_TABLET | Freq: Every day | ORAL | Status: DC
  Administered 2019-06-19 – 2019-06-21 (×3): 75 mg via ORAL
  Filled 2019-06-18 (×3): qty 1

## 2019-06-18 MED ORDER — LEVETIRACETAM IN NACL 1500 MG/100ML IV SOLN
1500.0000 mg | Freq: Two times a day (BID) | INTRAVENOUS | Status: DC
  Administered 2019-06-18 – 2019-06-19 (×2): 1500 mg via INTRAVENOUS
  Filled 2019-06-18 (×4): qty 100

## 2019-06-18 NOTE — H&P (Signed)
Michelle Farrell YYQ:[id number] DOB: 1940-08-30 DOA: 06/18/2019     PCP: Doretha Sou, MD   Outpatient Specialists:    Neurology   Dr[facility]Boggs at [facility]   Patient arrived to ER on 06/18/19 at 0828  Patient coming from: home Lives With family    Chief Complaint:  Chief Complaint  Patient presents with  . Seizures    HPI: Michelle Farrell is a 79 y.o. female with medical history significant of  CKD, CHF, COPD, seizures, stroke, sleep apnea, HTN, DM, Crohn's, and COVID-19 infection in 02/2019     Presented with seizures pt started seizing at 7:30 AM according to family members she has been minimally responsive has not eaten anything this morning.  Initially she was staring at the right well evaluated in [place] her daughter stated that is typical for her seizures when they last for too long states patient has history of prolonged his seizures.  At [facility] seizures were able to be stopped with Ativan patient at baseline takes Keppra and Vimpat but has still occasional breakthrough seizures neurology has been consulted while at [facility] and secondary to patient being slow to return to baseline she is to be observed overnight. 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  Has  been vaccinated against COVID 1st shot was due to get her second shot tommorow  In  ER RAPID COVID TEST  NEGATIVE     No results found for: SARSCOV2NAA   Regarding pertinent Chronic problems:    Hyperlipidemia - Lipitor   HTN on hydralazine   chronic CHF diastolic - last echo 8/88/9169   DM 2 -  Lab Results  Component Value Date   HGBA1C 5.3 06/20/2014   diet controlled      COPD - not  followed by pulmonology  ,  PF Readings from Last 1 Encounters:  No data found for PF   Seizure Do since CVA on Vimpat, keppra,    OSA - not  on  CPAP    Hx of CVA -  with out on Plavix    CKD stage III - baseline Cr  1.4 Lab Results  Component Value Date   CREATININE 1.27 (H) 06/18/2019   CREATININE 1.42 (H) 03/23/2019   CREATININE 1.40 (H) 01/21/2017      While in ER: Initially with prolonged seizure activity was given 2 mg of Ativan and loaded with IV Keppra 1 g as well as given a dose of Vimpat IV   ER Provider Called:     Dr.Arora They Recommend admit to medicine      Hospitalist was called for admission for prolonged seizure activity initially worrisome for status epilepticus  The following Work up has been ordered so far:  Orders Placed This Encounter  Procedures  . Critical Care  . SARS CORONAVIRUS 2 (TAT 6-24 HRS) Nasopharyngeal Nasopharyngeal Swab  . CT HEAD WO CONTRAST  . DG Chest Port 1 View  . CBC  . Comprehensive metabolic panel  . Lactic acid, plasma  . Urinalysis, Routine w reflex microscopic  . CK  . Lactic acid, plasma  . Cardiac monitoring  . In and Out Cath  . Consult to hospitalist  ALL PATIENTS BEING ADMITTED/HAVING PROCEDURES NEED COVID-19 SCREENING  . POC CBG, ED  . EKG 12-Lead  . EKG 12-Lead  .  Insert peripheral IV  . Place in observation (patient's expected length of stay will be less than 2 midnights)  . Seizure precautions    Following Medications were ordered in ER: Medications  lacosamide (VIMPAT) 200 mg in sodium chloride 0.9 % 25 mL IVPB (has no administration in time range)  levETIRAcetam (KEPPRA) IVPB 1500 mg/ 100 mL premix (has no administration in time range)  LORazepam (ATIVAN) injection 2 mg (2 mg Intravenous Given 06/18/19 0840)  sodium chloride 0.9 % bolus 1,000 mL ( Intravenous Stopped 06/18/19 1058)  levETIRAcetam (KEPPRA) IVPB 1000 mg/100 mL premix (0 mg Intravenous Stopped 06/18/19 1030)  lacosamide (VIMPAT) 200 mg in sodium chloride 0.9 % 25 mL IVPB ( Intravenous Stopped 06/18/19 1117)  sodium chloride 0.9 % bolus 1,000 mL ( Intravenous Stopped 06/18/19 1337)         Consult Orders  (From admission, onward)         Start     Ordered   06/18/19 1432  Consult to hospitalist  ALL PATIENTS BEING ADMITTED/HAVING PROCEDURES NEED COVID-19 SCREENING Called [facility] spoke with Marcello Moores  Once    Comments: ALL PATIENTS BEING ADMITTED/HAVING PROCEDURES NEED COVID-19 SCREENING  Provider:  (Not yet assigned)  Question Answer Comment  Place call to: [facility] [place]   Reason for Consult Admit      06/18/19 1431          Significant initial  Findings: Abnormal Labs Reviewed  COMPREHENSIVE METABOLIC PANEL - Abnormal; Notable for the following components:      Result Value   CO2 18 (*)    Glucose, Bld 206 (*)    Creatinine, Ser 1.27 (*)    Total Protein 8.6 (*)    AST 111 (*)    ALT 93 (*)    GFR calc non Af Amer 41 (*)    GFR calc Af Amer 47 (*)    All other components within normal limits  LACTIC ACID, PLASMA - Abnormal; Notable for the following components:   Lactic Acid, Venous 3.9 (*)    All other components within normal limits  URINALYSIS, ROUTINE W REFLEX MICROSCOPIC - Abnormal; Notable for the following components:   APPearance CLOUDY (*)    Specific Gravity, Urine <1.005 (*)    All other components within normal limits  CBG MONITORING, ED - Abnormal; Notable for the following components:   Glucose-Capillary 181 (*)    All other components within normal limits    Otherwise labs showing:    Recent Labs  Lab 06/18/19 0852  NA 139  K 4.1  CO2 18*  GLUCOSE 206*  BUN 19  CREATININE 1.27*  CALCIUM 10.0    Cr stable,    Lab Results  Component Value Date   CREATININE 1.27 (H) 06/18/2019   CREATININE 1.42 (H) 03/23/2019   CREATININE 1.40 (H) 01/21/2017    Recent Labs  Lab 06/18/19 0852  AST 111*  ALT 93*  ALKPHOS 105  BILITOT 0.5  PROT 8.6*  ALBUMIN 4.0   Lab Results  Component Value Date   CALCIUM 10.0 06/18/2019      WBC      Component Value Date/Time   WBC 10.3 06/18/2019 0852   ANC     Component Value Date/Time   NEUTROABS 8.3 (H) 03/23/2019 1615     Plt: Lab Results  Component Value Date   PLT 218 06/18/2019     Lactic Acid, Venous    Component Value Date/Time   LATICACIDVEN 1.7 06/18/2019 1323  COVID-19 Labs  No results for input(s): DDIMER, FERRITIN, LDH, CRP in the last 72 hours.  Lab Results  Component Value Date   [place] NEGATIVE 06/18/2019       HG/HCT  stable,      Component Value Date/Time   HGB 13.8 06/18/2019 0852   HCT 41.8 06/18/2019 0852    No results for input(s): LIPASE, AMYLASE in the last 168 hours. No results for input(s): AMMONIA in the last 168 hours.  No components found for: LABALBU    Cardiac Panel (last 3 results) Recent Labs    06/18/19 0852  CKTOTAL 44     ECG: Ordered Personally reviewed by me showing: HR : 111 Rhythm:  NSR,     no evidence of ischemic changes [id number]   DM  labs:  HbA1C: No results for input(s): HGBA1C in the last 8760 hours.     CBG (last 3)  Recent Labs    06/18/19 0849  GLUCAP 181*        UA   no evidence of UTI      Urine analysis:    Component Value Date/Time   COLORURINE YELLOW 06/18/2019 1217   APPEARANCEUR CLOUDY (A) 06/18/2019 1217   LABSPEC <1.005 (L) 06/18/2019 1217   PHURINE 6.5 06/18/2019 1217   GLUCOSEU NEGATIVE 06/18/2019 1217   HGBUR NEGATIVE 06/18/2019 1217   BILIRUBINUR NEGATIVE 06/18/2019 1217   KETONESUR NEGATIVE 06/18/2019 1217   PROTEINUR NEGATIVE 06/18/2019 1217   UROBILINOGEN 0.2 09/12/2014 0915   NITRITE NEGATIVE 06/18/2019 1217   LEUKOCYTESUR NEGATIVE 06/18/2019 1217      Ordered  CT HEAD  NON acute  CXR -  NON acute     ED Triage Vitals  Enc Vitals Group     BP 06/18/19 0837 (!) 195/76     Pulse Rate 06/18/19 0837 77     Resp 06/18/19 0837 (!) 32     Temp 06/18/19 0853 97.7 F (36.5 C)     Temp Source 06/18/19 0853 Oral     SpO2 06/18/19 0837 (!) 86 %     Weight 06/18/19 1051 120 lb (54.4 kg)     Height 06/18/19 1051 5' 6"   (1.676 m)     Head Circumference --      Peak Flow --      Pain Score 06/18/19 1834 Asleep     Pain Loc --      Pain Edu? --      Excl. in [place]? --   TMAX(24)@       Latest  Blood pressure (!) 176/63, pulse (!) 52, temperature 99.8 F (37.7 C), temperature source Oral, resp. rate 18, height 5' 6"  (1.676 m), weight 54.4 kg, SpO2 100 %.      Review of Systems:    Pertinent positives include:  confusion  Constitutional:  No weight loss, night sweats, Fevers, chills, fatigue, weight loss  HEENT:  No headaches, Difficulty swallowing,Tooth/dental problems,Sore throat,  No sneezing, itching, ear ache, nasal congestion, post nasal drip,  Cardio-vascular:  No chest pain, Orthopnea, PND, anasarca, dizziness, palpitations.no Bilateral lower extremity swelling  GI:  No heartburn, indigestion, abdominal pain, nausea, vomiting, diarrhea, change in bowel habits, loss of appetite, melena, blood in stool, hematemesis Resp:  no shortness of breath at rest. No dyspnea on exertion, No excess mucus, no productive cough, No non-productive cough, No coughing up of blood.No change in color of mucus.No wheezing. Skin:  no rash or lesions. No jaundice GU:  no dysuria, change in color  of urine, no urgency or frequency. No straining to urinate.  No flank pain.  Musculoskeletal:  No joint pain or no joint swelling. No decreased range of motion. No back pain.  Psych:  No change in mood or affect. No depression or anxiety. No memory loss.  Neuro: no localizing neurological complaints, no tingling, no weakness, no double vision, no gait abnormality, no slurred speech,    All systems reviewed and apart from [place] all are negative  Past Medical History:   Past Medical History:  Diagnosis Date  . AKI (acute kidney injury) ([place])   . Arthritis   . Back pain, chronic   . CHF (congestive heart failure) ([place])   . CKD (chronic kidney disease)   . COPD (chronic obstructive pulmonary disease) ([place])   . Crohn  disease ([place])   . Diabetes mellitus without complication ([place])   . Esophageal stricture    s/p dilation 2003, 2008  . GERD (gastroesophageal reflux disease)   . Hip pain, chronic   . Hypertension   . PUD (peptic ulcer disease)   . Seizures ([place])   . Sleep apnea   . Stroke [facility])       Past Surgical History:  Procedure Laterality Date  . ABDOMINAL HYSTERECTOMY    . BLADDER SURGERY    . CHOLECYSTECTOMY    . ESOPHAGOGASTRODUODENOSCOPY N/A 07/08/2014   Procedure: ESOPHAGOGASTRODUODENOSCOPY (EGD);  Surgeon: Ladene Artist, MD;  [facility]COPY;  Service: Endoscopy;  Laterality: N/A;  . SKIN CANCER EXCISION    . TONSILLECTOMY      Social History:  Ambulatory walker       reports that she quit smoking about 5 years ago. Her smoking use included cigarettes. She has a 30.00 pack-year smoking history. She has never used smokeless tobacco. She reports that she does not drink alcohol or use drugs.   Family History:   Family History  Problem Relation Age of Onset  . Ovarian cancer Maternal Grandmother   . Stroke Mother   . Stroke Brother     Allergies: Allergies  Allergen Reactions  . Tramadol     Seizures      Prior to Admission medications   Medication Sig Start Date End Date Taking? Authorizing Provider  atorvastatin (LIPITOR) 40 MG tablet TAKE 1 TABLET(40 MG) BY MOUTH EVERY NIGHT 08/16/16   [provider]  clopidogrel (PLAVIX) 75 MG tablet Take 1 tablet (75 mg total) by mouth daily. 09/14/14   Patrecia Pour, MD  hydrALAZINE (APRESOLINE) 25 MG tablet Take 25 mg by mouth. 03/16/15   [provider]  isosorbide mononitrate (IMDUR) 30 MG 24 hr tablet TK 1 T PO QD 04/14/15   [provider]  levETIRAcetam (KEPPRA) 1000 MG tablet Take 525m in the and 10028mat night Patient taking differently: Takes 1500 qAM and 1000 at night 11/07/16   SeGarvin FilaMD  VIMPAT 150 MG TABS TAKE 2 TABLETS BY MOUTH TWICE DAILY Patient taking differently:  Takes 22572mn the morning and 300 mg at night 04/21/17   WilKathrynn DuckingD   Physical Exam: Blood pressure (!) 176/63, pulse (!) 52, temperature 99.8 F (37.7 C), temperature source Oral, resp. rate 18, height 5' 6"  (1.676 m), weight 54.4 kg, SpO2 100 %. 1. General:  in No  Acute distress    Chronically ill   -appearing 2. Psychological: Alert and  Oriented to self 3. Head/ENT:    Dry Mucous Membranes  Head Non traumatic, neck supple                           Poor Dentition 4. SKIN:  decreased Skin turgor,  Skin clean Dry and intact no rash 5. Heart: Regular rate and rhythm no Murmur, no Rub or gallop 6. Lungs:  no wheezes or crackles   7. Abdomen: Soft,  non-tender, Non distended bowel sounds present 8. Lower extremities: no clubbing, cyanosis, no  edema 9. Neurologically not following commands 10. MSK: Normal range of motion   All other LABS:     Recent Labs  Lab 06/18/19 0852  WBC 10.3  HGB 13.8  HCT 41.8  MCV 92.1  PLT 218     Recent Labs  Lab 06/18/19 0852  NA 139  K 4.1  CL 108  CO2 18*  GLUCOSE 206*  BUN 19  CREATININE 1.27*  CALCIUM 10.0     Recent Labs  Lab 06/18/19 0852  AST 111*  ALT 93*  ALKPHOS 105  BILITOT 0.5  PROT 8.6*  ALBUMIN 4.0       Cultures:    Component Value Date/Time   SDES  03/23/2019 1557    URINE, RANDOM Performed at [facility], [address] [place], [place] [address]    [facility]  03/23/2019 1557    NONE Performed at [facility], [address] [place], [place] [address]    CULT (A) 03/23/2019 1557    <10,000 COLONIES/mL INSIGNIFICANT GROWTH Performed at [address], [place], [place] [address]    REPTSTATUS 03/25/2019 FINAL 03/23/2019 1557     Radiological Exams on Admission: CT HEAD WO CONTRAST  Result Date: 06/18/2019 CLINICAL DATA:  Status epilepticus. EXAM: CT HEAD WITHOUT CONTRAST TECHNIQUE: Contiguous axial images were  obtained from the base of the skull through the vertex without intravenous contrast. COMPARISON:  03/27/2019 head CT. FINDINGS: Brain: No evidence of parenchymal hemorrhage or extra-axial fluid collection. No mass lesion, mass effect, or midline shift. No CT evidence of acute infarction. Nonspecific moderate subcortical and periventricular white matter hypodensity, most in keeping with chronic small vessel ischemic change. Chronic mild anterior right temporal lobe encephalomalacia. No ventriculomegaly. Vascular: No acute abnormality. Skull: No evidence of calvarial fracture. Sinuses/Orbits: No fluid levels. Mild mucoperiosteal thickening in the right maxillary sinus. Other:  The mastoid air cells are unopacified. IMPRESSION: 1. No evidence of acute intracranial abnormality. No evidence of calvarial fracture. 2. Chronic mild anterior right temporal lobe encephalomalacia. 3. Moderate chronic small vessel ischemic changes in the cerebral white matter. Electronically Signed   By: Ilona Sorrel M.D.   On: 06/18/2019 09:43   DG Chest Port 1 View  Result Date: 06/18/2019 CLINICAL DATA:  Aspiration during seizure. EXAM: PORTABLE CHEST 1 VIEW COMPARISON:  March 30, 2019. FINDINGS: The heart size and mediastinal contours are within normal limits. Both lungs are clear. No pneumothorax or pleural effusion is noted. The visualized skeletal structures are unremarkable. IMPRESSION: No acute cardiopulmonary abnormality seen. Aortic Atherosclerosis (ICD10-I70.0). Electronically Signed   By: Marijo Conception M.D.   On: 06/18/2019 09:36    Chart has been reviewed   Assessment/Plan  79 y.o. female with medical history significant of  CKD, CHF, COPD, seizures, stroke, sleep apnea, HTN, DM, Crohn's, and COVID-19 infection in 02/2019     Admitted for seizure with prolonged post ictal state  Present on Admission:  Prolonged seizure activity/status  epilepticus family stated the patient has history of prolonged seizure  activity patient seemed to improve with Ativan Keppra and Vimpat Work-up has been reassuring no signs of UTI CT head unremarkable Patient was not back to baseline neurology recommended admission for observation Neurology recommended increasing Keppra to 1500 twice a day Other plan as per orders. . Acute encephalopathy -in the setting of prolonged postictal state Will obtain eeg restart homemeds, neurology consult to evaluate for status epillepticus  DM2-  - Order Sensitive  SSI   -  check TSH and HgA1C  - Hold by mouth medications    . Anemia in chronic kidney disease chronic continue to monitor  . OBSTRUCTIVE SLEEP APNEA patient not compliant with CPAP  . Hypertension restart home medications  . Diastolic CHF (HCC) chronic stable currently appears to be slightly on the dry side  . Crohn's disease ([place]) -chronic currently stable   . Tobacco abuse-we will discuss importance of quitting once able to have conversation patient  . Dyslipidemia stable continue home medications  DVT prophylaxis:   Lovenox     Code Status:  FULL CODE as per family  I had personally discussed CODE STATUS with   Family      Family Communication:   Family not at  Bedside  plan of care was discussed on the phone with grandDaughter,      Disposition Plan:    To home once workup is complete and patient is stable    Following barriers for discharge:                                                          Will need to be able to tolerate PO                                                      Will need consultants to evaluate patient prior to discharge                    Would benefit from PT/OT eval prior to DC  Ordered                   Swallow eval - SLP ordered                                                      Consults called:   Neurology consulted will see on the floor Admission status:  ED Disposition    ED Disposition Condition [place]: [place]  [[id number]]  Level of Care: Telemetry Medical [[id number]]  I expect the patient will be discharged within 24 hours: Yes  LOW acuity---Tx typically complete <24 hrs---ACUTE conditions typically can be evaluated <24 hours---LABS likely to return to acceptable levels <24 hours---IS near functional baseline---EXPECTED to return to current living arrangement---NOT newly hypoxic: Meets criteria for 5C-Observation unit  Covid Evaluation: Asymptomatic Screening Protocol (No Symptoms)  Diagnosis: Seizure ([place]) [[id number]]  Admitting Physician: Karmen Bongo [  [place]  Attending Physician: Karmen Bongo [[id number]]        obs    Level of care tele  For  24H     Precautions: admitted as asymptomatic screening protocol    PPE: Used by the provider:   P100  eye Goggles,  Gloves     Cailen Mihalik 06/19/2019, 12:00 AM    [facility]     after 2 AM please page floor coverage PA If 7AM-7PM, please contact the day team taking care of the patient using [email or web address]   Patient was evaluated in the context of the global COVID-19 pandemic, which necessitated consideration that the patient might be at risk for infection with the SARS-CoV-2 virus that causes COVID-19. Institutional protocols and algorithms that pertain to the evaluation of patients at risk for COVID-19 are in a state of rapid change based on information released by regulatory bodies including the CDC and federal and state organizations. These policies and algorithms were followed during the patient's care.

## 2019-06-18 NOTE — Progress Notes (Signed)
[facility] to [facility] transfer:  Patient with h/o CKD, CHF, COPD, seizures, stroke, sleep apnea, HTN, DM, Crohn's, and COVID-19 infection in 02/2019 presenting with seizures.  She presented with status epilepticus, stopped with Ativan.  Takes Keppra and Vimpat with good compliance but still with breakthrough seizures.  She has been slow to return to baseline.  Dr. Rory Percy recommends observation due to slow return to baseline.  If she is able to return to baseline before she gets a bed, it is reasonable to d/c her to home with outpatient neurology f/u this week.  However, will place a telemetry bed order in case her persistently slow cognition continues.  Carlyon Shadow, M.D.

## 2019-06-18 NOTE — ED Notes (Signed)
Non-rebreather applied due to low O2 Sat on 4L [place] post Ativan.

## 2019-06-18 NOTE — ED Triage Notes (Signed)
Per family member pt started seizing at 39.  Pt stares off and is minimally responsive.  Has not eaten this morning.  Brought to ED by her granddaughter who is a main caregiver for her.  Pt minimally responsive at this time. Rattling respirations.  Staring to the right.  Granddaughter sts this is typical of the seizures that last longer.  Sometimes they are short and sometimes they prolong.

## 2019-06-18 NOTE — ED Provider Notes (Signed)
[facility] Emergency Department Provider Note MRN:  [id number]  Arrival date & time: 06/18/19     Chief Complaint   Seizures   History of Present Illness   Michelle Farrell is a 79 y.o. year-old female with a history of diabetes, seizure disorder presenting to the ED with chief complaint of seizures.  Patient's daughter-in-law provides history.  Patient having her typical seizure activity at home this morning, at first just staring out into space.  Began having more significant seizures with gaze preference to the right, mild shaking, was initially going in and out of seizure activity but then began not coming out of the seizure activity, prompting emergency department evaluation.  Estimated total time of seizure activity about 45 minutes prior to ED arrival.  No recent illness or fever, no recent head trauma, no significant added history noted by daughter-in-law.  I was unable to obtain an accurate HPI, PMH, or ROS due to the patient's seizure activity.  Level 5 caveat.  Review of Systems  A complete 10 system review of systems was obtained and all systems are negative except as noted in the HPI and PMH.   Patient's Health History    Past Medical History:  Diagnosis Date  . AKI (acute kidney injury) ([place])   . Arthritis   . Back pain, chronic   . CHF (congestive heart failure) ([place])   . CKD (chronic kidney disease)   . COPD (chronic obstructive pulmonary disease) ([place])   . Crohn disease ([place])   . Diabetes mellitus without complication ([place])   . Esophageal stricture    s/p dilation 2003, 2008  . GERD (gastroesophageal reflux disease)   . Hip pain, chronic   . Hypertension   . PUD (peptic ulcer disease)   . Seizures ([place])   . Sleep apnea   . Stroke [facility])     Past Surgical History:  Procedure Laterality Date  . ABDOMINAL HYSTERECTOMY    . BLADDER SURGERY    . CHOLECYSTECTOMY    . ESOPHAGOGASTRODUODENOSCOPY N/A 07/08/2014   Procedure:  ESOPHAGOGASTRODUODENOSCOPY (EGD);  Surgeon: Ladene Artist, MD;  Location[facility] END[facility]COPY;  Service: Endoscopy;  Laterality: N/A;  . SKIN CANCER EXCISION    . TONSILLECTOMY      Family History  Problem Relation Age of Onset  . Ovarian cancer Maternal Grandmother   . Stroke Mother   . Stroke Brother     Social History   Socioeconomic History  . Marital status: Widowed    Spouse name: Not on file  . Number of children: Not on file  . Years of education: Not on file  . Highest education level: Not on file  Occupational History  . Not on file  Tobacco Use  . Smoking status: Former Smoker    Packs/day: 1.00    Years: 30.00    Pack years: 30.00    Types: Cigarettes    Quit date: 05/27/2014    Years since quitting: 5.0  . Smokeless tobacco: Never Used  Substance and Sexual Activity  . Alcohol use: No  . Drug use: No  . Sexual activity: Never  Other Topics Concern  . Not on file  Social History Narrative   24 oz of caffeine a day    Lives alone at home occasionally with granddaughter     Widowed and disabled    Social Determinants of Health   Financial Resource Strain:   . Difficulty of Paying Living Expenses:   Food Insecurity:   .  Worried About Charity fundraiser in the Last Year:   . Arboriculturist in the Last Year:   Transportation Needs:   . Film/video editor (Medical):   Marland Kitchen Lack of Transportation (Non-Medical):   Physical Activity:   . Days of Exercise per Week:   . Minutes of Exercise per Session:   Stress:   . Feeling of Stress :   Social Connections:   . Frequency of Communication with Friends and Family:   . Frequency of Social Gatherings with Friends and Family:   . Attends Religious Services:   . Active Member of Clubs or Organizations:   . Attends Archivist Meetings:   Marland Kitchen Marital Status:   Intimate Partner Violence:   . Fear of Current or Ex-Partner:   . Emotionally Abused:   Marland Kitchen Physically Abused:   . Sexually Abused:      Physical  Exam   Vitals:   06/18/19 1430 06/18/19 1432  BP: (!) 128/58 (!) 128/58  Pulse: (!) 56 (!) 58  Resp: (!) 27 18  Temp:    SpO2: 99% 100%    CONSTITUTIONAL: Ill-appearing NEURO: Unresponsive, increased tone and rigidity to all 4 extremities, gaze preference to the right, no blink to threat, pupils reactive, shaking activity to the right arm and leg EYES:  eyes equal and reactive ENT/NECK:  no LAD, no JVD CARDIO: Regular rate, well-perfused, normal S1 and S2 PULM:  CTAB no wheezing or rhonchi GI/GU:  normal bowel sounds, non-distended, non-tender MSK/SPINE:  No gross deformities, no edema SKIN:  no rash, atraumatic PSYCH:  Appropriate speech and behavior  *Additional and/or pertinent findings included in MDM below  Diagnostic and Interventional Summary    EKG Interpretation  Date/Time:  Tuesday June 18 2019 08:45:11 EDT Ventricular Rate:  68 PR Interval:    QRS Duration: 111 QT Interval:  394 QTC Calculation: 416 R Axis:   -45 Text Interpretation: Sinus rhythm Short PR interval LAD, consider left anterior fascicular block Abnormal R-wave progression, late transition Left ventricular hypertrophy Confirmed by Gerlene Fee [phone]) on 06/18/2019 8:57:25 AM      Labs Reviewed  COMPREHENSIVE METABOLIC PANEL - Abnormal; Notable for the following components:      Result Value   CO2 18 (*)    Glucose, Bld 206 (*)    Creatinine, Ser 1.27 (*)    Total Protein 8.6 (*)    AST 111 (*)    ALT 93 (*)    GFR calc non Af Amer 41 (*)    GFR calc Af Amer 47 (*)    All other components within normal limits  LACTIC ACID, PLASMA - Abnormal; Notable for the following components:   Lactic Acid, Venous 3.9 (*)    All other components within normal limits  URINALYSIS, ROUTINE W REFLEX MICROSCOPIC - Abnormal; Notable for the following components:   APPearance CLOUDY (*)    Specific Gravity, Urine <1.005 (*)    All other components within normal limits  CBG MONITORING, ED - Abnormal;  Notable for the following components:   Glucose-Capillary 181 (*)    All other components within normal limits  SARS CORONAVIRUS 2 (TAT 6-24 HRS)  CBC  CK  LACTIC ACID, PLASMA    CT HEAD WO CONTRAST  Final Result    DG Chest Port 1 View  Final Result      Medications  LORazepam (ATIVAN) injection 2 mg (2 mg Intravenous Given 06/18/19 0840)  sodium chloride 0.9 % bolus 1,000  mL ( Intravenous Stopped 06/18/19 1058)  levETIRAcetam (KEPPRA) IVPB 1000 mg/100 mL premix (0 mg Intravenous Stopped 06/18/19 1030)  lacosamide (VIMPAT) 200 mg in sodium chloride 0.9 % 25 mL IVPB ( Intravenous Stopped 06/18/19 1117)  sodium chloride 0.9 % bolus 1,000 mL ( Intravenous Stopped 06/18/19 1337)     Procedures  /  Critical Care .Critical Care Performed by: Maudie Flakes, MD Authorized by: Maudie Flakes, MD   Critical care provider statement:    Critical care time (minutes):  45   Critical care was necessary to treat or prevent imminent or life-threatening deterioration of the following conditions:  CNS failure or compromise (Status epilepticus)   Critical care was time spent personally by me on the following activities:  Discussions with consultants, evaluation of patient's response to treatment, examination of patient, ordering and performing treatments and interventions, ordering and review of laboratory studies, ordering and review of radiographic studies, pulse oximetry, re-evaluation of patient's condition, obtaining history from patient or surrogate and review of old charts    ED Course and Medical Decision Making  I have reviewed the triage vital signs, the nursing notes, and pertinent available records from the EMR.  Pertinent labs & imaging results that were available during my care of the patient were reviewed by me and considered in my medical decision making (see below for details).     Status epilepticus, reportedly very compliant with medications, no other events that could explain  this prolonged seizure activity, however daughter-in-law explains that she frequently has seizure activity lasting this long.  Good response to 2 mg of Ativan, not responding to voice and eyes crossing the midline to look to the left, still postictal, requiring 4 L nasal cannula at this time, will monitor closely.  Work-up for secondary cause of seizure is pending, including labs, urine, CT head.  Work-up is largely reassuring, lactate clearing with fluids, normal electrolytes, no signs of UTI, CT head is stable.  Patient continues to improve clinically but is not back to baseline.  Discussed case with Dr. Rory Percy of neurology.  Admission appropriate if patient not back to baseline.  Patient was given a total of 6 hours in the emergency department, continues to repeat phrases, not follow commands.  Per family, she is normal and alert and conversant at her baseline.  Accepted for admission by Dr. Lorin Mercy of hospital service.  Would consider discharge if patient returns to baseline and we are waiting for an extended period of time for transport given the bed situation at [facility].  Per neurology, if back to baseline she can be safely discharged, would advise increasing her Keppra to 1500 twice daily.  Currently taking 1000 in the morning and 1500 in the evening.  Barth Kirks. Sedonia Small, MD [place] [email or web address]  Final Clinical Impressions(s) / ED Diagnoses     ICD-10-CM   1. Seizure ([place])  R56.9     ED Discharge Orders    None       Discharge Instructions Discussed with and Provided to Patient:   Discharge Instructions   None       Maudie Flakes, MD 06/18/19 1521

## 2019-06-18 NOTE — ED Notes (Signed)
ED Provider at bedside. 

## 2019-06-19 ENCOUNTER — Observation Stay (HOSPITAL_COMMUNITY): Payer: Medicare Other

## 2019-06-19 ENCOUNTER — Ambulatory Visit: Payer: Medicare Other

## 2019-06-19 ENCOUNTER — Encounter (HOSPITAL_COMMUNITY): Payer: Self-pay | Admitting: *Deleted

## 2019-06-19 ENCOUNTER — Inpatient Hospital Stay (HOSPITAL_COMMUNITY): Payer: Medicare Other

## 2019-06-19 DIAGNOSIS — M1990 Unspecified osteoarthritis, unspecified site: Secondary | ICD-10-CM | POA: Diagnosis present

## 2019-06-19 DIAGNOSIS — G40101 Localization-related (focal) (partial) symptomatic epilepsy and epileptic syndromes with simple partial seizures, not intractable, with status epilepticus: Secondary | ICD-10-CM | POA: Diagnosis present

## 2019-06-19 DIAGNOSIS — D631 Anemia in chronic kidney disease: Secondary | ICD-10-CM | POA: Diagnosis present

## 2019-06-19 DIAGNOSIS — G40919 Epilepsy, unspecified, intractable, without status epilepticus: Secondary | ICD-10-CM

## 2019-06-19 DIAGNOSIS — I5032 Chronic diastolic (congestive) heart failure: Secondary | ICD-10-CM | POA: Diagnosis present

## 2019-06-19 DIAGNOSIS — E119 Type 2 diabetes mellitus without complications: Secondary | ICD-10-CM | POA: Diagnosis not present

## 2019-06-19 DIAGNOSIS — N189 Chronic kidney disease, unspecified: Secondary | ICD-10-CM | POA: Diagnosis not present

## 2019-06-19 DIAGNOSIS — G4733 Obstructive sleep apnea (adult) (pediatric): Secondary | ICD-10-CM | POA: Diagnosis present

## 2019-06-19 DIAGNOSIS — Z8673 Personal history of transient ischemic attack (TIA), and cerebral infarction without residual deficits: Secondary | ICD-10-CM | POA: Diagnosis not present

## 2019-06-19 DIAGNOSIS — R569 Unspecified convulsions: Secondary | ICD-10-CM

## 2019-06-19 DIAGNOSIS — G40001 Localization-related (focal) (partial) idiopathic epilepsy and epileptic syndromes with seizures of localized onset, not intractable, with status epilepticus: Secondary | ICD-10-CM

## 2019-06-19 DIAGNOSIS — Z823 Family history of stroke: Secondary | ICD-10-CM | POA: Diagnosis not present

## 2019-06-19 DIAGNOSIS — Z20822 Contact with and (suspected) exposure to covid-19: Secondary | ICD-10-CM | POA: Diagnosis present

## 2019-06-19 DIAGNOSIS — J449 Chronic obstructive pulmonary disease, unspecified: Secondary | ICD-10-CM | POA: Diagnosis present

## 2019-06-19 DIAGNOSIS — Z9119 Patient's noncompliance with other medical treatment and regimen: Secondary | ICD-10-CM | POA: Diagnosis not present

## 2019-06-19 DIAGNOSIS — N179 Acute kidney failure, unspecified: Secondary | ICD-10-CM | POA: Diagnosis present

## 2019-06-19 DIAGNOSIS — Z8711 Personal history of peptic ulcer disease: Secondary | ICD-10-CM | POA: Diagnosis not present

## 2019-06-19 DIAGNOSIS — I130 Hypertensive heart and chronic kidney disease with heart failure and stage 1 through stage 4 chronic kidney disease, or unspecified chronic kidney disease: Secondary | ICD-10-CM | POA: Diagnosis present

## 2019-06-19 DIAGNOSIS — K219 Gastro-esophageal reflux disease without esophagitis: Secondary | ICD-10-CM | POA: Diagnosis present

## 2019-06-19 DIAGNOSIS — E1122 Type 2 diabetes mellitus with diabetic chronic kidney disease: Secondary | ICD-10-CM | POA: Diagnosis present

## 2019-06-19 DIAGNOSIS — Z885 Allergy status to narcotic agent status: Secondary | ICD-10-CM | POA: Diagnosis not present

## 2019-06-19 DIAGNOSIS — Z91048 Other nonmedicinal substance allergy status: Secondary | ICD-10-CM | POA: Diagnosis not present

## 2019-06-19 DIAGNOSIS — E785 Hyperlipidemia, unspecified: Secondary | ICD-10-CM | POA: Diagnosis present

## 2019-06-19 DIAGNOSIS — N1831 Chronic kidney disease, stage 3a: Secondary | ICD-10-CM | POA: Diagnosis present

## 2019-06-19 DIAGNOSIS — Z8616 Personal history of COVID-19: Secondary | ICD-10-CM | POA: Diagnosis not present

## 2019-06-19 DIAGNOSIS — Z87891 Personal history of nicotine dependence: Secondary | ICD-10-CM | POA: Diagnosis not present

## 2019-06-19 LAB — CBC
HCT: 40 % (ref 36.0–46.0)
Hemoglobin: 13.3 g/dL (ref 12.0–15.0)
MCH: 30.2 pg (ref 26.0–34.0)
MCHC: 33.3 g/dL (ref 30.0–36.0)
MCV: 90.9 fL (ref 80.0–100.0)
Platelets: 188 10*3/uL (ref 150–400)
RBC: 4.4 MIL/uL (ref 3.87–5.11)
RDW: 15 % (ref 11.5–15.5)
WBC: 13.2 10*3/uL — ABNORMAL HIGH (ref 4.0–10.5)
nRBC: 0 % (ref 0.0–0.2)

## 2019-06-19 LAB — COMPREHENSIVE METABOLIC PANEL
ALT: 87 U/L — ABNORMAL HIGH (ref 0–44)
AST: 94 U/L — ABNORMAL HIGH (ref 15–41)
Albumin: 3.4 g/dL — ABNORMAL LOW (ref 3.5–5.0)
Alkaline Phosphatase: 82 U/L (ref 38–126)
Anion gap: 9 (ref 5–15)
BUN: 9 mg/dL (ref 8–23)
CO2: 22 mmol/L (ref 22–32)
Calcium: 10.1 mg/dL (ref 8.9–10.3)
Chloride: 110 mmol/L (ref 98–111)
Creatinine, Ser: 1.06 mg/dL — ABNORMAL HIGH (ref 0.44–1.00)
GFR calc Af Amer: 59 mL/min — ABNORMAL LOW (ref >60)
GFR calc non Af Amer: 51 mL/min — ABNORMAL LOW (ref >60)
Glucose, Bld: 102 mg/dL — ABNORMAL HIGH (ref 70–99)
Potassium: 4.3 mmol/L (ref 3.5–5.1)
Sodium: 141 mmol/L (ref 135–145)
Total Bilirubin: 0.6 mg/dL (ref 0.3–1.2)
Total Protein: 7.8 g/dL (ref 6.5–8.1)

## 2019-06-19 LAB — GLUCOSE, CAPILLARY
Glucose-Capillary: 100 mg/dL — ABNORMAL HIGH (ref 70–99)
Glucose-Capillary: 110 mg/dL — ABNORMAL HIGH (ref 70–99)
Glucose-Capillary: 124 mg/dL — ABNORMAL HIGH (ref 70–99)
Glucose-Capillary: 129 mg/dL — ABNORMAL HIGH (ref 70–99)
Glucose-Capillary: 73 mg/dL (ref 70–99)
Glucose-Capillary: 84 mg/dL (ref 70–99)
Glucose-Capillary: 91 mg/dL (ref 70–99)
Glucose-Capillary: 97 mg/dL (ref 70–99)

## 2019-06-19 LAB — BLOOD GAS, VENOUS
Acid-base deficit: 1.8 mmol/L (ref 0.0–2.0)
Bicarbonate: 22.5 mmol/L (ref 20.0–28.0)
FIO2: 21
O2 Saturation: 49.7 %
Patient temperature: 37
pCO2, Ven: 38.5 mmHg — ABNORMAL LOW (ref 44.0–60.0)
pH, Ven: 7.384 (ref 7.250–7.430)

## 2019-06-19 LAB — TSH: TSH: 0.358 u[IU]/mL (ref 0.350–4.500)

## 2019-06-19 LAB — PHOSPHORUS: Phosphorus: 2.2 mg/dL — ABNORMAL LOW (ref 2.5–4.6)

## 2019-06-19 LAB — HEMOGLOBIN A1C
Hgb A1c MFr Bld: 6.1 % — ABNORMAL HIGH (ref 4.8–5.6)
Mean Plasma Glucose: 128.37 mg/dL

## 2019-06-19 LAB — MAGNESIUM: Magnesium: 2 mg/dL (ref 1.7–2.4)

## 2019-06-19 MED ORDER — DICLOFENAC SODIUM 1 % EX GEL
2.0000 g | Freq: Four times a day (QID) | CUTANEOUS | Status: DC
  Administered 2019-06-19 – 2019-06-21 (×8): 2 g via TOPICAL
  Filled 2019-06-19: qty 100

## 2019-06-19 MED ORDER — LACOSAMIDE 50 MG PO TABS
225.0000 mg | ORAL_TABLET | Freq: Every morning | ORAL | Status: DC
  Administered 2019-06-20 – 2019-06-21 (×2): 225 mg via ORAL
  Filled 2019-06-19: qty 1

## 2019-06-19 MED ORDER — PANTOPRAZOLE SODIUM 40 MG IV SOLR
40.0000 mg | Freq: Two times a day (BID) | INTRAVENOUS | Status: DC
  Administered 2019-06-19 – 2019-06-20 (×3): 40 mg via INTRAVENOUS
  Filled 2019-06-19 (×3): qty 40

## 2019-06-19 MED ORDER — LEVETIRACETAM 500 MG PO TABS
1000.0000 mg | ORAL_TABLET | Freq: Every day | ORAL | Status: DC
  Administered 2019-06-19 – 2019-06-20 (×2): 1000 mg via ORAL
  Filled 2019-06-19 (×2): qty 2

## 2019-06-19 MED ORDER — LACOSAMIDE 50 MG PO TABS
300.0000 mg | ORAL_TABLET | Freq: Every day | ORAL | Status: DC
  Administered 2019-06-19 – 2019-06-20 (×2): 300 mg via ORAL
  Filled 2019-06-19 (×3): qty 2

## 2019-06-19 MED ORDER — LORAZEPAM 2 MG/ML IJ SOLN
2.0000 mg | Freq: Once | INTRAMUSCULAR | Status: AC
  Administered 2019-06-19: 2 mg via INTRAVENOUS
  Filled 2019-06-19: qty 1

## 2019-06-19 MED ORDER — LACOSAMIDE 50 MG PO TABS: 300.0000 mg | ORAL_TABLET | Freq: Two times a day (BID) | ORAL | Status: DC

## 2019-06-19 MED ORDER — LEVETIRACETAM 750 MG PO TABS
1500.0000 mg | ORAL_TABLET | Freq: Every morning | ORAL | Status: DC
  Administered 2019-06-20 – 2019-06-21 (×2): 1500 mg via ORAL
  Filled 2019-06-19 (×2): qty 2

## 2019-06-19 NOTE — Consult Note (Signed)
Requesting Physician: Dr. Roel Cluck    Chief Complaint: Seizures  History obtained from: Patient and Chart    HPI:                                                                                                                                       Michelle Farrell is a 79 y.o. female past medical history of epilepsy on Keppra and Vimpat, stroke, hypertension, COPD, CKD, congestive heart failure presents to the emergency department at [facility] after prolonged seizure witnessed by her daughter at home.  History obtained by chart review.  According to the daughter, patient had typical seizure activity this morning started by staring off into space.  Seizures then proceeded to having right arm shaking and gaze preference occurring intermittently requiring her to be brought to the emergency department.  Total time of seizure activity was 45 minutes with no return to baseline in between.  In the emergency room patient did not have forced gaze deviation and with eyes crossing midline to look to the left. Patient received 2 mg of Ativan.  Patient was still not responding and therefore admitted for prolonged postictal state and therefore admitted to hospital service.  Patient transferred to [facility] for further monitoring.  Patient remained encephalopathic, not following commands and not speaking and therefore neurology was consulted.  Patient just received IV Keppra and 200 mg of IV Vimpat.   Past Medical History:  Diagnosis Date  . AKI (acute kidney injury) ([place])   . Arthritis   . Back pain, chronic   . CHF (congestive heart failure) ([place])   . CKD (chronic kidney disease)   . COPD (chronic obstructive pulmonary disease) ([place])   . Crohn disease ([place])   . Diabetes mellitus without complication ([place])   . Esophageal stricture    s/p dilation 2003, 2008  . GERD (gastroesophageal reflux disease)   . Hip pain, chronic   . Hypertension   . PUD (peptic ulcer disease)   . Seizures ([place])    . Sleep apnea   . Stroke [facility])     Past Surgical History:  Procedure Laterality Date  . ABDOMINAL HYSTERECTOMY    . BLADDER SURGERY    . CHOLECYSTECTOMY    . ESOPHAGOGASTRODUODENOSCOPY N/A 07/08/2014   Procedure: ESOPHAGOGASTRODUODENOSCOPY (EGD);  Surgeon: Ladene Artist, MD;  Location: [facility] ENDOSCOPY;  Service: Endoscopy;  Laterality: N/A;  . SKIN CANCER EXCISION    . TONSILLECTOMY      Family History  Problem Relation Age of Onset  . Ovarian cancer Maternal Grandmother   . Stroke Mother   . Stroke Brother    Social History:  reports that she quit smoking about 5 years ago. Her smoking use included cigarettes. She has a 30.00 pack-year smoking history. She has never used smokeless tobacco. She reports that she does not drink alcohol or use drugs.  Allergies:  Allergies  Allergen  Reactions  . Tape Other (See Comments)    NO PLASTIC TAPE- Bubbles the skin!!  . Tramadol Other (See Comments)    Seizures     Medications:                                                                                                                        I reviewed home medications   ROS:                                                                                                                                     14 systems reviewed and negative except above   Examination:                                                                                                      General: Appears well-developed and well-nourished.  Psych: Affect appropriate to situation Eyes: No scleral injection HENT: No OP obstrucion Head: Normocephalic.  Cardiovascular: Normal rate and regular rhythm.  Respiratory: Effort normal and breath sounds normal to anterior ascultation GI: Soft.  No distension. There is no tenderness.  Skin: WDI    Neurological Examination Mental Status: Alert, not following any commands.  When questioned status patient is speaking gibberish/word salad with incomprehensible  words. Cranial Nerves: II: Visual fields: Blinks to threat bilaterally III,IV, VI: ptosis not present, no gaze deviation, pupils equal, round, reactive to light and accommodation VII: smile symmetric VIII: Unable to assess IX,X: Unable to assess XI: Unable to assess XII: midline tongue extension Motor: Moves all 4 extremities against gravity Tone and bulk:normal tone throughout; no atrophy noted Sensory: Withdraws to noxious stimulus to bilateral stimulation Deep Tendon Reflexes: 2+ and symmetric throughout Plantars: Right: downgoing   Left: downgoing Cerebellar: normal finger-to-nose, and normal heel-to-shin test      Lab Results: Basic Metabolic Panel: Recent Labs  Lab 06/18/19 0852  NA 139  K 4.1  CL 108  CO2 18*  GLUCOSE 206*  BUN 19  CREATININE  1.27*  CALCIUM 10.0    CBC: Recent Labs  Lab 06/18/19 0852  WBC 10.3  HGB 13.8  HCT 41.8  MCV 92.1  PLT 218    Coagulation Studies: No results for input(s): LABPROT, INR in the last 72 hours.  Imaging: CT HEAD WO CONTRAST  Result Date: 06/18/2019 CLINICAL DATA:  Status epilepticus. EXAM: CT HEAD WITHOUT CONTRAST TECHNIQUE: Contiguous axial images were obtained from the base of the skull through the vertex without intravenous contrast. COMPARISON:  03/27/2019 head CT. FINDINGS: Brain: No evidence of parenchymal hemorrhage or extra-axial fluid collection. No mass lesion, mass effect, or midline shift. No CT evidence of acute infarction. Nonspecific moderate subcortical and periventricular white matter hypodensity, most in keeping with chronic small vessel ischemic change. Chronic mild anterior right temporal lobe encephalomalacia. No ventriculomegaly. Vascular: No acute abnormality. Skull: No evidence of calvarial fracture. Sinuses/Orbits: No fluid levels. Mild mucoperiosteal thickening in the right maxillary sinus. Other:  The mastoid air cells are unopacified. IMPRESSION: 1. No evidence of acute intracranial  abnormality. No evidence of calvarial fracture. 2. Chronic mild anterior right temporal lobe encephalomalacia. 3. Moderate chronic small vessel ischemic changes in the cerebral white matter. Electronically Signed   By: Ilona Sorrel M.D.   On: 06/18/2019 09:43   DG Chest Port 1 View  Result Date: 06/18/2019 CLINICAL DATA:  Aspiration during seizure. EXAM: PORTABLE CHEST 1 VIEW COMPARISON:  March 30, 2019. FINDINGS: The heart size and mediastinal contours are within normal limits. Both lungs are clear. No pneumothorax or pleural effusion is noted. The visualized skeletal structures are unremarkable. IMPRESSION: No acute cardiopulmonary abnormality seen. Aortic Atherosclerosis (ICD10-I70.0). Electronically Signed   By: Marijo Conception M.D.   On: 06/18/2019 09:36     I have reviewed the above imaging    ASSESSMENT AND PLAN  79 y.o. female past medical history of epilepsy on Keppra and Vimpat, stroke, hypertension, COPD, CKD, congestive heart failure presents to the emergency department at [facility] after prolonged seizure witnessed by her daughter at home.  Patient still not back to baseline >12 hours, still aphasic.  It appears prior seizures may have originated from the left hemisphere, I am concerned patient may still have be having partial seizures.  We will give trial of Ativan 2 mg to see if this improves her exam.  Will obtain EEG.  Seizure with prolonged post-ictal state  Recommendations Stat EEG : to assess for subclinical status epilepticus as patient still aphasic MRI brain repeat after EEG leads removed Continue Keppra and Vimpat, will add AED if needed Seizure precautions   Avaline Stillson [facility] Pager Number [phone]

## 2019-06-19 NOTE — Evaluation (Signed)
Clinical/Bedside Swallow Evaluation Patient Details  Name: Michelle Farrell MRN: [id number] Date of Birth: [birth year]  Today's Date: 06/19/2019 Time: SLP Start Time (ACUTE ONLY): 1001 SLP Stop Time (ACUTE ONLY): 1035 SLP Time Calculation (min) (ACUTE ONLY): 34 min  Past Medical History:  Past Medical History:  Diagnosis Date  . AKI (acute kidney injury) ([place])   . Arthritis   . Back pain, chronic   . CHF (congestive heart failure) ([place])   . CKD (chronic kidney disease)   . COPD (chronic obstructive pulmonary disease) ([place])   . Crohn disease ([place])   . Diabetes mellitus without complication ([place])   . Esophageal stricture    s/p dilation 2003, 2008  . GERD (gastroesophageal reflux disease)   . Hip pain, chronic   . Hypertension   . PUD (peptic ulcer disease)   . Seizures ([place])   . Sleep apnea   . Stroke [facility])    Past Surgical History:  Past Surgical History:  Procedure Laterality Date  . ABDOMINAL HYSTERECTOMY    . BLADDER SURGERY    . CHOLECYSTECTOMY    . ESOPHAGOGASTRODUODENOSCOPY N/A 07/08/2014   Procedure: ESOPHAGOGASTRODUODENOSCOPY (EGD);  Surgeon: Ladene Artist, MD;  Location: [facility] ENDOSCOPY;  Service: Endoscopy;  Laterality: N/A;  . SKIN CANCER EXCISION    . TONSILLECTOMY     HPI:  79 yo female adm to [facility] with seizure type of activity.  PMH + for seizures, CVA, Crohn's, COVID 82 in 02/2019, COPD, CHF.  Swallow eval ordered.  Pt with prolonged post-ictical state and neuro consult completed.  She has been undergoing EEG.  CXR negative for acute change.  Pt admits to h/o coughing sometimes with intake - food more than drink reported by her and her granddaughter *[name]* via telephone.  Pt does not have dentures at hospital but granddaughter bringing them as she normally uses them when she eats/drinks.  She is also s/p dilatation 2003 and 2008.  Pt had endoscopy in 06/2014 and was found to have mild erosive gastritis, 5 cm HH and esophagitis and was advised to take omeprazole 40  BID long term.  Per her granddaughter she has not been on any reflux medication for "years" - since 2017 likely. Granddtr reports pt with more coughing with intake since her stroke - also ? some reflux component however.   Assessment / Plan / Recommendation Clinical Impression  Pt presents with mild oral dysphagia likely due to lack of dentition in addition to decreased sensation to right lower face resulting in minimal retention.  Pt able to feed self with her rigth hand. NO indications of aspiration with po intake including peaches, pudding, coffee and water.  Minimal right lateral sulci pocketing noted whch pt cleared with further po.  SLP spoke to her granddaughter and she advised pt with some coughing with intake since 2016, however she is not taking a PPI as was advised by Dr Fuller Plan (40 mg omeprazole BID).  Recommend when dentures arrive she consume dys3/thin diet.  SLP will follow up x1 to assure po tolerance.  Also note pt with expressive aphasia with word finding, ? if exacerbated from prior cva or baseline.  Pt and granddaugther agreeable to plan. SLP Visit Diagnosis: Dysphagia, oral phase (R13.11)    Aspiration Risk  Mild aspiration risk    Diet Recommendation Dysphagia 3 (Mech soft);Thin liquid   Liquid Administration via: Cup;Straw Medication Administration: Whole meds with puree Supervision: Patient able to self feed Compensations: Slow rate;Small sips/bites    Other  Recommendations  Oral Care Recommendations: Oral care BID   Follow up Recommendations Other (comment)(TBD)      Frequency and Duration min 1 x/week  1 week       Prognosis Prognosis for Safe Diet Advancement: Good      Swallow Study   General Date of Onset: 06/19/19 HPI: 79 yo female adm to [facility] with seizure type of activity.  PMH + for seizures, CVA, Crohn's, COVID 67 in 02/2019, COPD, CHF.  Swallow eval ordered.  Pt with prolonged post-ictical state and neuro consult completed.  She has been undergoing EEG.   CXR negative for acute change.  Pt admits to h/o coughing sometimes with intake - food more than drink reported by her and her granddaughter *[name]* via telephone.  Pt does not have dentures at hospital but granddaughter bringing them as she normally uses them when she eats/drinks.  She is also s/p dilatation 2003 and 2008.  Pt had endoscopy in 06/2014 and was found to have mild erosive gastritis, 5 cm HH and esophagitis and was advised to take omeprazole 40 BID long term.  Per her granddaughter she has not been on any reflux medication for "years" - since 2017 likely. Granddtr reports pt with more coughing with intake since her stroke - also ? some reflux component however. Previous Swallow Assessment: 2016 last diet was regular/thin - pt with poor dentition at that time resulting in prolonged oral manipulation Diet Prior to this Study: Thin liquids(clear liquids) Temperature Spikes Noted: No(100.1) Respiratory Status: Nasal cannula Behavior/Cognition: Alert Oral Cavity Assessment: Within Functional Limits Oral Care Completed by SLP: No Oral Cavity - Dentition: Edentulous(grnddtr bringing dentures) Vision: Functional for self-feeding Self-Feeding Abilities: Able to feed self Patient Positioning: Upright in bed Baseline Vocal Quality: Normal Volitional Cough: Weak Volitional Swallow: Able to elicit    Oral/Motor/Sensory Function Overall Oral Motor/Sensory Function: Mild impairment(slight facial asymmetry- right)   Ice Chips Ice chips: Not tested   Thin Liquid Thin Liquid: Within functional limits Presentation: Self Fed;Straw    Nectar Thick Nectar Thick Liquid: Not tested   Honey Thick Honey Thick Liquid: Not tested   Puree Puree: Impaired Presentation: Self Fed;Spoon Oral Phase Functional Implications: Oral residue;Right lateral sulci pocketing   Solid     Solid: Impaired Presentation: Self Fed Oral Phase Functional Implications: Impaired mastication;Right lateral sulci  pocketing Other Comments: prolonged mastication, minimal pocketing on right      Macario Golds 06/19/2019,10:58 AM  Kathleen Lime, MS [place] Office [phone]

## 2019-06-19 NOTE — Procedures (Addendum)
Patient Name: Michelle Farrell  MRN: [id number]  Epilepsy Attending: Lora Havens  Referring Physician/Provider: Dr Karena Addison Aroor Duration: 06/19/2019 0404 to 06/19/2019 1228  Patient history: 79 year old female with past medical history of epilepsy who presented with breakthrough seizures.  EEG evaluate for subclinical seizures.  Level of alertness: Awake, sleep  AEDs during EEG study: Keppra, Vimpat, Ativan  Technical aspects: This EEG study was done with scalp electrodes positioned according to the 10-20 International system of electrode placement. Electrical activity was acquired at a sampling rate of 500Hz  and reviewed with a high frequency filter of 70Hz  and a low frequency filter of 1Hz . EEG data were recorded continuously and digitally stored.   Description:  The posterior dominant rhythm consists of 7 Hz activity of moderate voltage (25-35 uV) seen predominantly in posterior head regions, symmetric and reactive to eye opening and eye closing.  Sleep was characterized by vertex waves, sleep spindles (13 to 15 Hz), maximal frontocentral.  Left temporal sharp waves (maximal T7 more than F7 more than T7) were also noted.  Hyperventilation photic stimulation were not performed.   Abnormality -Background slow, generalized -Sharp waves, left temporal region      IMPRESSION: This study showed evidence of epileptogenicity in the left temporal region as well as mild diffuse encephalopathy, nonspecific etiology. No seizures were seen throughout the recording.    Katheryn Culliton Barbra Sarks

## 2019-06-19 NOTE — Evaluation (Addendum)
Physical Therapy Evaluation Patient Details Name: Michelle Farrell MRN: [id number] DOB: [birth year] Today's Date: 06/19/2019   History of Present Illness  Pt is a 79 y.o. F with significant PMH of diabetes mellitus, HTN, CHF, Crohn's disease, tobacco abuse, and seizure who presents with seizure like activity and prolonged post ictal state. Given IV Keppra and IV Vimpat. EEG in progress.  Clinical Impression  Prior to admission, pt lives with her granddaughter and has a daily PCA, uses a Rollator for mobility, and requires assist for some ADL's. On PT evaluation, pt presents with decreased functional mobility secondary to decreased cognition, balance impairments, decreased activity tolerance and gait abnormalities. Ambulating 12 feet with a walker at a mod assist level; displays retropulsion which pt granddaughter states is baseline. Further mobility deferred as pt on EEG. Pt granddaughter would like to take her home if possible with 24/7 assist and continued HHPT services. Will continue to progress mobility as tolerated.    Follow Up Recommendations Home health PT;Supervision/Assistance - 24 hour    Equipment Recommendations  Wheelchair   Recommendations for Other Services       Precautions / Restrictions Precautions Precautions: Fall Restrictions Weight Bearing Restrictions: No      Mobility  Bed Mobility Overal bed mobility: Needs Assistance Bed Mobility: Supine to Sit;Sit to Supine     Supine to sit: Min guard Sit to supine: Min guard   General bed mobility comments: Pt able to complete supine <> sit without physical assist, min guard for safety. Step by step cues for execution of task.  Transfers Overall transfer level: Needs assistance Equipment used: Rolling walker (2 wheeled);None Transfers: Sit to/from Stand Sit to Stand: Min assist         General transfer comment: MinA to stand from edge of bed, initial retropulsion noted  Ambulation/Gait Ambulation/Gait  assistance: Mod assist Gait Distance (Feet): 12 Feet Assistive device: Rolling walker (2 wheeled) Gait Pattern/deviations: Step-through pattern;Decreased stride length;Narrow base of support;Leaning posteriorly Gait velocity: decreased Gait velocity interpretation: <1.8 ft/sec, indicate of risk for recurrent falls General Gait Details: Pt requiring modA for stability due to retropulsion and right lateral lean at sink. cues for keeping walker on ground, maintaining hand positioning on handles, walker proximity.  Stairs            Wheelchair Mobility    Modified Rankin (Stroke Patients Only)       Balance Overall balance assessment: Needs assistance Sitting-balance support: Feet supported Sitting balance-Leahy Scale: Poor Sitting balance - Comments: requiring intermittent min-modA due to right lateral lean Postural control: Right lateral lean Standing balance support: No upper extremity supported;During functional activity Standing balance-Leahy Scale: Poor Standing balance comment: Requiring external assist at sink when washing hands                              Pertinent Vitals/Pain Pain Assessment: Faces Faces Pain Scale: Hurts little more Pain Location: right neck, shoulder and left leg *left leg itching Pain Descriptors / Indicators: Grimacing;Sore Pain Intervention(s): Limited activity within patient's tolerance;Monitored during session;Premedicated before session    [place] expects to be discharged to:: Private residence Living Arrangements: Other (Comment)(grandchildren) Available Help at Discharge: Family;Personal care attendant;Available 24 hours/day Type of Home: House Home Access: Stairs to enter Entrance Stairs-Rails: Psychiatric nurse of Steps: 4 Home Layout: Able to live on main level with bedroom/bathroom Home Equipment: Walker - 4 wheels      Prior Function Level of  Independence: Needs assistance    Gait / Transfers Assistance Needed: uses Rollator  ADL's / Homemaking Assistance Needed: can bird bath and dress independently, requires supervision/assist for showers  Comments: No falls in recent months     Hand Dominance        Extremity/Trunk Assessment   Upper Extremity Assessment Upper Extremity Assessment: Defer to OT evaluation    Lower Extremity Assessment Lower Extremity Assessment: Generalized weakness    Cervical / Trunk Assessment Cervical / Trunk Assessment: Kyphotic  Communication   Communication: No difficulties  Cognition Arousal/Alertness: Awake/alert Behavior During Therapy: Flat affect Overall Cognitive Status: Impaired/Different from baseline Area of Impairment: Orientation;Attention;Awareness;Problem solving                 Orientation Level: Disoriented to;Time;Situation Current Attention Level: Focused;Sustained   Following Commands: Follows one step commands with increased time   Awareness: Intellectual Problem Solving: Slow processing;Decreased initiation General Comments: Pt able to follow 1 step commands with repetition and increased time; slow psychomotor processing noted. Pt oriented to self, hospital; when asked what city she was in, pt stating "[place]," and when asked why she was here, pt stating, "strokes." Pt also with some noted difficulty with functional tasks i.e. washing hands at sink, requiring cues for completion of task.      General Comments      Exercises     Assessment/Plan    PT Assessment Patient needs continued PT services  PT Problem List Decreased strength;Decreased activity tolerance;Decreased balance;Decreased mobility;Decreased cognition;Decreased safety awareness       PT Treatment Interventions DME instruction;Gait training;Stair training;Functional mobility training;Therapeutic activities;Therapeutic exercise;Balance training;Patient/family education    PT Goals (Current goals can be found  in the Care Plan section)  Acute Rehab PT Goals Patient Stated Goal: pt granddaughter would like her to get out of bed and mobilize PT Goal Formulation: With patient/family Time For Goal Achievement: 07/03/19 Potential to Achieve Goals: Good    Frequency Min 3X/week   Barriers to discharge        Co-evaluation PT/OT/SLP Co-Evaluation/Treatment: Yes Reason for Co-Treatment: For patient/therapist safety;To address functional/ADL transfers;Necessary to address cognition/behavior during functional activity PT goals addressed during session: Mobility/safety with mobility         AM-PAC PT "6 Clicks" Mobility  Outcome Measure Help needed turning from your back to your side while in a flat bed without using bedrails?: A Little Help needed moving from lying on your back to sitting on the side of a flat bed without using bedrails?: A Little Help needed moving to and from a bed to a chair (including a wheelchair)?: A Little Help needed standing up from a chair using your arms (e.g., wheelchair or bedside chair)?: A Little Help needed to walk in hospital room?: A Lot Help needed climbing 3-5 steps with a railing? : A Lot 6 Click Score: 16    End of Session Equipment Utilized During Treatment: Gait belt Activity Tolerance: Patient tolerated treatment well Patient left: in bed;with call bell/phone within reach;with bed alarm set Nurse Communication: Mobility status PT Visit Diagnosis: Unsteadiness on feet (R26.81);Difficulty in walking, not elsewhere classified (R26.2)    Time: 1033-1100 PT Time Calculation (min) (ACUTE ONLY): 27 min   Charges:   PT Evaluation $PT Eval Moderate Complexity: Brum[place], PT, DPT Acute Rehabilitation Services Pager [phone] Office [phone]   Deno Etienne 06/19/2019, 1:00 PM

## 2019-06-19 NOTE — Progress Notes (Addendum)
Subjective: No seizures on EEG.  Patient appears to be improving.  Complains of mild pain in the right arm but states has been going on for few days, unable to pinpoint or localize it.  Granddaughter at bedside states patient was having trouble sleeping and anxious about possibly going to a nursing home on Monday.  Denies missing any doses of AEDs.  Denies any side effects on current doses of AEDs.  States last breakthrough seizure was in December 2019 in setting of Covid.  Before that was seizure-free for about a year after increase in AED doses.  ROS: negative except above  Examination  Vital signs in last 24 hours: Temp:  [98.7 F (37.1 C)-100.1 F (37.8 C)] 98.7 F (37.1 C) (03/24 0726) Pulse Rate:  [52-74] 64 (03/24 0726) Resp:  [11-31] 20 (03/24 0726) BP: (128-186)/(44-92) 158/87 (03/24 0726) SpO2:  [95 %-100 %] 100 % (03/24 0726)  General: lying in bed, not in apparent distress CVS: pulse-normal rate and rhythm RS: breathing comfortably, CTA B Extremities: normal, warm  Neuro: MS: Alert, oriented to self, able to say she was in the hospital when she was given options, follows commands CN: pupils equal and reactive,  EOMI, face symmetric, tongue midline, normal sensation over face Motor: 5/5 strength in all 4 extremities  Basic Metabolic Panel: Recent Labs  Lab 06/18/19 0852 06/19/19 0333  NA 139 141  K 4.1 4.3  CL 108 110  CO2 18* 22  GLUCOSE 206* 102*  BUN 19 9  CREATININE 1.27* 1.06*  CALCIUM 10.0 10.1  MG  --  2.0  PHOS  --  2.2*    CBC: Recent Labs  Lab 06/18/19 0852 06/19/19 0333  WBC 10.3 13.2*  HGB 13.8 13.3  HCT 41.8 40.0  MCV 92.1 90.9  PLT 218 188    Coagulation Studies: No results for input(s): LABPROT, INR in the last 72 hours.  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  Focal epilepsy with breakthrough  seizures Acute encephalopathy, postictal (improved) AKI (improving) Hypoalbuminemia Transaminitis Hyperglycemia Leukocytosis - LTM EEG showed sharp waves in left temporal region, no clear seizure seen. - UA did not show any infectious etiology, mild leukocytosis likely secondary to seizure, AED levels were not drawn on admission.  Etiology for breakthrough seizures: Sleep deprivation, anxiety.  It is early to say but if patient has further breakthrough seizures then she could also have medically refractory epilepsy    Recommendations -Discontinue LTM EEG as no intermittent seizures seen and patient already improving. -Discussed my plan to continue home dose of AEDs versus increase Vimpat to 300 mg twice daily with granddaughter.  -As this was patient's first possible breakthrough seizure and per granddaughter she was sleep deprived and anxious, granddaughter would like to hold off on increasing medications at this point.  -AED doses confirmed with granddaughter at bedside.  Continue Keppra 1500 mg every morning, 1000 mg nightly, Vimpat 225 mg every morning and 300 mg nightly. -If patient has any further breakthrough seizures, discussed with granddaughter and recommended increasing Vimpat to 300 mg twice daily. -Per granddaughter, patient also has follow-up scheduled with patient's primary epileptologist Dr. Matilde Bash this Friday. -Discussed seizure precautions, seizure provoking factors including sleep deprivation, medication compliance with granddaughter -MRI brain ordered and pending to rule out stroke although low suspicion at this point as patient already improving. -If MRI brain does not show any  acute abnormalities, patient will be ready for discharge from neurology standpoint.  Thank you for allowing [place] to participate in the care of this patient.  Please page neuro hospitalist for any further questions after 5 PM.  Nnenna Meador Barbra Sarks

## 2019-06-19 NOTE — Evaluation (Signed)
Occupational Therapy Evaluation Patient Details Name: Michelle Farrell MRN: [id number] DOB: [birth year] Today's Date: 06/19/2019    History of Present Illness Pt is a 79 y.o. F with significant PMH of diabetes mellitus, HTN, CHF, Crohn's disease, tobacco abuse, and seizure who presents with seizure like activity and prolonged post ictal state. Given IV Keppra and IV Vimpat. EEG in progress.   Clinical Impression   PT admitted with seizure. Pt currently with functional limitiations due to the deficits listed below (see OT problem list). Pt with no awareness to reason for admission and insist it was due to a stroke. Pt with multiple questions able to provide hospital in [place]. Pt demonstrates balance and cognitive deficits.  Pt will benefit from skilled OT to increase their independence and safety with adls and balance to allow discharge SNF vs home. If family can provide 24/7 (A). Pt likely to return home as she reports having aides all the time.     Follow Up Recommendations  SNF;Home health OT(if family can provide 24/7 Home)    Equipment Recommendations  Wheelchair (measurements OT);Wheelchair cushion (measurements OT)    Recommendations for Other Services       Precautions / Restrictions Precautions Precautions: Fall Restrictions Weight Bearing Restrictions: No      Mobility Bed Mobility Overal bed mobility: Needs Assistance Bed Mobility: Supine to Sit;Sit to Supine     Supine to sit: Min guard Sit to supine: Min guard   General bed mobility comments: Pt able to complete supine <> sit without physical assist, min guard for safety. Step by step cues for execution of task.  Transfers Overall transfer level: Needs assistance Equipment used: Rolling walker (2 wheeled);None Transfers: Sit to/from Stand Sit to Stand: Min assist         General transfer comment: MinA to stand from edge of bed, initial retropulsion noted    Balance Overall balance assessment: Needs  assistance Sitting-balance support: Feet supported Sitting balance-Leahy Scale: Poor Sitting balance - Comments: requiring intermittent min-modA due to right lateral lean Postural control: Right lateral lean Standing balance support: No upper extremity supported;During functional activity Standing balance-Leahy Scale: Poor Standing balance comment: Requiring external assist at sink when washing hands                            ADL either performed or assessed with clinical judgement   ADL Overall ADL's : Needs assistance/impaired     Grooming: Wash/dry hands;Moderate assistance;Standing Grooming Details (indicate cue type and reason): difficulty with sequence and static standing. pt with posterior leana nd holding sink surface if allowed to self correct             Lower Body Dressing: Moderate assistance;Bed level Lower Body Dressing Details (indicate cue type and reason): pt able to don socks with extended time and with figure 4 position supine. pt needs multiple attempts to hold socks with hands             Functional mobility during ADLs: Rolling walker;Moderate assistance General ADL Comments: pt requires mod (A) with movement in RW but min to static stand     Vision         Perception     Praxis      Pertinent Vitals/Pain Pain Assessment: Faces Faces Pain Scale: Hurts little more Pain Location: right neck, shoulder and left leg *left leg itching Pain Descriptors / Indicators: Grimacing;Sore Pain Intervention(s): Limited activity within patient's tolerance;Monitored during session;Repositioned  Hand Dominance Right   Extremity/Trunk Assessment Upper Extremity Assessment Upper Extremity Assessment: Overall WFL for tasks assessed   Lower Extremity Assessment Lower Extremity Assessment: Defer to PT evaluation   Cervical / Trunk Assessment Cervical / Trunk Assessment: Kyphotic   Communication Communication Communication: No difficulties    Cognition Arousal/Alertness: Awake/alert Behavior During Therapy: Flat affect Overall Cognitive Status: Impaired/Different from baseline Area of Impairment: Orientation;Attention;Awareness;Problem solving                 Orientation Level: Disoriented to;Time;Situation Current Attention Level: Focused;Sustained   Following Commands: Follows one step commands with increased time   Awareness: Intellectual Problem Solving: Slow processing;Decreased initiation General Comments: Pt able to follow 1 step commands with repetition and increased time; slow psychomotor processing noted. Pt oriented to self, hospital; when asked what city she was in, pt stating "[place]," and when asked why she was here, pt stating, "strokes." Pt also with some noted difficulty with functional tasks i.e. washing hands at sink, requiring cues for completion of task.   General Comments       Exercises     Shoulder Instructions      Home Living Family/patient expects to be discharged to:: Private residence Living Arrangements: Other (Comment)(grandchildren) Available Help at Discharge: Family;Personal care attendant;Available 24 hours/day Type of Home: House Home Access: Stairs to enter [employer] of Steps: 4 Entrance Stairs-Rails: Right;Left Home Layout: Able to live on main level with bedroom/bathroom     Bathroom Shower/Tub: Tub/shower unit         Home Equipment: Environmental consultant - 4 wheels          Prior Functioning/Environment Level of Independence: Needs assistance  Gait / Transfers Assistance Needed: uses Rollator ADL's / Homemaking Assistance Needed: can bird bath and dress independently, requires supervision/assist for showers   Comments: No falls in recent months        OT Problem List: Decreased strength;Decreased activity tolerance;Impaired balance (sitting and/or standing);Decreased cognition;Decreased safety awareness;Decreased knowledge of use of DME or  AE;Decreased knowledge of precautions      OT Treatment/Interventions: Self-care/ADL training;Therapeutic exercise;Neuromuscular education;Energy conservation;DME and/or AE instruction;Manual therapy;Therapeutic activities;Cognitive remediation/compensation;Patient/family education;Balance training    OT Goals(Current goals can be found in the care plan section) Acute Rehab OT Goals Patient Stated Goal: pt granddaughter would like her to get out of bed and mobilize OT Goal Formulation: Patient unable to participate in goal setting Time For Goal Achievement: 07/03/19 Potential to Achieve Goals: Good  OT Frequency: Min 2X/week   Barriers to D/C:            Co-evaluation PT/OT/SLP Co-Evaluation/Treatment: Yes Reason for Co-Treatment: For patient/therapist safety;To address functional/ADL transfers   OT goals addressed during session: ADL's and self-care;Strengthening/ROM      AM-PAC OT "6 Clicks" Daily Activity     Outcome Measure Help from another person eating meals?: A Lot Help from another person taking care of personal grooming?: A Lot Help from another person toileting, which includes using toliet, bedpan, or urinal?: A Lot Help from another person bathing (including washing, rinsing, drying)?: A Lot Help from another person to put on and taking off regular upper body clothing?: A Lot Help from another person to put on and taking off regular lower body clothing?: A Lot 6 Click Score: 12   End of Session Equipment Utilized During Treatment: Gait belt;Rolling walker Nurse Communication: Mobility status;Precautions  Activity Tolerance: Patient tolerated treatment well Patient left: in bed;with call bell/phone within reach;with bed alarm set;with nursing/sitter  in room(per pt request and due to EEG leads still attached)  OT Visit Diagnosis: Unsteadiness on feet (R26.81);Muscle weakness (generalized) (M62.81)                Time: 1033-1100 OT Time Calculation (min): 27  min Charges:  OT General Charges $OT Visit: 1 Visit OT Evaluation $OT Eval Moderate Complexity: 1 Mod   Brynn, OTR/L  Acute Rehabilitation Services Pager: [phone] Office: [phone] .   Jeri Modena 06/19/2019, 4:42 PM

## 2019-06-19 NOTE — Progress Notes (Signed)
LTM EEG started, Dr Hortense Ramal and DR Aroor aware.  No skin break down noted.  Nurse educated regarding LTM and pt event button.

## 2019-06-19 NOTE — Progress Notes (Signed)
Progress Note    Michelle Farrell  WNU:[id number] DOB: 1940/08/14  DOA: 06/18/2019 PCP: Doretha Sou, MD    Brief Narrative:    Medical records reviewed and are as summarized below:  Michelle Farrell is an 79 y.o. female who was brought in by grand daughter from home with seizure like activity.  Patient has known seizure disorder.  Patient was given 2 mg of Ativan and still not responsive and therefore patient was placed in observation for prolonged post ictal state.  Patient was transferred from [facility] to [facility].  Patient given IV Keppra and IV Vimpat.  EEG in progress  Assessment/Plan:   Active Problems:   Diabetes mellitus ([place])   OBSTRUCTIVE SLEEP APNEA   Hypertension   Diastolic CHF ([place])   Crohn's disease ([place])   Acute encephalopathy   Tobacco abuse   Anemia in chronic kidney disease   Dyslipidemia   Seizure ([place])  Prolonged seizure activity/ictal state -Patient getting stat EEG -IV Keppra and IV Vimpat -Seizure precautions -MRI brain after EEG (has not yet been ordered)  Obstructive sleep apnea -Patient does not wear CPAP at home  Hypertension -Restart home meds when able to take p.o.  History of chronic diastolic CHF -We will hold IV fluids as well as any diuretics  Tobacco abuse -Encourage cessation  Diabetes type 2 -Sliding scale insulin -Hold p.o. medications -A1c: 6.1    Family Communication/Anticipated D/C date and plan/Code Status   DVT prophylaxis: Lovenox ordered. Code Status: Full Code.  Family Communication:  Disposition Plan: Patient continues to need to remain inpatient as she is getting an EEG and medications will need to be adjusted by neurology   Medical Consultants:    Neurology     Subjective:   Patient complaining of being cold  Objective:    Vitals:   06/18/19 2051 06/18/19 2322 06/19/19 0356 06/19/19 0726  BP: (!) 176/63 (!) 147/92 (!) 166/75 (!) 158/87  Pulse: (!) 52 (!) 57 74 64   Resp: 18 16 16 20   Temp: 99.8 F (37.7 C) 100.1 F (37.8 C) 99.6 F (37.6 C) 98.7 F (37.1 C)  TempSrc: Oral Oral Oral Oral  SpO2: 100% 99% 100% 100%  Weight:      Height:        Intake/Output Summary (Last 24 hours) at 06/19/2019 0347 Last data filed at 06/19/2019 4259 Gross per 24 hour  Intake 2040.43 ml  Output 1300 ml  Net 740.43 ml   Filed Weights   06/18/19 1051  Weight: 54.4 kg    Exam: In bed with EEG leads attached Patient will awaken and respond to verbal commands and make her needs known Does not appear to be in any respiratory distress, no wheezing Older than stated age Poor dentition Moves all 4 extremities  Data Reviewed:   I have personally reviewed following labs and imaging studies:  Labs: Labs show the following:   Basic Metabolic Panel: Recent Labs  Lab 06/18/19 0852 06/19/19 0333  NA 139 141  K 4.1 4.3  CL 108 110  CO2 18* 22  GLUCOSE 206* 102*  BUN 19 9  CREATININE 1.27* 1.06*  CALCIUM 10.0 10.1  MG  --  2.0  PHOS  --  2.2*   GFR Estimated Creatinine Clearance: 38.2 mL/min (A) (by C-G formula based on SCr of 1.06 mg/dL (H)). Liver Function Tests: Recent Labs  Lab 06/18/19 0852 06/19/19 0333  AST 111* 94*  ALT 93* 87*  ALKPHOS 105 82  BILITOT 0.5 0.6  PROT 8.6* 7.8  ALBUMIN 4.0 3.4*   No results for input(s): LIPASE, AMYLASE in the last 168 hours. No results for input(s): AMMONIA in the last 168 hours. Coagulation profile No results for input(s): INR, PROTIME in the last 168 hours.  CBC: Recent Labs  Lab 06/18/19 0852 06/19/19 0333  WBC 10.3 13.2*  HGB 13.8 13.3  HCT 41.8 40.0  MCV 92.1 90.9  PLT 218 188   Cardiac Enzymes: Recent Labs  Lab 06/18/19 0852  CKTOTAL 44   BNP (last 3 results) No results for input(s): PROBNP in the last 8760 hours. CBG: Recent Labs  Lab 06/18/19 0849 06/18/19 2346 06/19/19 0354 06/19/19 0736  GLUCAP 181* 91 97 84   D-Dimer: No results for input(s): DDIMER in the last  72 hours. Hgb A1c: Recent Labs    06/19/19 0333  HGBA1C 6.1*   Lipid Profile: No results for input(s): CHOL, HDL, LDLCALC, TRIG, CHOLHDL, LDLDIRECT in the last 72 hours. Thyroid function studies: Recent Labs    06/19/19 0333  TSH 0.358   Anemia work up: No results for input(s): VITAMINB12, FOLATE, FERRITIN, TIBC, IRON, RETICCTPCT in the last 72 hours. Sepsis Labs: Recent Labs  Lab 06/18/19 0852 06/18/19 0915 06/18/19 1323 06/19/19 0333  WBC 10.3  --   --  13.2*  LATICACIDVEN  --  3.9* 1.7  --     Microbiology Recent Results (from the past 240 hour(s))  SARS CORONAVIRUS 2 (TAT 6-24 HRS) Nasopharyngeal Nasopharyngeal Swab     Status: None   Collection Time: 06/18/19  2:44 PM   Specimen: Nasopharyngeal Swab  Result Value Ref Range Status   SARS Coronavirus 2 NEGATIVE NEGATIVE Final    Comment: (NOTE) SARS-CoV-2 target nucleic acids are NOT DETECTED. The SARS-CoV-2 RNA is generally detectable in upper and lower respiratory specimens during the acute phase of infection. Negative results do not preclude SARS-CoV-2 infection, do not rule out co-infections with other pathogens, and should not be used as the sole basis for treatment or other patient management decisions. Negative results must be combined with clinical observations, patient history, and epidemiological information. The expected result is Negative. Fact Sheet for Patients: SugarRoll.be Fact Sheet for Healthcare Providers: https://www.woods-mathews.com/ This test is not yet approved or cleared by the [place] FDA and  has been authorized for detection and/or diagnosis of SARS-CoV-2 by FDA under an Emergency Use Authorization (EUA). This EUA will remain  in effect (meaning this test can be used) for the duration of the COVID-19 declaration under Section 56 4(b)(1) of the Act, 21 U.S.C. section 360bbb-3(b)(1), unless the authorization is terminated or revoked  sooner. Performed at [facility], [place] [address] [place], [place] [address]     Procedures and diagnostic studies:  CT HEAD WO CONTRAST  Result Date: 06/18/2019 CLINICAL DATA:  Status epilepticus. EXAM: CT HEAD WITHOUT CONTRAST TECHNIQUE: Contiguous axial images were obtained from the base of the skull through the vertex without intravenous contrast. COMPARISON:  03/27/2019 head CT. FINDINGS: Brain: No evidence of parenchymal hemorrhage or extra-axial fluid collection. No mass lesion, mass effect, or midline shift. No CT evidence of acute infarction. Nonspecific moderate subcortical and periventricular white matter hypodensity, most in keeping with chronic small vessel ischemic change. Chronic mild anterior right temporal lobe encephalomalacia. No ventriculomegaly. Vascular: No acute abnormality. Skull: No evidence of calvarial fracture. Sinuses/Orbits: No fluid levels. Mild mucoperiosteal thickening in the right maxillary sinus. Other:  The mastoid air cells are unopacified. IMPRESSION: 1. No  evidence of acute intracranial abnormality. No evidence of calvarial fracture. 2. Chronic mild anterior right temporal lobe encephalomalacia. 3. Moderate chronic small vessel ischemic changes in the cerebral white matter. Electronically Signed   By: Ilona Sorrel M.D.   On: 06/18/2019 09:43   DG Chest Port 1 View  Result Date: 06/18/2019 CLINICAL DATA:  Aspiration during seizure. EXAM: PORTABLE CHEST 1 VIEW COMPARISON:  March 30, 2019. FINDINGS: The heart size and mediastinal contours are within normal limits. Both lungs are clear. No pneumothorax or pleural effusion is noted. The visualized skeletal structures are unremarkable. IMPRESSION: No acute cardiopulmonary abnormality seen. Aortic Atherosclerosis (ICD10-I70.0). Electronically Signed   By: Marijo Conception M.D.   On: 06/18/2019 09:36    Medications:   . atorvastatin  40 mg Oral q1800  . clopidogrel  75 mg Oral Daily  . enoxaparin (LOVENOX)  injection  40 mg Subcutaneous QHS  . hydrALAZINE  25 mg Oral TID  . insulin aspart  0-9 Units Subcutaneous Q4H  . isosorbide mononitrate  30 mg Oral Daily  . lacosamide  225 mg Oral Daily  . lacosamide  300 mg Oral QHS   Continuous Infusions: . lacosamide (VIMPAT) IV 200 mg (06/18/19 2301)  . levETIRAcetam 1,500 mg (06/19/19 0813)     LOS: 0 days   Geradine Girt  [facility]   How to contact the [facility] Attending or Consulting provider [place] or covering provider during after hours [place], for this patient?  1. Check the care team in [facility] and look for a) attending/consulting TR[facility] provider listed and b) the [facility] team listed 2. Log into [email or web address] and use [place]'s universal password to access. If you do not have the password, please contact the hospital operator. 3. Locate the [facility] provider you are looking for under [facility] and page to a number that you can be directly reached. 4. If you still have difficulty reaching the provider, please page the [facility] (Director on Call) for the Hospitalists listed on amion for assistance.  06/19/2019, 9:26 AM

## 2019-06-19 NOTE — Progress Notes (Signed)
Central monitor reported is having sunus brady to sinus rhythm with junctional rhythm.  Patient is resting

## 2019-06-20 LAB — GLUCOSE, CAPILLARY
Glucose-Capillary: 71 mg/dL (ref 70–99)
Glucose-Capillary: 70 mg/dL (ref 70–99)
Glucose-Capillary: 191 mg/dL — ABNORMAL HIGH (ref 70–99)
Glucose-Capillary: 87 mg/dL (ref 70–99)
Glucose-Capillary: 143 mg/dL — ABNORMAL HIGH (ref 70–99)
Glucose-Capillary: 113 mg/dL — ABNORMAL HIGH (ref 70–99)

## 2019-06-20 MED ORDER — PANTOPRAZOLE SODIUM 40 MG PO TBEC
40.0000 mg | DELAYED_RELEASE_TABLET | Freq: Two times a day (BID) | ORAL | Status: DC
  Administered 2019-06-20 – 2019-06-21 (×2): 40 mg via ORAL
  Filled 2019-06-20 (×2): qty 1

## 2019-06-20 MED ORDER — POTASSIUM & SODIUM PHOSPHATES 280-160-250 MG PO PACK
1.0000 | PACK | Freq: Three times a day (TID) | ORAL | Status: DC
  Administered 2019-06-20 – 2019-06-21 (×5): 1 via ORAL
  Filled 2019-06-20 (×7): qty 1

## 2019-06-20 NOTE — NC FL2 (Signed)
[place] MEDICAID FL2 LEVEL OF CARE SCREENING TOOL     IDENTIFICATION  Patient Name: Michelle Farrell Birthdate: 16-Mar-1941 Sex: female Admission Date (Current Location): 06/18/2019  [facility] and [place] Number:  Herbalist and Address:  [facility] [place], [place] [address], [place], [place] [address]      Provider Number: [id number]  Attending Physician Name and Address:  Hosie Poisson, MD  Relative Name and Phone Number:       Current Level of Care: Hospital Recommended Level of Care: [place] Prior Approval Number:    Date Approved/Denied:   PASRR Number: [id number]  Discharge Plan: SNF    Current Diagnoses: Patient Active Problem List   Diagnosis Date Noted  . Uncontrolled seizures ([place]) 06/19/2019  . Seizure ([place]) 06/18/2019  . 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 Excessive sleepiness 10/13/2014  . Seizure disorder as sequela of cerebrovascular accident ([place]) 09/14/2014  . Cerebral embolism with cerebral infarction ([place]) 09/13/2014  . Sinus bradycardia 09/12/2014  . Dyslipidemia 09/12/2014  . Convulsion ([place])   . Anemia in chronic kidney disease 07/13/2014  . Abnormal CT scan, stomach 07/08/2014  . Duodenitis with hemorrhage 07/08/2014  . Gastritis and gastroduodenitis 07/08/2014  . Esophagitis determined by endoscopy 07/08/2014  . Hypokalemia 07/04/2014  . Anemia 07/04/2014  . 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 Sepsis secondary to UTI ([place])   . Acute encephalopathy 06/19/2014  . Tobacco abuse 06/19/2014  . Crohn's disease ([place]) 06/12/2014  . Pelvic mass 05/16/2014  . Hypertension 05/16/2014  . Diastolic CHF ([place]) 63/78/5885  . Back pain, chronic 05/16/2014  . OBSTRUCTIVE SLEEP APNEA 11/14/2008  . Diabetes mellitus ([place]) 06/28/2007  . NEUROPATHY 06/28/2007  . ESOPHAGEAL STRICTURE 06/28/2007  . HIATAL HERNIA 06/28/2007  . POLYARTHRITIS 06/28/2007   . PUD, HX OF 06/28/2007    Orientation RESPIRATION BLADDER Height & Weight     Place, Self  Normal Incontinent Weight: 120 lb (54.4 kg) Height:  5' 6"  (167.6 cm)  BEHAVIORAL SYMPTOMS/MOOD NEUROLOGICAL BOWEL NUTRITION STATUS      Incontinent Diet(see DC summary)  AMBULATORY STATUS COMMUNICATION OF NEEDS Skin   Extensive Assist Verbally Normal                       Personal Care Assistance Level of Assistance  Bathing, Feeding, Dressing Bathing Assistance: Maximum assistance Feeding assistance: Maximum assistance Dressing Assistance: Maximum assistance     Functional Limitations Info             SPECIAL CARE FACTORS FREQUENCY  PT (By licensed PT), OT (By licensed OT)     PT Frequency: 5x/wk OT Frequency: 5x/wk            Contractures Contractures Info: Not present    Additional Factors Info  Code Status, Allergies, Insulin Sliding Scale Code Status Info: Full Allergies Info: Tape, Tramadol   Insulin Sliding Scale Info: 0-9 units every 4 hours       Current Medications (06/20/2019):  This is the current hospital active medication list Current Facility-Administered Medications  Medication Dose Route Frequency Provider Last Rate Last Admin  . acetaminophen (TYLENOL) tablet 650 mg  650 mg Oral Q6H PRN Toy Baker, MD       Or  . acetaminophen (TYLENOL) suppository 650 mg  650 mg Rectal Q6H PRN Doutova, Anastassia, MD      . atorvastatin (LIPITOR) tablet 40 mg  40 mg Oral q1800 Doutova,  Anastassia, MD   40 mg at 06/19/19 1743  . clopidogrel (PLAVIX) tablet 75 mg  75 mg Oral Daily Toy Baker, MD   75 mg at 06/20/19 0915  . diclofenac Sodium (VOLTAREN) 1 % topical gel 2 g  2 g Topical QID Vann, Jessica U, DO   2 g at 06/20/19 0915  . enoxaparin (LOVENOX) injection 40 mg  40 mg Subcutaneous QHS Doutova, Anastassia, MD   40 mg at 06/19/19 2137  . hydrALAZINE (APRESOLINE) tablet 25 mg  25 mg Oral TID Toy Baker, MD   25 mg at 06/20/19  0915  . insulin aspart (novoLOG) injection 0-9 Units  0-9 Units Subcutaneous Q4H Toy Baker, MD   1 Units at 06/19/19 2150  . isosorbide mononitrate (IMDUR) 24 hr tablet 30 mg  30 mg Oral Daily Doutova, Anastassia, MD   30 mg at 06/20/19 0915  . lacosamide (VIMPAT) tablet 225 mg  225 mg Oral q AM Lora Havens, MD   225 mg at 06/20/19 0836  . lacosamide (VIMPAT) tablet 300 mg  300 mg Oral QHS Lora Havens, MD   300 mg at 06/19/19 2136  . levETIRAcetam (KEPPRA) tablet 1,000 mg  1,000 mg Oral QHS Lora Havens, MD   1,000 mg at 06/19/19 2136  . levETIRAcetam (KEPPRA) tablet 1,500 mg  1,500 mg Oral q AM Lora Havens, MD   1,500 mg at 06/20/19 0836  . ondansetron (ZOFRAN) tablet 4 mg  4 mg Oral Q6H PRN Toy Baker, MD       Or  . ondansetron (ZOFRAN) injection 4 mg  4 mg Intravenous Q6H PRN Doutova, Anastassia, MD      . pantoprazole (PROTONIX) injection 40 mg  40 mg Intravenous Q12H Vann, Jessica U, DO   40 mg at 06/20/19 0915  . potassium & sodium phosphates (PHOS-NAK) 280-160-250 MG packet 1 packet  1 packet Oral TID WC & HS Hosie Poisson, MD       Facility-Administered Medications Ordered in Other Encounters  Medication Dose Route Frequency Provider Last Rate Last Admin  . sodium chloride 0.9 % injection 10-40 mL  10-40 mL Intracatheter PRN Ladene Artist, MD   10 mL at 07/09/14 0501     Discharge Medications: Please see discharge summary for a list of discharge medications.  Relevant Imaging Results:  Relevant Lab Results:   Additional Information SS#: [id number]  Geralynn Ochs, LCSW

## 2019-06-20 NOTE — Progress Notes (Signed)
PROGRESS NOTE    Michelle Farrell  ZYS:[id number] DOB: 07/16/1940 DOA: 06/18/2019 PCP: Doretha Sou, MD   Brief Narrative:  79 year old lady with prior history of hypertension diabetes seizure disorder presents with a prolonged postictal state patient was transferred from [place] to [facility] for further evaluation neurology consulted and she was started on IV Keppra and IV Vimpat.  EEG done. Therapy evaluation recommending SNF. TOC consulted for SNF placement.  Assessment & Plan:   Active Problems:   Diabetes mellitus ([place])   OBSTRUCTIVE SLEEP APNEA   Hypertension   Diastolic CHF ([place])   Crohn's disease ([place])   Acute encephalopathy   Tobacco abuse   Anemia in chronic kidney disease   Dyslipidemia   Seizure ([place])   Uncontrolled seizures ([place])   Focal epilepsy with breakthrough seizures EEG does not show any new seizure activity.  Patient is alert and answering questions appropriately and following commands. Continue with Keppra 1500 mg every morning, 1000 mg every afternoon, Vimpat of 225 mg in the morning and 300 mg at night. .  Appreciate neurology recommendations. Seizure precautions MRI of the brain shows No acute intracranial abnormality. Small areas of encephalomalacia and gliosis in the anterior right temporal lobe and inferior right frontal lobe, present since May 21, 2004. Mild chronic small vessel ischemic changes of the white matter.  Asymmetry of the hippocampi with small left hippocampus may represent mesial temporal sclerosis in the setting of chronic seizure disorder.   Mild leukocytosis No signs of infection UA and chest x-ray are negative for infection. Repeat CBC in the morning.  Chronic diastolic heart failure Patient appears to be compensated.   Essential hypertension Blood pressure parameters are well controlled.   Diet-controlled diabetes mellitus A1c is 6.1.    DVT prophylaxis: Lovenox Code Status: Full  code Family Communication: Discussed with the granddaughter over the phone Disposition Plan:  . Patient came from: Home            . Anticipated d/c place: SNF . Barriers to d/c OR conditions which need to be met to effect a safe d/c: TOC consulted for SNF placement.  Patient is not safe for discharge home family is having trouble providing 24-hour care for the patient at home.   Consultants:   Neurology  Procedures: EEG Antimicrobials: None  Subjective: Patient denies any chest pain, shortness of breath, nausea, vomiting or abdominal pain  Objective: Vitals:   06/19/19 2327 06/20/19 0322 06/20/19 0833 06/20/19 1111  BP: (!) 101/56 (!) 105/42 (!) 106/49 (!) 100/45  Pulse: (!) 46 (!) 43 (!) 46 (!) 56  Resp: 18 18 16 16   Temp: 99 F (37.2 C) 97.6 F (36.4 C) 98.6 F (37 C) 99.1 F (37.3 C)  TempSrc: Oral Oral Oral Axillary  SpO2: 100% 99% 100% 97%  Weight:      Height:        Intake/Output Summary (Last 24 hours) at 06/20/2019 1217 Last data filed at 06/20/2019 0945 Gross per 24 hour  Intake 358 ml  Output --  Net 358 ml   Filed Weights   06/18/19 1051  Weight: 54.4 kg    Examination:  General exam: Appears calm and comfortable  Respiratory system: Clear to auscultation. Respiratory effort normal. Cardiovascular system: S1 & S2 heard, bradycardic. No JVD,  No pedal edema. Gastrointestinal system: Abdomen is nondistended, soft and nontender.Normal bowel sounds heard. Central nervous system: Alert and oriented to person and place, grossly nonfocal Extremities: No pedal edema or cyanosis Skin:  No rashes seen Psychiatry:  Mood & affect appropriate.     Data Reviewed: I have personally reviewed following labs and imaging studies  CBC: Recent Labs  Lab 06/18/19 0852 06/19/19 0333  WBC 10.3 13.2*  HGB 13.8 13.3  HCT 41.8 40.0  MCV 92.1 90.9  PLT 218 080   Basic Metabolic Panel: Recent Labs  Lab 06/18/19 0852 06/19/19 0333  NA 139 141  K 4.1 4.3  CL  108 110  CO2 18* 22  GLUCOSE 206* 102*  BUN 19 9  CREATININE 1.27* 1.06*  CALCIUM 10.0 10.1  MG  --  2.0  PHOS  --  2.2*   GFR: Estimated Creatinine Clearance: 38.2 mL/min (A) (by C-G formula based on SCr of 1.06 mg/dL (H)). Liver Function Tests: Recent Labs  Lab 06/18/19 0852 06/19/19 0333  AST 111* 94*  ALT 93* 87*  ALKPHOS 105 82  BILITOT 0.5 0.6  PROT 8.6* 7.8  ALBUMIN 4.0 3.4*   No results for input(s): LIPASE, AMYLASE in the last 168 hours. No results for input(s): AMMONIA in the last 168 hours. Coagulation Profile: No results for input(s): INR, PROTIME in the last 168 hours. Cardiac Enzymes: Recent Labs  Lab 06/18/19 0852  CKTOTAL 44   BNP (last 3 results) No results for input(s): PROBNP in the last 8760 hours. HbA1C: Recent Labs    06/19/19 0333  HGBA1C 6.1*   CBG: Recent Labs  Lab 06/19/19 2134 06/19/19 2322 06/20/19 0321 06/20/19 0834 06/20/19 1109  GLUCAP 129* 100* 71 70 191*   Lipid Profile: No results for input(s): CHOL, HDL, LDLCALC, TRIG, CHOLHDL, LDLDIRECT in the last 72 hours. Thyroid Function Tests: Recent Labs    06/19/19 0333  TSH 0.358   Anemia Panel: No results for input(s): VITAMINB12, FOLATE, FERRITIN, TIBC, IRON, RETICCTPCT in the last 72 hours. Sepsis Labs: Recent Labs  Lab 06/18/19 0915 06/18/19 1323  LATICACIDVEN 3.9* 1.7    Recent Results (from the past 240 hour(s))  SARS CORONAVIRUS 2 (TAT 6-24 HRS) Nasopharyngeal Nasopharyngeal Swab     Status: None   Collection Time: 06/18/19  2:44 PM   Specimen: Nasopharyngeal Swab  Result Value Ref Range Status   SARS Coronavirus 2 NEGATIVE NEGATIVE Final    Comment: (NOTE) SARS-CoV-2 target nucleic acids are NOT DETECTED. The SARS-CoV-2 RNA is generally detectable in upper and lower respiratory specimens during the acute phase of infection. Negative results do not preclude SARS-CoV-2 infection, do not rule out co-infections with other pathogens, and should not be used  as the sole basis for treatment or other patient management decisions. Negative results must be combined with clinical observations, patient history, and epidemiological information. The expected result is Negative. Fact Sheet for Patients: SugarRoll.be Fact Sheet for Healthcare Providers: https://www.woods-mathews.com/ This test is not yet approved or cleared by the [place] FDA and  has been authorized for detection and/or diagnosis of SARS-CoV-2 by FDA under an Emergency Use Authorization (EUA). This EUA will remain  in effect (meaning this test can be used) for the duration of the COVID-19 declaration under Section 56 4(b)(1) of the Act, 21 U.S.C. section 360bbb-3(b)(1), unless the authorization is terminated or revoked sooner. Performed at [facility], [place] [address], [place], [place] [address]          Radiology Studies: MR BRAIN WO CONTRAST  Result Date: 06/19/2019 CLINICAL DATA:  Neuro deficit. Acute stroke suspected. EXAM: MRI HEAD WITHOUT CONTRAST TECHNIQUE: Multiplanar, multiecho pulse sequences of the brain and surrounding structures  were obtained without intravenous contrast. COMPARISON:  Head CT June 18, 2019 FINDINGS: Brain: No acute infarction, hemorrhage, hydrocephalus, extra-axial collection or mass lesion. Again seen are small areas of encephalomalacia and gliosis in the anterior right temporal lobe and inferior right frontal lobe present since CT performed in May 21, 2004. Pattern is suggestive of posttraumatic insult. Scattered foci of T2 hyperintensity are seen within the white matter of the cerebral hemispheres, nonspecific, most likely related to chronic small vessel ischemia. Asymmetry of the hippocampi noted with small left hippocampus may represent mesial temporal sclerosis in the setting of chronic seizure disorder. Vascular: Normal flow voids. Skull and upper cervical spine: Normal marrow signal.  Degenerative changes noted in the visualized upper cervical spine with disc protrusion at C3-4. Sinuses/Orbits: Bilateral lens surgery. Mild mucosal thickening of the ethmoid cells, frontal and maxillary sinuses. IMPRESSION: 1. No acute intracranial abnormality. 2. Small areas of encephalomalacia and gliosis in the anterior right temporal lobe and inferior right frontal lobe, present since May 21, 2004. 3. Mild chronic small vessel ischemic changes of the white matter. 4. Asymmetry of the hippocampi with small left hippocampus may represent mesial temporal sclerosis in the setting of chronic seizure disorder. Electronically Signed   By: Pedro Earls M.D.   On: 06/19/2019 14:30   Overnight EEG with video  Result Date: 06/19/2019 Lora Havens, MD     06/19/2019  2:15 PM Patient Name: Michelle Farrell MRN: [id number] Epilepsy Attending: Lora Havens Referring Physician/Provider: Dr Karena Addison Aroor Duration: 06/19/2019 0404 to 06/19/2019 1228 Patient history: 79 year old female with past medical history of epilepsy who presented with breakthrough seizures.  EEG evaluate for subclinical seizures. Level of alertness: Awake, sleep AEDs during EEG study: Keppra, Vimpat, Ativan Technical aspects: This EEG study was done with scalp electrodes positioned according to the 10-20 International system of electrode placement. Electrical activity was acquired at a sampling rate of 500Hz  and reviewed with a high frequency filter of 70Hz  and a low frequency filter of 1Hz . EEG data were recorded continuously and digitally stored. Description:  The posterior dominant rhythm consists of 7 Hz activity of moderate voltage (25-35 uV) seen predominantly in posterior head regions, symmetric and reactive to eye opening and eye closing.  Sleep was characterized by vertex waves, sleep spindles (13 to 15 Hz), maximal frontocentral.  Left temporal sharp waves (maximal T7 more than F7 more than T7) were also noted.   Hyperventilation photic stimulation were not performed. Abnormality -Background slow, generalized -Sharp waves, left temporal region    IMPRESSION: This study showed evidence of epileptogenicity in the left temporal region as well as mild diffuse encephalopathy, nonspecific etiology. No seizures were seen throughout the recording. Priyanka Barbra Sarks        Scheduled Meds: . atorvastatin  40 mg Oral q1800  . clopidogrel  75 mg Oral Daily  . diclofenac Sodium  2 g Topical QID  . enoxaparin (LOVENOX) injection  40 mg Subcutaneous QHS  . hydrALAZINE  25 mg Oral TID  . insulin aspart  0-9 Units Subcutaneous Q4H  . isosorbide mononitrate  30 mg Oral Daily  . lacosamide  225 mg Oral q AM  . lacosamide  300 mg Oral QHS  . levETIRAcetam  1,000 mg Oral QHS  . levETIRAcetam  1,500 mg Oral q AM  . pantoprazole (PROTONIX) IV  40 mg Intravenous Q12H  . potassium & sodium phosphates  1 packet Oral TID WC & HS   Continuous Infusions:   LOS: 1  day       Hosie Poisson, MD [facility]   To contact the attending provider between 7A-7P or the covering provider during after hours 7P-7A, please log into the web site [email or web address] and access using universal [place] password for that web site. If you do not have the password, please call the hospital operator.  06/20/2019, 12:17 PM

## 2019-06-20 NOTE — Progress Notes (Signed)
Physical Therapy Treatment Patient Details Name: Michelle Farrell MRN: [id number] DOB: 03-19-1941 Today's Date: 06/20/2019    History of Present Illness Pt is a 79 y.o. F with significant PMH of diabetes mellitus, HTN, CHF, Crohn's disease, tobacco abuse, and seizure who presents with seizure like activity and prolonged post ictal state. Given IV Keppra and IV Vimpat. EEG in progress.    PT Comments    Pt supine in bed, she has difficulty answering orientation questions correctly.  Based on her poor cognitive status and unsteady gt she will continue to require 24 hr assistance at d/c.  Pt did progress activity and tolerated stair training.  Plan next session to reinforce safety during mobility.     Follow Up Recommendations  Home health PT;Supervision/Assistance - 24 hour     Equipment Recommendations  Wheelchair (measurements PT)    Recommendations for Other Services       Precautions / Restrictions Precautions Precautions: Fall Restrictions Weight Bearing Restrictions: No    Mobility  Bed Mobility Overal bed mobility: Needs Assistance Bed Mobility: Supine to Sit;Sit to Supine     Supine to sit: Min guard Sit to supine: Min guard   General bed mobility comments: Pt able to come to sitting and return back to bed with increased time and tactile cueing.  Continues to require step by step instruction to complete task.  Transfers Overall transfer level: Needs assistance Equipment used: 4-wheeled walker Transfers: Sit to/from Stand Sit to Stand: Min guard         General transfer comment: MinA to stand from edge of bed, initial retropulsion noted.  Pt with no awareness to place brakes before standing and remove brakes once in standing.  Ambulation/Gait Ambulation/Gait assistance: Min Web designer (Feet): 250 Feet Assistive device: 4-wheeled walker Gait Pattern/deviations: Step-through pattern;Decreased stride length;Narrow base of support;Leaning  posteriorly;Drifts right/left Gait velocity: decreased   General Gait Details: Min assistance to keep correct pathway of rollator.  Pt with weaving gt and poor balance.  Unsteady with poor safety awareness.   Stairs Stairs: Yes Stairs assistance: Min assist Stair Management: Two rails Number of Stairs: 4 General stair comments: Cues for sequencing and use of hand placement.   Wheelchair Mobility    Modified Rankin (Stroke Patients Only)       Balance Overall balance assessment: Needs assistance Sitting-balance support: Feet supported Sitting balance-Leahy Scale: Fair Sitting balance - Comments: requiring intermittent min-modA due to right lateral lean     Standing balance-Leahy Scale: Poor Standing balance comment: external assistance for safety                            Cognition Arousal/Alertness: Awake/alert Behavior During Therapy: Flat affect;Impulsive Overall Cognitive Status: Impaired/Different from baseline Area of Impairment: Orientation;Attention;Awareness;Problem solving                 Orientation Level: Disoriented to;Time;Situation Current Attention Level: Focused;Sustained   Following Commands: Follows one step commands with increased time   Awareness: Intellectual Problem Solving: Slow processing;Decreased initiation General Comments: Pt required repeated cueing through out session.  When asked for her name she would only give her first name, asked for her birthday and she would report her last name.  Pt continued to answer questions with her birthdate and her last name even though it was not the correct answer.  She was unable to find her room and kept attempting to enter different patient's room.  Exercises      General Comments        Pertinent Vitals/Pain Pain Assessment: Faces Faces Pain Scale: No hurt    Home Living                      Prior Function            PT Goals (current goals can now be  found in the care plan section) Acute Rehab PT Goals Patient Stated Goal: none stated Progress towards PT goals: Progressing toward goals    Frequency    Min 3X/week      PT Plan Current plan remains appropriate    Co-evaluation              AM-PAC PT "6 Clicks" Mobility   Outcome Measure  Help needed turning from your back to your side while in a flat bed without using bedrails?: A Little Help needed moving from lying on your back to sitting on the side of a flat bed without using bedrails?: A Little Help needed moving to and from a bed to a chair (including a wheelchair)?: A Little Help needed standing up from a chair using your arms (e.g., wheelchair or bedside chair)?: A Little Help needed to walk in hospital room?: A Lot Help needed climbing 3-5 steps with a railing? : A Lot 6 Click Score: 16    End of Session Equipment Utilized During Treatment: Gait belt Activity Tolerance: Patient tolerated treatment well Patient left: in bed;with call bell/phone within reach;with bed alarm set Nurse Communication: Mobility status PT Visit Diagnosis: Unsteadiness on feet (R26.81);Difficulty in walking, not elsewhere classified (R26.2)     Time: 3716-9678 PT Time Calculation (min) (ACUTE ONLY): 24 min  Charges:  $Gait Training: 8-22 mins $Therapeutic Activity: 8-22 mins                     Erasmo Leventhal , PTA Acute Rehabilitation Services Pager [phone] Office [phone]     Sherma Vanmetre Eli Hose 06/20/2019, 5:27 PM

## 2019-06-20 NOTE — Progress Notes (Signed)
CM spoke to patients granddaughter and she says, prior to admission to the hospital,  the patient was able to feed herself at home but required meal prep. She was able to wash herself and do her own dressing with min assist. She used a rollator and was able to ambulate around the home.

## 2019-06-20 NOTE — TOC Initial Note (Signed)
Transition of Care [facility]) - Initial/Assessment Note    Patient Details  Name: Michelle Farrell MRN: [id number] Date of Birth: [birth year]  [facility] CM/SW Contact:    Pollie Friar, RN Phone Number: 06/20/2019, 11:59 AM  Clinical Narrative:                 CM spoke to pts Granddaughter: Michelle Farrell. She states that family and caregiver have been providing supervision at home but this has been a struggle. She is interested in having pt faxed out for SNF in [facility]. CM emailed her a list of SNF facilities in [place]. CSW to complete FL2.  TOC following.   Expected Discharge Plan: Skilled Nursing Facility Barriers to Discharge: Continued Medical Work up   Patient Goals and CMS Choice   CMS Medicare.gov Compare Post Acute Care list provided to:: Patient Represenative (must comment) Choice offered to / list presented to : (Granddaughter)  Expected Discharge Plan and Services Expected Discharge Plan: [place] In-house Referral: Clinical Social Work Discharge Planning Services: CM Consult Post Acute Care Choice: [place] arrangements for the past 2 months: [place]                                      Prior Living Arrangements/Services Living arrangements for the past 2 months: Single Family Home Lives with:: Other (Comment)(family and caregivers) Patient language and need for interpreter reviewed:: Yes        Need for Family Participation in Patient Care: Yes (Comment) Care giver support system in place?: No (comment)(family is having trouble getting the 24 hour supervision covered)   Criminal Activity/Legal Involvement Pertinent to Current Situation/Hospitalization: No - Comment as needed  Activities of Daily Living      Permission Sought/Granted                  Emotional Assessment   Attitude/Demeanor/Rapport: (confused)   Orientation: : Oriented to Self   Psych Involvement: No  (comment)  Admission diagnosis:  Seizure ([place]) [R56.9] Uncontrolled seizures ([place]) [R56.9] Patient Active Problem List   Diagnosis Date Noted  . Uncontrolled seizures ([place]) 06/19/2019  . Seizure ([place]) 06/18/2019  . 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 Partial seizure disorder ([place]) 10/13/2014  . Excessive sleepiness 10/13/2014  . Seizure disorder as sequela of cerebrovascular accident ([place]) 09/14/2014  . 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 Sinus bradycardia 09/12/2014  . Dyslipidemia 09/12/2014  . Convulsion ([place])   . Anemia in chronic kidney disease 07/13/2014  . Abnormal CT scan, stomach 07/08/2014  . Duodenitis with hemorrhage 07/08/2014  . Gastritis and gastroduodenitis 07/08/2014  . Esophagitis determined by endoscopy 07/08/2014  . Hypokalemia 07/04/2014  . Anemia 07/04/2014  . 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 Sepsis secondary to UTI ([place])   . Acute encephalopathy 06/19/2014  . Tobacco abuse 06/19/2014  . Crohn's disease ([place]) 06/12/2014  . Pelvic mass 05/16/2014  . Hypertension 05/16/2014  . Diastolic CHF ([place]) 64/33/2951  . Back pain, chronic 05/16/2014  . OBSTRUCTIVE SLEEP APNEA 11/14/2008  . Diabetes mellitus ([place]) 06/28/2007  . NEUROPATHY 06/28/2007  . ESOPHAGEAL STRICTURE 06/28/2007  . HIATAL HERNIA 06/28/2007  . POLYARTHRITIS 06/28/2007  . PUD, HX OF 06/28/2007   PCP:  Doretha Sou, MD Pharmacy:   [place], [address] [place]  [place] [address] Phone: [phone] Fax: [phone]     Social Determinants of Health (SDOH) Interventions    Readmission Risk Interventions No flowsheet data found.

## 2019-06-21 LAB — COMPREHENSIVE METABOLIC PANEL
ALT: 57 U/L — ABNORMAL HIGH (ref 0–44)
AST: 65 U/L — ABNORMAL HIGH (ref 15–41)
Albumin: 2.8 g/dL — ABNORMAL LOW (ref 3.5–5.0)
Alkaline Phosphatase: 69 U/L (ref 38–126)
Anion gap: 9 (ref 5–15)
BUN: 17 mg/dL (ref 8–23)
CO2: 23 mmol/L (ref 22–32)
Calcium: 9.9 mg/dL (ref 8.9–10.3)
Chloride: 111 mmol/L (ref 98–111)
Creatinine, Ser: 1.37 mg/dL — ABNORMAL HIGH (ref 0.44–1.00)
GFR calc Af Amer: 43 mL/min — ABNORMAL LOW (ref >60)
GFR calc non Af Amer: 37 mL/min — ABNORMAL LOW (ref >60)
Glucose, Bld: 108 mg/dL — ABNORMAL HIGH (ref 70–99)
Potassium: 4.3 mmol/L (ref 3.5–5.1)
Sodium: 143 mmol/L (ref 135–145)
Total Bilirubin: 0.6 mg/dL (ref 0.3–1.2)
Total Protein: 6.3 g/dL — ABNORMAL LOW (ref 6.5–8.1)

## 2019-06-21 LAB — GLUCOSE, CAPILLARY
Glucose-Capillary: 100 mg/dL — ABNORMAL HIGH (ref 70–99)
Glucose-Capillary: 152 mg/dL — ABNORMAL HIGH (ref 70–99)
Glucose-Capillary: 140 mg/dL — ABNORMAL HIGH (ref 70–99)

## 2019-06-21 LAB — CBC
HCT: 32.7 % — ABNORMAL LOW (ref 36.0–46.0)
Hemoglobin: 11 g/dL — ABNORMAL LOW (ref 12.0–15.0)
MCH: 30.1 pg (ref 26.0–34.0)
MCHC: 33.6 g/dL (ref 30.0–36.0)
MCV: 89.3 fL (ref 80.0–100.0)
Platelets: 153 10*3/uL (ref 150–400)
RBC: 3.66 MIL/uL — ABNORMAL LOW (ref 3.87–5.11)
RDW: 14.7 % (ref 11.5–15.5)
WBC: 6.8 10*3/uL (ref 4.0–10.5)
nRBC: 0 % (ref 0.0–0.2)

## 2019-06-21 MED ORDER — LACOSAMIDE 150 MG PO TABS: 300.0000 mg | ORAL_TABLET | Freq: Every day | ORAL | 1 refills | Status: AC

## 2019-06-21 MED ORDER — LEVETIRACETAM 1000 MG PO TABS: 1000.0000 mg | ORAL_TABLET | Freq: Every day | ORAL | 1 refills | Status: DC

## 2019-06-21 MED ORDER — LACOSAMIDE 50 MG PO TABS: 225.0000 mg | ORAL_TABLET | Freq: Every morning | ORAL | 2 refills | Status: AC

## 2019-06-21 MED ORDER — LEVETIRACETAM 750 MG PO TABS: 1500.0000 mg | ORAL_TABLET | Freq: Every morning | ORAL | 1 refills | Status: DC

## 2019-06-21 NOTE — Progress Notes (Signed)
Physical Therapy Treatment Patient Details Name: Michelle Farrell MRN: [id number] DOB: 1940-09-25 Today's Date: 06/21/2019    History of Present Illness Pt is a 79 y.o. F with significant PMH of diabetes mellitus, HTN, CHF, Crohn's disease, tobacco abuse, and seizure who presents with seizure like activity and prolonged post ictal state. Given IV Keppra and IV Vimpat. EEG in progress.    PT Comments    Pt supine in bed on arrival this session.  She has difficulty following conversation and perseverates on words or topics.  She responds to simple commands this session.  Pt continues to benefit from HHPT with support from her family at home.   Pt awaiting rollator for d/c home.    Follow Up Recommendations  Home health PT;Supervision/Assistance - 24 hour     Equipment Recommendations  Other (comment)(Rollator/4 wheeled RW with a seat.)    Recommendations for Other Services       Precautions / Restrictions Precautions Precautions: Fall Restrictions Weight Bearing Restrictions: No    Mobility  Bed Mobility Overal bed mobility: Needs Assistance Bed Mobility: Supine to Sit     Supine to sit: Supervision     General bed mobility comments: Pt responds to direct simple commands.  Transfers Overall transfer level: Needs assistance Equipment used: 4-wheeled walker Transfers: Sit to/from Stand Sit to Stand: Min guard         General transfer comment: Min guard to rise to standing, responds to simple direct commands.  Pt continues to have no awareness of brakes but her daughter is very knowledgeable with the device.  Ambulation/Gait Ambulation/Gait assistance: Min Web designer (Feet): 250 Feet Assistive device: 4-wheeled walker Gait Pattern/deviations: Step-through pattern;Decreased stride length;Narrow base of support;Leaning posteriorly;Drifts right/left Gait velocity: decreased   General Gait Details: Min assistance to keep correct pathway of rollator.  Pt with  weaving gt and attempted to follow the maintenance man into a room.   Stairs   Stairs assistance: Min guard Stair Management: Two rails Number of Stairs: 6 General stair comments: Cues for sequencing and use of hand placement.   Wheelchair Mobility    Modified Rankin (Stroke Patients Only)       Balance Overall balance assessment: Needs assistance   Sitting balance-Leahy Scale: Fair Sitting balance - Comments: requiring intermittent min-modA due to right lateral lean     Standing balance-Leahy Scale: Poor Standing balance comment: external assistance for safety                            Cognition Arousal/Alertness: Awake/alert Behavior During Therapy: Flat affect;Impulsive Overall Cognitive Status: Impaired/Different from baseline Area of Impairment: Orientation;Attention;Awareness;Problem solving                 Orientation Level: Disoriented to;Time;Situation Current Attention Level: Focused;Sustained   Following Commands: Follows one step commands with increased time   Awareness: Intellectual Problem Solving: Slow processing;Decreased initiation General Comments: Pt required repeated cueing through out session.  She perseverates on words and tends to answer questions innappropriately.  She remains unable to negotiate halls back to her room.      Exercises      General Comments        Pertinent Vitals/Pain Pain Assessment: Faces Faces Pain Scale: No hurt    Home Living                      Prior Function  PT Goals (current goals can now be found in the care plan section) Acute Rehab PT Goals Patient Stated Goal: none stated Potential to Achieve Goals: Good Progress towards PT goals: Progressing toward goals    Frequency    Min 3X/week      PT Plan Current plan remains appropriate    Co-evaluation              AM-PAC PT "6 Clicks" Mobility   Outcome Measure  Help needed turning from your  back to your side while in a flat bed without using bedrails?: A Little Help needed moving from lying on your back to sitting on the side of a flat bed without using bedrails?: A Little Help needed moving to and from a bed to a chair (including a wheelchair)?: A Little Help needed standing up from a chair using your arms (e.g., wheelchair or bedside chair)?: A Little Help needed to walk in hospital room?: A Lot Help needed climbing 3-5 steps with a railing? : A Lot 6 Click Score: 16    End of Session Equipment Utilized During Treatment: Gait belt Activity Tolerance: Patient tolerated treatment well Patient left: in bed;with call bell/phone within reach;with bed alarm set Nurse Communication: Mobility status PT Visit Diagnosis: Unsteadiness on feet (R26.81);Difficulty in walking, not elsewhere classified (R26.2)     Time: 9323-5573 PT Time Calculation (min) (ACUTE ONLY): 34 min  Charges:  $Gait Training: 8-22 mins $Therapeutic Activity: 8-22 mins                     Erasmo Leventhal , PTA Acute Rehabilitation Services Pager [phone] Office [phone]     Merrilee Ancona Eli Hose 06/21/2019, 2:30 PM

## 2019-06-21 NOTE — Progress Notes (Addendum)
Tele reported a 2nd degree HB, saved tele strip in chart. Dr. Karleen Hampshire notified. Nurse called to confirm findings, tele reported that the second degree was only artifact. Rhythm is now junctional.  Michelle Farrell

## 2019-06-21 NOTE — TOC Transition Note (Signed)
Transition of Care [facility]) - CM/SW Discharge Note   Patient Details  Name: Michelle Farrell MRN: [id number] Date of Birth: 1940-12-29  Transition of Care [facility]) CM/SW Contact:  Pollie Friar, RN Phone Number: 06/21/2019, 1:30 PM   Clinical Narrative:    Pt's granddaughter has decided to take her home. She is going to have 24 hour supervision arranged. They were active with [facility] and want to continue with them. [place] with [facility] updated on resumption of services.  Pt has orders for rollator. [employer] will deliver this DME to the room. Pt has transportation home.   Final next level of care: Home w Home Health Services Barriers to Discharge: No Barriers Identified   Patient Goals and CMS Choice   CMS Medicare.gov Compare Post Acute Care list provided to:: Patient Represenative (must comment) Choice offered to / list presented to : (Granddaughter)  Discharge Placement                       Discharge Plan and Services In-house Referral: Clinical Social Work Discharge Planning Services: CM Consult Post Acute Care Choice: [place]          DME Arranged: Walker rolling with seat DME Agency: [employer] Date DME Agency Contacted: 06/21/19   Representative spoke with at DME Agency: [place]: OT, PT Why Agency: [facility] [employer] Date [place]: 06/21/19   Representative spoke with at [place]: [place] Interventions     Readmission Risk Interventions No flowsheet data found.

## 2019-06-21 NOTE — TOC Progression Note (Signed)
Transition of Care [facility]) - Progression Note    Patient Details  Name: Michelle Farrell MRN: [id number] Date of Birth: [birth year]  Transition of Care [facility]) CM/SW Contact  Pollie Friar, RN Phone Number: 06/21/2019, 11:11 AM  Clinical Narrative:    CM provided choice of SNF to granddaughter yesterday. CM called her this am and no decision as of yet. CM has informed her the patient is medically ready. CM following for facility choice. Insurance authorization is pending facility.   Expected Discharge Plan: [place] Barriers to Discharge: Continued Medical Work up  Expected Discharge Plan and Services Expected Discharge Plan: [place] In-house Referral: Clinical Social Work Discharge Planning Services: CM Consult Post Acute Care Choice: [place] arrangements for the past 2 months: Single Family Home                                       Social Determinants of Health (SDOH) Interventions    Readmission Risk Interventions No flowsheet data found.

## 2019-06-21 NOTE — Progress Notes (Signed)
PT Cancellation Note  Patient Details Name: Michelle Farrell MRN: [id number] DOB: 10-17-1940   Cancelled Treatment:    Reason Eval/Treat Not Completed: (P) Other (comment)(Pt eating lunch will f/u per POC.)   Haldon Carley Eli Hose 06/21/2019, 12:30 PM Erasmo Leventhal , PTA Acute Rehabilitation Services Pager [phone] Office [phone]

## 2019-06-21 NOTE — Progress Notes (Addendum)
  Speech Language Pathology Treatment: Dysphagia  Patient Details Name: Michelle Farrell MRN: [id number] DOB: 03-08-1941 Today's Date: 06/21/2019 Time: 6226-3335 SLP Time Calculation (min) (ACUTE ONLY): 8 min  Assessment / Plan / Recommendation Clinical Impression  Pt was seen for skilled ST targeting diet tolerance.  She was encountered awake/alert but she reported that she was hoping to take a nap soon.  When asked, pt stated that her granddaughter had not brought her dentures to the hospital and she did not believe that her granddaughter planned on doing so.  Even without dentures, pt reported that she was tolerating her current diet without difficulty.  Pt consumed trials of thin liquid via straw sip and regular solids (cracker) given set up.  She exhibited mildly prolonged mastication secondary to edentulism; however, mastication was effective and no oral residue was observed following swallow initiation.  Additionally, no overt s/sx of aspiration were observed with any trials.  Recommend continuation of Dysphagia 3 (soft) solids and thin liquids with medication administered whole in puree.  Pt is afebrile, her most recent CXR is clear, and her WBC count is WNL.  No further skilled ST is warranted at this time.  Please re-consult if additional needs arise.     HPI HPI: 79 yo female adm to [facility] with seizure type of activity.  PMH + for seizures, CVA, Crohn's, COVID 96 in 02/2019, COPD, CHF.  Swallow eval ordered.  Pt with prolonged post-ictical state and neuro consult completed.  She has been undergoing EEG.  CXR negative for acute change.  Pt admits to h/o coughing sometimes with intake - food more than drink reported by her and her granddaughter *[name]* via telephone.  Pt does not have dentures at hospital but granddaughter bringing them as she normally uses them when she eats/drinks.  She is also s/p dilatation 2003 and 2008.  Pt had endoscopy in 06/2014 and was found to have mild erosive gastritis, 5  cm HH and esophagitis and was advised to take omeprazole 40 BID long term.  Per her granddaughter she has not been on any reflux medication for "years" - since 2017 likely. Granddtr reports pt with more coughing with intake since her stroke - also ? some reflux component however.      SLP Plan  All goals met       Recommendations  Diet recommendations: Dysphagia 3 (mechanical soft);Thin liquid Liquids provided via: Cup;Straw Medication Administration: Whole meds with puree Supervision: Patient able to self feed;Intermittent supervision to cue for compensatory strategies Compensations: Slow rate;Small sips/bites                Oral Care Recommendations: Oral care BID Follow up Recommendations: None SLP Visit Diagnosis: Dysphagia, oral phase (R13.11) Plan: All goals met                      Colin Mulders M.S., [place] Acute Rehabilitation Services Office: [phone]  [place] 06/21/2019, 10:01 AM

## 2019-06-22 NOTE — Discharge Summary (Signed)
Physician Discharge Summary  Michelle Farrell FTD:[id number] DOB: Jan 09, 1941 DOA: 06/18/2019  PCP: Doretha Sou, MD  Admit date: 06/18/2019 Discharge date: 06/21/2019  Admitted From:Home.  Disposition:  Home.   Recommendations for Outpatient Follow-up:  1. Follow up with PCP in 1-2 weeks 2. Please obtain BMP/CBC in one week 3. Please follow up with neurology as scheduled.   Home Health:yes   Discharge Condition:stable.  CODE STATUS: full code.  Diet recommendation: Heart Healthy  Brief/Interim Summary: 79 year old lady with prior history of hypertension diabetes seizure disorder presents with a prolonged postictal state patient was transferred from [place] to [facility] for further evaluation neurology consulted and she was started on IV Keppra and IV Vimpat.  EEG done. Therapy evaluation recommending SNF. TOC consulted for SNF placement.family wanted to take her home with home health.    Discharge Diagnoses:  Active Problems:   Diabetes mellitus ([place])   OBSTRUCTIVE SLEEP APNEA   Hypertension   Diastolic CHF ([place])   Crohn's disease ([place])   Acute encephalopathy   Tobacco abuse   Anemia in chronic kidney disease   Dyslipidemia   Seizure ([place])   Uncontrolled seizures ([place])  Focal epilepsy with breakthrough seizures EEG does not show any new seizure activity.  Patient is alert and answering questions appropriately and following commands. Continue with Keppra 1500 mg every morning, 1000 mg every afternoon, Vimpat of 225 mg in the morning and 300 mg at night.  Appreciate neurology recommendations. Seizure precautions MRI of the brain shows No acute intracranial abnormality. Small areas of encephalomalacia and gliosis in the anterior right temporal lobe and inferior right frontal lobe, present since May 21, 2004. Mild chronic small vessel ischemic changes of the white matter.  Asymmetry of the hippocampi with small left hippocampus may represent mesial  temporal sclerosis in the setting of chronic seizure disorder.   Mild leukocytosis No signs of infection UA and chest x-ray are negative for infection.   Chronic diastolic heart failure Patient appears to be compensated.   Essential hypertension Blood pressure parameters are well controlled.   Diet-controlled diabetes mellitus A1c is 6.1.    Discharge Instructions  Discharge Instructions    Diet - low sodium heart healthy   Complete by: As directed    Discharge instructions   Complete by: As directed    Please follow up with PCP in one week and Neurology as recommended.     Allergies as of 06/21/2019      Reactions   Tape Other (See Comments)   NO PLASTIC TAPE- Bubbles the skin!!   Tramadol Other (See Comments)   Seizures       Medication List    TAKE these medications   acetaminophen 325 MG tablet Commonly known as: TYLENOL Take 325 mg by mouth every 6 (six) hours as needed for mild pain or headache.   atorvastatin 40 MG tablet Commonly known as: LIPITOR Take 40 mg by mouth at bedtime.   clopidogrel 75 MG tablet Commonly known as: PLAVIX Take 1 tablet (75 mg total) by mouth daily. What changed: when to take this   hydrALAZINE 25 MG tablet Commonly known as: APRESOLINE Take 25 mg by mouth See admin instructions. Take 25 mg by mouth at 8 AM, 25 mg at 2 PM, and 25 mg at 8 PM   isosorbide mononitrate 30 MG 24 hr tablet Commonly known as: IMDUR Take 30 mg by mouth in the morning.   Lacosamide 150 MG Tabs Take 2 tablets (300 mg  total) by mouth at bedtime. What changed: You were already taking a medication with the same name, and this prescription was added. Make sure you understand how and when to take each.   lacosamide 50 MG Tabs tablet Commonly known as: VIMPAT Take 4.5 tablets (225 mg total) by mouth in the morning. What changed:   medication strength  how much to take  when to take this   levETIRAcetam 1000 MG tablet Commonly  known as: KEPPRA Take 1 tablet (1,000 mg total) by mouth at bedtime. What changed: You were already taking a medication with the same name, and this prescription was added. Make sure you understand how and when to take each.   levETIRAcetam 750 MG tablet Commonly known as: KEPPRA Take 2 tablets (1,500 mg total) by mouth in the morning. What changed:   medication strength  how much to take  how to take this  when to take this  additional instructions      Follow-up Information    Doretha Sou, MD. Schedule an appointment as soon as possible for a visit in 1 week(s).   Specialty: Internal Medicine Contact information: [place] [address] [phone]          Allergies  Allergen Reactions  . Tape Other (See Comments)    NO PLASTIC TAPE- Bubbles the skin!!  . Tramadol Other (See Comments)    Seizures     Consultations:  Neurology     Procedures/Studies: CT HEAD WO CONTRAST  Result Date: 06/18/2019 CLINICAL DATA:  Status epilepticus. EXAM: CT HEAD WITHOUT CONTRAST TECHNIQUE: Contiguous axial images were obtained from the base of the skull through the vertex without intravenous contrast. COMPARISON:  03/27/2019 head CT. FINDINGS: Brain: No evidence of parenchymal hemorrhage or extra-axial fluid collection. No mass lesion, mass effect, or midline shift. No CT evidence of acute infarction. Nonspecific moderate subcortical and periventricular white matter hypodensity, most in keeping with chronic small vessel ischemic change. Chronic mild anterior right temporal lobe encephalomalacia. No ventriculomegaly. Vascular: No acute abnormality. Skull: No evidence of calvarial fracture. Sinuses/Orbits: No fluid levels. Mild mucoperiosteal thickening in the right maxillary sinus. Other:  The mastoid air cells are unopacified. IMPRESSION: 1. No evidence of acute intracranial abnormality. No evidence of calvarial fracture. 2. Chronic mild anterior right  temporal lobe encephalomalacia. 3. Moderate chronic small vessel ischemic changes in the cerebral white matter. Electronically Signed   By: Ilona Sorrel M.D.   On: 06/18/2019 09:43   MR BRAIN WO CONTRAST  Result Date: 06/19/2019 CLINICAL DATA:  Neuro deficit. Acute stroke suspected. EXAM: MRI HEAD WITHOUT CONTRAST TECHNIQUE: Multiplanar, multiecho pulse sequences of the brain and surrounding structures were obtained without intravenous contrast. COMPARISON:  Head CT June 18, 2019 FINDINGS: Brain: No acute infarction, hemorrhage, hydrocephalus, extra-axial collection or mass lesion. Again seen are small areas of encephalomalacia and gliosis in the anterior right temporal lobe and inferior right frontal lobe present since CT performed in May 21, 2004. Pattern is suggestive of posttraumatic insult. Scattered foci of T2 hyperintensity are seen within the white matter of the cerebral hemispheres, nonspecific, most likely related to chronic small vessel ischemia. Asymmetry of the hippocampi noted with small left hippocampus may represent mesial temporal sclerosis in the setting of chronic seizure disorder. Vascular: Normal flow voids. Skull and upper cervical spine: Normal marrow signal. Degenerative changes noted in the visualized upper cervical spine with disc protrusion at C3-4. Sinuses/Orbits: Bilateral lens surgery. Mild mucosal thickening of the ethmoid cells, frontal  and maxillary sinuses. IMPRESSION: 1. No acute intracranial abnormality. 2. Small areas of encephalomalacia and gliosis in the anterior right temporal lobe and inferior right frontal lobe, present since May 21, 2004. 3. Mild chronic small vessel ischemic changes of the white matter. 4. Asymmetry of the hippocampi with small left hippocampus may represent mesial temporal sclerosis in the setting of chronic seizure disorder. Electronically Signed   By: Pedro Earls M.D.   On: 06/19/2019 14:30   DG Chest Port 1  View  Result Date: 06/18/2019 CLINICAL DATA:  Aspiration during seizure. EXAM: PORTABLE CHEST 1 VIEW COMPARISON:  March 30, 2019. FINDINGS: The heart size and mediastinal contours are within normal limits. Both lungs are clear. No pneumothorax or pleural effusion is noted. The visualized skeletal structures are unremarkable. IMPRESSION: No acute cardiopulmonary abnormality seen. Aortic Atherosclerosis (ICD10-I70.0). Electronically Signed   By: Marijo Conception M.D.   On: 06/18/2019 09:36   Overnight EEG with video  Result Date: 06/19/2019 Lora Havens, MD     06/19/2019  2:15 PM Patient Name: Michelle Farrell MRN: [id number] Epilepsy Attending: Lora Havens Referring Physician/Provider: Dr Karena Addison Aroor Duration: 06/19/2019 0404 to 06/19/2019 1228 Patient history: 79 year old female with past medical history of epilepsy who presented with breakthrough seizures.  EEG evaluate for subclinical seizures. Level of alertness: Awake, sleep AEDs during EEG study: Keppra, Vimpat, Ativan Technical aspects: This EEG study was done with scalp electrodes positioned according to the 10-20 International system of electrode placement. Electrical activity was acquired at a sampling rate of 500Hz  and reviewed with a high frequency filter of 70Hz  and a low frequency filter of 1Hz . EEG data were recorded continuously and digitally stored. Description:  The posterior dominant rhythm consists of 7 Hz activity of moderate voltage (25-35 uV) seen predominantly in posterior head regions, symmetric and reactive to eye opening and eye closing.  Sleep was characterized by vertex waves, sleep spindles (13 to 15 Hz), maximal frontocentral.  Left temporal sharp waves (maximal T7 more than F7 more than T7) were also noted.  Hyperventilation photic stimulation were not performed. Abnormality -Background slow, generalized -Sharp waves, left temporal region    IMPRESSION: This study showed evidence of epileptogenicity in the left temporal  region as well as mild diffuse encephalopathy, nonspecific etiology. No seizures were seen throughout the recording. Priyanka Barbra Sarks       Subjective: No new complaints.   Discharge Exam: Vitals:   06/21/19 0854 06/21/19 1126  BP: (!) 127/53 (!) 110/46  Pulse: (!) 41 68  Resp: 20 20  Temp: 98.7 F (37.1 C) 98.6 F (37 C)  SpO2: 100% 99%   Vitals:   06/20/19 2319 06/21/19 0310 06/21/19 0854 06/21/19 1126  BP: 111/63 132/60 (!) 127/53 (!) 110/46  Pulse: (!) 48 (!) 42 (!) 41 68  Resp: 19 17 20 20   Temp: 98.4 F (36.9 C) 98.6 F (37 C) 98.7 F (37.1 C) 98.6 F (37 C)  TempSrc: Oral Oral Oral Oral  SpO2: 93% 100% 100% 99%  Weight:      Height:        General: Pt is alert, awake, not in acute distress Cardiovascular: RRR, S1/S2 +, no rubs, no gallops Respiratory: CTA bilaterally, no wheezing, no rhonchi Abdominal: Soft, NT, ND, bowel sounds + Extremities: no edema, no cyanosis    The results of significant diagnostics from this hospitalization (including imaging, microbiology, ancillary and laboratory) are listed below for reference.     Microbiology: Recent Results (from  the past 240 hour(s))  SARS CORONAVIRUS 2 (TAT 6-24 HRS) Nasopharyngeal Nasopharyngeal Swab     Status: None   Collection Time: 06/18/19  2:44 PM   Specimen: Nasopharyngeal Swab  Result Value Ref Range Status   SARS Coronavirus 2 NEGATIVE NEGATIVE Final    Comment: (NOTE) SARS-CoV-2 target nucleic acids are NOT DETECTED. The SARS-CoV-2 RNA is generally detectable in upper and lower respiratory specimens during the acute phase of infection. Negative results do not preclude SARS-CoV-2 infection, do not rule out co-infections with other pathogens, and should not be used as the sole basis for treatment or other patient management decisions. Negative results must be combined with clinical observations, patient history, and epidemiological information. The expected result is Negative. Fact Sheet  for Patients: SugarRoll.be Fact Sheet for Healthcare Providers: https://www.woods-mathews.com/ This test is not yet approved or cleared by the [place] FDA and  has been authorized for detection and/or diagnosis of SARS-CoV-2 by FDA under an Emergency Use Authorization (EUA). This EUA will remain  in effect (meaning this test can be used) for the duration of the COVID-19 declaration under Section 56 4(b)(1) of the Act, 21 U.S.C. section 360bbb-3(b)(1), unless the authorization is terminated or revoked sooner. Performed at [facility], [place] [address], [place], [place] [address]      Labs: BNP (last 3 results) No results for input(s): BNP in the last 8760 hours. Basic Metabolic Panel: Recent Labs  Lab 06/18/19 0852 06/19/19 0333 06/21/19 0517  NA 139 141 143  K 4.1 4.3 4.3  CL 108 110 111  CO2 18* 22 23  GLUCOSE 206* 102* 108*  BUN 19 9 17   CREATININE 1.27* 1.06* 1.37*  CALCIUM 10.0 10.1 9.9  MG  --  2.0  --   PHOS  --  2.2*  --    Liver Function Tests: Recent Labs  Lab 06/18/19 0852 06/19/19 0333 06/21/19 0517  AST 111* 94* 65*  ALT 93* 87* 57*  ALKPHOS 105 82 69  BILITOT 0.5 0.6 0.6  PROT 8.6* 7.8 6.3*  ALBUMIN 4.0 3.4* 2.8*   No results for input(s): LIPASE, AMYLASE in the last 168 hours. No results for input(s): AMMONIA in the last 168 hours. CBC: Recent Labs  Lab 06/18/19 0852 06/19/19 0333 06/21/19 0517  WBC 10.3 13.2* 6.8  HGB 13.8 13.3 11.0*  HCT 41.8 40.0 32.7*  MCV 92.1 90.9 89.3  PLT 218 188 153   Cardiac Enzymes: Recent Labs  Lab 06/18/19 0852  CKTOTAL 44   BNP: Invalid input(s): POCBNP CBG: Recent Labs  Lab 06/20/19 1937 06/20/19 2317 06/21/19 0309 06/21/19 0851 06/21/19 1123  GLUCAP 143* 113* 100* 152* 140*   D-Dimer No results for input(s): DDIMER in the last 72 hours. Hgb A1c No results for input(s): HGBA1C in the last 72 hours. Lipid Profile No results for input(s):  CHOL, HDL, LDLCALC, TRIG, CHOLHDL, LDLDIRECT in the last 72 hours. Thyroid function studies No results for input(s): TSH, T4TOTAL, T3FREE, THYROIDAB in the last 72 hours.  Invalid input(s): FREET3 Anemia work up No results for input(s): VITAMINB12, FOLATE, FERRITIN, TIBC, IRON, RETICCTPCT in the last 72 hours. Urinalysis    Component Value Date/Time   COLORURINE YELLOW 06/18/2019 1217   APPEARANCEUR CLOUDY (A) 06/18/2019 1217   LABSPEC <1.005 (L) 06/18/2019 1217   PHURINE 6.5 06/18/2019 1217   GLUCOSEU NEGATIVE 06/18/2019 1217   HGBUR NEGATIVE 06/18/2019 1217   BILIRUBINUR NEGATIVE 06/18/2019 1217   KETONESUR NEGATIVE 06/18/2019 1217   PROTEINUR NEGATIVE  06/18/2019 1217   UROBILINOGEN 0.2 09/12/2014 0915   NITRITE NEGATIVE 06/18/2019 1217   LEUKOCYTESUR NEGATIVE 06/18/2019 1217   Sepsis Labs Invalid input(s): PROCALCITONIN,  WBC,  LACTICIDVEN Microbiology Recent Results (from the past 240 hour(s))  SARS CORONAVIRUS 2 (TAT 6-24 HRS) Nasopharyngeal Nasopharyngeal Swab     Status: None   Collection Time: 06/18/19  2:44 PM   Specimen: Nasopharyngeal Swab  Result Value Ref Range Status   SARS Coronavirus 2 NEGATIVE NEGATIVE Final    Comment: (NOTE) SARS-CoV-2 target nucleic acids are NOT DETECTED. The SARS-CoV-2 RNA is generally detectable in upper and lower respiratory specimens during the acute phase of infection. Negative results do not preclude SARS-CoV-2 infection, do not rule out co-infections with other pathogens, and should not be used as the sole basis for treatment or other patient management decisions. Negative results must be combined with clinical observations, patient history, and epidemiological information. The expected result is Negative. Fact Sheet for Patients: SugarRoll.be Fact Sheet for Healthcare Providers: https://www.woods-mathews.com/ This test is not yet approved or cleared by the [place] FDA and  has  been authorized for detection and/or diagnosis of SARS-CoV-2 by FDA under an Emergency Use Authorization (EUA). This EUA will remain  in effect (meaning this test can be used) for the duration of the COVID-19 declaration under Section 56 4(b)(1) of the Act, 21 U.S.C. section 360bbb-3(b)(1), unless the authorization is terminated or revoked sooner. Performed at [facility], [place] [address], [place], [place] [address]      Time coordinating discharge: 35 min  SIGNED:   Hosie Poisson, MD  [facility]

## 2019-06-26 ENCOUNTER — Ambulatory Visit: Payer: Medicare Other | Attending: Internal Medicine

## 2019-06-26 ENCOUNTER — Ambulatory Visit: Payer: Medicare Other

## 2019-06-26 DIAGNOSIS — Z23 Encounter for immunization: Secondary | ICD-10-CM

## 2019-06-26 NOTE — Progress Notes (Signed)
   Covid-19 Vaccination Clinic  Name:  Michelle Farrell    MRN: [id number] DOB: [birth year]  06/26/2019  [name] was observed post Covid-19 immunization for 15 minutes without incident. She was provided with Vaccine Information Sheet and instruction to access the V-Safe system.   [name] was instructed to call 911 with any severe reactions post vaccine: Marland Kitchen Difficulty breathing  . Swelling of face and throat  . A fast heartbeat  . A bad rash all over body  . Dizziness and weakness   Immunizations Administered    Name Date Dose VIS Date Route   Pfizer COVID-19 Vaccine 06/26/2019  4:04 PM 0.3 mL 03/08/2019 Intramuscular   Manufacturer: [place]   Lot: [id number]   [place]: [id number]

## 2020-11-21 ENCOUNTER — Emergency Department (HOSPITAL_BASED_OUTPATIENT_CLINIC_OR_DEPARTMENT_OTHER)
Admission: EM | Admit: 2020-11-21 | Discharge: 2020-11-21 | Disposition: A | Discharge status: Home / Self Care | Payer: Medicare Other | Attending: Emergency Medicine | Admitting: Emergency Medicine

## 2020-11-21 ENCOUNTER — Encounter (HOSPITAL_BASED_OUTPATIENT_CLINIC_OR_DEPARTMENT_OTHER): Payer: Self-pay | Admitting: Urology

## 2020-11-21 ENCOUNTER — Emergency Department (HOSPITAL_BASED_OUTPATIENT_CLINIC_OR_DEPARTMENT_OTHER): Payer: Medicare Other

## 2020-11-21 ENCOUNTER — Other Ambulatory Visit: Payer: Self-pay

## 2020-11-21 DIAGNOSIS — M25512 Pain in left shoulder: Secondary | ICD-10-CM | POA: Insufficient documentation

## 2020-11-21 DIAGNOSIS — I130 Hypertensive heart and chronic kidney disease with heart failure and stage 1 through stage 4 chronic kidney disease, or unspecified chronic kidney disease: Secondary | ICD-10-CM | POA: Insufficient documentation

## 2020-11-21 DIAGNOSIS — R519 Headache, unspecified: Secondary | ICD-10-CM | POA: Insufficient documentation

## 2020-11-21 DIAGNOSIS — R300 Dysuria: Secondary | ICD-10-CM | POA: Insufficient documentation

## 2020-11-21 DIAGNOSIS — I5030 Unspecified diastolic (congestive) heart failure: Secondary | ICD-10-CM | POA: Diagnosis not present

## 2020-11-21 DIAGNOSIS — M25552 Pain in left hip: Secondary | ICD-10-CM | POA: Diagnosis not present

## 2020-11-21 DIAGNOSIS — Z87891 Personal history of nicotine dependence: Secondary | ICD-10-CM | POA: Diagnosis not present

## 2020-11-21 DIAGNOSIS — N189 Chronic kidney disease, unspecified: Secondary | ICD-10-CM | POA: Diagnosis not present

## 2020-11-21 DIAGNOSIS — M25532 Pain in left wrist: Secondary | ICD-10-CM | POA: Insufficient documentation

## 2020-11-21 DIAGNOSIS — J449 Chronic obstructive pulmonary disease, unspecified: Secondary | ICD-10-CM | POA: Diagnosis not present

## 2020-11-21 DIAGNOSIS — Y92002 Bathroom of unspecified non-institutional (private) residence single-family (private) house as the place of occurrence of the external cause: Secondary | ICD-10-CM | POA: Insufficient documentation

## 2020-11-21 DIAGNOSIS — W19XXXA Unspecified fall, initial encounter: Secondary | ICD-10-CM

## 2020-11-21 DIAGNOSIS — Z7902 Long term (current) use of antithrombotics/antiplatelets: Secondary | ICD-10-CM | POA: Insufficient documentation

## 2020-11-21 DIAGNOSIS — E1122 Type 2 diabetes mellitus with diabetic chronic kidney disease: Secondary | ICD-10-CM | POA: Diagnosis not present

## 2020-11-21 DIAGNOSIS — R001 Bradycardia, unspecified: Secondary | ICD-10-CM | POA: Diagnosis not present

## 2020-11-21 DIAGNOSIS — Y92009 Unspecified place in unspecified non-institutional (private) residence as the place of occurrence of the external cause: Secondary | ICD-10-CM

## 2020-11-21 DIAGNOSIS — W01198A Fall on same level from slipping, tripping and stumbling with subsequent striking against other object, initial encounter: Secondary | ICD-10-CM | POA: Insufficient documentation

## 2020-11-21 LAB — CBC WITH DIFFERENTIAL/PLATELET
Abs Immature Granulocytes: 0.02 10*3/uL (ref 0.00–0.07)
Basophils Absolute: 0 10*3/uL (ref 0.0–0.1)
Basophils Relative: 0 %
Eosinophils Absolute: 0.2 10*3/uL (ref 0.0–0.5)
Eosinophils Relative: 2 %
HCT: 41.1 % (ref 36.0–46.0)
Hemoglobin: 13.8 g/dL (ref 12.0–15.0)
Immature Granulocytes: 0 %
Lymphocytes Relative: 23 %
Lymphs Abs: 1.9 10*3/uL (ref 0.7–4.0)
MCH: 30.3 pg (ref 26.0–34.0)
MCHC: 33.6 g/dL (ref 30.0–36.0)
MCV: 90.1 fL (ref 80.0–100.0)
Monocytes Absolute: 0.8 10*3/uL (ref 0.1–1.0)
Monocytes Relative: 9 %
Neutro Abs: 5.6 10*3/uL (ref 1.7–7.7)
Neutrophils Relative %: 66 %
Platelets: 230 10*3/uL (ref 150–400)
RBC: 4.56 MIL/uL (ref 3.87–5.11)
RDW: 15.3 % (ref 11.5–15.5)
WBC: 8.6 10*3/uL (ref 4.0–10.5)
nRBC: 0 % (ref 0.0–0.2)

## 2020-11-21 LAB — COMPREHENSIVE METABOLIC PANEL
ALT: 18 U/L (ref 0–44)
AST: 27 U/L (ref 15–41)
Albumin: 4.2 g/dL (ref 3.5–5.0)
Alkaline Phosphatase: 110 U/L (ref 38–126)
Anion gap: 7 (ref 5–15)
BUN: 16 mg/dL (ref 8–23)
CO2: 26 mmol/L (ref 22–32)
Calcium: 10.2 mg/dL (ref 8.9–10.3)
Chloride: 108 mmol/L (ref 98–111)
Creatinine, Ser: 1.14 mg/dL — ABNORMAL HIGH (ref 0.44–1.00)
GFR, Estimated: 49 mL/min — ABNORMAL LOW (ref >60)
Glucose, Bld: 119 mg/dL — ABNORMAL HIGH (ref 70–99)
Potassium: 3.8 mmol/L (ref 3.5–5.1)
Sodium: 141 mmol/L (ref 135–145)
Total Bilirubin: 0.3 mg/dL (ref 0.3–1.2)
Total Protein: 7.7 g/dL (ref 6.5–8.1)

## 2020-11-21 LAB — URINALYSIS, ROUTINE W REFLEX MICROSCOPIC
Bilirubin Urine: NEGATIVE
Glucose, UA: NEGATIVE mg/dL
Ketones, ur: NEGATIVE mg/dL
Nitrite: NEGATIVE
Protein, ur: NEGATIVE mg/dL
Specific Gravity, Urine: 1.01 (ref 1.005–1.030)
pH: 6 (ref 5.0–8.0)

## 2020-11-21 LAB — URINALYSIS, MICROSCOPIC (REFLEX)

## 2020-11-21 MED ORDER — CEPHALEXIN 500 MG PO CAPS: 500.0000 mg | ORAL_CAPSULE | Freq: Four times a day (QID) | ORAL | 0 refills | Status: AC

## 2020-11-21 MED ORDER — ACETAMINOPHEN 325 MG PO TABS
650.0000 mg | ORAL_TABLET | Freq: Once | ORAL | Status: AC
  Administered 2020-11-21: 650 mg via ORAL
  Filled 2020-11-21: qty 2

## 2020-11-21 NOTE — ED Provider Notes (Signed)
[place] EMERGENCY DEPARTMENT Provider Note   CSN: [id number] Arrival date & time: 11/21/20  1253     History Chief Complaint  Patient presents with   Michelle Farrell    Michelle Farrell is a 80 y.o. female with a past medical history of DM hypertension and seizure who presented today with a complaint of a fall.  Patient reports that she slipped this morning after getting out of the bathtub.  Says that she hit her head and has pain in her left shoulder, left wrist and left hip.  Denies LOC, visual changes, nausea and vomiting.  Has been compliant with her seizure medication and is adamant she did not have a seizure.  Remembers her fall and immediately called her granddaughter who is here with her today.  Patient also endorsing some dysuria that is "on and off."  Associated hematuria.   Fall Pertinent negatives include no chest pain, no headaches and no shortness of breath.      Past Medical History:  Diagnosis Date   AKI (acute kidney injury) ([place])    Arthritis    Back pain, chronic    CHF (congestive heart failure) (HCC)    CKD (chronic kidney disease)    COPD (chronic obstructive pulmonary disease) (HCC)    Crohn disease (HCC)    Diabetes mellitus without complication ([place])    Esophageal stricture    s/p dilation 2003, 2008   GERD (gastroesophageal reflux disease)    Hip pain, chronic    Hypertension    PUD (peptic ulcer disease)    Seizures ([place])    Sleep apnea    Stroke [facility])     Patient Active Problem List   Diagnosis Date Noted   Uncontrolled seizures (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   Partial seizure disorder ([place]) 10/13/2014   Excessive sleepiness 10/13/2014   Seizure disorder as sequela of cerebrovascular accident ([place]) 09/14/2014   Cerebral embolism with cerebral infarction ([place]) 09/13/2014   Sinus bradycardia 09/12/2014   Dyslipidemia 09/12/2014   Convulsion ([place])    Anemia in chronic kidney disease  07/13/2014   Abnormal CT scan, stomach 07/08/2014   Duodenitis with hemorrhage 07/08/2014   Gastritis and gastroduodenitis 07/08/2014   Esophagitis determined by endoscopy 07/08/2014   Hypokalemia 07/04/2014   Anemia 07/04/2014   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   Sepsis secondary to UTI [facility])    Acute encephalopathy 06/19/2014   Tobacco abuse 06/19/2014   Crohn's disease ([place]) 06/12/2014   Pelvic mass 05/16/2014   Hypertension 52/77/8242   Diastolic CHF ([place]) 35/36/1443   Back pain, chronic 05/16/2014   OBSTRUCTIVE SLEEP APNEA 11/14/2008   Diabetes mellitus ([place]) 06/28/2007   NEUROPATHY 06/28/2007   ESOPHAGEAL STRICTURE 06/28/2007   HIATAL HERNIA 06/28/2007   POLYARTHRITIS 06/28/2007   PUD, HX OF 06/28/2007    Past Surgical History:  Procedure Laterality Date   ABDOMINAL HYSTERECTOMY     BLADDER SURGERY     CHOLECYSTECTOMY     ESOPHAGOGASTRODUODENOSCOPY N/A 07/08/2014   Procedure: ESOPHAGOGASTRODUODENOSCOPY (EGD);  Surgeon: Ladene Artist, MD;  Location: [facility] ENDOSCOPY;  Service: Endoscopy;  Laterality: N/A;   SKIN CANCER EXCISION     TONSILLECTOMY       OB History   No obstetric history on file.     Family History  Problem Relation Age of Onset   Ovarian cancer Maternal Grandmother    Stroke Mother    Stroke Brother     Social History  Tobacco Use   Smoking status: Former    Packs/day: 1.00    Years: 30.00    Pack years: 30.00    Types: Cigarettes    Quit date: 05/27/2014    Years since quitting: 6.4   Smokeless tobacco: Never  Vaping Use   Vaping Use: Never used  Substance Use Topics   Alcohol use: No   Drug use: No    Home Medications Prior to Admission medications   Medication Sig Start Date End Date Taking? Authorizing Provider  cephALEXin (KEFLEX) 500 MG capsule Take 1 capsule (500 mg total) by mouth 4 (four) times daily for 7 days. 11/21/20 11/28/20 Yes Sherman Donaldson A, PA-C  acetaminophen (TYLENOL) 325 MG tablet Take 325 mg by  mouth every 6 (six) hours as needed for mild pain or headache.    [provider]  atorvastatin (LIPITOR) 40 MG tablet Take 40 mg by mouth at bedtime.  08/16/16   [provider]  clopidogrel (PLAVIX) 75 MG tablet Take 1 tablet (75 mg total) by mouth daily. Patient taking differently: Take 75 mg by mouth in the morning.  09/14/14   Patrecia Pour, MD  hydrALAZINE (APRESOLINE) 25 MG tablet Take 25 mg by mouth See admin instructions. Take 25 mg by mouth at 8 AM, 25 mg at 2 PM, and 25 mg at 8 PM 03/16/15   [provider]  isosorbide mononitrate (IMDUR) 30 MG 24 hr tablet Take 30 mg by mouth in the morning.  04/14/15   [provider]  lacosamide (VIMPAT) 50 MG TABS tablet Take 4.5 tablets (225 mg total) by mouth in the morning. 06/22/19   Hosie Poisson, MD  lacosamide 150 MG TABS Take 2 tablets (300 mg total) by mouth at bedtime. 06/21/19   Hosie Poisson, MD  levETIRAcetam (KEPPRA) 1000 MG tablet Take 1 tablet (1,000 mg total) by mouth at bedtime. 06/21/19   Hosie Poisson, MD  levETIRAcetam (KEPPRA) 750 MG tablet Take 2 tablets (1,500 mg total) by mouth in the morning. 06/22/19   Hosie Poisson, MD    Allergies    Tape and Tramadol  Review of Systems   Review of Systems  Constitutional:  Negative for chills, fatigue and fever.  Eyes:  Negative for photophobia and visual disturbance.  Respiratory:  Negative for chest tightness and shortness of breath.   Cardiovascular:  Negative for chest pain and palpitations.  Gastrointestinal:  Negative for abdominal distention, diarrhea, nausea and vomiting.  Genitourinary:  Positive for dysuria and hematuria.  Musculoskeletal:  Positive for joint swelling (Left wrist).  Neurological:  Negative for light-headedness and headaches.  All other systems reviewed and are negative.  Physical Exam Updated Vital Signs BP (!) 163/104   Pulse (!) 45   Temp 98.1 F (36.7 C) (Axillary)   Resp 20   Ht 5' 6"  (1.676 m)   Wt 54.4 kg    SpO2 100%   BMI 19.36 kg/m   Physical Exam Vitals and nursing note reviewed.  Constitutional:      Appearance: Normal appearance.  HENT:     Head: Normocephalic and atraumatic.  Eyes:     General: No scleral icterus.    Conjunctiva/sclera: Conjunctivae normal.  Cardiovascular:     Rate and Rhythm: Normal rate and regular rhythm.  Pulmonary:     Effort: Pulmonary effort is normal. No respiratory distress.  Musculoskeletal:        General: No deformity or signs of injury. Normal range of motion.  Skin:  General: Skin is warm and dry.     Capillary Refill: Capillary refill takes less than 2 seconds.     Findings: No rash.  Neurological:     Mental Status: She is alert.  Psychiatric:        Mood and Affect: Mood normal.    ED Results / Procedures / Treatments   Labs (all labs ordered are listed, but only abnormal results are displayed) Labs Reviewed  URINALYSIS, ROUTINE W REFLEX MICROSCOPIC - Abnormal; Notable for the following components:      Result Value   Hgb urine dipstick LARGE (*)    Leukocytes,Ua MODERATE (*)    All other components within normal limits  COMPREHENSIVE METABOLIC PANEL - Abnormal; Notable for the following components:   Glucose, Bld 119 (*)    Creatinine, Ser 1.14 (*)    GFR, Estimated 49 (*)    All other components within normal limits  URINALYSIS, MICROSCOPIC (REFLEX) - Abnormal; Notable for the following components:   Bacteria, UA MANY (*)    All other components within normal limits  URINE CULTURE  CBC WITH DIFFERENTIAL/PLATELET    EKG EKG Interpretation  Date/Time:  Saturday November 21 2020 17:21:31 EDT Ventricular Rate:  51 PR Interval:  187 QRS Duration: 107 QT Interval:  495 QTC Calculation: 456 R Axis:   -43 Text Interpretation: Sinus rhythm Left axis deviation no acute ischemia Confirmed by Lorre Munroe ([id number]) on 11/21/2020 5:32:09 PM  Radiology DG Chest 2 View  Result Date: 11/21/2020 CLINICAL DATA:  Left anterior rib pain  after fall. EXAM: CHEST - 2 VIEW COMPARISON:  Chest x-ray dated November 01, 2020. FINDINGS: The heart size and mediastinal contours are within normal limits. Both lungs are clear. The visualized skeletal structures are unremarkable. IMPRESSION: No active cardiopulmonary disease. Electronically Signed   By: Titus Dubin M.D.   On: 11/21/2020 16:01   DG Wrist Complete Left  Result Date: 11/21/2020 CLINICAL DATA:  Fall. EXAM: LEFT WRIST - COMPLETE 3+ VIEW COMPARISON:  None. FINDINGS: No acute fracture or dislocation. Mild first CMC joint osteoarthritis. Osteopenia. Soft tissue swelling around the wrist. IMPRESSION: 1. Soft tissue swelling without acute osseous abnormality. Electronically Signed   By: Titus Dubin M.D.   On: 11/21/2020 16:05   CT HEAD WO CONTRAST (5MM)  Result Date: 11/21/2020 CLINICAL DATA:  Head injury after fall. EXAM: CT HEAD WITHOUT CONTRAST TECHNIQUE: Contiguous axial images were obtained from the base of the skull through the vertex without intravenous contrast. COMPARISON:  November 01, 2020. FINDINGS: Brain: Mild chronic ischemic white matter disease is noted. No mass effect or midline shift is noted. Ventricular size is within normal limits. There is no evidence of mass lesion, hemorrhage or acute infarction. Vascular: No hyperdense vessel or unexpected calcification. Skull: Normal. Negative for fracture or focal lesion. Sinuses/Orbits: No acute finding. Other: None. IMPRESSION: No acute intracranial abnormality seen. Electronically Signed   By: Marijo Conception M.D.   On: 11/21/2020 15:58   DG Shoulder Left Portable  Result Date: 11/21/2020 CLINICAL DATA:  Left shoulder pain after fall. EXAM: LEFT SHOULDER COMPARISON:  None. FINDINGS: No acute fracture or dislocation. Prior distal clavicle resection. Joint spaces are preserved. Soft tissues are unremarkable. IMPRESSION: 1. No acute osseous abnormality. Electronically Signed   By: Titus Dubin M.D.   On: 11/21/2020 16:04   DG  Hip Unilat W or Wo Pelvis 2-3 Views Left  Result Date: 11/21/2020 CLINICAL DATA:  Fall. EXAM: DG HIP (WITH OR WITHOUT PELVIS) 2-3V  LEFT COMPARISON:  CT abdomen pelvis dated July 07, 2014. FINDINGS: There is no evidence of hip fracture or dislocation. There is no evidence of arthropathy or other focal bone abnormality. Osteopenia. Soft tissues are unremarkable. IMPRESSION: Negative. Electronically Signed   By: Titus Dubin M.D.   On: 11/21/2020 16:06    Procedures Procedures   Medications Ordered in ED Medications  acetaminophen (TYLENOL) tablet 650 mg (650 mg Oral Given 11/21/20 1605)    ED Course  I have reviewed the triage vital signs and the nursing notes.  Discussed with MD right who also spoke with the patient.  Pertinent labs & imaging results that were available during my care of the patient were reviewed by me and considered in my medical decision making (see chart for details).    MDM Rules/Calculators/A&P Maxima Skelton is a 80 y.o. female with a past medical history of DM hypertension and seizure who presented today with a complaint of a fall.  Patient reports that she slipped this morning after getting out of the bathtub.  Says that she hit her head and has pain in her left shoulder, left wrist and left hip.    Patient was evaluated at the bedside in no acute distress.  Alert and oriented to the situation.  Granddaughter at bedside.  Because of patient's mechanism of injury and complaints of pain I obtained radiographs of her left shoulder, wrist, hip and CT scan of her head.  All of these revealed no fractures, bleeds or other abnormalities.  Patient's neuro exam completely normal.  5 out of 5 strength in all 4 extremities.  We discussed patient's previous seizures however I am not suspicious for a seizure this morning.  Patient remained alert and remembers the entirety of her fall.  Denies postictal state and called her granddaughter immediately after.  Patient confident that  this was a mechanical fall and history and physical exam corroborates this.  Patient to continue her Keppra.  Granddaughter was very aware of the patient's medical diagnoses and treatment.  We discussed her seizures extensively.  Also discussed the importance of preventing falls at home.  I will attach information to her discharge papers about the same.  We discussed return precautions to include LOC, visual changes, abnormal coordination or confusion.  Patient also noted to be bradycardic throughout her visit today.  Previous records show the same however I believe she should follow-up with her primary care provider to assure that this is benign and not a cause of her falls.  MD right discussed this with patient granddaughter.  The patient told me that she had on and off dysuria.  Originally denied hematuria however when ambulated to the bathroom with her granddaughter there was visible blood in her urine.  Urinalysis consistent with urinary tract infection.  I we will treat patient with 7 days of Keflex.  Instructed to return to her PCP if symptoms do not resolve.  Final Clinical Impression(s) / ED Diagnoses Final diagnoses:  Bradycardia  Fall in home, initial encounter    Rx / DC Orders ED Discharge Orders          Ordered    cephALEXin (KEFLEX) 500 MG capsule  4 times daily        11/21/20 1658          Results and diagnoses were explained to the patient. Return precautions discussed in full. Patient had no additional questions and expressed complete understanding.    Rhae Hammock, [place] 11/21/20 1857  Arnaldo Natal, MD 11/21/20 [phone]

## 2020-11-21 NOTE — ED Triage Notes (Signed)
Fall this morning, hit head no LOC, on plavix, c/o rib pain bilaterally and upper back pain

## 2020-11-21 NOTE — ED Notes (Signed)
Pt in XR. 

## 2020-11-21 NOTE — Discharge Instructions (Addendum)
All of your scans today are reassuring.  You have no fractures or bleeding in your brain.  You were found to have a urinary tract infection.  Antibiotics were sent to the pharmacy, please make sure to take all of the doses.  Your heart rate is low.  Follow-up with your primary care to evaluate your low heart rate.

## 2020-11-24 LAB — URINE CULTURE: Culture: 20000 — AB

## 2021-11-30 DIAGNOSIS — I4891 Unspecified atrial fibrillation: Secondary | ICD-10-CM

## 2021-12-10 ENCOUNTER — Encounter (HOSPITAL_COMMUNITY): Payer: Self-pay

## 2021-12-10 ENCOUNTER — Inpatient Hospital Stay (HOSPITAL_COMMUNITY)
Admit: 2021-12-10 | Discharge: 2021-12-16 | DRG: 864 | Disposition: A | Discharge status: Skilled Nursing Facility | Payer: Medicare Other | Source: Other Acute Inpatient Hospital | Attending: Internal Medicine | Admitting: Internal Medicine

## 2021-12-10 DIAGNOSIS — K920 Hematemesis: Secondary | ICD-10-CM

## 2021-12-10 DIAGNOSIS — M25511 Pain in right shoulder: Secondary | ICD-10-CM | POA: Diagnosis present

## 2021-12-10 DIAGNOSIS — E869 Volume depletion, unspecified: Secondary | ICD-10-CM | POA: Diagnosis present

## 2021-12-10 DIAGNOSIS — R3129 Other microscopic hematuria: Secondary | ICD-10-CM | POA: Diagnosis present

## 2021-12-10 DIAGNOSIS — G40909 Epilepsy, unspecified, not intractable, without status epilepticus: Secondary | ICD-10-CM

## 2021-12-10 DIAGNOSIS — K219 Gastro-esophageal reflux disease without esophagitis: Secondary | ICD-10-CM | POA: Diagnosis present

## 2021-12-10 DIAGNOSIS — R509 Fever, unspecified: Secondary | ICD-10-CM | POA: Diagnosis present

## 2021-12-10 DIAGNOSIS — Z950 Presence of cardiac pacemaker: Secondary | ICD-10-CM

## 2021-12-10 DIAGNOSIS — Z7902 Long term (current) use of antithrombotics/antiplatelets: Secondary | ICD-10-CM

## 2021-12-10 DIAGNOSIS — I130 Hypertensive heart and chronic kidney disease with heart failure and stage 1 through stage 4 chronic kidney disease, or unspecified chronic kidney disease: Secondary | ICD-10-CM | POA: Diagnosis present

## 2021-12-10 DIAGNOSIS — T360X5A Adverse effect of penicillins, initial encounter: Secondary | ICD-10-CM | POA: Diagnosis present

## 2021-12-10 DIAGNOSIS — D500 Iron deficiency anemia secondary to blood loss (chronic): Secondary | ICD-10-CM | POA: Diagnosis present

## 2021-12-10 DIAGNOSIS — I509 Heart failure, unspecified: Secondary | ICD-10-CM | POA: Diagnosis present

## 2021-12-10 DIAGNOSIS — K2289 Other specified disease of esophagus: Secondary | ICD-10-CM | POA: Diagnosis not present

## 2021-12-10 DIAGNOSIS — A4151 Sepsis due to Escherichia coli [E. coli]: Principal | ICD-10-CM | POA: Diagnosis present

## 2021-12-10 DIAGNOSIS — E119 Type 2 diabetes mellitus without complications: Secondary | ICD-10-CM | POA: Diagnosis not present

## 2021-12-10 DIAGNOSIS — R502 Drug induced fever: Secondary | ICD-10-CM | POA: Diagnosis present

## 2021-12-10 DIAGNOSIS — N179 Acute kidney failure, unspecified: Principal | ICD-10-CM | POA: Diagnosis present

## 2021-12-10 DIAGNOSIS — G8929 Other chronic pain: Secondary | ICD-10-CM | POA: Diagnosis present

## 2021-12-10 DIAGNOSIS — R112 Nausea with vomiting, unspecified: Secondary | ICD-10-CM

## 2021-12-10 DIAGNOSIS — Z8719 Personal history of other diseases of the digestive system: Secondary | ICD-10-CM

## 2021-12-10 DIAGNOSIS — K449 Diaphragmatic hernia without obstruction or gangrene: Secondary | ICD-10-CM | POA: Diagnosis not present

## 2021-12-10 DIAGNOSIS — I69398 Other sequelae of cerebral infarction: Secondary | ICD-10-CM | POA: Diagnosis not present

## 2021-12-10 DIAGNOSIS — Z9049 Acquired absence of other specified parts of digestive tract: Secondary | ICD-10-CM

## 2021-12-10 DIAGNOSIS — K2960 Other gastritis without bleeding: Secondary | ICD-10-CM | POA: Diagnosis present

## 2021-12-10 DIAGNOSIS — G4733 Obstructive sleep apnea (adult) (pediatric): Secondary | ICD-10-CM | POA: Diagnosis present

## 2021-12-10 DIAGNOSIS — K2211 Ulcer of esophagus with bleeding: Secondary | ICD-10-CM | POA: Diagnosis present

## 2021-12-10 DIAGNOSIS — Z90710 Acquired absence of both cervix and uterus: Secondary | ICD-10-CM

## 2021-12-10 DIAGNOSIS — R6520 Severe sepsis without septic shock: Secondary | ICD-10-CM | POA: Diagnosis present

## 2021-12-10 DIAGNOSIS — Z885 Allergy status to narcotic agent status: Secondary | ICD-10-CM

## 2021-12-10 DIAGNOSIS — Z9109 Other allergy status, other than to drugs and biological substances: Secondary | ICD-10-CM

## 2021-12-10 DIAGNOSIS — K5090 Crohn's disease, unspecified, without complications: Secondary | ICD-10-CM | POA: Diagnosis present

## 2021-12-10 DIAGNOSIS — Z20822 Contact with and (suspected) exposure to covid-19: Secondary | ICD-10-CM | POA: Diagnosis present

## 2021-12-10 DIAGNOSIS — E785 Hyperlipidemia, unspecified: Secondary | ICD-10-CM | POA: Diagnosis present

## 2021-12-10 DIAGNOSIS — J449 Chronic obstructive pulmonary disease, unspecified: Secondary | ICD-10-CM | POA: Diagnosis present

## 2021-12-10 DIAGNOSIS — M25512 Pain in left shoulder: Secondary | ICD-10-CM | POA: Diagnosis present

## 2021-12-10 DIAGNOSIS — Z87891 Personal history of nicotine dependence: Secondary | ICD-10-CM | POA: Diagnosis not present

## 2021-12-10 DIAGNOSIS — Z823 Family history of stroke: Secondary | ICD-10-CM

## 2021-12-10 DIAGNOSIS — D649 Anemia, unspecified: Secondary | ICD-10-CM | POA: Diagnosis not present

## 2021-12-10 DIAGNOSIS — E872 Acidosis, unspecified: Secondary | ICD-10-CM | POA: Diagnosis present

## 2021-12-10 DIAGNOSIS — N170 Acute kidney failure with tubular necrosis: Secondary | ICD-10-CM | POA: Diagnosis present

## 2021-12-10 DIAGNOSIS — D631 Anemia in chronic kidney disease: Secondary | ICD-10-CM | POA: Diagnosis present

## 2021-12-10 DIAGNOSIS — Z8711 Personal history of peptic ulcer disease: Secondary | ICD-10-CM

## 2021-12-10 DIAGNOSIS — K208 Other esophagitis without bleeding: Secondary | ICD-10-CM | POA: Diagnosis not present

## 2021-12-10 DIAGNOSIS — K3189 Other diseases of stomach and duodenum: Secondary | ICD-10-CM | POA: Diagnosis not present

## 2021-12-10 DIAGNOSIS — M25559 Pain in unspecified hip: Secondary | ICD-10-CM | POA: Diagnosis present

## 2021-12-10 DIAGNOSIS — N1831 Chronic kidney disease, stage 3a: Secondary | ICD-10-CM | POA: Diagnosis present

## 2021-12-10 DIAGNOSIS — Z85828 Personal history of other malignant neoplasm of skin: Secondary | ICD-10-CM

## 2021-12-10 DIAGNOSIS — T361X5A Adverse effect of cephalosporins and other beta-lactam antibiotics, initial encounter: Secondary | ICD-10-CM | POA: Diagnosis present

## 2021-12-10 DIAGNOSIS — Z79899 Other long term (current) drug therapy: Secondary | ICD-10-CM

## 2021-12-10 DIAGNOSIS — E1122 Type 2 diabetes mellitus with diabetic chronic kidney disease: Secondary | ICD-10-CM | POA: Diagnosis present

## 2021-12-10 DIAGNOSIS — M549 Dorsalgia, unspecified: Secondary | ICD-10-CM | POA: Diagnosis present

## 2021-12-10 DIAGNOSIS — M1990 Unspecified osteoarthritis, unspecified site: Secondary | ICD-10-CM | POA: Diagnosis present

## 2021-12-10 DIAGNOSIS — N390 Urinary tract infection, site not specified: Secondary | ICD-10-CM | POA: Diagnosis present

## 2021-12-11 ENCOUNTER — Encounter (HOSPITAL_COMMUNITY): Payer: Self-pay | Admitting: Internal Medicine

## 2021-12-11 DIAGNOSIS — I69398 Other sequelae of cerebral infarction: Secondary | ICD-10-CM

## 2021-12-11 DIAGNOSIS — R509 Fever, unspecified: Secondary | ICD-10-CM | POA: Diagnosis not present

## 2021-12-11 DIAGNOSIS — E119 Type 2 diabetes mellitus without complications: Secondary | ICD-10-CM

## 2021-12-11 DIAGNOSIS — G40909 Epilepsy, unspecified, not intractable, without status epilepticus: Secondary | ICD-10-CM

## 2021-12-11 DIAGNOSIS — N179 Acute kidney failure, unspecified: Secondary | ICD-10-CM

## 2021-12-11 LAB — CBC WITH DIFFERENTIAL/PLATELET
Abs Immature Granulocytes: 0.37 10*3/uL — ABNORMAL HIGH (ref 0.00–0.07)
Basophils Absolute: 0.1 10*3/uL (ref 0.0–0.1)
Basophils Relative: 0 %
Eosinophils Absolute: 0.1 10*3/uL (ref 0.0–0.5)
Eosinophils Relative: 0 %
HCT: 25.5 % — ABNORMAL LOW (ref 36.0–46.0)
Hemoglobin: 8.8 g/dL — ABNORMAL LOW (ref 12.0–15.0)
Immature Granulocytes: 2 %
Lymphocytes Relative: 7 %
Lymphs Abs: 1.6 10*3/uL (ref 0.7–4.0)
MCH: 30.2 pg (ref 26.0–34.0)
MCHC: 34.5 g/dL (ref 30.0–36.0)
MCV: 87.6 fL (ref 80.0–100.0)
Monocytes Absolute: 1.6 10*3/uL — ABNORMAL HIGH (ref 0.1–1.0)
Monocytes Relative: 7 %
Neutro Abs: 20.5 10*3/uL — ABNORMAL HIGH (ref 1.7–7.7)
Neutrophils Relative %: 84 %
Platelets: 365 10*3/uL (ref 150–400)
RBC: 2.91 MIL/uL — ABNORMAL LOW (ref 3.87–5.11)
RDW: 16.7 % — ABNORMAL HIGH (ref 11.5–15.5)
WBC: 24.2 10*3/uL — ABNORMAL HIGH (ref 4.0–10.5)
nRBC: 0 % (ref 0.0–0.2)

## 2021-12-11 LAB — COMPREHENSIVE METABOLIC PANEL
ALT: 34 U/L (ref 0–44)
AST: 46 U/L — ABNORMAL HIGH (ref 15–41)
Albumin: 2 g/dL — ABNORMAL LOW (ref 3.5–5.0)
Alkaline Phosphatase: 74 U/L (ref 38–126)
Anion gap: 7 (ref 5–15)
BUN: 33 mg/dL — ABNORMAL HIGH (ref 8–23)
CO2: 17 mmol/L — ABNORMAL LOW (ref 22–32)
Calcium: 9.2 mg/dL (ref 8.9–10.3)
Chloride: 112 mmol/L — ABNORMAL HIGH (ref 98–111)
Creatinine, Ser: 2.38 mg/dL — ABNORMAL HIGH (ref 0.44–1.00)
GFR, Estimated: 20 mL/min — ABNORMAL LOW (ref >60)
Glucose, Bld: 145 mg/dL — ABNORMAL HIGH (ref 70–99)
Potassium: 4.3 mmol/L (ref 3.5–5.1)
Sodium: 136 mmol/L (ref 135–145)
Total Bilirubin: 0.7 mg/dL (ref 0.3–1.2)
Total Protein: 6.5 g/dL (ref 6.5–8.1)

## 2021-12-11 LAB — URINALYSIS, COMPLETE (UACMP) WITH MICROSCOPIC
Bilirubin Urine: NEGATIVE
Glucose, UA: NEGATIVE mg/dL
Ketones, ur: NEGATIVE mg/dL
Nitrite: NEGATIVE
Protein, ur: 30 mg/dL — AB
Specific Gravity, Urine: 1.011 (ref 1.005–1.030)
WBC, UA: >50 WBC/hpf — ABNORMAL HIGH (ref 0–5)
pH: 5 (ref 5.0–8.0)

## 2021-12-11 LAB — PROTIME-INR
INR: 1.1 (ref 0.8–1.2)
Prothrombin Time: 14.1 seconds (ref 11.4–15.2)

## 2021-12-11 LAB — HEMOGLOBIN A1C
Hgb A1c MFr Bld: 7.4 % — ABNORMAL HIGH (ref 4.8–5.6)
Mean Plasma Glucose: 165.68 mg/dL

## 2021-12-11 LAB — GLUCOSE, CAPILLARY
Glucose-Capillary: 128 mg/dL — ABNORMAL HIGH (ref 70–99)
Glucose-Capillary: 179 mg/dL — ABNORMAL HIGH (ref 70–99)
Glucose-Capillary: 199 mg/dL — ABNORMAL HIGH (ref 70–99)
Glucose-Capillary: 184 mg/dL — ABNORMAL HIGH (ref 70–99)

## 2021-12-11 LAB — PROCALCITONIN: Procalcitonin: 1.12 ng/mL

## 2021-12-11 LAB — TSH: TSH: 1.067 u[IU]/mL (ref 0.350–4.500)

## 2021-12-11 LAB — PHOSPHORUS: Phosphorus: 3.8 mg/dL (ref 2.5–4.6)

## 2021-12-11 LAB — APTT: aPTT: 23 seconds — ABNORMAL LOW (ref 24–36)

## 2021-12-11 LAB — MAGNESIUM: Magnesium: 2 mg/dL (ref 1.7–2.4)

## 2021-12-11 MED ORDER — ACETAMINOPHEN 650 MG RE SUPP: 650.0000 mg | Freq: Four times a day (QID) | RECTAL | Status: DC | PRN

## 2021-12-11 MED ORDER — ATORVASTATIN CALCIUM 40 MG PO TABS
40.0000 mg | ORAL_TABLET | Freq: Every day | ORAL | Status: DC
  Administered 2021-12-11 – 2021-12-15 (×5): 40 mg via ORAL
  Filled 2021-12-11 (×7): qty 1

## 2021-12-11 MED ORDER — ONDANSETRON 4 MG PO TBDP
4.0000 mg | ORAL_TABLET | Freq: Four times a day (QID) | ORAL | Status: DC | PRN
  Administered 2021-12-11: 4 mg via ORAL
  Filled 2021-12-11: qty 1

## 2021-12-11 MED ORDER — ONDANSETRON HCL 4 MG/2ML IJ SOLN
4.0000 mg | Freq: Four times a day (QID) | INTRAMUSCULAR | Status: DC | PRN
  Filled 2021-12-11 (×2): qty 2

## 2021-12-11 MED ORDER — HYDRALAZINE HCL 50 MG PO TABS
50.0000 mg | ORAL_TABLET | Freq: Three times a day (TID) | ORAL | Status: DC
  Administered 2021-12-11 – 2021-12-16 (×13): 50 mg via ORAL
  Filled 2021-12-11 (×13): qty 1

## 2021-12-11 MED ORDER — SERTRALINE HCL 25 MG PO TABS
25.0000 mg | ORAL_TABLET | Freq: Every day | ORAL | Status: DC
  Administered 2021-12-11 – 2021-12-16 (×4): 25 mg via ORAL
  Filled 2021-12-11 (×4): qty 1

## 2021-12-11 MED ORDER — ISOSORBIDE MONONITRATE ER 30 MG PO TB24
30.0000 mg | ORAL_TABLET | Freq: Every morning | ORAL | Status: DC
  Administered 2021-12-12 – 2021-12-16 (×3): 30 mg via ORAL
  Filled 2021-12-11 (×3): qty 1

## 2021-12-11 MED ORDER — INSULIN ASPART 100 UNIT/ML IJ SOLN
0.0000 [IU] | Freq: Three times a day (TID) | INTRAMUSCULAR | Status: DC
  Administered 2021-12-11: 3 [IU] via SUBCUTANEOUS
  Administered 2021-12-11: 2 [IU] via SUBCUTANEOUS
  Administered 2021-12-11 – 2021-12-15 (×6): 3 [IU] via SUBCUTANEOUS
  Administered 2021-12-16: 2 [IU] via SUBCUTANEOUS

## 2021-12-11 MED ORDER — FLUTICASONE PROPIONATE 50 MCG/ACT NA SUSP
1.0000 | Freq: Every day | NASAL | Status: DC
  Administered 2021-12-12 – 2021-12-16 (×6): 1 via NASAL
  Filled 2021-12-11: qty 16

## 2021-12-11 MED ORDER — ATORVASTATIN CALCIUM 40 MG PO TABS: 40.0000 mg | ORAL_TABLET | Freq: Every day | ORAL | Status: DC

## 2021-12-11 MED ORDER — PIPERACILLIN-TAZOBACTAM IN DEX 2-0.25 GM/50ML IV SOLN
2.2500 g | Freq: Three times a day (TID) | INTRAVENOUS | Status: DC
  Filled 2021-12-11: qty 50

## 2021-12-11 MED ORDER — PIPERACILLIN-TAZOBACTAM IN DEX 2-0.25 GM/50ML IV SOLN
2.2500 g | INTRAVENOUS | Status: AC
  Administered 2021-12-11: 2.25 g via INTRAVENOUS
  Filled 2021-12-11: qty 50

## 2021-12-11 MED ORDER — LEVETIRACETAM 500 MG PO TABS: 1500.0000 mg | ORAL_TABLET | Freq: Two times a day (BID) | ORAL | Status: DC

## 2021-12-11 MED ORDER — LACOSAMIDE 50 MG PO TABS
300.0000 mg | ORAL_TABLET | Freq: Every evening | ORAL | Status: DC
  Administered 2021-12-11 – 2021-12-16 (×6): 300 mg via ORAL
  Filled 2021-12-11 (×6): qty 6

## 2021-12-11 MED ORDER — INSULIN ASPART 100 UNIT/ML IJ SOLN: 0.0000 [IU] | Freq: Every day | INTRAMUSCULAR | Status: DC

## 2021-12-11 MED ORDER — LACOSAMIDE 50 MG PO TABS
225.0000 mg | ORAL_TABLET | Freq: Two times a day (BID) | ORAL | Status: DC
  Administered 2021-12-11: 225 mg via ORAL
  Filled 2021-12-11: qty 5

## 2021-12-11 MED ORDER — ONDANSETRON HCL 4 MG/2ML IJ SOLN
4.0000 mg | Freq: Four times a day (QID) | INTRAMUSCULAR | Status: DC | PRN
  Administered 2021-12-12: 4 mg via INTRAVENOUS

## 2021-12-11 MED ORDER — LACOSAMIDE 10 MG/ML PO SOLN: 225.0000 mg | Freq: Every day | ORAL | Status: DC

## 2021-12-11 MED ORDER — LACOSAMIDE 10 MG/ML PO SOLN: 225.0000 mg | Freq: Two times a day (BID) | ORAL | Status: DC

## 2021-12-11 MED ORDER — ACETAMINOPHEN 325 MG PO TABS
650.0000 mg | ORAL_TABLET | Freq: Four times a day (QID) | ORAL | Status: DC | PRN
  Administered 2021-12-12 – 2021-12-16 (×5): 650 mg via ORAL
  Filled 2021-12-11 (×5): qty 2

## 2021-12-11 MED ORDER — SODIUM CHLORIDE 0.9 % IV SOLN: INTRAVENOUS | Status: DC

## 2021-12-11 MED ORDER — LACOSAMIDE 50 MG PO TABS: 300.0000 mg | ORAL_TABLET | Freq: Every day | ORAL | Status: DC

## 2021-12-11 MED ORDER — LEVETIRACETAM 500 MG PO TABS
500.0000 mg | ORAL_TABLET | Freq: Two times a day (BID) | ORAL | Status: DC
  Administered 2021-12-11: 500 mg via ORAL
  Filled 2021-12-11: qty 1

## 2021-12-11 MED ORDER — HEPARIN SODIUM (PORCINE) 5000 UNIT/ML IJ SOLN
5000.0000 [IU] | Freq: Three times a day (TID) | INTRAMUSCULAR | Status: DC
  Administered 2021-12-11 – 2021-12-16 (×15): 5000 [IU] via SUBCUTANEOUS
  Filled 2021-12-11 (×17): qty 1

## 2021-12-11 MED ORDER — LEVETIRACETAM 500 MG PO TABS
750.0000 mg | ORAL_TABLET | Freq: Two times a day (BID) | ORAL | Status: DC
  Administered 2021-12-11 – 2021-12-16 (×9): 750 mg via ORAL
  Filled 2021-12-11 (×11): qty 1

## 2021-12-11 MED ORDER — LEVETIRACETAM 250 MG PO TABS
250.0000 mg | ORAL_TABLET | Freq: Once | ORAL | Status: AC
  Administered 2021-12-11: 250 mg via ORAL
  Filled 2021-12-11: qty 1

## 2021-12-11 MED ORDER — CLOPIDOGREL BISULFATE 75 MG PO TABS: 75.0000 mg | ORAL_TABLET | Freq: Every day | ORAL | Status: DC

## 2021-12-11 MED ORDER — LACOSAMIDE 50 MG PO TABS
225.0000 mg | ORAL_TABLET | Freq: Every morning | ORAL | Status: DC
  Administered 2021-12-12 – 2021-12-16 (×4): 225 mg via ORAL
  Filled 2021-12-11 (×4): qty 5

## 2021-12-11 MED ORDER — ONDANSETRON HCL 4 MG PO TABS: 4.0000 mg | ORAL_TABLET | Freq: Four times a day (QID) | ORAL | Status: DC | PRN

## 2021-12-11 NOTE — Assessment & Plan Note (Signed)
Mod scale SSI AC/HS for the moment. May need to add long acting as her BGLs were running quite high at [facility] during admit.

## 2021-12-11 NOTE — Progress Notes (Signed)
Pharmacy Antibiotic Note  Michelle Farrell is a 81 y.o. female transferred from [facility] on 12/10/2021 with AKI, persistent fevers, E.coli bacteremia/UTI. Pharmacy has been consulted for Zosyn dosing.  Plan: Zosyn 2.25g IV q8h for CrCl < 65m/min Monitor renal function, cultures, clinical course, ID recommendations  Height: 5' 6"  (167.6 cm) Weight: 57.7 kg (127 lb 3.3 oz) IBW/kg (Calculated) : 59.3  Temp (24hrs), Avg:98.5 F (36.9 C), Min:98.5 F (36.9 C), Max:98.5 F (36.9 C)  Recent Labs  Lab 12/11/21 0044  WBC 24.2*  CREATININE 2.38*    Estimated Creatinine Clearance: 17.5 mL/min (A) (by C-G formula based on SCr of 2.38 mg/dL (H)).    Allergies  Allergen Reactions   Tape Other (See Comments)    NO PLASTIC TAPE- Bubbles the skin!!   Tramadol Other (See Comments)    Seizures     Antimicrobials this admission: 9/16 Zosyn continued from outside hospital >>   Microbiology results: 9/16 BCx:  9/16 UCx:     Thank you for allowing pharmacy to be a part of this patient's care.   JLindell Spar PharmD, BCPS Clinical Pharmacist  12/11/2021 01:29 AM

## 2021-12-11 NOTE — H&P (Signed)
History and Physical    Patient: Michelle Farrell DGL:[id number] DOB: 1940-06-13 DOA: 12/10/2021 DOS: the patient was seen and examined on 12/11/2021 PCP: Doretha Sou, MD  Patient coming from: Outside Hospital  Chief Complaint: AKI, persistent fever  HPI: Michelle Farrell is a 81 y.o. female with medical history significant of Seizure disorder post stroke, HTN, HLD.  Pt presented to and admitted to [facility] on 9/5 for confusion secondary to sepsis from UTI and E.Coli bacteremia.  Initially treated with zosyn before ABx narrowed to rocephin after BCx came back with pan-sensitive E.Coli.  Unfortunately she developed persistent fevers despite this, ABx changed back to zosyn on 9/13.  After this, no more recurrent fevers until today 9/15 it seems.  She also had AKI with creat initially 1.9 (1.2-1.3 baseline), this had been improving on IVF until 9/11, after which point it began to worsen again.  Creat 2.3 as of earlier today prior to transfer.  Mental status: initially somnolent, though had improved over past few days in hospital.  WBC trending up during hospital stay before finally starting to come down from 35k on 9/13 after restarting empiric zosyn.  Abd discomfort yesterday 9/14.  Couple of episodes NBNB vomiting today 9/15.  CT AP on 9/12 = no acute findings  Renal US 9/13 = no obstruction.  Flu neg, RVP neg, COVID neg.  Pt transferred to [facility] for ID consult for persistent fevers and higher level of care.  Review of Systems: As mentioned in the history of present illness. All other systems reviewed and are negative. Past Medical History:  Diagnosis Date   AKI (acute kidney injury) ([place])    Arthritis    Back pain, chronic    CHF (congestive heart failure) (HCC)    CKD (chronic kidney disease)    COPD (chronic obstructive pulmonary disease) ([place])    Crohn disease ([place])    Diabetes mellitus without complication ([place])    Esophageal stricture    s/p dilation 2003, 2008   GERD  (gastroesophageal reflux disease)    Hip pain, chronic    Hypertension    PUD (peptic ulcer disease)    Seizures (HCC)    Sleep apnea    Stroke [facility])    Past Surgical History:  Procedure Laterality Date   ABDOMINAL HYSTERECTOMY     BLADDER SURGERY     CHOLECYSTECTOMY     ESOPHAGOGASTRODUODENOSCOPY N/A 07/08/2014   Procedure: ESOPHAGOGASTRODUODENOSCOPY (EGD);  Surgeon: Ladene Artist, MD;  Location: [facility] ENDOSCOPY;  Service: Endoscopy;  Laterality: N/A;   SKIN CANCER EXCISION     TONSILLECTOMY     Social History:  reports that she quit smoking about 7 years ago. Her smoking use included cigarettes. She has a 30.00 pack-year smoking history. She has never used smokeless tobacco. She reports that she does not drink alcohol and does not use drugs.  Allergies  Allergen Reactions   Tape Other (See Comments)    NO PLASTIC TAPE- Bubbles the skin!!   Tramadol Other (See Comments)    Seizures     Family History  Problem Relation Age of Onset   Ovarian cancer Maternal Grandmother    Stroke Mother    Stroke Brother     Prior to Admission medications   Medication Sig Start Date End Date Taking? Authorizing Provider  acetaminophen (TYLENOL) 325 MG tablet Take 325 mg by mouth every 6 (six) hours as needed for mild pain or headache.    [provider]  atorvastatin (LIPITOR) 40 MG tablet Take  40 mg by mouth at bedtime.  08/16/16   [provider]  clopidogrel (PLAVIX) 75 MG tablet Take 1 tablet (75 mg total) by mouth daily. Patient taking differently: Take 75 mg by mouth in the morning.  09/14/14   Patrecia Pour, MD  hydrALAZINE (APRESOLINE) 25 MG tablet Take 25 mg by mouth See admin instructions. Take 25 mg by mouth at 8 AM, 25 mg at 2 PM, and 25 mg at 8 PM 03/16/15   [provider]  isosorbide mononitrate (IMDUR) 30 MG 24 hr tablet Take 30 mg by mouth in the morning.  04/14/15   [provider]  lacosamide (VIMPAT) 50 MG TABS tablet Take 4.5 tablets (225  mg total) by mouth in the morning. 06/22/19   Hosie Poisson, MD  lacosamide 150 MG TABS Take 2 tablets (300 mg total) by mouth at bedtime. 06/21/19   Hosie Poisson, MD  levETIRAcetam (KEPPRA) 1000 MG tablet Take 1 tablet (1,000 mg total) by mouth at bedtime. 06/21/19   Hosie Poisson, MD  levETIRAcetam (KEPPRA) 750 MG tablet Take 2 tablets (1,500 mg total) by mouth in the morning. 06/22/19   Hosie Poisson, MD    Physical Exam: Vitals:   12/10/21 2140  BP: 134/68  Pulse: 70  Resp: 18  Temp: 98.5 F (36.9 C)  TempSrc: Oral  Weight: 57.7 kg  Height: 5' 6"  (1.676 m)   Constitutional: NAD, calm, comfortable, sleepy but 1AM Eyes: PERRL, lids and conjunctivae normal ENMT: Mucous membranes are moist. Posterior pharynx clear of any exudate or lesions.Normal dentition.  Neck: normal, supple, no masses, no thyromegaly Respiratory: clear to auscultation bilaterally, no wheezing, no crackles. Normal respiratory effort. No accessory muscle use.  Cardiovascular: Regular rate and rhythm, no murmurs / rubs / gallops. No extremity edema. 2+ pedal pulses. No carotid bruits.  Abdomen: no tenderness, no masses palpated. No hepatosplenomegaly. Bowel sounds positive.  Musculoskeletal: no clubbing / cyanosis. No joint deformity upper and lower extremities. Good ROM, no contractures. Normal muscle tone.  Skin: no rashes, lesions, ulcers. No induration Neurologic: CN 2-12 grossly intact. Sensation intact, DTR normal. Strength 5/5 in all 4.  Psychiatric: Normal judgment and insight. Alert and oriented x 3. Normal mood.   Data Reviewed:  Results are pending, will review when available.  Assessment and Plan: * Persistent fever Unclear cause. Pan-sensitive E.Coli UTI and bacteremia treated during 10 day stay at [facility]. Covid, flu, and RVP neg Having N/V, having soft stool but nothing that looks or smells like C.Diff, not liquid stool. CT AP neg on 9/12. Initially seemed to do better on zosyn after 9/13 with  decreasing WBCs but then recurrent fever again today. Repeat UCx, BCx Ordering repeat labs Will leave on empiric zosyn for the moment Needs ID consult in AM.  AKI (acute kidney injury) ([place]) AKI initially had improved after IVF at [facility], but then started to worsen again, Creat now up to 2.3 today (baseline ~1.2-1.3). Gentle IVF Strict intake and output UA Repeating CMP now Likely warrants nephrology consult in AM here given progressively worsening renal fxn despite treatment at OSH.  Seizure disorder as sequela of cerebrovascular accident [facility]) Looks like they continued full dose of her home meds despite persistent / worsening AKI at [facility]. Reordered seizure meds at current dosing for the moment Put in pharm consult to potentially renally adjust seizure meds for AKI  Diabetes mellitus ([place]) Mod scale SSI AC/HS for the moment. May need to add long acting as her BGLs were running  quite high at [facility] during admit.      Advance Care Planning:   Code Status: Full Code  Consults: none at this time, call ID and nephrology for consults in AM  Family Communication: Daughter at bedside  Severity of Illness: The appropriate patient status for this patient is INPATIENT. Inpatient status is judged to be reasonable and necessary in order to provide the required intensity of service to ensure the patient's safety. The patient's presenting symptoms, physical exam findings, and initial radiographic and laboratory data in the context of their chronic comorbidities is felt to place them at high risk for further clinical deterioration. Furthermore, it is not anticipated that the patient will be medically stable for discharge from the hospital within 2 midnights of admission.   * I certify that at the point of admission it is my clinical judgment that the patient will require inpatient hospital care spanning beyond 2 midnights from the point of admission due to high intensity of service, high risk for further  deterioration and high frequency of surveillance required.*  Author: Etta Quill., DO 12/11/2021 12:30 AM  For on call review [email or web address].

## 2021-12-11 NOTE — Progress Notes (Signed)
Pharm is renally adjusting seizure meds for AKI.  We have curbsided neurology prior to any changes.  They do recommend adjusting dose for AKI (especially as her current home doses would now be over the maximum dose given AKI).  Per Dr. Lorrin Goodell: "Can do 500 Keppra bid given GFR of 20 and for Vimpat 445m total daily divided into BID (so 2272mBID). Will need readjustment as GFR improves."

## 2021-12-11 NOTE — Assessment & Plan Note (Signed)
Looks like they continued full dose of her home meds despite persistent / worsening AKI at [facility] 1. Reordered seizure meds at current dosing for the moment 2. Put in pharm consult to potentially renally adjust seizure meds for AKI

## 2021-12-11 NOTE — Progress Notes (Signed)
PROGRESS NOTE    Michelle Farrell  BLT:[id number] DOB: Aug 13, 1940 DOA: 12/10/2021 PCP: Doretha Sou, MD    Brief Narrative:  81 year old with history of seizure disorder on Keppra and Vimpat history of stroke, ambulatory dysfunction, hypertension, sinus bradycardia status post pacemaker, hyperlipidemia admitted to [facility] 9/5, admission to [facility] with confusion and found to have E. coli UTI, E. coli bacteremia.  Initially treated with Zosyn then converted to Rocephin.  Cultures were pansensitive.  She continued to have intermittent fever as well not recovered fully. Patient developed acute kidney injury, baseline creatinine 1.2-1.3, presented with creatinine of 1.9 and today it is 2.3 report transferred from [place]. WBC count went up to 35 K. CT scan abdomen pelvis on 9/12, no acute findings Renal ultrasound on 9/13, no obstruction Flow, RVP, COVID-negative. Family requested transfer to [facility] for infection and kidney function work-up.   Assessment & Plan:   E. coli bacteremia, E. coli UTI.  Questionable persistent fever.  Possible persistent infection or drug fever. Patient had completed 10 days of Zosyn and Rocephin.  Today she is afebrile. Discontinue all antibiotics Repeat urine culture and blood cultures to be collected Maintenance IV fluids Recent CT scan abdomen pelvis and renal ultrasound were negative for any acute findings or hydronephrosis. Monitor fever curve, if recurrent fever repeat culture and CT scan abdomen pelvis to repeat.  Acute kidney injury with underlying history of chronic kidney disease stage III AA Previously known creatinine of 1.2-1.3.  Suffered ATN due to sepsis.  No evidence of uremia at this time. Continue maintenance IV fluids.  Intake output monitoring.  We will recheck levels after IV fluid resuscitation, if no appropriate response, will consult nephrology.  History of seizure with history of CVA: No  evidence of new seizure.  Adjust seizure medicine with renal functions. On Keppra 1500 mg twice daily at home-decrease dose to 7:30 50 mg twice daily On Vimpat 225 mg in a.m., 300 mg in p.m., literature suggested no adjustment of doses needed for decreased GFR.  Continue close monitoring.  Type 2 diabetes: Not on treatment at home.  Checking sliding scale insulin.  Sinus bradycardia: Status post pacemaker.  MRSA.  Follows with cardiologist.  DVT prophylaxis: heparin injection 5,000 Units Start: 12/11/21 0600   Code Status: Full code Family Communication: Patient's granddaughter who is also healthcare power of attorney at the bedside. Disposition Plan: Status is: Inpatient Remains inpatient appropriate because: Leukocytosis, AKI     Consultants:  None  Procedures:  None  Antimicrobials:  Zosyn 1 dose given, will hold further   Subjective: Patient seen and examined.  Mostly sleepy.  Appropriately responds.  Flat affect.  Afebrile since arrival to the emergency room.  Denies any nausea vomiting.  She denies any abdominal pain.  Did not report any diarrhea followed by abdominal cramping. Granddaughter at the bedside, detailed reports reviewed, earlier records were reviewed.  She had kept a very nice note of chronology of events and we reviewed that together.  Objective: Vitals:   12/10/21 2140 12/11/21 0136 12/11/21 0540 12/11/21 0940  BP: 134/68 (!) 156/77 (!) 143/57 (!) 144/53  Pulse: 70 60 (!) 59 62  Resp: 18 18 18    Temp: 98.5 F (36.9 C) 98.5 F (36.9 C) 98.2 F (36.8 C)   TempSrc: Oral Oral Oral   SpO2:  97% 99% 100%  Weight: 57.7 kg     Height: 5' 6"  (1.676 m)       Intake/Output Summary (Last 24 hours) at  12/11/2021 1338 Last data filed at 12/11/2021 0800 Gross per 24 hour  Intake 50 ml  Output 550 ml  Net -500 ml   Filed Weights   12/10/21 2140  Weight: 57.7 kg    Examination:  General exam: Appears calm and comfortable  Chronically sick looking and  debilitated.  Not in any distress.  Looks fairly comfortable, keeps her eyes closed. Respiratory system: Clear to auscultation.  No added sounds. Cardiovascular system: S1 & S2 heard, RRR.  Pacemaker left precordium.  No pedal edema. Gastrointestinal system: Abdomen is nondistended, soft and nontender. No organomegaly or masses felt. Normal bowel sounds heard. Central nervous system: Alert and oriented.  Flat affect.  Slow response.  Mostly sleepy.  Moves all extremities however right upper extremity is appreciatively weaker than left.  She also has some swelling of the extremities.    Data Reviewed: I have personally reviewed following labs and imaging studies  CBC: Recent Labs  Lab 12/11/21 0044  WBC 24.2*  NEUTROABS 20.5*  HGB 8.8*  HCT 25.5*  MCV 87.6  PLT 710   Basic Metabolic Panel: Recent Labs  Lab 12/11/21 0044  NA 136  K 4.3  CL 112*  CO2 17*  GLUCOSE 145*  BUN 33*  CREATININE 2.38*  CALCIUM 9.2  MG 2.0  PHOS 3.8   GFR: Estimated Creatinine Clearance: 17.5 mL/min (A) (by C-G formula based on SCr of 2.38 mg/dL (H)). Liver Function Tests: Recent Labs  Lab 12/11/21 0044  AST 46*  ALT 34  ALKPHOS 74  BILITOT 0.7  PROT 6.5  ALBUMIN 2.0*   No results for input(s): "LIPASE", "AMYLASE" in the last 168 hours. No results for input(s): "AMMONIA" in the last 168 hours. Coagulation Profile: Recent Labs  Lab 12/11/21 0044  INR 1.1   Cardiac Enzymes: No results for input(s): "CKTOTAL", "CKMB", "CKMBINDEX", "TROPONINI" in the last 168 hours. BNP (last 3 results) No results for input(s): "PROBNP" in the last 8760 hours. HbA1C: Recent Labs    12/11/21 0044  HGBA1C 7.4*   CBG: Recent Labs  Lab 12/11/21 0756  GLUCAP 128*   Lipid Profile: No results for input(s): "CHOL", "HDL", "LDLCALC", "TRIG", "CHOLHDL", "LDLDIRECT" in the last 72 hours. Thyroid Function Tests: Recent Labs    12/11/21 0044  TSH 1.067   Anemia Panel: No results for input(s):  "VITAMINB12", "FOLATE", "FERRITIN", "TIBC", "IRON", "RETICCTPCT" in the last 72 hours. Sepsis Labs: Recent Labs  Lab 12/11/21 0044  PROCALCITON 1.12    Recent Results (from the past 240 hour(s))  Culture, blood (Routine X 2) w Reflex to ID Panel     Status: None (Preliminary result)   Collection Time: 12/11/21 12:44 AM   Specimen: Right Antecubital; Blood  Result Value Ref Range Status   Specimen Description   Final    RIGHT ANTECUBITAL BLOOD Performed at [facility], [place] [address], [place], [place] [address]    Special Requests   Final    BOTTLES DRAWN AEROBIC AND ANAEROBIC Blood Culture adequate volume Performed at [facility] [address], [place], [place] [address]    Culture   Final    NO GROWTH < 12 HOURS Performed at [facility] [address], [place], [place] [address]    Report Status PENDING  Incomplete  Culture, blood (Routine X 2) w Reflex to ID Panel     Status: None (Preliminary result)   Collection Time: 12/11/21 12:48 AM   Specimen: BLOOD RIGHT HAND  Result  Value Ref Range Status   Specimen Description   Final    BLOOD RIGHT HAND Performed at [facility] [address], [place], [place] [address]    Special Requests   Final    BOTTLES DRAWN AEROBIC ONLY Blood Culture adequate volume Performed at [facility] [address], [place], [place] [address]    Culture   Final    NO GROWTH < 12 HOURS Performed at [facility] [address], [place], [place] [address]    Report Status PENDING  Incomplete         Radiology Studies: No results found.      Scheduled Meds:  atorvastatin  40 mg Oral Daily   heparin  5,000 Units Subcutaneous Q8H   insulin aspart  0-15 Units Subcutaneous TID WC   insulin aspart  0-5 Units Subcutaneous QHS   [START ON 12/12/2021] lacosamide  225 mg Oral q morning   lacosamide  300 mg Oral QPM   levETIRAcetam  750 mg Oral BID    Continuous Infusions:  sodium chloride 100 mL/hr at 12/11/21 1100     LOS: 1 day    Time spent: Additional 30 minutes    Barb Merino, MD [facility] Pager [phone]

## 2021-12-11 NOTE — Assessment & Plan Note (Signed)
AKI initially had improved after IVF at [facility], but then started to worsen again, Creat now up to 2.3 today (baseline ~1.2-1.3). 1. Gentle IVF 2. Strict intake and output 3. UA 4. Repeating CMP now 5. Likely warrants nephrology consult in AM here given progressively worsening renal fxn despite treatment at OSH.

## 2021-12-11 NOTE — Assessment & Plan Note (Signed)
Unclear cause. Pan-sensitive E.Coli UTI and bacteremia treated during 10 day stay at [facility]. Covid, flu, and RVP neg Having N/V, having soft stool but nothing that looks or smells like C.Diff, not liquid stool. CT AP neg on 9/12. Initially seemed to do better on zosyn after 9/13 with decreasing WBCs but then recurrent fever again today. 1. Repeat UCx, BCx 2. Ordering repeat labs 3. Will leave on empiric zosyn for the moment 4. Needs ID consult in AM.

## 2021-12-12 DIAGNOSIS — R509 Fever, unspecified: Secondary | ICD-10-CM | POA: Diagnosis not present

## 2021-12-12 LAB — COMPREHENSIVE METABOLIC PANEL
ALT: 34 U/L (ref 0–44)
AST: 34 U/L (ref 15–41)
Albumin: 2 g/dL — ABNORMAL LOW (ref 3.5–5.0)
Alkaline Phosphatase: 66 U/L (ref 38–126)
Anion gap: 7 (ref 5–15)
BUN: 29 mg/dL — ABNORMAL HIGH (ref 8–23)
CO2: 16 mmol/L — ABNORMAL LOW (ref 22–32)
Calcium: 9 mg/dL (ref 8.9–10.3)
Chloride: 111 mmol/L (ref 98–111)
Creatinine, Ser: 2.44 mg/dL — ABNORMAL HIGH (ref 0.44–1.00)
GFR, Estimated: 20 mL/min — ABNORMAL LOW (ref >60)
Glucose, Bld: 166 mg/dL — ABNORMAL HIGH (ref 70–99)
Potassium: 3.9 mmol/L (ref 3.5–5.1)
Sodium: 134 mmol/L — ABNORMAL LOW (ref 135–145)
Total Bilirubin: 0.4 mg/dL (ref 0.3–1.2)
Total Protein: 6.3 g/dL — ABNORMAL LOW (ref 6.5–8.1)

## 2021-12-12 LAB — IRON AND TIBC
Iron: 40 ug/dL (ref 28–170)
Saturation Ratios: 27 % (ref 10.4–31.8)
TIBC: 149 ug/dL — ABNORMAL LOW (ref 250–450)
UIBC: 109 ug/dL

## 2021-12-12 LAB — CBC WITH DIFFERENTIAL/PLATELET
Abs Immature Granulocytes: 0.31 10*3/uL — ABNORMAL HIGH (ref 0.00–0.07)
Basophils Absolute: 0.1 10*3/uL (ref 0.0–0.1)
Basophils Relative: 0 %
Eosinophils Absolute: 0.1 10*3/uL (ref 0.0–0.5)
Eosinophils Relative: 0 %
HCT: 25.3 % — ABNORMAL LOW (ref 36.0–46.0)
Hemoglobin: 8.5 g/dL — ABNORMAL LOW (ref 12.0–15.0)
Immature Granulocytes: 2 %
Lymphocytes Relative: 11 %
Lymphs Abs: 1.9 10*3/uL (ref 0.7–4.0)
MCH: 30.1 pg (ref 26.0–34.0)
MCHC: 33.6 g/dL (ref 30.0–36.0)
MCV: 89.7 fL (ref 80.0–100.0)
Monocytes Absolute: 1.4 10*3/uL — ABNORMAL HIGH (ref 0.1–1.0)
Monocytes Relative: 8 %
Neutro Abs: 13.5 10*3/uL — ABNORMAL HIGH (ref 1.7–7.7)
Neutrophils Relative %: 79 %
Platelets: 389 10*3/uL (ref 150–400)
RBC: 2.82 MIL/uL — ABNORMAL LOW (ref 3.87–5.11)
RDW: 17.2 % — ABNORMAL HIGH (ref 11.5–15.5)
WBC: 17.3 10*3/uL — ABNORMAL HIGH (ref 4.0–10.5)
nRBC: 0 % (ref 0.0–0.2)

## 2021-12-12 LAB — GLUCOSE, CAPILLARY
Glucose-Capillary: 158 mg/dL — ABNORMAL HIGH (ref 70–99)
Glucose-Capillary: 161 mg/dL — ABNORMAL HIGH (ref 70–99)
Glucose-Capillary: 80 mg/dL (ref 70–99)
Glucose-Capillary: 143 mg/dL — ABNORMAL HIGH (ref 70–99)

## 2021-12-12 LAB — URINE CULTURE: Culture: NO GROWTH

## 2021-12-12 LAB — PROCALCITONIN: Procalcitonin: 0.64 ng/mL

## 2021-12-12 LAB — FOLATE: Folate: 7.9 ng/mL (ref >5.9)

## 2021-12-12 LAB — PHOSPHORUS: Phosphorus: 3.3 mg/dL (ref 2.5–4.6)

## 2021-12-12 LAB — MAGNESIUM: Magnesium: 1.9 mg/dL (ref 1.7–2.4)

## 2021-12-12 LAB — VITAMIN B12: Vitamin B-12: 520 pg/mL (ref 180–914)

## 2021-12-12 LAB — FERRITIN: Ferritin: 658 ng/mL — ABNORMAL HIGH (ref 11–307)

## 2021-12-12 MED ORDER — PANTOPRAZOLE SODIUM 20 MG PO TBEC
20.0000 mg | DELAYED_RELEASE_TABLET | Freq: Two times a day (BID) | ORAL | Status: DC
  Administered 2021-12-12 – 2021-12-13 (×3): 20 mg via ORAL
  Filled 2021-12-12 (×5): qty 1

## 2021-12-12 NOTE — Evaluation (Signed)
Physical Therapy Evaluation Patient Details Name: Michelle Farrell MRN: [id number] DOB: 12-Sep-1940 Today's Date: 12/12/2021  History of Present Illness  Patinet is a 81 year old female who was admitted to the hospital from ALF with E coli bacteremia UTI, persistent fever, and AKI. PMH: seizure disorder, ambulatory dysfunction, HTN, sinus bradycardia s/p pacemaker, hyperlipidemia, h/o CVA, DM II  Clinical Impression  Patient evaluated by Physical Therapy with no further acute PT needs identified. All education has been completed and the patient has no further questions.  No family present at time of eval, difficult to get a clear baseline, pt states she amb with rollator. Pt is much weaker than her baseline.  Recommend SNF   See below for any follow-up Physical Therapy or equipment needs. PT is signing off. Thank you for this referral.        Recommendations for follow up therapy are one component of a multi-disciplinary discharge planning process, led by the attending physician.  Recommendations may be updated based on patient status, additional functional criteria and insurance authorization.  Follow Up Recommendations Skilled nursing-short term rehab (<3 hours/day) Can patient physically be transported by private vehicle: No    Assistance Recommended at Discharge    Patient can return home with the following       Equipment Recommendations None recommended by PT  Recommendations for Other Services       Functional Status Assessment Patient has had a recent decline in their functional status and demonstrates the ability to make significant improvements in function in a reasonable and predictable amount of time.     Precautions / Restrictions        Mobility  Bed Mobility Overal bed mobility: Needs Assistance Bed Mobility: Supine to Sit, Sit to Supine     Supine to sit: Min assist Sit to supine: Min assist, Mod assist   General bed mobility comments: assist to elevate trunk  and to lift LEs on to bed    Transfers     Transfers: Sit to/from Stand Sit to Stand: Mod assist, Max assist           General transfer comment: unable to come to full stand    Ambulation/Gait                  Stairs            Wheelchair Mobility    Modified Rankin (Stroke Patients Only)       Balance                                             Pertinent Vitals/Pain Pain Assessment Pain Assessment: No/denies pain    Home Living Family/patient expects to be discharged to:: Skilled nursing facility                   Additional Comments: [facility]. family in room reported that patient is able to compelte all tasks herself at [facility]. noted that patient needs supervision when up as she has had falls in the past.---info from OT note, no family in room at time of PT eval    Prior Function               Mobility Comments: pt reports she was amb with rollator       Hand Dominance        Extremity/Trunk  Assessment   Upper Extremity Assessment Upper Extremity Assessment: Defer to OT evaluation    Lower Extremity Assessment Lower Extremity Assessment: Generalized weakness       Communication   Communication: No difficulties  Cognition Arousal/Alertness: Awake/alert Behavior During Therapy: WFL for tasks assessed/performed Overall Cognitive Status: No family/caregiver present to determine baseline cognitive functioning                                          General Comments      Exercises     Assessment/Plan    PT Assessment Patient needs continued PT services  PT Problem List Decreased strength;Decreased mobility;Decreased activity tolerance;Decreased balance;Decreased knowledge of use of DME       PT Treatment Interventions DME instruction;Therapeutic exercise;Gait training;Functional mobility training;Therapeutic activities;Patient/family education    PT Goals  (Current goals can be found in the Care Plan section)  Acute Rehab PT Goals Patient Stated Goal: pt does not state PT Goal Formulation: With patient Time For Goal Achievement: 12/26/21 Potential to Achieve Goals: Good    Frequency Min 2X/week     Co-evaluation               AM-PAC PT "6 Clicks" Mobility  Outcome Measure Help needed turning from your back to your side while in a flat bed without using bedrails?: A Lot Help needed moving from lying on your back to sitting on the side of a flat bed without using bedrails?: A Lot Help needed moving to and from a bed to a chair (including a wheelchair)?: A Lot Help needed standing up from a chair using your arms (e.g., wheelchair or bedside chair)?: Total Help needed to walk in hospital room?: Total Help needed climbing 3-5 steps with a railing? : Total 6 Click Score: 9    End of Session Equipment Utilized During Treatment: Gait belt Activity Tolerance: Patient tolerated treatment well Patient left: with call bell/phone within reach;in bed;with bed alarm set   PT Visit Diagnosis: Difficulty in walking, not elsewhere classified (R26.2)    Time: 7672-0947 PT Time Calculation (min) (ACUTE ONLY): 12 min   Charges:   PT Evaluation $PT Eval Low Complexity: [place], PT  Acute Rehab Dept [facility]) [phone]  [facility] Weekend Pager [facility] only)  [phone]  12/12/2021   [facility] 12/12/2021, 4:33 PM

## 2021-12-12 NOTE — Progress Notes (Signed)
PROGRESS NOTE    Michelle Farrell  MHD:[id number] DOB: January 03, 1941 DOA: 12/10/2021 PCP: Doretha Sou, MD    Brief Narrative:  81 year old with history of seizure disorder on Keppra and Vimpat history of stroke, ambulatory dysfunction, hypertension, sinus bradycardia status post pacemaker, hyperlipidemia admitted to [facility] 9/5, admission to [facility] with confusion and found to have E. coli UTI, E. coli bacteremia.  Initially treated with Zosyn then converted to Rocephin.  Cultures were pansensitive.  She continued to have intermittent fever as well not recovered fully. Patient developed acute kidney injury, baseline creatinine 1.2-1.3, presented with creatinine of 1.9 and today it is 2.3 report transferred from [place]. WBC count went up to 35 K. CT scan abdomen pelvis on 9/12, no acute findings Renal ultrasound on 9/13, no obstruction Flow, RVP, COVID-negative. Family requested transfer to [facility] for infection and kidney function work-up.   Assessment & Plan:   E. coli bacteremia, E. coli UTI.  Questionable persistent fever.  Possible persistent infection or drug fever. Patient had completed 10 days of Zosyn and Rocephin.  Monitoring off antibiotics.  Afebrile last 24 hours. Repeat urine cultures and blood cultures pending, negative so far. Maintenance IV fluids Recent CT scan abdomen pelvis and renal ultrasound were negative for any acute findings or hydronephrosis. Monitor fever curve, if recurrent fever repeat culture and CT scan abdomen pelvis to repeat. WBC and procalcitonin trending down.  Acute kidney injury with underlying history of chronic kidney disease stage IIIa Previously known creatinine of 1.2-1.3.  Suffered ATN due to sepsis.  No evidence of uremia at this time. Continue maintenance IV fluids.  Intake output monitoring.  Recheck levels tomorrow morning.  History of seizure with history of CVA: No evidence of new seizure.   Adjust seizure medicine with renal functions. On Keppra 1500 mg twice daily at home-decrease dose to 7:30 50 mg twice daily On Vimpat 225 mg in a.m., 300 mg in p.m., literature suggested no adjustment of doses needed for decreased GFR.  Continue close monitoring.  Type 2 diabetes: Not on treatment at home.  Checking sliding scale insulin.  Sinus bradycardia: Status post pacemaker.  Stable. Follows with cardiologist.  Anemia of chronic disease: Acute on chronic anemia.  Slowly drifting down hemoglobin but is stable.  We will check iron levels, Q11 and folic acid levels.  History of GERD with vomiting: Family reported tinge of blood with vomitus.  Will start PPI.  If recurrent, will need upper GI endoscopy.  Work with PT OT.  Refer to SNF.  DVT prophylaxis: heparin injection 5,000 Units Start: 12/11/21 0600   Code Status: Full code Family Communication: Patient's granddaughter who is also healthcare power of attorney at the bedside. Disposition Plan: Status is: Inpatient Remains inpatient appropriate because: Leukocytosis, AKI     Consultants:  None  Procedures:  None  Antimicrobials:  Zosyn 1 dose 9/16.   Subjective:  Patient seen and examined.  Working with physical therapy.  Granddaughter at the bedside.  Patient is more awake alert interactive. Granddaughter thinks her mental status is almost back to normal self but very weak.  Will refer to a SNF. Remains afebrile last 24 hours.  Patient herself as no complaints. [place] daughter reported that she had blood mixed vomitus, not reported by nursing staff.  Objective: Vitals:   12/11/21 0940 12/11/21 1350 12/11/21 1917 12/12/21 0519  BP: (!) 144/53 (!) 116/53 (!) 163/64 (!) 172/62  Pulse: 62 (!) 59 62 (!) 59  Resp:  18 20 16  Temp:  98.6 F (37 C) 98.6 F (37 C) 98.9 F (37.2 C)  TempSrc:  Oral Oral Oral  SpO2: 100% 100% 100% 100%  Weight:      Height:        Intake/Output Summary (Last 24 hours) at 12/12/2021  1037 Last data filed at 12/12/2021 0737 Gross per 24 hour  Intake 1873.97 ml  Output 550 ml  Net 1323.97 ml   Filed Weights   12/10/21 2140  Weight: 57.7 kg    Examination:  General exam: Appears calm and comfortable  Today she is fairly interactive.  Trying to work with physical therapy.  She is not very steady on her feet. Chronically sick looking and debilitated.   Respiratory system: Clear to auscultation.  No added sounds. Cardiovascular system: S1 & S2 heard, RRR.  Pacemaker left precordium.  No pedal edema. Gastrointestinal system: Abdomen is nondistended, soft and nontender. No organomegaly or masses felt. Normal bowel sounds heard. Central nervous system: Alert and oriented.  Flat affect.  Slow response.  Moves all extremities equally.   Data Reviewed: I have personally reviewed following labs and imaging studies  CBC: Recent Labs  Lab 12/11/21 0044 12/12/21 0539  WBC 24.2* 17.3*  NEUTROABS 20.5* 13.5*  HGB 8.8* 8.5*  HCT 25.5* 25.3*  MCV 87.6 89.7  PLT 365 786   Basic Metabolic Panel: Recent Labs  Lab 12/11/21 0044 12/12/21 0539  NA 136 134*  K 4.3 3.9  CL 112* 111  CO2 17* 16*  GLUCOSE 145* 166*  BUN 33* 29*  CREATININE 2.38* 2.44*  CALCIUM 9.2 9.0  MG 2.0 1.9  PHOS 3.8 3.3   GFR: Estimated Creatinine Clearance: 17 mL/min (A) (by C-G formula based on SCr of 2.44 mg/dL (H)). Liver Function Tests: Recent Labs  Lab 12/11/21 0044 12/12/21 0539  AST 46* 34  ALT 34 34  ALKPHOS 74 66  BILITOT 0.7 0.4  PROT 6.5 6.3*  ALBUMIN 2.0* 2.0*   No results for input(s): "LIPASE", "AMYLASE" in the last 168 hours. No results for input(s): "AMMONIA" in the last 168 hours. Coagulation Profile: Recent Labs  Lab 12/11/21 0044  INR 1.1   Cardiac Enzymes: No results for input(s): "CKTOTAL", "CKMB", "CKMBINDEX", "TROPONINI" in the last 168 hours. BNP (last 3 results) No results for input(s): "PROBNP" in the last 8760 hours. HbA1C: Recent Labs     12/11/21 0044  HGBA1C 7.4*   CBG: Recent Labs  Lab 12/11/21 0756 12/11/21 1208 12/11/21 1611 12/11/21 2128 12/12/21 0731  GLUCAP 128* 179* 199* 184* 158*   Lipid Profile: No results for input(s): "CHOL", "HDL", "LDLCALC", "TRIG", "CHOLHDL", "LDLDIRECT" in the last 72 hours. Thyroid Function Tests: Recent Labs    12/11/21 0044  TSH 1.067   Anemia Panel: No results for input(s): "VITAMINB12", "FOLATE", "FERRITIN", "TIBC", "IRON", "RETICCTPCT" in the last 72 hours. Sepsis Labs: Recent Labs  Lab 12/11/21 0044 12/12/21 0539  PROCALCITON 1.12 0.64    Recent Results (from the past 240 hour(s))  Culture, blood (Routine X 2) w Reflex to ID Panel     Status: None (Preliminary result)   Collection Time: 12/11/21 12:44 AM   Specimen: Right Antecubital; Blood  Result Value Ref Range Status   Specimen Description   Final    RIGHT ANTECUBITAL BLOOD Performed at [facility], [place] [address], [place], [place] [address]    Special Requests   Final    BOTTLES DRAWN AEROBIC AND ANAEROBIC Blood Culture adequate volume Performed at [facility], [place] [address], [place], [place] [address]    Culture   Final    NO GROWTH 1 DAY Performed at [facility], [place] [address], [place], [place] [address]    Report Status PENDING  Incomplete  Culture, blood (Routine X 2) w Reflex to ID Panel     Status: None (Preliminary result)   Collection Time: 12/11/21 12:48 AM   Specimen: BLOOD RIGHT HAND  Result Value Ref Range Status   Specimen Description   Final    BLOOD RIGHT HAND Performed at [facility] [address] [place], [place] [address]    Special Requests   Final    BOTTLES DRAWN AEROBIC ONLY Blood Culture adequate volume Performed at [facility] [address] [place], [place] [address]    Culture   Final    NO GROWTH 1 DAY Performed at [facility], [place] [address], [place], [place] [address]     Report Status PENDING  Incomplete  Urine Culture     Status: None   Collection Time: 12/11/21  5:22 AM   Specimen: Urine, Clean Catch  Result Value Ref Range Status   Specimen Description   Final    URINE, CLEAN CATCH Performed at [facility], [place] [address], [place], [place] [address]    Special Requests   Final    NONE Performed at [facility], [place] [address] [place], [place] [address]    Culture   Final    NO GROWTH Performed at [facility], [place] [address] [place], [place] [address]    Report Status 12/12/2021 FINAL  Final         Radiology Studies: No results found.      Scheduled Meds:  atorvastatin  40 mg Oral Daily   fluticasone  1 spray Each Nare Daily   heparin  5,000 Units Subcutaneous Q8H   hydrALAZINE  50 mg Oral TID   insulin aspart  0-15 Units Subcutaneous TID WC   insulin aspart  0-5 Units Subcutaneous QHS   isosorbide mononitrate  30 mg Oral q AM   lacosamide  225 mg Oral q morning   lacosamide  300 mg Oral QPM   levETIRAcetam  750 mg Oral BID   pantoprazole  20 mg Oral BID   sertraline  25 mg Oral Daily   Continuous Infusions:  sodium chloride 100 mL/hr at 12/12/21 0923     LOS: 2 days    Time spent: 35 minutes    Barb Merino, MD [facility] Pager [phone]

## 2021-12-12 NOTE — Evaluation (Signed)
Occupational Therapy Evaluation Patient Details Name: Michelle Farrell MRN: [id number] DOB: December 25, 1940 Today's Date: 12/12/2021   History of Present Illness Patinet is a 81 year old female who was admitted to the hospital from ALF with E coli bacteremia UTI, persistent fever, and AKI. PMH: seizure disorder, ambulatory dysfunction, HTN, sinus bradycardia s/p pacemaker, hyperlipidemia, h/o CVA, DM II   Clinical Impression   Patient is a 81 year old female who was admitted for above. Family in room reported that [facility] lives [facility] but is able to complete ADLs herself at rollator level with staff supervision for ambulation.  Patient was mod A to transfer from edge of bed to recliner in room with TD for incontinence episode on this date. Patient was noted to be quick to fatigue. Patient was noted to have decreased functional activity tolerance, decreased endurance, decreased standing balance, decreased safety awareness, and decreased knowledge of AD/AE impacting participation in ADLs.  Patient would continue to benefit from skilled OT services at this time while admitted and after d/c to address noted deficits in order to improve overall safety and independence in ADLs.       Recommendations for follow up therapy are one component of a multi-disciplinary discharge planning process, led by the attending physician.  Recommendations may be updated based on patient status, additional functional criteria and insurance authorization.   Follow Up Recommendations  Skilled nursing-short term rehab (<3 hours/day)    Assistance Recommended at Discharge Frequent or constant Supervision/Assistance  Patient can return home with the following A lot of help with bathing/dressing/bathroom;A lot of help with walking and/or transfers;Assistance with cooking/housework;Direct supervision/assist for financial management;Assist for transportation;Help with stairs or ramp for entrance;Direct supervision/assist for  medications management    Functional Status Assessment  Patient has had a recent decline in their functional status and demonstrates the ability to make significant improvements in function in a reasonable and predictable amount of time.  Equipment Recommendations  Other (comment) (defer to next venue)    Recommendations for Other Services       Precautions / Restrictions Precautions Precautions: Fall Restrictions Weight Bearing Restrictions: No      Mobility Bed Mobility Overal bed mobility: Needs Assistance Bed Mobility: Supine to Sit     Supine to sit: Mod assist, HOB elevated          Transfers                          Balance Overall balance assessment: Needs assistance Sitting-balance support: Feet supported, Single extremity supported Sitting balance-Leahy Scale: Fair     Standing balance support: Reliant on assistive device for balance, Bilateral upper extremity supported, During functional activity Standing balance-Leahy Scale: Poor                             ADL either performed or assessed with clinical judgement   ADL Overall ADL's : Needs assistance/impaired Eating/Feeding: Set up;Sitting   Grooming: Wash/dry face;Wash/dry hands;Set up;Sitting Grooming Details (indicate cue type and reason): in recliner in room Upper Body Bathing: Minimal assistance;Sitting   Lower Body Bathing: Maximal assistance;Sit to/from stand;Sitting/lateral leans Lower Body Bathing Details (indicate cue type and reason): unable to bring BLE into lap Upper Body Dressing : Minimal assistance;Sitting   Lower Body Dressing: Maximal assistance;Sit to/from stand;Sitting/lateral leans   Toilet Transfer: Moderate assistance;Rolling walker (2 wheels);Stand-pivot Armed forces technical officer Details (indicate cue type and reason): patient was noted  to push RW further from self with very wide base of support. patient reported using rollator at baseline. patient noted to  have fowrad trunk flexion when staning. patient needed mod A for physical assist and to keep walker close to complete turn to recliner in room. patient needed increase time to complete task with family reporting that this is not patients baseline. Toileting- Clothing Manipulation and Hygiene: Sit to/from stand;Maximal assistance Toileting - Clothing Manipulation Details (indicate cue type and reason): with foward flexion of trunk. patient appeared to not be aware of BM incontinence with need for BUE support to maintain standing.             Vision Patient Visual Report: No change from baseline       Perception     Praxis      Pertinent Vitals/Pain Pain Assessment Pain Assessment: No/denies pain     Hand Dominance Right   Extremity/Trunk Assessment Upper Extremity Assessment Upper Extremity Assessment: Generalized weakness (noted to be able to reach up to head with increased abduction. patient reported no h.o shoulder injuries.)   Lower Extremity Assessment Lower Extremity Assessment: Defer to PT evaluation   Cervical / Trunk Assessment Cervical / Trunk Assessment: Kyphotic   Communication Communication Communication: No difficulties   Cognition Arousal/Alertness: Awake/alert Behavior During Therapy: WFL for tasks assessed/performed Overall Cognitive Status: Difficult to assess                                 General Comments: patient was noted to have moments of poor safety awareness and decreased following commands (reaching back for chair). MD came into room to speak with family as well during session     General Comments       Exercises     Shoulder Instructions      Home Living Family/patient expects to be discharged to:: Skilled nursing facility                                 Additional Comments: patient lives at [facility]. family in room reported that patient is able to compelte all tasks herself at [facility]. noted that patient  needs supervision when up as she has had falls in the past.      Prior Functioning/Environment                 ADLs Comments: supervision for ADLs to prevent falls        OT Problem List: Decreased activity tolerance;Decreased coordination;Cardiopulmonary status limiting activity;Decreased knowledge of precautions;Decreased safety awareness;Decreased knowledge of use of DME or AE;Pain      OT Treatment/Interventions: Self-care/ADL training;Therapeutic exercise;Neuromuscular education;Energy conservation;DME and/or AE instruction;Therapeutic activities;Balance training;Patient/family education    OT Goals(Current goals can be found in the care plan section) Acute Rehab OT Goals Patient Stated Goal: to get better OT Goal Formulation: With patient/family Time For Goal Achievement: 12/26/21 Potential to Achieve Goals: Fair  OT Frequency: Min 2X/week    Co-evaluation              AM-PAC OT "6 Clicks" Daily Activity     Outcome Measure Help from another person eating meals?: A Little Help from another person taking care of personal grooming?: A Little Help from another person toileting, which includes using toliet, bedpan, or urinal?: A Lot Help from another person bathing (including washing, rinsing, drying)?: A Lot Help from  another person to put on and taking off regular upper body clothing?: A Little Help from another person to put on and taking off regular lower body clothing?: A Lot 6 Click Score: 15   End of Session Equipment Utilized During Treatment: Gait belt;Rolling walker (2 wheels) Nurse Communication: Other (comment) (pure wic request from family and IV beeping)  Activity Tolerance: Patient tolerated treatment well Patient left: in chair;with call bell/phone within reach;with chair alarm set;with family/visitor present  OT Visit Diagnosis: Unsteadiness on feet (R26.81);Other abnormalities of gait and mobility (R26.89);Muscle weakness (generalized) (M62.81)                 Time: 7445-1460 OT Time Calculation (min): 28 min Charges:  OT General Charges $OT Visit: 1 Visit OT Evaluation $OT Eval Moderate Complexity: 1 Mod OT Treatments $Self Care/Home Management : 8-22 mins  Rennie Plowman, MS Acute Rehabilitation Department Office# [phone]   Marcellina Millin 12/12/2021, 12:26 PM

## 2021-12-13 DIAGNOSIS — Z8719 Personal history of other diseases of the digestive system: Secondary | ICD-10-CM

## 2021-12-13 DIAGNOSIS — R112 Nausea with vomiting, unspecified: Secondary | ICD-10-CM

## 2021-12-13 DIAGNOSIS — R509 Fever, unspecified: Secondary | ICD-10-CM | POA: Diagnosis not present

## 2021-12-13 LAB — CBC WITH DIFFERENTIAL/PLATELET
Abs Immature Granulocytes: 0.22 10*3/uL — ABNORMAL HIGH (ref 0.00–0.07)
Basophils Absolute: 0 10*3/uL (ref 0.0–0.1)
Basophils Relative: 0 %
Eosinophils Absolute: 0.1 10*3/uL (ref 0.0–0.5)
Eosinophils Relative: 1 %
HCT: 21 % — ABNORMAL LOW (ref 36.0–46.0)
Hemoglobin: 7 g/dL — ABNORMAL LOW (ref 12.0–15.0)
Immature Granulocytes: 2 %
Lymphocytes Relative: 13 %
Lymphs Abs: 1.5 10*3/uL (ref 0.7–4.0)
MCH: 29.9 pg (ref 26.0–34.0)
MCHC: 33.3 g/dL (ref 30.0–36.0)
MCV: 89.7 fL (ref 80.0–100.0)
Monocytes Absolute: 0.8 10*3/uL (ref 0.1–1.0)
Monocytes Relative: 7 %
Neutro Abs: 9 10*3/uL — ABNORMAL HIGH (ref 1.7–7.7)
Neutrophils Relative %: 77 %
Platelets: 358 10*3/uL (ref 150–400)
RBC: 2.34 MIL/uL — ABNORMAL LOW (ref 3.87–5.11)
RDW: 17.3 % — ABNORMAL HIGH (ref 11.5–15.5)
WBC: 11.7 10*3/uL — ABNORMAL HIGH (ref 4.0–10.5)
nRBC: 0 % (ref 0.0–0.2)

## 2021-12-13 LAB — GLUCOSE, CAPILLARY
Glucose-Capillary: 117 mg/dL — ABNORMAL HIGH (ref 70–99)
Glucose-Capillary: 117 mg/dL — ABNORMAL HIGH (ref 70–99)
Glucose-Capillary: 125 mg/dL — ABNORMAL HIGH (ref 70–99)
Glucose-Capillary: 161 mg/dL — ABNORMAL HIGH (ref 70–99)
Glucose-Capillary: 122 mg/dL — ABNORMAL HIGH (ref 70–99)

## 2021-12-13 LAB — BASIC METABOLIC PANEL
Anion gap: 6 (ref 5–15)
BUN: 23 mg/dL (ref 8–23)
CO2: 14 mmol/L — ABNORMAL LOW (ref 22–32)
Calcium: 8.2 mg/dL — ABNORMAL LOW (ref 8.9–10.3)
Chloride: 120 mmol/L — ABNORMAL HIGH (ref 98–111)
Creatinine, Ser: 2.44 mg/dL — ABNORMAL HIGH (ref 0.44–1.00)
GFR, Estimated: 20 mL/min — ABNORMAL LOW (ref >60)
Glucose, Bld: 120 mg/dL — ABNORMAL HIGH (ref 70–99)
Potassium: 3.9 mmol/L (ref 3.5–5.1)
Sodium: 140 mmol/L (ref 135–145)

## 2021-12-13 LAB — ABO/RH: ABO/RH(D): O POS

## 2021-12-13 LAB — PROCALCITONIN: Procalcitonin: 0.36 ng/mL

## 2021-12-13 LAB — PREPARE RBC (CROSSMATCH)

## 2021-12-13 MED ORDER — SODIUM CHLORIDE 0.9% IV SOLUTION: Freq: Once | INTRAVENOUS | Status: AC

## 2021-12-13 MED ORDER — SODIUM BICARBONATE 650 MG PO TABS
650.0000 mg | ORAL_TABLET | Freq: Three times a day (TID) | ORAL | Status: DC
  Administered 2021-12-13 – 2021-12-14 (×4): 650 mg via ORAL
  Filled 2021-12-13 (×4): qty 1

## 2021-12-13 NOTE — Progress Notes (Signed)
PROGRESS NOTE    Michelle Farrell  SVX:[id number] DOB: [birth year] DOA: 12/10/2021 PCP: Doretha Sou, MD    Brief Narrative:  81 year old with history of seizure disorder on Keppra and Vimpat , history of stroke, ambulatory dysfunction, hypertension, sinus bradycardia status post pacemaker, hyperlipidemia admitted to [facility] since 9/5  with confusion and found to have E. coli UTI, E. coli bacteremia.  Initially treated with Zosyn then converted to Rocephin.  Cultures were pansensitive.  She continued to have intermittent fever as well not recovered fully. Patient developed acute kidney injury, baseline creatinine 1.2-1.3, presented with creatinine of 1.9 and today it is 2.3 report transferred from [place]. WBC count went up to 35 K. CT scan abdomen pelvis on 9/12, no acute findings, fluid filled esophagus. Renal ultrasound on 9/13, no obstruction Flu, RVP, COVID-negative. Family requested transfer to [facility] for infection and kidney function work-up.   Assessment & Plan:   E. coli bacteremia, E. coli UTI.  Completely treated.  She was likely having drug fever.   WBC count normalized.  Procalcitonin trending down.   Afebrile for 72 hours off antibiotics.  Repeat urine cultures and blood cultures negative.   Recent CT scan abdomen pelvis and renal ultrasounds were normal.   No more antibiotics are indicated unless recurrent fever.  Acute kidney injury with underlying history of chronic kidney disease stage IIIa Previously known creatinine of 1.2-1.3.  Suffered ATN due to sepsis.  No evidence of uremia at this time. Urine output is adequate, more than 1100 ml last 24 hours.  Creatinine plateaued at 2.44.  May take some time to come down.   Decrease normal saline today.  Recheck renal functions tomorrow morning.  We will start patient on bicarbonate.   She will benefit with ongoing follow-up with nephrology as outpatient.    History of seizure after CVA: No  evidence of new seizure.  Adjust seizure medicine with renal functions. On Keppra 1500 mg twice daily at home-decrease dose to 750 mg twice daily On Vimpat 225 mg in a.m., 300 mg in p.m., literature suggested no adjustment of doses needed for decreased GFR.  Continue close monitoring.  Type 2 diabetes: Not on treatment at home.  Keeping on sliding scale insulin.  Sinus bradycardia: Status post pacemaker.  Stable. Follows with cardiologist.  Hypertension: Started on isosorbide and hydralazine.  Elevated but acceptable.  Anemia of chronic disease: Acute on chronic anemia.  Hemoglobin continues to drift down.  On admission underground output was 13-down to 7 today.  Patient did have episodes of vomiting with dark emesis, streaks of blood.  She does have a history of Crohn's disease.  Anemia is multifactorial.  CT scan with patulous esophagus with debris, likely esophagitis.  Started on Protonix 20 mg twice daily 9/17.  Iron, Q33 and folic acid levels are normal.  May benefit with upper GI endoscopy, gastroenterology consulted.  Benefits versus side effects discussed about blood transfusion, she will benefit with 1 unit of PRBC and that was prescribed.   Work with PT OT.  Refer to SNF.  Anticipate discharge to SNF after endoscopy and repeat levels tomorrow.  DVT prophylaxis: heparin injection 5,000 Units Start: 12/11/21 0600   Code Status: Full code Family Communication: Patient's granddaughter on the phone. Disposition Plan: Status is: Inpatient Remains inpatient appropriate because: Anemia, blood transfusions, bleeding work-up.     Consultants:  GI  Procedures:  None  Antimicrobials:  Zosyn 1 dose 9/16.   Subjective:  Patient seen and examined.  Poor historian.  As usual denies any complaints.  No overnight events.  No nausea vomiting.  No further vomiting as per patient.  Remains afebrile.  Blood pressures are adequate.  Objective: Vitals:   12/12/21 0519 12/12/21 1535  12/12/21 1938 12/13/21 0518  BP: (!) 172/62 (!) 143/65 (!) 153/59 (!) 145/59  Pulse: (!) 59 (!) 59 60 60  Resp: 16 20 18 20   Temp: 98.9 F (37.2 C) 98.1 F (36.7 C) 97.7 F (36.5 C) 98.4 F (36.9 C)  TempSrc: Oral Oral Oral Oral  SpO2: 100% 100% 100% 100%  Weight:      Height:        Intake/Output Summary (Last 24 hours) at 12/13/2021 1124 Last data filed at 12/13/2021 0618 Gross per 24 hour  Intake 2467.18 ml  Output 1200 ml  Net 1267.18 ml   Filed Weights   12/10/21 2140  Weight: 57.7 kg    Examination:  General exam: Appears calm and comfortable, poor historian.  Flat affect.  Fairly interactive on interview. Chronically sick looking and debilitated.   Respiratory system: Clear to auscultation.  No added sounds. Cardiovascular system: S1 & S2 heard, RRR.  Pacemaker left precordium.  No pedal edema. Gastrointestinal system: Abdomen is nondistended, soft and nontender. No organomegaly or masses felt. Normal bowel sounds heard. Central nervous system: Alert and oriented.  Flat affect.  Slow response.  Moves all extremities equally.   Data Reviewed: I have personally reviewed following labs and imaging studies  CBC: Recent Labs  Lab 12/11/21 0044 12/12/21 0539 12/13/21 0759  WBC 24.2* 17.3* 11.7*  NEUTROABS 20.5* 13.5* 9.0*  HGB 8.8* 8.5* 7.0*  HCT 25.5* 25.3* 21.0*  MCV 87.6 89.7 89.7  PLT 365 389 240   Basic Metabolic Panel: Recent Labs  Lab 12/11/21 0044 12/12/21 0539 12/13/21 0759  NA 136 134* 140  K 4.3 3.9 3.9  CL 112* 111 120*  CO2 17* 16* 14*  GLUCOSE 145* 166* 120*  BUN 33* 29* 23  CREATININE 2.38* 2.44* 2.44*  CALCIUM 9.2 9.0 8.2*  MG 2.0 1.9  --   PHOS 3.8 3.3  --    GFR: Estimated Creatinine Clearance: 17 mL/min (A) (by C-G formula based on SCr of 2.44 mg/dL (H)). Liver Function Tests: Recent Labs  Lab 12/11/21 0044 12/12/21 0539  AST 46* 34  ALT 34 34  ALKPHOS 74 66  BILITOT 0.7 0.4  PROT 6.5 6.3*  ALBUMIN 2.0* 2.0*   No  results for input(s): "LIPASE", "AMYLASE" in the last 168 hours. No results for input(s): "AMMONIA" in the last 168 hours. Coagulation Profile: Recent Labs  Lab 12/11/21 0044  INR 1.1   Cardiac Enzymes: No results for input(s): "CKTOTAL", "CKMB", "CKMBINDEX", "TROPONINI" in the last 168 hours. BNP (last 3 results) No results for input(s): "PROBNP" in the last 8760 hours. HbA1C: Recent Labs    12/11/21 0044  HGBA1C 7.4*   CBG: Recent Labs  Lab 12/12/21 0731 12/12/21 1140 12/12/21 1638 12/12/21 2209 12/13/21 0803  GLUCAP 158* 161* 80 143* 117*   Lipid Profile: No results for input(s): "CHOL", "HDL", "LDLCALC", "TRIG", "CHOLHDL", "LDLDIRECT" in the last 72 hours. Thyroid Function Tests: Recent Labs    12/11/21 0044  TSH 1.067   Anemia Panel: Recent Labs    12/12/21 0944  VITAMINB12 520  FOLATE 7.9  FERRITIN 658*  TIBC 149*  IRON 40   Sepsis Labs: Recent Labs  Lab 12/11/21 0044 12/12/21 0539 12/13/21 0454  PROCALCITON 1.12 0.64 0.36  Recent Results (from the past 240 hour(s))  Culture, blood (Routine X 2) w Reflex to ID Panel     Status: None (Preliminary result)   Collection Time: 12/11/21 12:44 AM   Specimen: Right Antecubital; Blood  Result Value Ref Range Status   Specimen Description   Final    RIGHT ANTECUBITAL BLOOD Performed at [facility], [address], [place], [place] [address]    Special Requests   Final    BOTTLES DRAWN AEROBIC AND ANAEROBIC Blood Culture adequate volume Performed at [facility] [address], [place], [place] [address]    Culture   Final    NO GROWTH 2 DAYS Performed at [facility] [address], [place], [place] [address]    Report Status PENDING  Incomplete  Culture, blood (Routine X 2) w Reflex to ID Panel     Status: None (Preliminary result)   Collection Time: 12/11/21 12:48 AM   Specimen: BLOOD RIGHT HAND  Result Value Ref Range Status   Specimen Description   Final     BLOOD RIGHT HAND Performed at [facility] [address], [place], [place] [address]    Special Requests   Final    BOTTLES DRAWN AEROBIC ONLY Blood Culture adequate volume Performed at [facility] [address] [place], [place] [address]    Culture   Final    NO GROWTH 2 DAYS Performed at [facility] [address], [place], [place] [address]    Report Status PENDING  Incomplete  Urine Culture     Status: None   Collection Time: 12/11/21  5:22 AM   Specimen: Urine, Clean Catch  Result Value Ref Range Status   Specimen Description   Final    URINE, CLEAN CATCH Performed at [facility], [place] [name], [place], [place] [address]    Special Requests   Final    NONE Performed at [facility], [place] [address], [place], [place] [address]    Culture   Final    NO GROWTH Performed at [facility], [place] [address] [place],  [address]    Report Status 12/12/2021 FINAL  Final         Radiology Studies: No results found.      Scheduled Meds:  sodium chloride   Intravenous Once   atorvastatin  40 mg Oral Daily   fluticasone  1 spray Each Nare Daily   heparin  5,000 Units Subcutaneous Q8H   hydrALAZINE  50 mg Oral TID   insulin aspart  0-15 Units Subcutaneous TID WC   insulin aspart  0-5 Units Subcutaneous QHS   isosorbide mononitrate  30 mg Oral q AM   lacosamide  225 mg Oral q morning   lacosamide  300 mg Oral QPM   levETIRAcetam  750 mg Oral BID   pantoprazole  20 mg Oral BID   sertraline  25 mg Oral Daily   sodium bicarbonate  650 mg Oral TID   Continuous Infusions:  sodium chloride 75 mL/hr at 12/13/21 0925     LOS: 3 days    Time spent: 35 minutes    Barb Merino, MD [facility] Pager [phone]

## 2021-12-13 NOTE — NC FL2 (Signed)
[place] MEDICAID FL2 LEVEL OF CARE SCREENING TOOL     IDENTIFICATION  Patient Name: Michelle Farrell Birthdate: Feb 07, 1941 Sex: female Admission Date (Current Location): 12/10/2021  [facility] and [place] Number:  Herbalist and Address:  [facility],  [place], [place]      Provider Number: [id number]  Attending Physician Name and Address:  Barb Merino, MD  Relative Name and Phone Number:  Melodye Ped [phone]    Current Level of Care: Hospital Recommended Level of Care: [place] Prior Approval Number:    Date Approved/Denied:   PASRR Number: [id number]  Discharge Plan: SNF    Current Diagnoses: Patient Active Problem List   Diagnosis Date Noted   Persistent fever 12/11/2021   Uncontrolled seizures ([place]) 06/19/2019   Seizure ([place]) 06/18/2019   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   Partial seizure disorder ([place]) 10/13/2014   Excessive sleepiness 10/13/2014   Seizure disorder as sequela of cerebrovascular accident ([place]) 09/14/2014   Cerebral embolism with cerebral infarction ([place]) 09/13/2014   Sinus bradycardia 09/12/2014   Dyslipidemia 09/12/2014   Convulsion ([place])    Anemia in chronic kidney disease 07/13/2014   Abnormal CT scan, stomach 07/08/2014   Duodenitis with hemorrhage 07/08/2014   Gastritis and gastroduodenitis 07/08/2014   Esophagitis determined by endoscopy 07/08/2014   Hypokalemia 07/04/2014   Anemia 07/04/2014   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   Sepsis secondary to UTI ([place])    Acute encephalopathy 06/19/2014   Tobacco abuse 06/19/2014   Crohn's disease ([place]) 06/12/2014   AKI (acute kidney injury) ([place]) 05/16/2014   Pelvic mass 05/16/2014   Hypertension 88/89/1694   Diastolic CHF ([place]) 50/38/8828   Back pain, chronic 05/16/2014   OBSTRUCTIVE SLEEP APNEA 11/14/2008   Diabetes mellitus ([place]) 06/28/2007   NEUROPATHY 06/28/2007   ESOPHAGEAL STRICTURE  06/28/2007   HIATAL HERNIA 06/28/2007   POLYARTHRITIS 06/28/2007   PUD, HX OF 06/28/2007    Orientation RESPIRATION BLADDER Height & Weight     Self, Place  Normal Incontinent, External catheter Weight: 127 lb 3.3 oz (57.7 kg) Height:  5' 6"  (167.6 cm)  BEHAVIORAL SYMPTOMS/MOOD NEUROLOGICAL BOWEL NUTRITION STATUS      Incontinent Diet (Regular)  AMBULATORY STATUS COMMUNICATION OF NEEDS Skin   Extensive Assist Verbally Normal                       Personal Care Assistance Level of Assistance  Bathing, Dressing, Feeding Bathing Assistance: Maximum assistance Feeding assistance: Limited assistance Dressing Assistance: Maximum assistance     Functional Limitations Info  Sight, Hearing, Speech Sight Info: Adequate Hearing Info: Adequate Speech Info: Adequate    SPECIAL CARE FACTORS FREQUENCY  PT (By licensed PT), OT (By licensed OT)     PT Frequency: 5x/wk OT Frequency: 5x/wk            Contractures Contractures Info: Not present    Additional Factors Info  Code Status, Allergies, Psychotropic Code Status Info: FULL Allergies Info: Tape, Tramadol, Latex Psychotropic Info: See MAR         Current Medications (12/13/2021):  This is the current hospital active medication list Current Facility-Administered Medications  Medication Dose Route Frequency Provider Last Rate Last Admin   0.9 %  sodium chloride infusion (Manually program via Guardrails IV Fluids)   Intravenous Once Barb Merino, MD       0.9 %  sodium chloride infusion   Intravenous Continuous Barb Merino, MD 75 mL/hr at  12/13/21 0925 Rate Change at 12/13/21 0925   acetaminophen (TYLENOL) tablet 650 mg  650 mg Oral Q6H PRN Etta Quill, DO   650 mg at 12/12/21 3491   Or   acetaminophen (TYLENOL) suppository 650 mg  650 mg Rectal Q6H PRN Etta Quill, DO       atorvastatin (LIPITOR) tablet 40 mg  40 mg Oral Daily Jennette Kettle M, DO   40 mg at 12/12/21 2108   fluticasone (FLONASE) 50  MCG/ACT nasal spray 1 spray  1 spray Each Nare Daily Barb Merino, MD   1 spray at 12/13/21 0936   heparin injection 5,000 Units  5,000 Units Subcutaneous Q8H Jennette Kettle M, DO   5,000 Units at 12/13/21 0518   hydrALAZINE (APRESOLINE) tablet 50 mg  50 mg Oral TID Barb Merino, MD   50 mg at 12/12/21 2109   insulin aspart (novoLOG) injection 0-15 Units  0-15 Units Subcutaneous TID WC Etta Quill, DO   3 Units at 12/12/21 1219   insulin aspart (novoLOG) injection 0-5 Units  0-5 Units Subcutaneous QHS Alcario Drought, Jared M, DO       isosorbide mononitrate (IMDUR) 24 hr tablet 30 mg  30 mg Oral q AM Barb Merino, MD   30 mg at 12/12/21 0914   lacosamide (VIMPAT) tablet 225 mg  225 mg Oral q morning Barb Merino, MD   225 mg at 12/13/21 7915   lacosamide (VIMPAT) tablet 300 mg  300 mg Oral QPM Barb Merino, MD   300 mg at 12/12/21 1721   levETIRAcetam (KEPPRA) tablet 750 mg  750 mg Oral BID Barb Merino, MD   750 mg at 12/13/21 0937   ondansetron (ZOFRAN-ODT) disintegrating tablet 4 mg  4 mg Oral Q6H PRN Hollace Hayward K, NP   4 mg at 12/11/21 2221   Or   ondansetron (ZOFRAN) injection 4 mg  4 mg Intravenous Q6H PRN Hollace Hayward K, NP   4 mg at 12/12/21 0501   pantoprazole (PROTONIX) EC tablet 20 mg  20 mg Oral BID Barb Merino, MD   20 mg at 12/12/21 2108   sertraline (ZOLOFT) tablet 25 mg  25 mg Oral Daily Barb Merino, MD   25 mg at 12/12/21 0569   sodium bicarbonate tablet 650 mg  650 mg Oral TID Barb Merino, MD       Facility-Administered Medications Ordered in Other Encounters  Medication Dose Route Frequency Provider Last Rate Last Admin   sodium chloride 0.9 % injection 10-40 mL  10-40 mL Intracatheter PRN Ladene Artist, MD   10 mL at 07/09/14 0501     Discharge Medications: Please see discharge summary for a list of discharge medications.  Relevant Imaging Results:  Relevant Lab Results:   Additional Information SS#: [id number]  Vassie Moselle,  LCSW

## 2021-12-13 NOTE — H&P (View-Only) (Signed)
Consultation Note   Referring Provider: [facility] PCP: Doretha Sou, MD Primary Gastroenterologist: Previously Delfin Edis, MD Reason for consultation: hematemesis , anemia Hospital Day: 4  Assessment / Plan   # 81 yo female transferred to [facility] for further care after prolonged hospitalization at [facility] for for confusion, sepsis, E.Coli bacteremia / E.coli UTI, AKI on CKD  # Vomiting and one episode of hematemesis ( on Plavix) a few days ago. Rule out esophagitis, Cameron's lesions, PUD.  Spoke with Granddaughter about an EGD. We discussed the risk / benefits and she would like to proceed. Will plan for EGD to be done tomorrow.  Continue PO BID PPI  # [place] anemia. Baseline hgb 13.8 . Over the course of several days it has declined into 8 range. Today it is 7.0 but ? Spruious result in absence for bleeding. >>Anemia possibly multifactorial  ( renal disease, IVF and GI bleed).  Repeat CBC later today  # Remote history of Crohn's disease. Details not available. No flares in > 10 years off treatment. Last colonoscopy ( outside facility) was in 2015.. TI intubated and 10 cm examined. Mucosa appeared normal but prep was poor. Several small polyps were removed and surveillance biopsies done. Path not found.  May need outpatient colonoscopy at some point.   # History of CVA. On plavix. I am unable to tell from [facility] records when the last dose was given. She hasn't had it since arriving to [facility]  9/16.   See PMH for additional medical problems   HPI   Michelle Farrell is a 81 y.o. female with a past medical history significant for  Crohn's disease seizure disorder, DM, CKD stage 2, CVA on plavix, pacemaker, HTN, HLD, TAH, cholecystectomy. See PMH for any additional medical problems.  Ms Eutsler was hospitalized at [facility] several days ago with AKI , confusion, and sepsis from UTI / E. Coli  bacteremia. WBC were very elevated, improved with antibiotics but she has persistent fevers. . A few days ago she developed vomiting. CT AP without contrast >> fluid filled esophagus, moderated sized hiatal hernia, , small bilateral pleural effusion. No acute abdominal findings.  Family requested transfer to [facility] for further care.    History mainly comes from chart and patient's granddaughter [name].  Patient with confusion and cannot remember details about [facility] admission. Patient used to live with her Granddaughter but a few weeks ago moved to a nursing facility.    Granddaughter tells me patient has had intermittent nausea prior to this hospitalization. A couple of days ago she had a an episode of hematemesis (dark red blood). She has had a decline in hgb to 7.0, down from baseline of ~ 13.8.  Per Granddaughter she has had some darker than normal stools at home but this isn't entirely uncommon for her. No Crohn's flare in > 10 years ( off treatment).    Previous GI Evaluation    April 2016 EGD for N/V and hematemesis - LA Class A esophagitis. Mild erosive gastritis in the gastric body. Mild nonerosive duodenitis. A 5 cm hiatal hernia  Recent Labs and Imaging No results found.  Labs:  Recent Labs    12/11/21 0044 12/12/21 0539 12/13/21 0759  WBC 24.2* 17.3* 11.7*  HGB 8.8* 8.5* 7.0*  HCT 25.5* 25.3* 21.0*  PLT 365 389 358   Recent Labs    12/11/21 0044 12/12/21 0539 12/13/21 0759  NA 136 134* 140  K 4.3 3.9 3.9  CL 112* 111 120*  CO2 17* 16* 14*  GLUCOSE 145* 166* 120*  BUN 33* 29* 23  CREATININE 2.38* 2.44* 2.44*  CALCIUM 9.2 9.0 8.2*   Recent Labs    12/12/21 0539  PROT 6.3*  ALBUMIN 2.0*  AST 34  ALT 34  ALKPHOS 66  BILITOT 0.4   No results for input(s): "HEPBSAG", "HCVAB", "HEPAIGM", "HEPBIGM" in the last 72 hours. Recent Labs    12/11/21 0044  LABPROT 14.1  INR 1.1    Past Medical History:  Diagnosis Date   AKI (acute kidney injury)  ([place])    Arthritis    Back pain, chronic    CHF (congestive heart failure) (HCC)    CKD (chronic kidney disease)    COPD (chronic obstructive pulmonary disease) ([place])    Crohn disease ([place])    Diabetes mellitus without complication ([place])    Esophageal stricture    s/p dilation 2003, 2008   GERD (gastroesophageal reflux disease)    Hip pain, chronic    Hypertension    PUD (peptic ulcer disease)    Seizures (HCC)    Sleep apnea    Stroke [facility])     Past Surgical History:  Procedure Laterality Date   ABDOMINAL HYSTERECTOMY     BLADDER SURGERY     CHOLECYSTECTOMY     ESOPHAGOGASTRODUODENOSCOPY N/A 07/08/2014   Procedure: ESOPHAGOGASTRODUODENOSCOPY (EGD);  Surgeon: Ladene Artist, MD;  Location: [facility] ENDOSCOPY;  Service: Endoscopy;  Laterality: N/A;   SKIN CANCER EXCISION     TONSILLECTOMY      Family History  Problem Relation Age of Onset   Ovarian cancer Maternal Grandmother    Stroke Mother    Stroke Brother     Prior to Admission medications   Medication Sig Start Date End Date Taking? Authorizing Provider  acetaminophen (TYLENOL) 325 MG tablet Take 325 mg by mouth every 6 (six) hours as needed for mild pain or headache.   Yes [provider]  atorvastatin (LIPITOR) 40 MG tablet Take 40 mg by mouth at bedtime.  08/16/16  Yes [provider]  diclofenac Sodium (VOLTAREN) 1 % GEL Apply 2 g topically in the morning and at bedtime.   Yes [provider]  fluticasone (FLONASE) 50 MCG/ACT nasal spray Place 1 spray into both nostrils daily.   Yes [provider]  hydrALAZINE (APRESOLINE) 25 MG tablet Take 50 mg by mouth 3 (three) times daily. 03/16/15  Yes [provider]  isosorbide mononitrate (IMDUR) 30 MG 24 hr tablet Take 30 mg by mouth in the morning.  04/14/15  Yes [provider]  lacosamide (VIMPAT) 50 MG TABS tablet Take 4.5 tablets (225 mg total) by mouth in the morning. 06/22/19  Yes Hosie Poisson, MD  lacosamide 150 MG  TABS Take 2 tablets (300 mg total) by mouth at bedtime. 06/21/19  Yes Hosie Poisson, MD  levETIRAcetam (KEPPRA) 750 MG tablet Take 2 tablets (1,500 mg total) by mouth in the morning. Patient taking differently: Take 1,500 mg by mouth 2 (two) times daily. 06/22/19  Yes Hosie Poisson, MD  ZOLOFT 25 MG tablet Take 25 mg by mouth daily. 09/07/21  Yes [provider]    Current Facility-Administered Medications  Medication Dose Route Frequency Provider  Last Rate Last Admin   0.9 %  sodium chloride infusion (Manually program via Guardrails IV Fluids)   Intravenous Once Barb Merino, MD       0.9 %  sodium chloride infusion   Intravenous Continuous Barb Merino, MD 75 mL/hr at 12/13/21 0925 Rate Change at 12/13/21 0925   acetaminophen (TYLENOL) tablet 650 mg  650 mg Oral Q6H PRN Etta Quill, DO   650 mg at 12/12/21 0915   Or   acetaminophen (TYLENOL) suppository 650 mg  650 mg Rectal Q6H PRN Etta Quill, DO       atorvastatin (LIPITOR) tablet 40 mg  40 mg Oral Daily Jennette Kettle M, DO   40 mg at 12/12/21 2108   fluticasone (FLONASE) 50 MCG/ACT nasal spray 1 spray  1 spray Each Nare Daily Barb Merino, MD   1 spray at 12/13/21 0936   heparin injection 5,000 Units  5,000 Units Subcutaneous Q8H Jennette Kettle M, DO   5,000 Units at 12/13/21 0518   hydrALAZINE (APRESOLINE) tablet 50 mg  50 mg Oral TID Barb Merino, MD   50 mg at 12/12/21 2109   insulin aspart (novoLOG) injection 0-15 Units  0-15 Units Subcutaneous TID WC Etta Quill, DO   3 Units at 12/12/21 1219   insulin aspart (novoLOG) injection 0-5 Units  0-5 Units Subcutaneous QHS Alcario Drought, Jared M, DO       isosorbide mononitrate (IMDUR) 24 hr tablet 30 mg  30 mg Oral q AM Barb Merino, MD   30 mg at 12/12/21 0914   lacosamide (VIMPAT) tablet 225 mg  225 mg Oral q morning Barb Merino, MD   225 mg at 12/13/21 3546   lacosamide (VIMPAT) tablet 300 mg  300 mg Oral QPM Barb Merino, MD   300 mg at 12/12/21 1721    levETIRAcetam (KEPPRA) tablet 750 mg  750 mg Oral BID Barb Merino, MD   750 mg at 12/13/21 0937   ondansetron (ZOFRAN-ODT) disintegrating tablet 4 mg  4 mg Oral Q6H PRN Hollace Hayward K, NP   4 mg at 12/11/21 2221   Or   ondansetron (ZOFRAN) injection 4 mg  4 mg Intravenous Q6H PRN Hollace Hayward K, NP   4 mg at 12/12/21 0501   pantoprazole (PROTONIX) EC tablet 20 mg  20 mg Oral BID Barb Merino, MD   20 mg at 12/12/21 2108   sertraline (ZOLOFT) tablet 25 mg  25 mg Oral Daily Barb Merino, MD   25 mg at 12/12/21 5681   sodium bicarbonate tablet 650 mg  650 mg Oral TID Barb Merino, MD       Facility-Administered Medications Ordered in Other Encounters  Medication Dose Route Frequency Provider Last Rate Last Admin   sodium chloride 0.9 % injection 10-40 mL  10-40 mL Intracatheter PRN Ladene Artist, MD   10 mL at 07/09/14 0501    Allergies as of 12/10/2021 - Review Complete 11/21/2020  Allergen Reaction Noted   Tape Other (See Comments) 06/18/2019   Tramadol Other (See Comments) 10/13/2014    Social History   Socioeconomic History   Marital status: Widowed    Spouse name: Not on file   Number of children: Not on file   Years of education: Not on file   Highest education level: Not on file  Occupational History   Not on file  Tobacco Use   Smoking status: Former    Packs/day: 1.00    Years: 30.00    Total pack  years: 30.00    Types: Cigarettes    Quit date: 05/27/2014    Years since quitting: 7.5   Smokeless tobacco: Never  Vaping Use   Vaping Use: Never used  Substance and Sexual Activity   Alcohol use: No   Drug use: No   Sexual activity: Never  Other Topics Concern   Not on file  Social History Narrative   24 oz of caffeine a day    Lives alone at home occasionally with granddaughter     Widowed and disabled    Social Determinants of Health   Financial Resource Strain: Not on file  Food Insecurity: Not on file  Transportation Needs: Not on file   Physical Activity: Not on file  Stress: Not on file  Social Connections: Not on file  Intimate Partner Violence: Not on file    Review of Systems: All systems reviewed and negative except where noted in HPI.  Physical Exam: Vital signs in last 24 hours: Temp:  [97.7 F (36.5 C)-98.4 F (36.9 C)] 98.4 F (36.9 C) (09/18 0518) Pulse Rate:  [59-60] 60 (09/18 0518) Resp:  [18-20] 20 (09/18 0518) BP: (143-153)/(59-65) 145/59 (09/18 0518) SpO2:  [100 %] 100 % (09/18 0518) Last BM Date : 12/12/21  General:  Alert female in NAD Psych:  Pleasant, cooperative. Normal mood and affect Eyes: Pupils equal, no icterus. Conjunctive pink Ears:  Normal auditory acuity Nose: No deformity, discharge or lesions Neck:  Supple, no masses felt Lungs:  Clear to auscultation.  Heart:  Regular rate, regular rhythm. Murmur present. No lower extremity edema Abdomen:  Soft, nondistended, nontender, active bowel sounds, no masses felt Rectal :  Deferred Msk: Symmetrical without gross deformities.  Neurologic:  Alert, some confusion / memory issues Skin:  Intact without significant lesions.    Intake/Output from previous day: 09/17 0701 - 09/18 0700 In: 3131.3 [P.O.:460; I.V.:2671.3] Out: 1200 [Urine:1200] Intake/Output this shift:  No intake/output data recorded.    Principal Problem:   Persistent fever Active Problems:   Diabetes mellitus ([place])   AKI (acute kidney injury) ([place])   Seizure disorder as sequela of cerebrovascular accident [facility])    Tye Savoy, NP-C @  12/13/2021, 10:24 AM

## 2021-12-13 NOTE — TOC Initial Note (Addendum)
Transition of Care [facility]) - Initial/Assessment Note    Patient Details  Name: Michelle Farrell MRN: [id number] Date of Birth: 1940-12-16  Transition of Care [facility]) CM/SW Contact:    Vassie Moselle, LCSW Phone Number: 12/13/2021, 11:14 AM  Clinical Narrative:                 Spoke with pt's granddaughter and confirmed plan for SNF placement. Pt's granddaughter shares that this pt had been referred and accepted by [facility] for SNF placement while pt was at [facility]. Pt has been worked up for SNF placement and referred to [facility]. CSW has reached out to admissions at [facility] to confirm acceptance and currently awaiting response.   Update 1236: Pt has been accepted to [facility]. Pt is expected to discharge tomorrow pending endoscopy and repeat levels. Insurance authorization has been requested with admit date of 9/19. Authorization currently pending.   Update 1250: Pt's insurance authorization has been approved from 9/19 to 9/21. Navi ID: [id number]  Expected Discharge Plan: Skilled Nursing Facility Barriers to Discharge: Continued Medical Work up   Patient Goals and CMS Choice Patient states their goals for this hospitalization and ongoing recovery are:: To go home   Choice offered to / list presented to : Patient (Granddaughter)  Expected Discharge Plan and Services Expected Discharge Plan: [place] In-house Referral: NA Discharge Planning Services: CM Consult Post Acute Care Choice: [place] Living arrangements for the past 2 months: [place]                 DME Arranged: N/A DME Agency: NA                  Prior Living Arrangements/Services Living arrangements for the past 2 months: [place] Lives with:: Facility Resident Patient language and need for interpreter reviewed:: Yes Do you feel safe going back to the place where you live?: Yes      Need for Family Participation in Patient Care: Yes  (Comment) Care giver support system in place?: Yes (comment)   Criminal Activity/Legal Involvement Pertinent to Current Situation/Hospitalization: No - Comment as needed  Activities of Daily Living Home Assistive Devices/Equipment: Wheelchair ADL Screening (condition at time of admission) Patient's cognitive ability adequate to safely complete daily activities?: Yes Is the patient deaf or have difficulty hearing?: No Does the patient have difficulty seeing, even when wearing glasses/contacts?: No Does the patient have difficulty concentrating, remembering, or making decisions?: No Patient able to express need for assistance with ADLs?: Yes Does the patient have difficulty dressing or bathing?: Yes Independently performs ADLs?: No Communication: Independent Dressing (OT): Needs assistance Is this a change from baseline?: Pre-admission baseline Grooming: Needs assistance Is this a change from baseline?: Pre-admission baseline Feeding: Needs assistance Is this a change from baseline?: Pre-admission baseline Bathing: Needs assistance Is this a change from baseline?: Pre-admission baseline Toileting: Needs assistance Is this a change from baseline?: Pre-admission baseline In/Out Bed: Needs assistance Is this a change from baseline?: Pre-admission baseline Walks in Home: Needs assistance Is this a change from baseline?: Pre-admission baseline Does the patient have difficulty walking or climbing stairs?: Yes Weakness of Legs: Both Weakness of Arms/Hands: Both  Permission Sought/Granted Permission sought to share information with : Family Supports, Chartered certified accountant granted to share information with : Yes, Verbal Permission Granted  Share Information with NAME: Andrey Spearman     Permission granted to share info w Relationship: Granddaughter  Permission granted to share  info w Contact Information: [phone]  Emotional Assessment Appearance:: Appears  stated age Attitude/Demeanor/Rapport: Unable to Assess Affect (typically observed): Unable to Assess Orientation: : Oriented to Self, Oriented to Place Alcohol / Substance Use: Not Applicable Psych Involvement: No (comment)  Admission diagnosis:  Persistent fever [R50.9] Patient Active Problem List   Diagnosis Date Noted   Persistent fever 12/11/2021   Uncontrolled seizures ([place]) 06/19/2019   Seizure ([place]) 06/18/2019   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   Partial seizure disorder ([place]) 10/13/2014   Excessive sleepiness 10/13/2014   Seizure disorder as sequela of cerebrovascular accident ([place]) 09/14/2014   Cerebral embolism with cerebral infarction ([place]) 09/13/2014   Sinus bradycardia 09/12/2014   Dyslipidemia 09/12/2014   Convulsion ([place])    Anemia in chronic kidney disease 07/13/2014   Abnormal CT scan, stomach 07/08/2014   Duodenitis with hemorrhage 07/08/2014   Gastritis and gastroduodenitis 07/08/2014   Esophagitis determined by endoscopy 07/08/2014   Hypokalemia 07/04/2014   Anemia 07/04/2014   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   Sepsis secondary to UTI [facility])    Acute encephalopathy 06/19/2014   Tobacco abuse 06/19/2014   Crohn's disease ([place]) 06/12/2014   AKI (acute kidney injury) ([place]) 05/16/2014   Pelvic mass 05/16/2014   Hypertension 15/40/0867   Diastolic CHF ([place]) 61/95/0932   Back pain, chronic 05/16/2014   OBSTRUCTIVE SLEEP APNEA 11/14/2008   Diabetes mellitus ([place]) 06/28/2007   NEUROPATHY 06/28/2007   ESOPHAGEAL STRICTURE 06/28/2007   HIATAL HERNIA 06/28/2007   POLYARTHRITIS 06/28/2007   PUD, HX OF 06/28/2007   PCP:  Doretha Sou, MD Pharmacy:   [place], [place] [address] Phone: [phone] Fax: [id number] - [place], [address] [place]  [place] [address] Phone: [phone] Fax: [phone]     Social Determinants of Health (SDOH) Interventions    Readmission Risk Interventions    12/13/2021   11:08 AM  Readmission Risk Prevention Plan  Post Dischage Appt Complete  Medication Screening Complete  Transportation Screening Complete

## 2021-12-13 NOTE — Consult Note (Addendum)
Consultation Note   Referring Provider: [facility] PCP: Michelle Sou, MD Primary Gastroenterologist: Previously Delfin Edis, MD Reason for consultation: hematemesis , anemia Hospital Day: 4  Assessment / Plan   # 81 yo female transferred to [facility] for further care after prolonged hospitalization at [facility] for for confusion, sepsis, E.Coli bacteremia / E.coli UTI, AKI on CKD  # Vomiting and one episode of hematemesis ( on Plavix) a few days ago. Rule out esophagitis, Cameron's lesions, PUD.  Spoke with Granddaughter about an EGD. We discussed the risk / benefits and she would like to proceed. Will plan for EGD to be done tomorrow.  Continue PO BID PPI  # [place] anemia. Baseline hgb 13.8 . Over the course of several days it has declined into 8 range. Today it is 7.0 but ? Spruious result in absence for bleeding. >>Anemia possibly multifactorial  ( renal disease, IVF and GI bleed).  Repeat CBC later today  # Remote history of Crohn's disease. Details not available. No flares in > 10 years off treatment. Last colonoscopy ( outside facility) was in 2015.. TI intubated and 10 cm examined. Mucosa appeared normal but prep was poor. Several small polyps were removed and surveillance biopsies done. Path not found.  May need outpatient colonoscopy at some point.   # History of CVA. On plavix. I am unable to tell from [facility] records when the last dose was given. She hasn't had it since arriving to [facility]  9/16.   See PMH for additional medical problems   HPI   Michelle Farrell is a 81 y.o. female with a past medical history significant for  Crohn's disease seizure disorder, DM, CKD stage 2, CVA on plavix, pacemaker, HTN, HLD, TAH, cholecystectomy. See PMH for any additional medical problems.  [name] was hospitalized at [facility] several days ago with AKI , confusion, and sepsis from UTI / E. Coli  bacteremia. WBC were very elevated, improved with antibiotics but she has persistent fevers. . A few days ago she developed vomiting. CT AP without contrast >> fluid filled esophagus, moderated sized hiatal hernia, , small bilateral pleural effusion. No acute abdominal findings.  Family requested transfer to [facility] for further care.    History mainly comes from chart and patient's granddaughter Michelle Farrell.  Patient with confusion and cannot remember details about [facility] admission. Patient used to live with her Granddaughter but a few weeks ago moved to a nursing facility.    Granddaughter tells me patient has had intermittent nausea prior to this hospitalization. A couple of days ago she had a an episode of hematemesis (dark red blood). She has had a decline in hgb to 7.0, down from baseline of ~ 13.8.  Per Granddaughter she has had some darker than normal stools at home but this isn't entirely uncommon for her. No Crohn's flare in > 10 years ( off treatment).    Previous GI Evaluation    April 2016 EGD for N/V and hematemesis - LA Class A esophagitis. Mild erosive gastritis in the gastric body. Mild nonerosive duodenitis. A 5 cm hiatal hernia  Recent Labs and Imaging No results found.  Labs:  Recent Labs    12/11/21 0044 12/12/21 0539 12/13/21 0759  WBC 24.2* 17.3* 11.7*  HGB 8.8* 8.5* 7.0*  HCT 25.5* 25.3* 21.0*  PLT 365 389 358   Recent Labs    12/11/21 0044 12/12/21 0539 12/13/21 0759  NA 136 134* 140  K 4.3 3.9 3.9  CL 112* 111 120*  CO2 17* 16* 14*  GLUCOSE 145* 166* 120*  BUN 33* 29* 23  CREATININE 2.38* 2.44* 2.44*  CALCIUM 9.2 9.0 8.2*   Recent Labs    12/12/21 0539  PROT 6.3*  ALBUMIN 2.0*  AST 34  ALT 34  ALKPHOS 66  BILITOT 0.4   No results for input(s): "HEPBSAG", "HCVAB", "HEPAIGM", "HEPBIGM" in the last 72 hours. Recent Labs    12/11/21 0044  LABPROT 14.1  INR 1.1    Past Medical History:  Diagnosis Date   AKI (acute kidney injury)  ([place])    Arthritis    Back pain, chronic    CHF (congestive heart failure) (HCC)    CKD (chronic kidney disease)    COPD (chronic obstructive pulmonary disease) ([place])    Crohn disease ([place])    Diabetes mellitus without complication ([place])    Esophageal stricture    s/p dilation 2003, 2008   GERD (gastroesophageal reflux disease)    Hip pain, chronic    Hypertension    PUD (peptic ulcer disease)    Seizures (HCC)    Sleep apnea    Stroke [facility])     Past Surgical History:  Procedure Laterality Date   ABDOMINAL HYSTERECTOMY     BLADDER SURGERY     CHOLECYSTECTOMY     ESOPHAGOGASTRODUODENOSCOPY N/A 07/08/2014   Procedure: ESOPHAGOGASTRODUODENOSCOPY (EGD);  Surgeon: Ladene Artist, MD;  Location: [facility] ENDOSCOPY;  Service: Endoscopy;  Laterality: N/A;   SKIN CANCER EXCISION     TONSILLECTOMY      Family History  Problem Relation Age of Onset   Ovarian cancer Maternal Grandmother    Stroke Mother    Stroke Brother     Prior to Admission medications   Medication Sig Start Date End Date Taking? Authorizing Provider  acetaminophen (TYLENOL) 325 MG tablet Take 325 mg by mouth every 6 (six) hours as needed for mild pain or headache.   Yes [provider]  atorvastatin (LIPITOR) 40 MG tablet Take 40 mg by mouth at bedtime.  08/16/16  Yes [provider]  diclofenac Sodium (VOLTAREN) 1 % GEL Apply 2 g topically in the morning and at bedtime.   Yes [provider]  fluticasone (FLONASE) 50 MCG/ACT nasal spray Place 1 spray into both nostrils daily.   Yes [provider]  hydrALAZINE (APRESOLINE) 25 MG tablet Take 50 mg by mouth 3 (three) times daily. 03/16/15  Yes [provider]  isosorbide mononitrate (IMDUR) 30 MG 24 hr tablet Take 30 mg by mouth in the morning.  04/14/15  Yes [provider]  lacosamide (VIMPAT) 50 MG TABS tablet Take 4.5 tablets (225 mg total) by mouth in the morning. 06/22/19  Yes Hosie Poisson, MD  lacosamide 150 MG  TABS Take 2 tablets (300 mg total) by mouth at bedtime. 06/21/19  Yes Hosie Poisson, MD  levETIRAcetam (KEPPRA) 750 MG tablet Take 2 tablets (1,500 mg total) by mouth in the morning. Patient taking differently: Take 1,500 mg by mouth 2 (two) times daily. 06/22/19  Yes Hosie Poisson, MD  ZOLOFT 25 MG tablet Take 25 mg by mouth daily. 09/07/21  Yes [provider]    Current Facility-Administered Medications  Medication Dose Route Frequency Provider  Last Rate Last Admin   0.9 %  sodium chloride infusion (Manually program via Guardrails IV Fluids)   Intravenous Once Barb Merino, MD       0.9 %  sodium chloride infusion   Intravenous Continuous Barb Merino, MD 75 mL/hr at 12/13/21 0925 Rate Change at 12/13/21 0925   acetaminophen (TYLENOL) tablet 650 mg  650 mg Oral Q6H PRN Etta Quill, DO   650 mg at 12/12/21 0915   Or   acetaminophen (TYLENOL) suppository 650 mg  650 mg Rectal Q6H PRN Etta Quill, DO       atorvastatin (LIPITOR) tablet 40 mg  40 mg Oral Daily Jennette Kettle M, DO   40 mg at 12/12/21 2108   fluticasone (FLONASE) 50 MCG/ACT nasal spray 1 spray  1 spray Each Nare Daily Barb Merino, MD   1 spray at 12/13/21 0936   heparin injection 5,000 Units  5,000 Units Subcutaneous Q8H Jennette Kettle M, DO   5,000 Units at 12/13/21 0518   hydrALAZINE (APRESOLINE) tablet 50 mg  50 mg Oral TID Barb Merino, MD   50 mg at 12/12/21 2109   insulin aspart (novoLOG) injection 0-15 Units  0-15 Units Subcutaneous TID WC Etta Quill, DO   3 Units at 12/12/21 1219   insulin aspart (novoLOG) injection 0-5 Units  0-5 Units Subcutaneous QHS Alcario Drought, Jared M, DO       isosorbide mononitrate (IMDUR) 24 hr tablet 30 mg  30 mg Oral q AM Barb Merino, MD   30 mg at 12/12/21 0914   lacosamide (VIMPAT) tablet 225 mg  225 mg Oral q morning Barb Merino, MD   225 mg at 12/13/21 7001   lacosamide (VIMPAT) tablet 300 mg  300 mg Oral QPM Barb Merino, MD   300 mg at 12/12/21 1721    levETIRAcetam (KEPPRA) tablet 750 mg  750 mg Oral BID Barb Merino, MD   750 mg at 12/13/21 0937   ondansetron (ZOFRAN-ODT) disintegrating tablet 4 mg  4 mg Oral Q6H PRN Hollace Hayward K, NP   4 mg at 12/11/21 2221   Or   ondansetron (ZOFRAN) injection 4 mg  4 mg Intravenous Q6H PRN Hollace Hayward K, NP   4 mg at 12/12/21 0501   pantoprazole (PROTONIX) EC tablet 20 mg  20 mg Oral BID Barb Merino, MD   20 mg at 12/12/21 2108   sertraline (ZOLOFT) tablet 25 mg  25 mg Oral Daily Barb Merino, MD   25 mg at 12/12/21 7494   sodium bicarbonate tablet 650 mg  650 mg Oral TID Barb Merino, MD       Facility-Administered Medications Ordered in Other Encounters  Medication Dose Route Frequency Provider Last Rate Last Admin   sodium chloride 0.9 % injection 10-40 mL  10-40 mL Intracatheter PRN Ladene Artist, MD   10 mL at 07/09/14 0501    Allergies as of 12/10/2021 - Review Complete 11/21/2020  Allergen Reaction Noted   Tape Other (See Comments) 06/18/2019   Tramadol Other (See Comments) 10/13/2014    Social History   Socioeconomic History   Marital status: Widowed    Spouse name: Not on file   Number of children: Not on file   Years of education: Not on file   Highest education level: Not on file  Occupational History   Not on file  Tobacco Use   Smoking status: Former    Packs/day: 1.00    Years: 30.00    Total pack  years: 30.00    Types: Cigarettes    Quit date: 05/27/2014    Years since quitting: 7.5   Smokeless tobacco: Never  Vaping Use   Vaping Use: Never used  Substance and Sexual Activity   Alcohol use: No   Drug use: No   Sexual activity: Never  Other Topics Concern   Not on file  Social History Narrative   24 oz of caffeine a day    Lives alone at home occasionally with granddaughter     Widowed and disabled    Social Determinants of Health   Financial Resource Strain: Not on file  Food Insecurity: Not on file  Transportation Needs: Not on file   Physical Activity: Not on file  Stress: Not on file  Social Connections: Not on file  Intimate Partner Violence: Not on file    Review of Systems: All systems reviewed and negative except where noted in HPI.  Physical Exam: Vital signs in last 24 hours: Temp:  [97.7 F (36.5 C)-98.4 F (36.9 C)] 98.4 F (36.9 C) (09/18 0518) Pulse Rate:  [59-60] 60 (09/18 0518) Resp:  [18-20] 20 (09/18 0518) BP: (143-153)/(59-65) 145/59 (09/18 0518) SpO2:  [100 %] 100 % (09/18 0518) Last BM Date : 12/12/21  General:  Alert female in NAD Psych:  Pleasant, cooperative. Normal mood and affect Eyes: Pupils equal, no icterus. Conjunctive pink Ears:  Normal auditory acuity Nose: No deformity, discharge or lesions Neck:  Supple, no masses felt Lungs:  Clear to auscultation.  Heart:  Regular rate, regular rhythm. Murmur present. No lower extremity edema Abdomen:  Soft, nondistended, nontender, active bowel sounds, no masses felt Rectal :  Deferred Msk: Symmetrical without gross deformities.  Neurologic:  Alert, some confusion / memory issues Skin:  Intact without significant lesions.    Intake/Output from previous day: 09/17 0701 - 09/18 0700 In: 3131.3 [P.O.:460; I.V.:2671.3] Out: 1200 [Urine:1200] Intake/Output this shift:  No intake/output data recorded.    Principal Problem:   Persistent fever Active Problems:   Diabetes mellitus ([place])   AKI (acute kidney injury) ([place])   Seizure disorder as sequela of cerebrovascular accident [facility])    Tye Savoy, NP-C @  12/13/2021, 10:24 AM

## 2021-12-14 ENCOUNTER — Inpatient Hospital Stay (HOSPITAL_COMMUNITY): Payer: Medicare Other | Admitting: Certified Registered Nurse Anesthetist

## 2021-12-14 ENCOUNTER — Encounter (HOSPITAL_COMMUNITY): Payer: Self-pay | Admitting: Internal Medicine

## 2021-12-14 ENCOUNTER — Inpatient Hospital Stay (HOSPITAL_COMMUNITY): Payer: Medicare Other

## 2021-12-14 ENCOUNTER — Encounter (HOSPITAL_COMMUNITY)
Disposition: A | Discharge status: Skilled Nursing Facility | Payer: Self-pay | Source: Other Acute Inpatient Hospital | Attending: Internal Medicine

## 2021-12-14 ENCOUNTER — Other Ambulatory Visit: Payer: Self-pay

## 2021-12-14 DIAGNOSIS — K2289 Other specified disease of esophagus: Secondary | ICD-10-CM

## 2021-12-14 DIAGNOSIS — K449 Diaphragmatic hernia without obstruction or gangrene: Secondary | ICD-10-CM

## 2021-12-14 DIAGNOSIS — J449 Chronic obstructive pulmonary disease, unspecified: Secondary | ICD-10-CM

## 2021-12-14 DIAGNOSIS — K3189 Other diseases of stomach and duodenum: Secondary | ICD-10-CM

## 2021-12-14 DIAGNOSIS — Z87891 Personal history of nicotine dependence: Secondary | ICD-10-CM

## 2021-12-14 DIAGNOSIS — D500 Iron deficiency anemia secondary to blood loss (chronic): Secondary | ICD-10-CM

## 2021-12-14 DIAGNOSIS — K208 Other esophagitis without bleeding: Secondary | ICD-10-CM

## 2021-12-14 DIAGNOSIS — R509 Fever, unspecified: Secondary | ICD-10-CM | POA: Diagnosis not present

## 2021-12-14 HISTORY — PX: BIOPSY: SHX5522

## 2021-12-14 HISTORY — PX: ESOPHAGOGASTRODUODENOSCOPY (EGD) WITH PROPOFOL: SHX5813

## 2021-12-14 LAB — TYPE AND SCREEN
ABO/RH(D): O POS
Antibody Screen: NEGATIVE
Unit division: 0

## 2021-12-14 LAB — BASIC METABOLIC PANEL
Anion gap: 6 (ref 5–15)
BUN: 24 mg/dL — ABNORMAL HIGH (ref 8–23)
CO2: 16 mmol/L — ABNORMAL LOW (ref 22–32)
Calcium: 9.3 mg/dL (ref 8.9–10.3)
Chloride: 118 mmol/L — ABNORMAL HIGH (ref 98–111)
Creatinine, Ser: 2.59 mg/dL — ABNORMAL HIGH (ref 0.44–1.00)
GFR, Estimated: 18 mL/min — ABNORMAL LOW (ref >60)
Glucose, Bld: 126 mg/dL — ABNORMAL HIGH (ref 70–99)
Potassium: 4.1 mmol/L (ref 3.5–5.1)
Sodium: 140 mmol/L (ref 135–145)

## 2021-12-14 LAB — CBC WITH DIFFERENTIAL/PLATELET
Abs Immature Granulocytes: 0.25 10*3/uL — ABNORMAL HIGH (ref 0.00–0.07)
Basophils Absolute: 0.1 10*3/uL (ref 0.0–0.1)
Basophils Relative: 0 %
Eosinophils Absolute: 0.1 10*3/uL (ref 0.0–0.5)
Eosinophils Relative: 1 %
HCT: 28 % — ABNORMAL LOW (ref 36.0–46.0)
Hemoglobin: 9.4 g/dL — ABNORMAL LOW (ref 12.0–15.0)
Immature Granulocytes: 2 %
Lymphocytes Relative: 13 %
Lymphs Abs: 1.8 10*3/uL (ref 0.7–4.0)
MCH: 29.6 pg (ref 26.0–34.0)
MCHC: 33.6 g/dL (ref 30.0–36.0)
MCV: 88.1 fL (ref 80.0–100.0)
Monocytes Absolute: 1 10*3/uL (ref 0.1–1.0)
Monocytes Relative: 7 %
Neutro Abs: 10.4 10*3/uL — ABNORMAL HIGH (ref 1.7–7.7)
Neutrophils Relative %: 77 %
Platelets: 386 10*3/uL (ref 150–400)
RBC: 3.18 MIL/uL — ABNORMAL LOW (ref 3.87–5.11)
RDW: 16 % — ABNORMAL HIGH (ref 11.5–15.5)
WBC: 13.6 10*3/uL — ABNORMAL HIGH (ref 4.0–10.5)
nRBC: 0 % (ref 0.0–0.2)

## 2021-12-14 LAB — GLUCOSE, CAPILLARY
Glucose-Capillary: 113 mg/dL — ABNORMAL HIGH (ref 70–99)
Glucose-Capillary: 122 mg/dL — ABNORMAL HIGH (ref 70–99)
Glucose-Capillary: 152 mg/dL — ABNORMAL HIGH (ref 70–99)
Glucose-Capillary: 172 mg/dL — ABNORMAL HIGH (ref 70–99)

## 2021-12-14 LAB — BPAM RBC
Blood Product Expiration Date: 202310182359
ISSUE DATE / TIME: 202309181316
Unit Type and Rh: 5100

## 2021-12-14 LAB — CREATININE, URINE, RANDOM: Creatinine, Urine: 33 mg/dL

## 2021-12-14 LAB — SODIUM, URINE, RANDOM: Sodium, Ur: 105 mmol/L

## 2021-12-14 LAB — MAGNESIUM: Magnesium: 1.9 mg/dL (ref 1.7–2.4)

## 2021-12-14 SURGERY — ESOPHAGOGASTRODUODENOSCOPY (EGD) WITH PROPOFOL
Anesthesia: Monitor Anesthesia Care

## 2021-12-14 MED ORDER — PROPOFOL 500 MG/50ML IV EMUL
INTRAVENOUS | Status: DC | PRN
  Administered 2021-12-14: 75 ug/kg/min via INTRAVENOUS

## 2021-12-14 MED ORDER — SODIUM CHLORIDE 0.9 % IV BOLUS
2000.0000 mL | Freq: Once | INTRAVENOUS | Status: AC
  Administered 2021-12-14: 2000 mL via INTRAVENOUS

## 2021-12-14 MED ORDER — SUCRALFATE 1 GM/10ML PO SUSP
1.0000 g | Freq: Three times a day (TID) | ORAL | Status: DC
  Administered 2021-12-14 – 2021-12-16 (×9): 1 g via ORAL
  Filled 2021-12-14 (×9): qty 10

## 2021-12-14 MED ORDER — LIDOCAINE 2% (20 MG/ML) 5 ML SYRINGE
INTRAMUSCULAR | Status: DC | PRN
  Administered 2021-12-14: 50 mg via INTRAVENOUS

## 2021-12-14 MED ORDER — SODIUM CHLORIDE 0.9 % IV SOLN: INTRAVENOUS | Status: DC

## 2021-12-14 MED ORDER — PROPOFOL 10 MG/ML IV BOLUS
INTRAVENOUS | Status: DC | PRN
  Administered 2021-12-14: 20 mg via INTRAVENOUS
  Administered 2021-12-14 (×2): 25 mg via INTRAVENOUS

## 2021-12-14 MED ORDER — ONDANSETRON HCL 4 MG/2ML IJ SOLN
INTRAMUSCULAR | Status: DC | PRN
  Administered 2021-12-14: 4 mg via INTRAVENOUS

## 2021-12-14 MED ORDER — PANTOPRAZOLE SODIUM 40 MG PO TBEC
40.0000 mg | DELAYED_RELEASE_TABLET | Freq: Two times a day (BID) | ORAL | Status: DC
  Administered 2021-12-14 – 2021-12-16 (×5): 40 mg via ORAL
  Filled 2021-12-14 (×5): qty 1

## 2021-12-14 SURGICAL SUPPLY — 15 items
BLOCK BITE 60FR ADLT L/F BLUE (MISCELLANEOUS) ×2 IMPLANT
ELECT REM PT RETURN 9FT ADLT (ELECTROSURGICAL)
ELECTRODE REM PT RTRN 9FT ADLT (ELECTROSURGICAL) IMPLANT
FORCEP RJ3 GP 1.8X160 W-NEEDLE (CUTTING FORCEPS) IMPLANT
FORCEPS BIOP RAD 4 LRG CAP 4 (CUTTING FORCEPS) IMPLANT
NDL SCLEROTHERAPY 25GX240 (NEEDLE) IMPLANT
NEEDLE SCLEROTHERAPY 25GX240 (NEEDLE) IMPLANT
PROBE APC STR FIRE (PROBE) IMPLANT
PROBE INJECTION GOLD (MISCELLANEOUS)
PROBE INJECTION GOLD 7FR (MISCELLANEOUS) IMPLANT
SNARE SHORT THROW 13M SML OVAL (MISCELLANEOUS) IMPLANT
SYR 50ML LL SCALE MARK (SYRINGE) IMPLANT
TUBING ENDO SMARTCAP PENTAX (MISCELLANEOUS) ×6 IMPLANT
TUBING IRRIGATION ENDOGATOR (MISCELLANEOUS) ×3 IMPLANT
WATER STERILE IRR 1000ML POUR (IV SOLUTION) IMPLANT

## 2021-12-14 NOTE — Transfer of Care (Signed)
Immediate Anesthesia Transfer of Care Note  Patient: Michelle Farrell  Procedure(s) Performed: ESOPHAGOGASTRODUODENOSCOPY (EGD) WITH PROPOFOL BIOPSY  Patient Location: PACU and Endoscopy Unit  Anesthesia Type:MAC  Level of Consciousness: drowsy and patient cooperative  Airway & Oxygen Therapy: Patient Spontanous Breathing  Post-op Assessment: Report given to RN and Post -op Vital signs reviewed and stable  Post vital signs: Reviewed and stable  Last Vitals:  Vitals Value Taken Time  BP 152/81 12/14/21 1503  Temp 36.7 C 12/14/21 1503  Pulse 64 12/14/21 1504  Resp 24 12/14/21 1504  SpO2 97 % 12/14/21 1504  Vitals shown include unvalidated device data.  Last Pain:  Vitals:   12/14/21 1503  TempSrc: Oral  PainSc: 0-No pain         Complications: No notable events documented.

## 2021-12-14 NOTE — Anesthesia Preprocedure Evaluation (Addendum)
Anesthesia Evaluation  Patient identified by MRN, date of birth, ID band Patient awake    Reviewed: Allergy & Precautions, NPO status , Patient's Chart, lab work & pertinent test results  Airway Mallampati: II  TM Distance: >3 FB Neck ROM: Full    Dental  (+) Upper Dentures, Lower Dentures   Pulmonary sleep apnea , COPD, former smoker,    Pulmonary exam normal        Cardiovascular hypertension, Pt. on medications +CHF   Rhythm:Regular Rate:Normal     Neuro/Psych Seizures -,  CVA negative psych ROS   GI/Hepatic Neg liver ROS, PUD, GERD  ,Hematemesis    Endo/Other  diabetes, Type 2  Renal/GU CRFRenal disease  negative genitourinary   Musculoskeletal  (+) Arthritis ,   Abdominal Normal abdominal exam  (+)   Peds  Hematology  (+) Blood dyscrasia, anemia ,   Anesthesia Other Findings   Reproductive/Obstetrics                            Anesthesia Physical Anesthesia Plan  ASA: 3  Anesthesia Plan: MAC   Post-op Pain Management:    Induction: Intravenous  PONV Risk Score and Plan: 2 and Propofol infusion and Treatment may vary due to age or medical condition  Airway Management Planned: Simple Face Mask, Natural Airway and Nasal Cannula  Additional Equipment: None  Intra-op Plan:   Post-operative Plan:   Informed Consent: I have reviewed the patients History and Physical, chart, labs and discussed the procedure including the risks, benefits and alternatives for the proposed anesthesia with the patient or authorized representative who has indicated his/her understanding and acceptance.     Dental advisory given  Plan Discussed with:   Anesthesia Plan Comments: (Lab Results      Component                Value               Date                      WBC                      13.6 (H)            12/14/2021                HGB                      9.4 (L)             12/14/2021                 HCT                      28.0 (L)            12/14/2021                MCV                      88.1                12/14/2021                PLT                      386  12/14/2021           Lab Results      Component                Value               Date                      NA                       140                 12/14/2021                K                        4.1                 12/14/2021                CO2                      16 (L)              12/14/2021                GLUCOSE                  126 (H)             12/14/2021                BUN                      24 (H)              12/14/2021                CREATININE               2.59 (H)            12/14/2021                CALCIUM                  9.3                 12/14/2021                GFRNONAA                 18 (L)              12/14/2021          )        Anesthesia Quick Evaluation

## 2021-12-14 NOTE — Progress Notes (Signed)
Occupational Therapy Treatment Patient Details Name: Michelle Farrell MRN: [id number] DOB: Sep 13, 1940 Today's Date: 12/14/2021   History of present illness Patinet is a 81 year old female who was admitted to the hospital from ALF with E coli bacteremia UTI, persistent fever, and AKI. PMH: seizure disorder, ambulatory dysfunction, HTN, sinus bradycardia s/p pacemaker, hyperlipidemia, h/o CVA, DM II   OT comments  Pt required SBA for rolling bed, min assist for supine to sit, and mod assist to stand using a RW. She performed 2 stands using the RW, requiring a seated rest break between stands. She further needed mod assist for upper body dressing and max assist for toileting hygiene in standing. She took a few lateral steps towards the [facility] using a RW. She indicated having mild chest discomfort at rest, and was further noted to be with deconditioning, generalized strength deficits, decreased independence with ADLs, and impaired functional mobility. She will continue to benefit from OT services to facilitate progressive ADL performance.    Recommendations for follow up therapy are one component of a multi-disciplinary discharge planning process, led by the attending physician.  Recommendations may be updated based on patient status, additional functional criteria and insurance authorization.    Follow Up Recommendations  Skilled nursing-short term rehab (<3 hours/day)       Patient can return home with the following  A lot of help with bathing/dressing/bathroom;A lot of help with walking and/or transfers         Precautions / Restrictions Precautions Precautions: Fall Restrictions Weight Bearing Restrictions: No       Mobility Bed Mobility Overal bed mobility: Needs Assistance Bed Mobility: Supine to Sit, Sit to Supine     Supine to sit: Min assist Sit to supine: Min assist   General bed mobility comments: HOB elevated    Transfers Overall transfer level: Needs assistance Equipment  used: Rolling walker (2 wheels) Transfers: Sit to/from Stand Sit to Stand: Mod assist           General transfer comment: 2 stands performed with seated rest break between stands; cues needed for hand placement and trunk extension in standing         ADL either performed or assessed with clinical judgement   ADL   Eating/Feeding: Set up Eating/Feeding Details (indicate cue type and reason): based on clinical judgement             Upper Body Dressing : Moderate assistance Upper Body Dressing Details (indicate cue type and reason): She required assist to doff a hospital gown, then to don another clean one while in bed         Toileting- Clothing Manipulation and Hygiene: Maximal assistance;Sit to/from stand Toileting - Clothing Manipulation Details (indicate cue type and reason): Pt required increased assist for posterior peri-hygiene in standing; she was instructed to hold onto RW for added support in standing              Cognition Arousal/Alertness: Awake/alert Behavior During Therapy: [facility] for tasks assessed/performed                                        Pertinent Vitals/ Pain       Pain Assessment Pain Location: she reported having mild chest pain Pain Intervention(s): Repositioned         Frequency  Min 2X/week        Progress Toward Goals  OT  Goals(current goals can now be found in the care plan section)  Progress towards OT goals: Progressing toward goals  Acute Rehab OT Goals OT Goal Formulation: With patient Time For Goal Achievement: 12/26/21 Potential to Achieve Goals: Good  Plan Discharge plan remains appropriate       AM-PAC OT "6 Clicks" Daily Activity     Outcome Measure   Help from another person eating meals?: A Little Help from another person taking care of personal grooming?: A Little Help from another person toileting, which includes using toliet, bedpan, or urinal?: A Lot Help from another person bathing  (including washing, rinsing, drying)?: A Lot Help from another person to put on and taking off regular upper body clothing?: A Little Help from another person to put on and taking off regular lower body clothing?: A Lot 6 Click Score: 15    End of Session Equipment Utilized During Treatment: Gait belt;Rolling walker (2 wheels)  OT Visit Diagnosis: Unsteadiness on feet (R26.81);Muscle weakness (generalized) (M62.81)   Activity Tolerance Patient tolerated treatment well   Patient Left in bed;with bed alarm set;with call bell/phone within reach   Nurse Communication  (Nurse cleared pt for therapy)        Time: 3276-1470 OT Time Calculation (min): 29 min  Charges: OT Treatments $Self Care/Home Management : 8-22 mins $Therapeutic Activity: 8-22 mins    Leota Sauers, OTR/L 12/14/2021, 5:34 PM

## 2021-12-14 NOTE — Anesthesia Procedure Notes (Signed)
Procedure Name: MAC Date/Time: 12/14/2021 2:45 PM  Performed by: Renato Shin, CRNAPre-anesthesia Checklist: Patient identified, Emergency Drugs available, Suction available and Patient being monitored Patient Re-evaluated:Patient Re-evaluated prior to induction Oxygen Delivery Method: Simple face mask Preoxygenation: Pre-oxygenation with 100% oxygen Induction Type: IV induction Airway Equipment and Method: Bite block Placement Confirmation: positive ETCO2 and breath sounds checked- equal and bilateral Dental Injury: Teeth and Oropharynx as per pre-operative assessment

## 2021-12-14 NOTE — Progress Notes (Signed)
PROGRESS NOTE    Michelle Farrell  RDE:[id number] DOB: March 18, 1941 DOA: 12/10/2021 PCP: Doretha Sou, MD    Brief Narrative:  81 year old with history of seizure disorder on Keppra and Vimpat , history of stroke, ambulatory dysfunction, hypertension, sinus bradycardia status post pacemaker, hyperlipidemia admitted to [facility] since 9/5  with confusion and found to have E. coli UTI, E. coli bacteremia.  Initially treated with Zosyn then converted to Rocephin.  Cultures were pansensitive.  She continued to have intermittent fever as well not recovered fully. Patient developed acute kidney injury, baseline creatinine 1.2-1.3, presented with creatinine of 1.9 and today it is 2.3 report transferred from [place]. WBC count went up to 35 K. CT scan abdomen pelvis on 9/12, no acute findings, fluid filled esophagus. Renal ultrasound on 9/13, no obstruction Flu, RVP, COVID-negative. Family requested transfer to [facility] for infection and kidney function work-up.   Assessment & Plan:   E. coli bacteremia, E. coli UTI. Likely completely treated.   WBC count normalized.  Procalcitonin trending down.   Afebrile for 4 days off antibiotics.  Repeat urine cultures and blood cultures negative.   Recent CT scan abdomen pelvis and renal ultrasounds were normal.   No more antibiotics are indicated unless recurrent fever.  Acute kidney injury with underlying history of chronic kidney disease stage IIIa Previously known creatinine of 1.2-1.3.  Suffered ATN due to sepsis.  No evidence of uremia at this time. Urine output is adequate, more than 1100 ml last 24 hours.  Creatinine plateaued at 2.44-2.55.  May take some time to come down.   On maintenance IV fluids.  Started on bicarb.  Outpatient referral sent to nephrology. We will consult nephrology in-house to see if any modification of therapy needed.  History of seizure after CVA: No evidence of new seizure.  Adjust seizure  medicine with renal functions. On Keppra 1500 mg twice daily at home-decrease dose to 750 mg twice daily On Vimpat 225 mg in a.m., 300 mg in p.m., literature suggested no adjustment of doses needed for decreased GFR.  Continue close monitoring. Patient is not on any antiplatelet therapy.    Type 2 diabetes: Not on treatment at home.  Keeping on sliding scale insulin.  A1c 7.4.  If renal functions normalize, may benefit with metformin/Januvia.  Sinus bradycardia: Status post pacemaker.  Stable. Follows with cardiologist.  Hypertension: Started on isosorbide and hydralazine.  Elevated but acceptable.  Anemia of chronic disease: Acute on chronic anemia.  Hemoglobin drifted down which is multifactorial. 13-7 over 2 weeks-1 unit PRBC given with appropriate response. Upper GI endoscopy today. Iron, U31 and folic acid levels are normal.  Started on Protonix 20 mg twice daily.  Work with PT OT.  Refer to SNF.  Anticipate discharge to SNF tomorrow after endoscopy and stabilization of renal functions.   Need to adjust seizure medications with renal functions.  DVT prophylaxis: heparin injection 5,000 Units Start: 12/11/21 0600   Code Status: Full code Family Communication: None today. Disposition Plan: Status is: Inpatient Remains inpatient appropriate because: Anemia, blood transfusions, bleeding work-up.     Consultants:  GI Nephrology  Procedures:  EGD planned today  Antimicrobials:  None    Subjective:  Patient seen and examined.  No family at bedside today.  Patient denies any complaints.  She is aware about going to surgery today.  No nausea or vomiting.  Hemoglobin 9.4 after 1 unit.  WBC 13,000 but likely hemoconcentrated as all cell lines are elevated.  We  will recheck tomorrow.  Objective: Vitals:   12/13/21 1340 12/13/21 1555 12/13/21 1946 12/14/21 0518  BP: (!) 161/64 (!) 169/61 (!) 185/62 (!) 163/63  Pulse: 60 60 60 60  Resp: (!) 22 18 18 18   Temp: 98.5 F (36.9  C) 98.3 F (36.8 C) 98.1 F (36.7 C) 98.2 F (36.8 C)  TempSrc: Oral Oral Oral Oral  SpO2: 100% 100% 99% 100%  Weight:      Height:        Intake/Output Summary (Last 24 hours) at 12/14/2021 1041 Last data filed at 12/14/2021 0600 Gross per 24 hour  Intake 1845.75 ml  Output 800 ml  Net 1045.75 ml    Filed Weights   12/10/21 2140  Weight: 57.7 kg    Examination:  General exam: Appears calm and comfortable, Flat affect.  interactive on interview. Looks debilitated.  Lying in bed. Respiratory system: Clear to auscultation.  No added sounds. Cardiovascular system: S1 & S2 heard, RRR.  Pacemaker left precordium.  No pedal edema. Gastrointestinal system: Abdomen is nondistended, soft and nontender. No organomegaly or masses felt. Normal bowel sounds heard. Central nervous system: Alert and oriented.  Flat affect.  Slow response.  Moves all extremities equally.   Data Reviewed: I have personally reviewed following labs and imaging studies  CBC: Recent Labs  Lab 12/11/21 0044 12/12/21 0539 12/13/21 0759 12/14/21 0532  WBC 24.2* 17.3* 11.7* 13.6*  NEUTROABS 20.5* 13.5* 9.0* 10.4*  HGB 8.8* 8.5* 7.0* 9.4*  HCT 25.5* 25.3* 21.0* 28.0*  MCV 87.6 89.7 89.7 88.1  PLT 365 389 358 539    Basic Metabolic Panel: Recent Labs  Lab 12/11/21 0044 12/12/21 0539 12/13/21 0759 12/14/21 0532  NA 136 134* 140 140  K 4.3 3.9 3.9 4.1  CL 112* 111 120* 118*  CO2 17* 16* 14* 16*  GLUCOSE 145* 166* 120* 126*  BUN 33* 29* 23 24*  CREATININE 2.38* 2.44* 2.44* 2.59*  CALCIUM 9.2 9.0 8.2* 9.3  MG 2.0 1.9  --  1.9  PHOS 3.8 3.3  --   --     GFR: Estimated Creatinine Clearance: 16 mL/min (A) (by C-G formula based on SCr of 2.59 mg/dL (H)). Liver Function Tests: Recent Labs  Lab 12/11/21 0044 12/12/21 0539  AST 46* 34  ALT 34 34  ALKPHOS 74 66  BILITOT 0.7 0.4  PROT 6.5 6.3*  ALBUMIN 2.0* 2.0*    No results for input(s): "LIPASE", "AMYLASE" in the last 168 hours. No  results for input(s): "AMMONIA" in the last 168 hours. Coagulation Profile: Recent Labs  Lab 12/11/21 0044  INR 1.1    Cardiac Enzymes: No results for input(s): "CKTOTAL", "CKMB", "CKMBINDEX", "TROPONINI" in the last 168 hours. BNP (last 3 results) No results for input(s): "PROBNP" in the last 8760 hours. HbA1C: No results for input(s): "HGBA1C" in the last 72 hours.  CBG: Recent Labs  Lab 12/13/21 1146 12/13/21 1215 12/13/21 1627 12/13/21 2033 12/14/21 0751  GLUCAP 117* 125* 161* 122* 113*    Lipid Profile: No results for input(s): "CHOL", "HDL", "LDLCALC", "TRIG", "CHOLHDL", "LDLDIRECT" in the last 72 hours. Thyroid Function Tests: No results for input(s): "TSH", "T4TOTAL", "FREET4", "T3FREE", "THYROIDAB" in the last 72 hours.  Anemia Panel: Recent Labs    12/12/21 0944  VITAMINB12 520  FOLATE 7.9  FERRITIN 658*  TIBC 149*  IRON 40    Sepsis Labs: Recent Labs  Lab 12/11/21 0044 12/12/21 0539 12/13/21 0454  PROCALCITON 1.12 0.64 0.36     Recent  Results (from the past 240 hour(s))  Culture, blood (Routine X 2) w Reflex to ID Panel     Status: None (Preliminary result)   Collection Time: 12/11/21 12:44 AM   Specimen: Right Antecubital; Blood  Result Value Ref Range Status   Specimen Description   Final    RIGHT ANTECUBITAL BLOOD Performed at [facility] [address] [place], [place] [address]    Special Requests   Final    BOTTLES DRAWN AEROBIC AND ANAEROBIC Blood Culture adequate volume Performed at [facility] [address], [place], [place] [address]    Culture   Final    NO GROWTH 3 DAYS Performed at [facility], [place] [address], [place], [place] [address]    Report Status PENDING  Incomplete  Culture, blood (Routine X 2) w Reflex to ID Panel     Status: None (Preliminary result)   Collection Time: 12/11/21 12:48 AM   Specimen: BLOOD RIGHT HAND  Result Value Ref Range Status   Specimen Description    Final    BLOOD RIGHT HAND Performed at [facility] [address] [place], [place] [address]    Special Requests   Final    BOTTLES DRAWN AEROBIC ONLY Blood Culture adequate volume Performed at [facility] [address] [place], [place] [address]    Culture   Final    NO GROWTH 3 DAYS Performed at [facility], [place] [address], [place], [place] [address]    Report Status PENDING  Incomplete  Urine Culture     Status: None   Collection Time: 12/11/21  5:22 AM   Specimen: Urine, Clean Catch  Result Value Ref Range Status   Specimen Description   Final    URINE, CLEAN CATCH Performed at [facility], [place] [address], [place], [place] [address]    Special Requests   Final    NONE Performed at [facility], [place] [address], [place], [place] [address]    Culture   Final    NO GROWTH Performed at [facility], [place] [address] [place],  [address]    Report Status 12/12/2021 FINAL  Final         Radiology Studies: No results found.      Scheduled Meds:  atorvastatin  40 mg Oral Daily   fluticasone  1 spray Each Nare Daily   heparin  5,000 Units Subcutaneous Q8H   hydrALAZINE  50 mg Oral TID   insulin aspart  0-15 Units Subcutaneous TID WC   insulin aspart  0-5 Units Subcutaneous QHS   isosorbide mononitrate  30 mg Oral q AM   lacosamide  225 mg Oral q morning   lacosamide  300 mg Oral QPM   levETIRAcetam  750 mg Oral BID   pantoprazole  20 mg Oral BID   sertraline  25 mg Oral Daily   sodium bicarbonate  650 mg Oral TID   Continuous Infusions:  sodium chloride 50 mL/hr at 12/13/21 2039     LOS: 4 days    Time spent: 35 minutes    Barb Merino, MD [facility] Pager [phone]

## 2021-12-14 NOTE — Interval H&P Note (Signed)
History and Physical Interval Note:  12/14/2021 12:49 PM  Michelle Farrell  has presented today for surgery, with the diagnosis of evaluation of vomiting / hematemesis.  The various methods of treatment have been discussed with the patient and family. After consideration of risks, benefits and other options for treatment, the patient has consented to  Procedure(s): ESOPHAGOGASTRODUODENOSCOPY (EGD) WITH PROPOFOL (N/A) as a surgical intervention.  The patient's history has been reviewed, patient examined, no change in status, stable for surgery.  I have reviewed the patient's chart and labs.  Questions were answered to the patient's satisfaction.     [employer]

## 2021-12-14 NOTE — Anesthesia Postprocedure Evaluation (Signed)
Anesthesia Post Note  Patient: Michelle Farrell  Procedure(s) Performed: ESOPHAGOGASTRODUODENOSCOPY (EGD) WITH PROPOFOL BIOPSY     Patient location during evaluation: PACU Anesthesia Type: MAC Level of consciousness: awake and alert Pain management: pain level controlled Vital Signs Assessment: post-procedure vital signs reviewed and stable Respiratory status: spontaneous breathing, nonlabored ventilation, respiratory function stable and patient connected to nasal cannula oxygen Cardiovascular status: stable and blood pressure returned to baseline Postop Assessment: no apparent nausea or vomiting Anesthetic complications: no   No notable events documented.  Last Vitals:  Vitals:   12/14/21 1510 12/14/21 1520  BP: (!) 145/41 (!) 152/45  Pulse: 60 62  Resp: (!) 28 (!) 23  Temp:    SpO2: 97% 99%    Last Pain:  Vitals:   12/14/21 1520  TempSrc:   PainSc: 0-No pain                 Effie Berkshire

## 2021-12-14 NOTE — Consult Note (Signed)
Renal Service Consult Note [facility]  Lainee Lehrman 12/14/2021 Sol Blazing, MD Requesting Physician: Dr. Raelyn Mora  Reason for Consult: Renal failure HPI: The patient is a 81 y.o. year-old w/ hx of AKI, COPD, CKD, Crohns disease, GERD, HTN, chronic pain, PUD, seizures, h/o cVA, OSA who was admitted to [facility] on 9/05 due to sepsis and UTI w/ ecoli bacteremia. Pt was rx'd w/ zosyn then narrowed to Rocephin. She developed persistent fevers then abx changed back to zosyn then fevers resolved. However pt's creat was 1.9 on admit (1.2- 1.3 baseline) but worsening up to 2.3 and so pt was transferred to [facility] for further assessment. Also WBC had worsened at the OSH up to 35K then finally improved prior to transfer. RUA 9/13 showed no obstruction and CT AP on 9/12 no acute findings (these were done at [facility]). Creat here has remained high at 2.5 today. Asked to see for renal failure.   Pt seen in room, sister at bedside. Pt poor historian.  Sister states she has not been eating/ drinking well for "weeks". No abd pain, no voiding c/o's.    ROS - denies CP, no joint pain, no HA, no blurry vision, no rash, no dysuria, no difficulty voiding   Past Medical History  Past Medical History:  Diagnosis Date   AKI (acute kidney injury) ([place])    Arthritis    Back pain, chronic    CHF (congestive heart failure) ([facility])    CKD (chronic kidney disease)    COPD (chronic obstructive pulmonary disease) ([place])    Crohn disease ([place])    Diabetes mellitus without complication ([place])    Esophageal stricture    s/p dilation 2003, 2008   GERD (gastroesophageal reflux disease)    Hip pain, chronic    Hypertension    PUD (peptic ulcer disease)    Seizures ([facility])    Sleep apnea    Stroke [facility])    Past Surgical History  Past Surgical History:  Procedure Laterality Date   ABDOMINAL HYSTERECTOMY     BLADDER SURGERY     CHOLECYSTECTOMY     ESOPHAGOGASTRODUODENOSCOPY N/A 07/08/2014   Procedure:  ESOPHAGOGASTRODUODENOSCOPY (EGD);  Surgeon: Ladene Artist, MD;  Location[facility] ENDOSCOPY;  Service: Endoscopy;  Laterality: N/A;   SKIN CANCER EXCISION     TONSILLECTOMY     Family History  Family History  Problem Relation Age of Onset   Ovarian cancer Maternal Grandmother    Stroke Mother    Stroke Brother    Social History  reports that she quit smoking about 7 years ago. Her smoking use included cigarettes. She has a 30.00 pack-year smoking history. She has never used smokeless tobacco. She reports that she does not drink alcohol and does not use drugs. Allergies  Allergies  Allergen Reactions   Tape Other (See Comments)    NO PLASTIC TAPE- Bubbles the skin!!   Tramadol Other (See Comments)    Seizures    Latex Rash   Home medications Prior to Admission medications   Medication Sig Start Date End Date Taking? Authorizing Provider  acetaminophen (TYLENOL) 325 MG tablet Take 325 mg by mouth every 6 (six) hours as needed for mild pain or headache.   Yes [provider]  atorvastatin (LIPITOR) 40 MG tablet Take 40 mg by mouth at bedtime.  08/16/16  Yes [provider]  diclofenac Sodium (VOLTAREN) 1 % GEL Apply 2 g topically in the morning and at bedtime.   Yes [provider]  fluticasone (FLONASE) 50 MCG/ACT nasal spray Place 1 spray into both nostrils daily.   Yes [provider]  hydrALAZINE (APRESOLINE) 25 MG tablet Take 50 mg by mouth 3 (three) times daily. 03/16/15  Yes [provider]  isosorbide mononitrate (IMDUR) 30 MG 24 hr tablet Take 30 mg by mouth in the morning.  04/14/15  Yes [provider]  lacosamide (VIMPAT) 50 MG TABS tablet Take 4.5 tablets (225 mg total) by mouth in the morning. 06/22/19  Yes Hosie Poisson, MD  lacosamide 150 MG TABS Take 2 tablets (300 mg total) by mouth at bedtime. 06/21/19  Yes Hosie Poisson, MD  levETIRAcetam (KEPPRA) 750 MG tablet Take 2 tablets (1,500 mg total) by mouth in the  morning. Patient taking differently: Take 1,500 mg by mouth 2 (two) times daily. 06/22/19  Yes Hosie Poisson, MD  ZOLOFT 25 MG tablet Take 25 mg by mouth daily. 09/07/21  Yes [provider]     Vitals:   12/14/21 1251 12/14/21 1503 12/14/21 1510 12/14/21 1520  BP: (!) 162/60 (!) 152/81 (!) 145/41 (!) 152/45  Pulse: 63 66 60 62  Resp: 16 (!) 21 (!) 28 (!) 23  Temp: (!) 97.5 F (36.4 C) 98 F (36.7 C)    TempSrc: Temporal Oral    SpO2: 98% 97% 97% 99%  Weight: 57.7 kg     Height: 5' 6"  (1.676 m)      Exam Gen alert, no distress, elderly AAF No rash, cyanosis or gangrene, poor skin turgor Sclera anicteric, throat clear  No jvd or bruits Chest clear bilat to bases, no rales/ wheezing RRR no MRG Abd soft ntnd no mass or ascites +bs GU defer MS no joint effusions or deformity Ext no LE or UE edema, no wounds or ulcers Neuro is alert, nonfocal, pleasant    Home meds include - atorvastatin, hydralazine 50 tid, isosorbide mononitrate 30 qd, lacosamide, levetiracetam, zoloft, prns/ vits/ supps     Date   Creat   eGFR    2010   1.03 October 2009  3.0 >> 1.1  AKI episode    2015   1.5 >> 0.9  AKI episode    Feb 2016  3.0 >> 1.0  AKI episode    Mar-April 2016 1.9 >> 0.8     AKI episode    April 2016  2.49 > 1.98  AKI episode    June 2016  1.35 >> 0.94  AKI episode    Oct 2018  1.40    Dec 2020  1.42    Mar 2021  1.06- 1.37    Aug 2022  1.14   49 ml/min, stage IIIa    12/11/21  2.38    9/17   2.44    9/18   2.44    12/14/21  2.59       UA 9/16 - mod Hb, prot 30, 21-50 rbc, >50 wbc, 0-5 epi, rare bact, large LE     BP's here normal to high, no hypotension     HR 60s  RR 18  afeb 98     UCx and BCx's here negative     Na 140  K 4.1  CO2 16  AG 6  BUN 24  Creat 2.5  Ca 9.3  Mg 1.9   Alb 3.3  LFT's ok     Hb 7- 9.4  WBC 13K        1 dose IV zyvox given 9/16 ,  then dc'd       IVF"s at 50 cc /hr since 9/16      Getting hydralazine, imdur po here since 9/16 date of  admit    Assessment/ Plan: AKI on CKD 3a - b/l creatinine 1.14 from Aug 2022, eGFR 49 ml/min. Hx of multiple episodes of AKI in the past (7-12 yrs ago). CT/ renal US done at OSH showed no obstruction reportedly. UA here looks infected. Pt not eating well for "weeks". Suspect she is vol depleted. No contrast. Did get coarse of cephalosporin abx, could have AIN. BP's are good, no hypotension. Will get urine lytes and repeat renal US here. Consider foley. Will give 2 L bolus NS and ^ IVF's to 100 cc /hr. Will follow.  Ecoli urosepsis - sp round of IV zosyn HTN - cont hydralazine and imdur H/o CVA  Seizure d/o DM2 - off medication SP PPM Anemia chronic disease - w/ Hb drift, GI is seeing, for upper endoscopy today.       Rob Marquisa Salih  MD 12/14/2021, 3:50 PM Recent Labs  Lab 12/11/21 0044 12/12/21 0539 12/13/21 0759 12/14/21 0532  HGB 8.8* 8.5* 7.0* 9.4*  ALBUMIN 2.0* 2.0*  --   --   CALCIUM 9.2 9.0 8.2* 9.3  PHOS 3.8 3.3  --   --   CREATININE 2.38* 2.44* 2.44* 2.59*  K 4.3 3.9 3.9 4.1   Inpatient medications:  atorvastatin  40 mg Oral Daily   fluticasone  1 spray Each Nare Daily   heparin  5,000 Units Subcutaneous Q8H   hydrALAZINE  50 mg Oral TID   insulin aspart  0-15 Units Subcutaneous TID WC   insulin aspart  0-5 Units Subcutaneous QHS   isosorbide mononitrate  30 mg Oral q AM   lacosamide  225 mg Oral q morning   lacosamide  300 mg Oral QPM   levETIRAcetam  750 mg Oral BID   pantoprazole  40 mg Oral BID AC   sertraline  25 mg Oral Daily   sodium bicarbonate  650 mg Oral TID   sucralfate  1 g Oral TID WC & HS    sodium chloride 50 mL/hr at 12/14/21 1324   acetaminophen **OR** acetaminophen, ondansetron **OR** ondansetron (ZOFRAN) IV

## 2021-12-14 NOTE — Progress Notes (Signed)
Patient for EGD this afternoon, hold morning dose of heparin per on-call provider Lorra Hals, NP).

## 2021-12-14 NOTE — Op Note (Signed)
[facility] Patient Name: Michelle Farrell Procedure Date: 12/14/2021 MRN: [id number] Attending MD: Justice Britain , MD Date of Birth: 1940/04/05 CSN: [id number] Age: 81 Admit Type: Inpatient Procedure:                Upper GI endoscopy Indications:              Iron deficiency anemia secondary to chronic blood                            loss, Hematemesis Providers:                Justice Britain, MD, Jearld Lesch RN, RN,                            Cherylynn Ridges, Technician, Luane School CRNA Referring MD:             Mir Marry Guan, Inpatient Medical team Medicines:                Monitored Anesthesia Care Complications:            No immediate complications. Estimated Blood Loss:     Estimated blood loss was minimal. Procedure:                Pre-Anesthesia Assessment:                           - Prior to the procedure, a History and Physical                            was performed, and patient medications and                            allergies were reviewed. The patient's tolerance of                            previous anesthesia was also reviewed. The risks                            and benefits of the procedure and the sedation                            options and risks were discussed with the patient.                            All questions were answered, and informed consent                            was obtained. Prior Anticoagulants: The patient has                            taken no previous anticoagulant or antiplatelet                            agents. ASA Grade Assessment: III - A patient with  severe systemic disease. After reviewing the risks                            and benefits, the patient was deemed in                            satisfactory condition to undergo the procedure.                           After obtaining informed consent, the endoscope was                            passed under  direct vision. Throughout the                            procedure, the patient's blood pressure, pulse, and                            oxygen saturations were monitored continuously. The                            GIF-H190 ([id number] Olympus endoscope was introduced                            through the mouth, and advanced to the second part                            of duodenum. The upper GI endoscopy was                            accomplished without difficulty. The patient                            tolerated the procedure. Scope In: Scope Out: Findings:      No gross lesions were noted in the proximal esophagus and in the mid       esophagus (short esophagus overall).      LA Grade D (one or more mucosal breaks involving at least 75% of       esophageal circumference) esophagitis with no bleeding was found in the       distal esophagus (22-30 cm). Biopsies were taken with a cold forceps for       histology.      The Z-line was irregular and was found 30 cm from the incisors.      A 9 cm hiatal hernia was present.      Patchy mildly erythematous mucosa without bleeding was found in the       entire examined stomach. Biopsies were taken with a cold forceps for       histology and Helicobacter pylori testing.      Diffuse moderate mucosal changes characterized by granularity and       altered texture were found in the duodenal bulb. Biopsies were taken       with a cold forceps for histology.      No other gross lesions were noted in the second portion of the duodenum. Impression:               -  No gross lesions in esophagus proximally.                           - LA Grade D erosive esophagitis with no bleeding                            distally. Biopsied.                           - Z-line irregular, 30 cm from the incisors.                           - 9 cm hiatal hernia.                           - Erythematous mucosa in the stomach. Biopsied.                           -  Mucosal changes in the duodenum bulb. Biopsied.                           - No gross lesions in the second portion of the                            duodenum. Moderate Sedation:      Not Applicable - Patient had care per Anesthesia. Recommendation:           - The patient will be observed post-procedure,                            until all discharge criteria are met.                           - Return patient to hospital ward for ongoing care.                           - Advance diet as tolerated.                           - Recommend PO PPI 40 mg twice daily.                           - Start Carafate BID at minimum but if can tolerate                            QAC, then I think this will be most helpful for her                            (and the liquid format will be important).                           - Observe patient's clinical course.                           - Await  pathology results.                           - Recommend considering repeat upper endoscopy in 4                            months to check healing (which could be done by                            primary GI at [facility]).                           - Inpatient GI team will signoff at this time.                            Please call if questions arise.                           - The findings and recommendations were discussed                            with the patient.                           - The findings and recommendations were discussed                            with the patient's family.                           - The findings and recommendations were discussed                            with the referring physician. Procedure Code(s):        --- Professional ---                           [phone], Esophagogastroduodenoscopy, flexible,                            transoral; with biopsy, single or multiple Diagnosis Code(s):        --- Professional ---                           K20.80, Other esophagitis  without bleeding                           K22.8, Other specified diseases of esophagus                           K44.9, Diaphragmatic hernia without obstruction or                            gangrene                           K31.89, Other diseases  of stomach and duodenum                           D50.0, Iron deficiency anemia secondary to blood                            loss (chronic)                           K92.0, Hematemesis CPT copyright 2019 American Medical Association. All rights reserved. The codes documented in this report are preliminary and upon coder review may  be revised to meet current compliance requirements. Justice Britain, MD 12/14/2021 3:09:03 PM Number of Addenda: 0

## 2021-12-14 NOTE — Plan of Care (Signed)
?  Problem: Coping: ?Goal: Level of anxiety will decrease ?Outcome: Progressing ?  ?Problem: Safety: ?Goal: Ability to remain free from injury will improve ?Outcome: Progressing ?  ?

## 2021-12-15 ENCOUNTER — Encounter (HOSPITAL_COMMUNITY): Payer: Self-pay | Admitting: Gastroenterology

## 2021-12-15 DIAGNOSIS — G40909 Epilepsy, unspecified, not intractable, without status epilepticus: Secondary | ICD-10-CM | POA: Diagnosis not present

## 2021-12-15 DIAGNOSIS — N179 Acute kidney failure, unspecified: Secondary | ICD-10-CM | POA: Diagnosis not present

## 2021-12-15 DIAGNOSIS — I69398 Other sequelae of cerebral infarction: Secondary | ICD-10-CM | POA: Diagnosis not present

## 2021-12-15 DIAGNOSIS — D649 Anemia, unspecified: Secondary | ICD-10-CM

## 2021-12-15 LAB — CBC WITH DIFFERENTIAL/PLATELET
Abs Immature Granulocytes: 0.22 10*3/uL — ABNORMAL HIGH (ref 0.00–0.07)
Basophils Absolute: 0 10*3/uL (ref 0.0–0.1)
Basophils Relative: 0 %
Eosinophils Absolute: 0.1 10*3/uL (ref 0.0–0.5)
Eosinophils Relative: 1 %
HCT: 27.9 % — ABNORMAL LOW (ref 36.0–46.0)
Hemoglobin: 9.4 g/dL — ABNORMAL LOW (ref 12.0–15.0)
Immature Granulocytes: 2 %
Lymphocytes Relative: 14 %
Lymphs Abs: 1.8 10*3/uL (ref 0.7–4.0)
MCH: 29.9 pg (ref 26.0–34.0)
MCHC: 33.7 g/dL (ref 30.0–36.0)
MCV: 88.9 fL (ref 80.0–100.0)
Monocytes Absolute: 1 10*3/uL (ref 0.1–1.0)
Monocytes Relative: 8 %
Neutro Abs: 9.8 10*3/uL — ABNORMAL HIGH (ref 1.7–7.7)
Neutrophils Relative %: 75 %
Platelets: 338 10*3/uL (ref 150–400)
RBC: 3.14 MIL/uL — ABNORMAL LOW (ref 3.87–5.11)
RDW: 16.7 % — ABNORMAL HIGH (ref 11.5–15.5)
WBC: 12.9 10*3/uL — ABNORMAL HIGH (ref 4.0–10.5)
nRBC: 0 % (ref 0.0–0.2)

## 2021-12-15 LAB — BASIC METABOLIC PANEL
Anion gap: 5 (ref 5–15)
BUN: 18 mg/dL (ref 8–23)
CO2: 17 mmol/L — ABNORMAL LOW (ref 22–32)
Calcium: 8.8 mg/dL — ABNORMAL LOW (ref 8.9–10.3)
Chloride: 117 mmol/L — ABNORMAL HIGH (ref 98–111)
Creatinine, Ser: 2.14 mg/dL — ABNORMAL HIGH (ref 0.44–1.00)
GFR, Estimated: 23 mL/min — ABNORMAL LOW (ref >60)
Glucose, Bld: 119 mg/dL — ABNORMAL HIGH (ref 70–99)
Potassium: 3.9 mmol/L (ref 3.5–5.1)
Sodium: 139 mmol/L (ref 135–145)

## 2021-12-15 LAB — GLUCOSE, CAPILLARY
Glucose-Capillary: 103 mg/dL — ABNORMAL HIGH (ref 70–99)
Glucose-Capillary: 162 mg/dL — ABNORMAL HIGH (ref 70–99)
Glucose-Capillary: 74 mg/dL (ref 70–99)
Glucose-Capillary: 87 mg/dL (ref 70–99)

## 2021-12-15 MED ORDER — DICLOFENAC SODIUM 1 % EX GEL
4.0000 g | Freq: Three times a day (TID) | CUTANEOUS | Status: DC
  Administered 2021-12-15 – 2021-12-16 (×4): 4 g via TOPICAL
  Filled 2021-12-15: qty 100

## 2021-12-15 MED ORDER — SODIUM BICARBONATE 650 MG PO TABS
650.0000 mg | ORAL_TABLET | Freq: Two times a day (BID) | ORAL | Status: DC
  Administered 2021-12-15 – 2021-12-16 (×3): 650 mg via ORAL
  Filled 2021-12-15 (×3): qty 1

## 2021-12-15 NOTE — Progress Notes (Signed)
PROGRESS NOTE   Michelle Farrell  VOH:[id number]    DOB: Jul 06, 1940    DOA: 12/10/2021  PCP: Doretha Sou, MD   I have briefly reviewed patients previous medical records in [facility].   Brief Narrative:  81 year old with history of seizure disorder on Keppra and Vimpat, stroke, ambulatory dysfunction, hypertension, sinus bradycardia status post pacemaker, hyperlipidemia admitted to [facility] on 9/5 with confusion due to sepsis from pansensitive E. coli UTI & bacteremia. Initially treated with Zosyn then converted to Rocephin.  Despite this she had persistent fevers, antibiotics changed back to Zosyn on 9/13 with no recurrence of fevers.  She also had AKI with creatinine initially 1.9 (1.2-1.3 baseline), initially improved with IV fluids and then worsened again to 2.3 on 9/16.  WBC which was up to 35 K on 9/13 had been coming down.  CT A/P on 9/12: No acute findings.  Renal US 9/13: No obstruction.  Flu, RVP, COVID negative.  Patient was transferred to [facility] for persistent fevers and higher level of care.   Assessment & Plan:  Principal Problem:   Persistent fever Active Problems:   AKI (acute kidney injury) ([place])   Seizure disorder as sequela of cerebrovascular accident ([place])   Diabetes mellitus ([place])   Hematemesis with nausea   Nausea and vomiting   Normocytic anemia   History of Crohn's disease   Pansensitive E. coli UTI and bacteremia: Per H&P, had sepsis at OSH but without sepsis features on this hospital admission.  Had 10-day stay at [facility], flu, RVP negative.  CT A/P negative on 9/12.  Transferred from OSH to [facility] due to persistent fevers.  There was some discussion about ID consultation in H&P but appears not to be consulted.  Received a dose of IV Zosyn on 9/15 and has been off antibiotics since then.  Has had no fevers since hospital admission.  Leukocytosis has improved from 24.2 on admission to 12.9 on 9/19.  We will follow CBC in AM.  Blood  cultures x2 from 9/15: NTD.  Urine culture 9/15: Negative.?  Fevers due to antibiotics.  Improved.  Acute kidney injury complicating CKD stage IIIa: Nephrology consultation and follow-up appreciated.  As per them, baseline creatinine 1.14 from August 2022, EGFR of 49 mL/min.  CT/renal ultrasound done at OSH without obstruction.  Repeat renal ultrasound here on 9/19 without obstructive uropathy.  Patient not eating well for weeks and suspecting volume depletion on admission.  Although got cephalosporins at OSH, AIN felt less likely.  Bolused with 2.5 L IVF on 9/19 followed by maintenance IVF at 100 mL/h, has excellent urine output and creatinine down from 2.59-2.14.  Continuing IVF.  Avoid nephrotoxins.  Trend daily BMP.  None anion gap metabolic acidosis: Remains on oral bicarbonate  Seizure disorder: No seizures noted.  AEDs were adjusted to renal insufficiency on admission after admitting MD discussed with neurology.  Will need to adjust them back based on improvement of renal functions.  Currently on Keppra 750 Mg twice daily and Vimpat 225 Mg in the morning and 300 Mg in the evening.  History of CVA: Reportedly not on antiplatelets PTA.  Type II DM: A1c 7.4 suggesting suboptimal outpatient control.  Reportedly not on treatment at home.  Reasonably controlled on SSI here.  Essential hypertension: Reasonably controlled on hydralazine and Imdur.  Hyperlipidemia: Continue statins.  S/p PPM: Atrial paced rhythm in the 60s on monitor.  Outpatient follow-up with primary cardiologist.  LA grade D erosive esophagitis distally, gastritis:  [place] [facility] consultation and follow-up appreciated.  S/p EGD 9/19 with findings as noted.  Recommending p.o. PPI 40 mg twice daily and Carafate slurry before every meal if can tolerate but at least twice daily-given AKI, will need to monitor closely.  GI will follow pathology results and they recommend repeating EGD in 4 months to check healing which can be done by her  primary GI at [facility].  Acute on chronic anemia: Presented with hemoglobin of 8.8 which dropped to a low of 7 g per DL on 9/18.  Improved posttransfusion and stable at 9.4.  Follow CBC daily.  As per earlier progress notes, hemoglobin dropped from 13-7 over a span of 2 weeks.  S/p 1 unit PRBC.  EGD results as above.  Iron, B12 and folate levels normal.  Neck and bilateral shoulder pain: Voltaren gel.  Body mass index is 20.46 kg/m.    DVT prophylaxis: heparin injection 5,000 Units Start: 12/11/21 0600     Code Status: Full Code:  Family Communication: Discussed in detail with patient's granddaughter (nurse practitioner) via phone, updated care and answered all questions. Disposition:  Status is: Inpatient Remains inpatient appropriate because: AKI with ongoing IV fluids.     Consultants:   [facility]  Procedures:   EGD 9/19 with summary results as noted above.  Antimicrobials:   IV Zosyn x1 on 9/16   Subjective:  Seen late morning.  Sitting on chair and napping.  Arousable but poor historian.  States that she feels okay and denies complaints.  As per RN communication and discussion with patient's granddaughter, reports neck and bilateral shoulder pain for which she takes Voltaren 3 times daily at home and requesting same to be resumed.  Granddaughter feels that patient's mental status is at baseline and was communicating with her well earlier this morning.  Objective:   Vitals:   12/15/21 0001 12/15/21 0456 12/15/21 0500 12/15/21 1408  BP: (!) 149/59 (!) 151/56  (!) 154/61  Pulse: (!) 59 (!) 58  (!) 59  Resp:  16  18  Temp:  98.3 F (36.8 C)  (!) 97.5 F (36.4 C)  TempSrc:  Oral  Oral  SpO2:  100%  100%  Weight:   57.5 kg   Height:        General exam: Elderly female, moderately built and nourished sitting up comfortably in reclining chair without distress. Respiratory system: Slightly diminished breath sounds in the bases but otherwise clear to auscultation  without wheezing, rhonchi or crackles.  No increased work of breathing. Cardiovascular system: S1 & S2 heard, RRR. No JVD, murmurs, rubs, gallops or clicks. No pedal edema.  Telemetry personally reviewed: Atrial paced rhythm in the 60s. Gastrointestinal system: Abdomen is nondistended, soft and nontender. No organomegaly or masses felt. Normal bowel sounds heard. Central nervous system: Sleeping but easily aroused, oriented x2.  No focal neurological deficits. Extremities: Symmetric 5 x 5 power. Skin: No rashes, lesions or ulcers Psychiatry: Judgement and insight appear normal. Mood & affect appropriate.     Data Reviewed:   I have personally reviewed following labs and imaging studies   CBC: Recent Labs  Lab 12/13/21 0759 12/14/21 0532 12/15/21 0524  WBC 11.7* 13.6* 12.9*  NEUTROABS 9.0* 10.4* 9.8*  HGB 7.0* 9.4* 9.4*  HCT 21.0* 28.0* 27.9*  MCV 89.7 88.1 88.9  PLT 358 386 202    Basic Metabolic Panel: Recent Labs  Lab 12/11/21 0044 12/12/21 0539 12/13/21 0759 12/14/21 0532 12/15/21 0524  NA 136 134* 140  140 139  K 4.3 3.9 3.9 4.1 3.9  CL 112* 111 120* 118* 117*  CO2 17* 16* 14* 16* 17*  GLUCOSE 145* 166* 120* 126* 119*  BUN 33* 29* 23 24* 18  CREATININE 2.38* 2.44* 2.44* 2.59* 2.14*  CALCIUM 9.2 9.0 8.2* 9.3 8.8*  MG 2.0 1.9  --  1.9  --   PHOS 3.8 3.3  --   --   --     Liver Function Tests: Recent Labs  Lab 12/11/21 0044 12/12/21 0539  AST 46* 34  ALT 34 34  ALKPHOS 74 66  BILITOT 0.7 0.4  PROT 6.5 6.3*  ALBUMIN 2.0* 2.0*    CBG: Recent Labs  Lab 2022-01-10 2141 12/15/21 0729 12/15/21 1144  GLUCAP 172* 103* 162*    Microbiology Studies:   Recent Results (from the past 240 hour(s))  Culture, blood (Routine X 2) w Reflex to ID Panel     Status: None (Preliminary result)   Collection Time: 12/11/21 12:44 AM   Specimen: Right Antecubital; Blood  Result Value Ref Range Status   Specimen Description   Final    RIGHT ANTECUBITAL BLOOD Performed  at [facility], [address], [place], [place] [address]    Special Requests   Final    BOTTLES DRAWN AEROBIC AND ANAEROBIC Blood Culture adequate volume Performed at [facility] [address], [place], [place] [address]    Culture   Final    NO GROWTH 4 DAYS Performed at [facility], [place] [address], [place], [place] [address]    Report Status PENDING  Incomplete  Culture, blood (Routine X 2) w Reflex to ID Panel     Status: None (Preliminary result)   Collection Time: 12/11/21 12:48 AM   Specimen: BLOOD RIGHT HAND  Result Value Ref Range Status   Specimen Description   Final    BLOOD RIGHT HAND Performed at [facility] [address], [place], [place] [address]    Special Requests   Final    BOTTLES DRAWN AEROBIC ONLY Blood Culture adequate volume Performed at [facility] [address], [place], [place] [address]    Culture   Final    NO GROWTH 4 DAYS Performed at [facility], [place] [address], [place], [place] [address]    Report Status PENDING  Incomplete  Urine Culture     Status: None   Collection Time: 12/11/21  5:22 AM   Specimen: Urine, Clean Catch  Result Value Ref Range Status   Specimen Description   Final    URINE, CLEAN CATCH Performed at [facility], [place] [address], [place], [place] [address]    Special Requests   Final    NONE Performed at [facility], [place] [address] [place], [place] [address]    Culture   Final    NO GROWTH Performed at [facility], [place] [address] [place],  [address]    Report Status 12/12/2021 FINAL  Final    Radiology Studies:  US RENAL  Result Date: 10-Jan-2022 CLINICAL DATA:  Acute renal failure superimposed on stage 3 chronic kidney disease. EXAM: RENAL / URINARY TRACT ULTRASOUND COMPLETE COMPARISON:  Renal ultrasound 6 days ago 12/08/2021 [facility], noncontrast CT 12/07/2021.  FINDINGS: Right Kidney: Renal measurements: 10.1 x 5.1 x 5.6 cm = volume: 152 mL. No hydronephrosis. Diffusely increased renal parenchymal echogenicity. Simple cyst measures 2.3 cm from the mid  lower pole. No specific imaging follow-up is recommended. Additional cyst on prior ultrasound not well seen on the current exam. No visible renal calculi or solid lesion. Left Kidney: Renal measurements: 11.9 x 7 x 6.8 cm = volume: 294 mL. Increased renal parenchymal echogenicity. No hydronephrosis. Simple cyst arising from the mid kidney measures 2.9 cm. Additional 1.4 cm cyst in the lower pole. No specific imaging follow-up is recommended. No renal calculi or solid lesion. Bladder: Appears normal for degree of bladder distention. Previous bladder debris not seen on the current exam Other: None. IMPRESSION: 1. Increased renal parenchymal echogenicity consistent with chronic medical renal disease. 2. No obstructive uropathy. Electronically Signed   By: Keith Rake M.D.   On: 12/14/2021 20:12    Scheduled Meds:    atorvastatin  40 mg Oral Daily   diclofenac Sodium  4 g Topical TID   fluticasone  1 spray Each Nare Daily   heparin  5,000 Units Subcutaneous Q8H   hydrALAZINE  50 mg Oral TID   insulin aspart  0-15 Units Subcutaneous TID WC   insulin aspart  0-5 Units Subcutaneous QHS   isosorbide mononitrate  30 mg Oral q AM   lacosamide  225 mg Oral q morning   lacosamide  300 mg Oral QPM   levETIRAcetam  750 mg Oral BID   pantoprazole  40 mg Oral BID AC   sertraline  25 mg Oral Daily   sodium bicarbonate  650 mg Oral BID   sucralfate  1 g Oral TID WC & HS    Continuous Infusions:    sodium chloride 100 mL/hr at 12/15/21 1341     LOS: 5 days     Vernell Leep, MD,  FACP, [facility], [facility], [facility] (Care Management Physician Certified) [place]  To contact the attending provider between 7A-7P or the covering provider during after hours 7P-7A, please log into  the web site [email or web address] and access using universal [place] password for that web site. If you do not have the password, please call the hospital operator.  12/15/2021, 3:13 PM  1

## 2021-12-15 NOTE — Progress Notes (Signed)
Occupational Therapy Treatment Patient Details Name: Michelle Farrell MRN: [id number] DOB: [birth year] Today's Date: 12/15/2021   History of present illness Patienet is a 81 year old female who was admitted to the hospital from ALF with E coli bacteremia UTI, persistent fever, and AKI. PMH: seizure disorder, ambulatory dysfunction, HTN, sinus bradycardia s/p pacemaker, hyperlipidemia, h/o CVA, DM II   OT comments  Pt initially presented with lethargy, however became more alert as the session progressed. She progressed to performing a stand-pivot transfer to the bedside commode for toileting. She was able to perform two stands using a rolling walker, though she continues to require increased assist in this regard. She is making gradual functional progress. She will continue to benefit from OT services to facilitate improved ADL performance & to decrease the risk for further weakness and deconditioning.    Recommendations for follow up therapy are one component of a multi-disciplinary discharge planning process, led by the attending physician.  Recommendations may be updated based on patient status, additional functional criteria and insurance authorization.    Follow Up Recommendations  Skilled nursing-short term rehab (<3 hours/day)    Assistance Recommended at Discharge Frequent or constant Supervision/Assistance  Patient can return home with the following  A lot of help with bathing/dressing/bathroom;A lot of help with walking and/or transfers         Precautions / Restrictions Precautions Precautions: Fall Restrictions Weight Bearing Restrictions: No       Mobility Bed Mobility    Not assessed, as the pt was seated in the bedside chair at the start of the session                 Transfers Overall transfer level: Needs assistance Equipment used: Rolling walker (2 wheels) Transfers: Sit to/from Stand Sit to Stand: Mod assist           General transfer comment: 2 stands  performed with use of RW for support; verbal cues needed for upright posture in standing and for B LE positioning                 ADL either performed or assessed with clinical judgement   ADL   Eating/Feeding: Set up Eating/Feeding Details (indicate cue type and reason): She drank water from a cup seated in the bedside chair. Grooming: Set up Grooming Details (indicate cue type and reason): She performed face washing seated in the bedside chair.           Upper Body Dressing Details (indicate cue type and reason): She required assist to doff a hospital gown, then to don another clean one while in sitting. Lower Body Dressing: Maximal assistance   Toilet Transfer: Minimal assistance;Cueing for sequencing;Stand-pivot;BSC/3in1;Rolling walker (2 wheels)   Toileting- Clothing Manipulation and Hygiene: Maximal assistance;Sit to/from stand Toileting - Clothing Manipulation Details (indicate cue type and reason): Pt required increased assist for posterior peri-hygiene in standing; she was instructed to hold onto RW for added support in standing                       Cognition Arousal/Alertness: Awake/alert                         Pertinent Vitals/ Pain       Pain Assessment Pain Assessment: No/denies pain     Prior Functioning/Environment              Frequency  Min 2X/week  Progress Toward Goals  OT Goals(current goals can now be found in the care plan section)  Progress towards OT goals: Progressing toward goals  Acute Rehab OT Goals Patient Stated Goal: to get better OT Goal Formulation: With patient Time For Goal Achievement: 12/26/21 Potential to Achieve Goals: Good  Plan Discharge plan remains appropriate       AM-PAC OT "6 Clicks" Daily Activity     Outcome Measure   Help from another person eating meals?: A Little Help from another person taking care of personal grooming?: A Little Help from another person toileting, which  includes using toliet, bedpan, or urinal?: A Lot Help from another person bathing (including washing, rinsing, drying)?: A Lot Help from another person to put on and taking off regular upper body clothing?: A Little Help from another person to put on and taking off regular lower body clothing?: A Lot 6 Click Score: 15    End of Session Equipment Utilized During Treatment: Gait belt;Rolling walker (2 wheels)  OT Visit Diagnosis: Unsteadiness on feet (R26.81);Muscle weakness (generalized) (M62.81)   Activity Tolerance Patient tolerated treatment well   Patient Left in chair;with call bell/phone within reach;with chair alarm set; student nurse present in room   Nurse Communication          Time: 1130-1155 OT Time Calculation (min): 25 min  Charges: OT General Charges $OT Visit: 1 Visit OT Treatments $Self Care/Home Management : 8-22 mins $Therapeutic Activity: 8-22 mins    Leota Sauers, OTR/L 12/15/2021, 12:11 PM

## 2021-12-15 NOTE — Progress Notes (Signed)
Physical Therapy Treatment Patient Details Name: Michelle Farrell MRN: [id number] DOB: February 19, 1941 Today's Date: 12/15/2021   History of Present Illness Patienet is a 81 year old female who was admitted to the hospital from ALF with E coli bacteremia UTI, persistent fever, and AKI. PMH: seizure disorder, ambulatory dysfunction, HTN, sinus bradycardia s/p pacemaker, hyperlipidemia, h/o CVA, DM II    PT Comments    AxO x 3 very pleasant lady from an ALF facility. Assisted OOB to amb was difficult.  General bed mobility comments: assisted OOB required increased time and Min Assist to complete scooting using bed pad. General transfer comment: required Mod Assist to rise from elevated bed with 50% VC's.  General Gait Details: Limited amb distance 12 feet Mod/Min Assist with recliner following for safety.  Distance limited by Max c/o fatigue/weakness. Pt will need ST Rehab at SNF to address mobility and functional decline prior to safely returning home.   Recommendations for follow up therapy are one component of a multi-disciplinary discharge planning process, led by the attending physician.  Recommendations may be updated based on patient status, additional functional criteria and insurance authorization.  Follow Up Recommendations  Skilled nursing-short term rehab (<3 hours/day) Can patient physically be transported by private vehicle: No   Assistance Recommended at Discharge    Patient can return home with the following A lot of help with walking and/or transfers;A lot of help with bathing/dressing/bathroom   Equipment Recommendations  None recommended by PT    Recommendations for Other Services       Precautions / Restrictions Precautions Precautions: Fall Restrictions Weight Bearing Restrictions: No     Mobility  Bed Mobility Overal bed mobility: Needs Assistance Bed Mobility: Supine to Sit     Supine to sit: Min assist     General bed mobility comments: assisted OOB  required increased time and Min Assist to complete scooting using bed pad.    Transfers Overall transfer level: Needs assistance Equipment used: Rolling walker (2 wheels), None Transfers: Sit to/from Stand Sit to Stand: Mod assist           General transfer comment: required Mod Assist to rise from elevated bed with 50% VC's    Ambulation/Gait Ambulation/Gait assistance: Min assist, Mod assist Gait Distance (Feet): 12 Feet Assistive device: Rolling walker (2 wheels) Gait Pattern/deviations: Step-to pattern, Trunk flexed, Decreased step length - left, Decreased step length - right Gait velocity: decreased     General Gait Details: Limited amb distance 12 feet Mod/Min Assist with recliner following for safety.  Distance limited by Max c/o fatigue/weakness.   Stairs             Wheelchair Mobility    Modified Rankin (Stroke Patients Only)       Balance                                            Cognition Arousal/Alertness: Awake/alert Behavior During Therapy: WFL for tasks assessed/performed Overall Cognitive Status: Within Functional Limits for tasks assessed                                 General Comments: AxO x 3 very pleasant [facility]        Exercises      General Comments        Pertinent Vitals/Pain Pain  Assessment Pain Assessment: Faces Faces Pain Scale: Hurts a little bit Pain Location: general Pain Descriptors / Indicators: Aching, Grimacing Pain Intervention(s): Monitored during session, Repositioned    Home Living                          Prior Function            PT Goals (current goals can now be found in the care plan section) Progress towards PT goals: Progressing toward goals    Frequency    Min 2X/week      PT Plan Current plan remains appropriate    Co-evaluation              AM-PAC PT "6 Clicks" Mobility   Outcome Measure  Help needed turning from your back to  your side while in a flat bed without using bedrails?: A Lot Help needed moving from lying on your back to sitting on the side of a flat bed without using bedrails?: A Lot Help needed moving to and from a bed to a chair (including a wheelchair)?: A Lot Help needed standing up from a chair using your arms (e.g., wheelchair or bedside chair)?: A Lot Help needed to walk in hospital room?: A Lot Help needed climbing 3-5 steps with a railing? : Total 6 Click Score: 11    End of Session Equipment Utilized During Treatment: Gait belt Activity Tolerance: Patient tolerated treatment well Patient left: with call bell/phone within reach;in bed;with bed alarm set Nurse Communication: Mobility status PT Visit Diagnosis: Difficulty in walking, not elsewhere classified (R26.2)     Time: 3009-2330 PT Time Calculation (min) (ACUTE ONLY): 24 min  Charges:  $Gait Training: 8-22 mins $Therapeutic Activity: 8-22 mins                     Rica Koyanagi  PTA [place] [facility] M-F          [phone] Weekend pager [phone]

## 2021-12-15 NOTE — Progress Notes (Signed)
[facility] Progress Note  Subjective: creat down 2.1 today, BP's stable slightly high  Vitals:   12/14/21 2141 12/15/21 0001 12/15/21 0456 12/15/21 0500  BP: (!) 186/67 (!) 149/59 (!) 151/56   Pulse: (!) 59 (!) 59 (!) 58   Resp: 18  16   Temp: 98.5 F (36.9 C)  98.3 F (36.8 C)   TempSrc:   Oral   SpO2: 100%  100%   Weight:    57.5 kg  Height:        Exam: Gen alert, no distress, elderly AAF No jvd or bruits Chest clear bilat to bases RRR no MRG Abd soft ntnd no mass or ascites +bs Ext no LE edema Neuro is alert, nonfocal, pleasant     Home meds include - atorvastatin, hydralazine 50 tid, isosorbide mononitrate 30 qd, lacosamide, levetiracetam, zoloft, prns/ vits/ supps      Date                          Creat                           eGFR    2010                         1.03 October 2009                  3.0 >> 1.1                    AKI episode    2015                         1.5 >> 0.9                    AKI episode    Feb 2016                  3.0 >> 1.0                    AKI episode    Mar-April 2016         1.9 >> 0.8                    AKI episode    April 2016                 2.49 > 1.98                  AKI episode    June 2016                1.35 >> 0.94                AKI episode    Oct 2018                  1.40    Dec 2020                  1.42    Mar 2021                  1.06- 1.37    Aug 2022                  1.14  49 ml/min, stage IIIa    12/11/21                     2.38    9/17                          2.44    9/18                          2.44    12/14/21                     2.59             UA 9/16 - mod Hb, prot 30, 21-50 rbc, >50 wbc, 0-5 epi, rare bact, large LE     BP's here normal to high, no hypotension     HR 60s  RR 18  afeb 98     UCx and BCx's here negative   Assessment/ Plan: AKI on CKD 3a - b/l creatinine 1.14 from Aug 2022, eGFR 49 ml/min. Hx of multiple episodes of AKI in the past (7-12  yrs ago). CT/ renal US done at OSH showed no obstruction reportedly. UA here looks infected. Pt not eating well for "weeks". Suspect she is vol depleted. Did get coarse of cephalosporin abx, could have AIN but less likely. We gave 2.5 L bolus yesterday and ^'d IVF"s to 100 cc/hr, today has 1725 cc UOP recorded and creat down to 2.1, from 2.6 yesterday. Responding to IVF"s. Will cont at 100 cc/hr.  Ecoli urosepsis - sp course of IV zosyn HTN - cont hydralazine and imdur, Bp's good H/o CVA  Seizure d/o SP PPM Anemia chronic disease - Hb 8-9s today, GI is seeing.          Rob Ortencia Askari 12/15/2021, 10:14 AM   Recent Labs  Lab 12/11/21 0044 12/12/21 0539 12/13/21 0759 12/14/21 0532 12/15/21 0524  HGB 8.8* 8.5*   < > 9.4* 9.4*  ALBUMIN 2.0* 2.0*  --   --   --   CALCIUM 9.2 9.0   < > 9.3 8.8*  PHOS 3.8 3.3  --   --   --   CREATININE 2.38* 2.44*   < > 2.59* 2.14*  K 4.3 3.9   < > 4.1 3.9   < > = values in this interval not displayed.   Recent Labs  Lab 12/12/21 0944  IRON 40  TIBC 149*  FERRITIN 658*   Inpatient medications:  atorvastatin  40 mg Oral Daily   fluticasone  1 spray Each Nare Daily   heparin  5,000 Units Subcutaneous Q8H   hydrALAZINE  50 mg Oral TID   insulin aspart  0-15 Units Subcutaneous TID WC   insulin aspart  0-5 Units Subcutaneous QHS   isosorbide mononitrate  30 mg Oral q AM   lacosamide  225 mg Oral q morning   lacosamide  300 mg Oral QPM   levETIRAcetam  750 mg Oral BID   pantoprazole  40 mg Oral BID AC   sertraline  25 mg Oral Daily   sodium bicarbonate  650 mg Oral BID   sucralfate  1 g Oral TID WC & HS    sodium chloride 100 mL/hr at 12/15/21 0459   acetaminophen **OR** acetaminophen, ondansetron **OR** ondansetron (ZOFRAN) IV

## 2021-12-16 DIAGNOSIS — K920 Hematemesis: Secondary | ICD-10-CM

## 2021-12-16 DIAGNOSIS — R509 Fever, unspecified: Secondary | ICD-10-CM | POA: Diagnosis not present

## 2021-12-16 DIAGNOSIS — N179 Acute kidney failure, unspecified: Secondary | ICD-10-CM | POA: Diagnosis not present

## 2021-12-16 LAB — GLUCOSE, CAPILLARY
Glucose-Capillary: 109 mg/dL — ABNORMAL HIGH (ref 70–99)
Glucose-Capillary: 136 mg/dL — ABNORMAL HIGH (ref 70–99)
Glucose-Capillary: 133 mg/dL — ABNORMAL HIGH (ref 70–99)

## 2021-12-16 LAB — CULTURE, BLOOD (ROUTINE X 2)
Culture: NO GROWTH
Culture: NO GROWTH
Special Requests: ADEQUATE
Special Requests: ADEQUATE

## 2021-12-16 LAB — BASIC METABOLIC PANEL
Anion gap: 4 — ABNORMAL LOW (ref 5–15)
BUN: 19 mg/dL (ref 8–23)
CO2: 16 mmol/L — ABNORMAL LOW (ref 22–32)
Calcium: 8.7 mg/dL — ABNORMAL LOW (ref 8.9–10.3)
Chloride: 118 mmol/L — ABNORMAL HIGH (ref 98–111)
Creatinine, Ser: 2.17 mg/dL — ABNORMAL HIGH (ref 0.44–1.00)
GFR, Estimated: 23 mL/min — ABNORMAL LOW (ref >60)
Glucose, Bld: 150 mg/dL — ABNORMAL HIGH (ref 70–99)
Potassium: 4.2 mmol/L (ref 3.5–5.1)
Sodium: 138 mmol/L (ref 135–145)

## 2021-12-16 LAB — CBC
HCT: 26.6 % — ABNORMAL LOW (ref 36.0–46.0)
Hemoglobin: 8.8 g/dL — ABNORMAL LOW (ref 12.0–15.0)
MCH: 29.6 pg (ref 26.0–34.0)
MCHC: 33.1 g/dL (ref 30.0–36.0)
MCV: 89.6 fL (ref 80.0–100.0)
Platelets: 331 10*3/uL (ref 150–400)
RBC: 2.97 MIL/uL — ABNORMAL LOW (ref 3.87–5.11)
RDW: 16.9 % — ABNORMAL HIGH (ref 11.5–15.5)
WBC: 12 10*3/uL — ABNORMAL HIGH (ref 4.0–10.5)
nRBC: 0 % (ref 0.0–0.2)

## 2021-12-16 LAB — SURGICAL PATHOLOGY

## 2021-12-16 MED ORDER — SUCRALFATE 1 GM/10ML PO SUSP: 1.0000 g | Freq: Three times a day (TID) | ORAL | Status: DC

## 2021-12-16 MED ORDER — INSULIN ASPART 100 UNIT/ML IJ SOLN
0.0000 [IU] | Freq: Three times a day (TID) | INTRAMUSCULAR | Status: DC
  Administered 2021-12-16: 1 [IU] via SUBCUTANEOUS

## 2021-12-16 MED ORDER — PANTOPRAZOLE SODIUM 40 MG PO TBEC: 40.0000 mg | DELAYED_RELEASE_TABLET | Freq: Two times a day (BID) | ORAL | Status: AC

## 2021-12-16 MED ORDER — SODIUM BICARBONATE 650 MG PO TABS: 650.0000 mg | ORAL_TABLET | Freq: Two times a day (BID) | ORAL | Status: AC

## 2021-12-16 MED ORDER — DICLOFENAC SODIUM 1 % EX GEL: 2.0000 g | Freq: Three times a day (TID) | CUTANEOUS | Status: AC

## 2021-12-16 MED ORDER — LEVETIRACETAM 500 MG PO TABS: 500.0000 mg | ORAL_TABLET | Freq: Two times a day (BID) | ORAL | Status: AC

## 2021-12-16 MED ORDER — INSULIN ASPART 100 UNIT/ML IJ SOLN: 0.0000 [IU] | Freq: Three times a day (TID) | INTRAMUSCULAR | Status: AC

## 2021-12-16 NOTE — Progress Notes (Signed)
OT Cancellation Note  Patient Details Name: Maygan Koeller MRN: [id number] DOB: June 29, 1940   Cancelled Treatment:    Reason Eval/Treat Not Completed: Other (comment) (Patient declined asking for therapy to come back to work with her after she has eaten breakfast.)  Leota Sauers 12/16/2021, 9:31 AM

## 2021-12-16 NOTE — Progress Notes (Signed)
Wheelchair to lobby for discharge.

## 2021-12-16 NOTE — Progress Notes (Addendum)
[facility] Progress Note  Subjective: creat stable at 2.1, pt w/o c/o's.   Vitals:   12/15/21 1408 12/15/21 1659 12/15/21 2021 12/16/21 0414  BP: (!) 154/61 (!) 198/70 (!) 170/57 (!) 138/55  Pulse: (!) 59 (!) 59 60 (!) 59  Resp: _0 Temp: (!) 97.5 F (36.4 C)  98.1 F (36.7 C) 98.2 F (36.8 C)  TempSrc: Oral  Oral   SpO2: 100% 99% 99% 100%  Weight:      Height:        Exam: Gen alert, no distress, elderly AAF No jvd or bruits Chest clear bilat to bases RRR no MRG Abd soft ntnd no mass or ascites +bs Ext no LE edema Neuro is alert, nonfocal, pleasant     Home meds include - atorvastatin, hydralazine 50 tid, isosorbide mononitrate 30 qd, lacosamide, levetiracetam, zoloft, prns/ vits/ supps      Date                          Creat                           eGFR    2010                         1.03 October 2009                  3.0 >> 1.1                    AKI episode    2015                         1.5 >> 0.9                    AKI episode    Feb 2016                  3.0 >> 1.0                    AKI episode    Mar-April 2016         1.9 >> 0.8                    AKI episode    April 2016                 2.49 > 1.98                  AKI episode    June 2016                1.35 >> 0.94                AKI episode    Oct 2018                  1.40    Dec 2020                  1.42    Mar 2021                  1.06- 1.37  43- 59 ml/min    Aug 2022  1.14                            49 ml/min, stage IIIa    12/11/21                     2.38    9/17                          2.44    9/18                          2.44    12/14/21                     2.59         9/20   2.17   23    9/21    2.14   23        UA 9/16 - mod Hb, prot 30, 21-50 rbc, >50 wbc, 0-5 epi, rare bact, large LE     UCx and BCx's here negative    Renal US 9/19 here >  10-12 cm kidneys, ^'d echo, no hydro   Assessment/ Plan: AKI on CKD 3a - b/l creatinine 1.1- 1.3 from  2021- 2022, eGFR 43- 2m/min. Hx of multiple episodes of AKI in the past (7-12 yrs ago), probably related to Chron's flares, but not in more recent years. CT/ renal [place] at OSH showed no obstruction reportedly. UA here looked infected and [place] showed no obstruction.  Pt was not eating well for "weeks". On exam looked vol depleted. AKI suspected due to vol  depletion, other possibility of AIN d/t course of IV abx is less likely. We gave 2.5 L bolus and ^'d IVF"s to 100 cc/hr and creat improved down to 2.1 and today is stable at 2.1. Will dc IVF's. This may be her new baseline VS AKI not yet recovered (e.g., ATN or AIN) vs acute pyelo w/ loss of GFR. She is not on any nephrotoxic medications and UOP is good w/ stable creatinine, ok for dc. Will set [place] up [place]/ [place] I have d/w the granddaughter on the phone and questions answered. Will sign off.  Ecoli urosepsis - sp course of IV zosyn mostly given at OSH HTN - cont hydralazine and imdur, Bp's good H/o CVA  Seizure d/o SP PPM Anemia chronic disease - Hb 8-9s    Rob Luv Mish 12/16/2021, 11:31 AM   Recent Labs  Lab 12/11/21 0044 12/12/21 0539 12/13/21 0759 12/15/21 0524 12/16/21 0527  HGB 8.8* 8.5*   < > 9.4* 8.8*  ALBUMIN 2.0* 2.0*  --   --   --   CALCIUM 9.2 9.0   < > 8.8* 8.7*  PHOS 3.8 3.3  --   --   --   CREATININE 2.38* 2.44*   < > 2.14* 2.17*  K 4.3 3.9   < > 3.9 4.2   < > = values in this interval not displayed.    Recent Labs  Lab 12/12/21 0944  IRON 40  TIBC 149*  FERRITIN 658*    Inpatient medications:  atorvastatin  40 mg Oral Daily   diclofenac Sodium  4 g Topical TID   fluticasone  1 spray Each Nare Daily   heparin  5,000 Units Subcutaneous Q8H   hydrALAZINE  50 mg Oral TID  insulin aspart  0-15 Units Subcutaneous TID WC   insulin aspart  0-5 Units Subcutaneous QHS   isosorbide mononitrate  30 mg Oral q AM   lacosamide  225 mg Oral q morning   lacosamide  300 mg Oral QPM   levETIRAcetam   750 mg Oral BID   pantoprazole  40 mg Oral BID AC   sertraline  25 mg Oral Daily   sodium bicarbonate  650 mg Oral BID   sucralfate  1 g Oral TID WC & HS    sodium chloride Stopped (12/16/21 0007)   acetaminophen **OR** acetaminophen, ondansetron **OR** ondansetron (ZOFRAN) IV

## 2021-12-16 NOTE — Discharge Summary (Signed)
Physician Discharge Summary  Neta Upadhyay ZHY:[id number] DOB: 1941/01/26  PCP: Doretha Sou, MD  Admitted from: Transferred from [facility] to [facility]. Discharged to: SNF for short-term rehab.  Admit date: 12/10/2021 Discharge date: 12/16/2021  Recommendations for Outpatient Follow-up:    Contact information for follow-up providers     MD at SNF Follow up.   Why: To be seen in 2 to 3 days with repeat labs (CBC, CMP, magnesium and phosphorus).        Doretha Sou, MD. Schedule an appointment as soon as possible for a visit.   Specialty: Internal Medicine Why: To be seen upon discharge from SNF.  Kindly ensure that patient follows up with her outpatient Neurologist and Gastroenterologist. Contact information: [place] [address] [phone]         Mansouraty, Telford Nab., MD. Schedule an appointment as soon as possible for a visit.   Specialties: Gastroenterology, Internal Medicine Why: If patient does not have a primary gastroenterologist, kindly call Dr. Donneta Romberg office for an appointment.  Will need EGD in 4 weeks to ensure healing of esophagitis/gastritis. Contact information: [place] [address] [phone]         [facility], [facility] Follow up.   Why: Office will arrange follow-up in approximately 2 weeks.  Please contact them if you do not hear back from them. Contact information: [place] [address] [phone]              Contact information for after-discharge care     Destination     [facility] Preferred SNF .   Service: Skilled Nursing Contact information: [place]: None.    Equipment/Devices: TBD at SNF.    Discharge Condition: Improved and stable.   Code Status: Full Code Diet recommendation:   Discharge Diet Orders (From admission, onward)     Start     Ordered   12/16/21 0000  Diet - low sodium heart healthy        12/16/21 1335   12/16/21 0000  Diet Carb Modified        12/16/21 1335             Discharge Diagnoses:  Principal Problem:   Persistent fever Active Problems:   AKI (acute kidney injury) ([place])   Seizure disorder as sequela of cerebrovascular accident ([place])   Diabetes mellitus ([place])   Hematemesis with nausea   Nausea and vomiting   Normocytic anemia   History of Crohn's disease   Brief Summary: 81 year old with history of seizure disorder on Keppra and Vimpat, stroke, ambulatory dysfunction, hypertension, sinus bradycardia status post pacemaker, hyperlipidemia admitted to [facility] on 9/5 with confusion due to sepsis from pansensitive E. coli UTI & bacteremia. Initially treated with Zosyn then converted to Rocephin.  Despite this she had persistent fevers, antibiotics changed back to Zosyn on 9/13 with no recurrence of fevers.  She also had AKI with creatinine initially 1.9 (1.2-1.3 baseline), initially improved with IV fluids and then worsened again to 2.3 on 9/16.  WBC which was up to 35 K on 9/13 had been coming down.  CT A/P on 9/12: No acute findings.  Renal US 9/13: No obstruction.  Flu, RVP, COVID negative.  Patient was transferred to [facility] for persistent fevers  and higher level of care.     Assessment & Plan:   Pansensitive E. coli UTI and bacteremia: Per H&P, had sepsis at OSH but without sepsis features on this hospital admission.  Had 10-day stay at [facility], flu, RVP negative.  CT A/P negative on 9/12.  Transferred from OSH to [facility] due to persistent fevers.  There was some discussion about ID consultation in H&P but appears not consulted.  Received a dose of IV Zosyn on 9/15 and has been off antibiotics since then.  Has had no fevers since hospital admission.  Leukocytosis has improved from 24.2 on admission to 12 on 9/21.  Blood cultures x2 from 9/16: Neg.  Urine culture 9/15: Negative.?  Fevers due to antibiotics.  Improved.   Acute kidney injury complicating CKD stage IIIa: Nephrology consultation and follow-up appreciated.  As per them, baseline creatinine 1.14 from August 2022, EGFR of 49 mL/min.  CT/renal ultrasound done at OSH without obstruction.  Repeat renal ultrasound here on 9/19 without obstructive uropathy.  Patient not eating well for weeks and suspecting volume depletion on admission.  Although got cephalosporins at OSH, AIN felt less likely.  Bolused with 2.5 L IVF on 9/19 followed by maintenance IVF at 100 mL/h, has excellent urine output and creatinine down from 2.59-2.14.  Avoid nephrotoxins.  Trend daily BMP at SNF.  Creatinine has plateaued in the 2.1 range.  Reviewed with Dr. Jonnie Finner, Nephrology and his input appreciated.  Recommends discontinuing IV fluids and indicates that this creatinine may be her new baseline versus AKI not yet recovered versus acute pyelo with loss of GFR.  Urine output is good and he has cleared her for discharge and will set up follow-up visit at [facility].  Continue empirically started sodium bicarbonate for non-anion gap metabolic acidosis.   Non anion gap metabolic acidosis: Remains on oral bicarbonate   Seizure disorder: No seizures noted.  AEDs were adjusted to renal insufficiency on admission after admitting MD discussed with neurology.  Will need to adjust them back based on improvement of renal functions.  As discussed extensively with pharmacy on day of discharge, based on current renal functions (creatinine clearance 19.1 GFR 23), reduced Keppra to appropriate dose of 500 mg twice daily (was on 1500 Mg twice daily PTA) to avoid side effects such as sedation/drowsiness etc. and continue current Vimpat dose at Vimpat 225 Mg in the morning and 300 Mg in the evening.  Recommend outpatient follow-up with her primary neurologist.  AED doses can be further  adjusted based on her renal function monitoring.     History of CVA: Reportedly not on antiplatelets PTA.  Defer management to her PCP or neurologist.   Type II DM: A1c 7.4 suggesting suboptimal outpatient control.  Reportedly not on treatment at home.  NovoLog SSI.   Essential hypertension: Reasonably controlled on hydralazine and Imdur.   Hyperlipidemia: Continue statins.   S/p PPM: Atrial paced rhythm in the 60s on monitor.  Outpatient follow-up with primary cardiologist.   LA grade D erosive esophagitis distally, gastritis:  GI consultation and follow-up appreciated.  S/p EGD 9/19 with findings as noted.  Recommending p.o. PPI 40 mg twice daily and Carafate slurry before every meal if can tolerate but at least twice daily-given AKI, will need to monitor closely.  GI will follow pathology results and they recommend repeating EGD in 4 months to check healing which can be done by her primary GI at [facility].  As per discussion with Dr.  McArthur, nephrology, recommends that it is okay to continue the sucralfate for short period of time i.e. up to a month.  Also as per discussion with patient and granddaughter, patient does not have an established gastroenterologist and advised her to follow-up with [facility].   Acute on chronic anemia: Presented with hemoglobin of 8.8 which dropped to a low of 7 g per DL on 9/18.  Improved posttransfusion and stable at 9.4.  Slight drop in hemoglobin from 9.4-8.8, in the absence of overt bleeding, could be dilutional.  As per earlier progress notes, hemoglobin dropped from 13-7 over a span of 2 weeks.  S/p 1 unit PRBC.  EGD results as above.  Iron, B12 and folate levels normal.  Follow CBC closely at SNF and transfuse as needed.   Neck and bilateral shoulder pain: Voltaren gel as she was using PTA with good effect.  Microscopic hematuria: Could have been related to the UTI.  Recommend repeating urine microscopy in a couple of weeks and if this persists then may  need further evaluation including a urology consultation.   Body mass index is 20.46 kg/m.     Consultants:   [facility]   Procedures:   EGD 9/19 with summary results as noted above.   Impression:  - No gross lesions in esophagus proximally. - LA Grade D erosive esophagitis with no bleeding distally. Biopsied. - Z-line irregular, 30 cm from the incisors. - 9 cm hiatal hernia. - Erythematous mucosa in the stomach. Biopsied. - Mucosal changes in the duodenum bulb. Biopsied. - No gross lesions in the second portion of the duodenum.  Recommendations:  - Recommend PO PPI 40 mg twice daily. - Start Carafate BID at minimum but if can tolerate QAC, then I think this will be most helpful for her (and the liquid format will be important). - Observe patient's clinical course. - Await pathology results. - Recommend considering repeat upper endoscopy in 4 months to check healing (which could be done by primary GI at [facility]).  Pathology:  FINAL MICROSCOPIC DIAGNOSIS:   A. GASTRIC BIOPSY:  -  Antral mucosa with chemical/reactive gastropathy.  -  Oxyntic mucosa with reactive/reparative change.  -  No Helicobacter pylori organisms identified on HE stained slide.   B. DUODENAL BULB BIOPSY:  -  Duodenal mucosa with prominent Brunner's glands, otherwise no  significant pathology.   C. ESOPHAGEAL BIOPSY:  -  Squamous mucosa with focal erosion and reactive/reparative change.  -  Negative for an increase in intraepithelial eosinophils.  -  No morphologic evidence of viral cytopathic effect.  Discharge Instructions  Discharge Instructions     Ambulatory referral to Nephrology   Complete by: As directed    Call MD for:   Complete by: As directed    Vomiting blood or coffee-ground colored material, blood in stools or black tarry stools.   Call MD for:  difficulty breathing, headache or visual disturbances   Complete by: As directed    Call MD for:  extreme fatigue    Complete by: As directed    Call MD for:  persistant dizziness or light-headedness   Complete by: As directed    Call MD for:  persistant nausea and vomiting   Complete by: As directed    Call MD for:  severe uncontrolled pain   Complete by: As directed    Call MD for:  temperature >100.4   Complete by: As directed    Diet - low sodium heart healthy   Complete  by: As directed    Diet Carb Modified   Complete by: As directed    Increase activity slowly   Complete by: As directed         Medication List     TAKE these medications    acetaminophen 325 MG tablet Commonly known as: TYLENOL Take 325 mg by mouth every 6 (six) hours as needed for mild pain or headache.   atorvastatin 40 MG tablet Commonly known as: LIPITOR Take 40 mg by mouth at bedtime.   diclofenac Sodium 1 % Gel Commonly known as: Voltaren Apply 2 g topically 3 (three) times daily. Apply to neck and both shoulders What changed:  when to take this additional instructions   fluticasone 50 MCG/ACT nasal spray Commonly known as: FLONASE Place 1 spray into both nostrils daily.   hydrALAZINE 25 MG tablet Commonly known as: APRESOLINE Take 50 mg by mouth 3 (three) times daily.   insulin aspart 100 UNIT/ML injection Commonly known as: novoLOG Inject 0-9 Units into the skin 3 (three) times daily with meals. CBG < 70: Implement Hypoglycemia protocol and recheck CBG.  CBG 70 - 120: 0 units CBG 121 - 150: 1 unit CBG 151 - 200: 2 units CBG 201 - 250: 3 units CBG 251 - 300: 5 units CBG 301 - 350: 7 units CBG 351 - 400: 9 units CBG > 400: call MD.   isosorbide mononitrate 30 MG 24 hr tablet Commonly known as: IMDUR Take 30 mg by mouth in the morning.   Lacosamide 150 MG Tabs Take 2 tablets (300 mg total) by mouth at bedtime.   lacosamide 50 MG Tabs tablet Commonly known as: VIMPAT Take 4.5 tablets (225 mg total) by mouth in the morning.   levETIRAcetam 500 MG tablet Commonly known as: KEPPRA Take 1 tablet  (500 mg total) by mouth 2 (two) times daily. What changed:  medication strength how much to take when to take this   pantoprazole 40 MG tablet Commonly known as: PROTONIX Take 1 tablet (40 mg total) by mouth 2 (two) times daily before a meal.   sodium bicarbonate 650 MG tablet Take 1 tablet (650 mg total) by mouth 2 (two) times daily.   sucralfate 1 GM/10ML suspension Commonly known as: CARAFATE Take 10 mLs (1 g total) by mouth 4 (four) times daily -  with meals and at bedtime for 14 days.   Zoloft 25 MG tablet Generic drug: sertraline Take 25 mg by mouth daily.       Allergies  Allergen Reactions   Tape Other (See Comments)    NO PLASTIC TAPE- Bubbles the skin!!   Tramadol Other (See Comments)    Seizures    Latex Rash      Procedures/Studies: US RENAL  Result Date: 12/14/2021 CLINICAL DATA:  Acute renal failure superimposed on stage 3 chronic kidney disease. EXAM: RENAL / URINARY TRACT ULTRASOUND COMPLETE COMPARISON:  Renal ultrasound 6 days ago 12/08/2021 [facility], noncontrast CT 12/07/2021. FINDINGS: Right Kidney: Renal measurements: 10.1 x 5.1 x 5.6 cm = volume: 152 mL. No hydronephrosis. Diffusely increased renal parenchymal echogenicity. Simple cyst measures 2.3 cm from the mid lower pole. No specific imaging follow-up is recommended. Additional cyst on prior ultrasound not well seen on the current exam. No visible renal calculi or solid lesion. Left Kidney: Renal measurements: 11.9 x 7 x 6.8 cm = volume: 294 mL. Increased renal parenchymal echogenicity. No hydronephrosis. Simple cyst arising from the mid kidney measures 2.9 cm. Additional 1.4 cm  cyst in the lower pole. No specific imaging follow-up is recommended. No renal calculi or solid lesion. Bladder: Appears normal for degree of bladder distention. Previous bladder debris not seen on the current exam Other: None. IMPRESSION: 1. Increased renal parenchymal echogenicity consistent with chronic medical renal  disease. 2. No obstructive uropathy. Electronically Signed   By: Keith Rake M.D.   On: 12/14/2021 20:12      Subjective: Seen this morning.  No family at bedside.  Reports that she was feeling okay.  Denied complaints.  Specifically denied dyspnea, chest pain.  Indicates some neck pain but per RN, significantly better with Voltaren gel.  Denies upper extremity weakness, numbness or tingling.  Discharge Exam:  Vitals:   12/15/21 1408 12/15/21 1659 12/15/21 2021 12/16/21 0414  BP: (!) 154/61 (!) 198/70 (!) 170/57 (!) 138/55  Pulse: (!) 59 (!) 59 60 (!) 59  Resp: 18  19 20   Temp: (!) 97.5 F (36.4 C)  98.1 F (36.7 C) 98.2 F (36.8 C)  TempSrc: Oral  Oral   SpO2: 100% 99% 99% 100%  Weight:      Height:        General exam: Elderly female, moderately built and nourished, lying comfortably propped up in bed without distress.  Looks much improved compared to yesterday when she was sleeping.  Oral mucosa moist. Respiratory system: Slight diminished breath sounds in the bases but otherwise clear to auscultation without wheezing, rhonchi or crackles.  No increased work of breathing. Cardiovascular system: S1 & S2 heard, RRR. No JVD, murmurs, rubs, gallops or clicks.  No pedal edema.   Gastrointestinal system: Abdomen is nondistended, soft and nontender. No organomegaly or masses felt. Normal bowel sounds heard. Central nervous system: Alert and oriented x2.  No focal neurological deficits. Extremities: Symmetric 5 x 5 power. Skin: No rashes, lesions or ulcers Psychiatry: Judgement and insight appear somewhat impaired. Mood & affect appropriate.     The results of significant diagnostics from this hospitalization (including imaging, microbiology, ancillary and laboratory) are listed below for reference.     Microbiology: Recent Results (from the past 240 hour(s))  Culture, blood (Routine X 2) w Reflex to ID Panel     Status: None   Collection Time: 12/11/21 12:44 AM   Specimen:  Right Antecubital; Blood  Result Value Ref Range Status   Specimen Description   Final    RIGHT ANTECUBITAL BLOOD Performed at [facility], [address], [place], [place] [address]    Special Requests   Final    BOTTLES DRAWN AEROBIC AND ANAEROBIC Blood Culture adequate volume Performed at [facility] [address], [place], [place] [address]    Culture   Final    NO GROWTH 5 DAYS Performed at [facility], [place] [address], [place], [place] [address]    Report Status 12/16/2021 FINAL  Final  Culture, blood (Routine X 2) w Reflex to ID Panel     Status: None   Collection Time: 12/11/21 12:48 AM   Specimen: BLOOD RIGHT HAND  Result Value Ref Range Status   Specimen Description   Final    BLOOD RIGHT HAND Performed at [facility] [address], [place], [place] [address]    Special Requests   Final    BOTTLES DRAWN AEROBIC ONLY Blood Culture adequate volume Performed at [facility] [address] [place], [place] [address]    Culture   Final  NO GROWTH 5 DAYS Performed at [facility], [place] [address], [place], [place] [address]    Report Status 12/16/2021 FINAL  Final  Urine Culture     Status: None   Collection Time: 12/11/21  5:22 AM   Specimen: Urine, Clean Catch  Result Value Ref Range Status   Specimen Description   Final    URINE, CLEAN CATCH Performed at [facility], [place] [address], [place], [place] [address]    Special Requests   Final    NONE Performed at [facility], [place] [address] [place], [place] [address]    Culture   Final    NO GROWTH Performed at [facility], [place] [address], [place], [place] [address]    Report Status 12/12/2021 FINAL  Final     Labs: CBC: Recent Labs  Lab 12/11/21 0044 12/12/21 0539 12/13/21 0759 12/14/21 0532 12/15/21 0524 12/16/21 0527  WBC 24.2* 17.3* 11.7* 13.6* 12.9* 12.0*  NEUTROABS  20.5* 13.5* 9.0* 10.4* 9.8*  --   HGB 8.8* 8.5* 7.0* 9.4* 9.4* 8.8*  HCT 25.5* 25.3* 21.0* 28.0* 27.9* 26.6*  MCV 87.6 89.7 89.7 88.1 88.9 89.6  PLT 365 389 358 386 338 762    Basic Metabolic Panel: Recent Labs  Lab 12/11/21 0044 12/12/21 0539 12/13/21 0759 12/14/21 0532 12/15/21 0524 12/16/21 0527  NA 136 134* 140 140 139 138  K 4.3 3.9 3.9 4.1 3.9 4.2  CL 112* 111 120* 118* 117* 118*  CO2 17* 16* 14* 16* 17* 16*  GLUCOSE 145* 166* 120* 126* 119* 150*  BUN 33* 29* 23 24* 18 19  CREATININE 2.38* 2.44* 2.44* 2.59* 2.14* 2.17*  CALCIUM 9.2 9.0 8.2* 9.3 8.8* 8.7*  MG 2.0 1.9  --  1.9  --   --   PHOS 3.8 3.3  --   --   --   --     Liver Function Tests: Recent Labs  Lab 12/11/21 0044 12/12/21 0539  AST 46* 34  ALT 34 34  ALKPHOS 74 66  BILITOT 0.7 0.4  PROT 6.5 6.3*  ALBUMIN 2.0* 2.0*    CBG: Recent Labs  Lab 12/15/21 1144 12/15/21 1649 12/15/21 2017 12/16/21 0744 12/16/21 1142  GLUCAP 162* 74 87 109* 136*    Urinalysis    Component Value Date/Time   COLORURINE YELLOW 12/11/2021 0522   APPEARANCEUR CLOUDY (A) 12/11/2021 0522   LABSPEC 1.011 12/11/2021 0522   PHURINE 5.0 12/11/2021 0522   GLUCOSEU NEGATIVE 12/11/2021 0522   HGBUR MODERATE (A) 12/11/2021 0522   BILIRUBINUR NEGATIVE 12/11/2021 0522   KETONESUR NEGATIVE 12/11/2021 0522   PROTEINUR 30 (A) 12/11/2021 0522   UROBILINOGEN 0.2 09/12/2014 0915   NITRITE NEGATIVE 12/11/2021 0522   LEUKOCYTESUR LARGE (A) 12/11/2021 0522    Discussed in detail with patient's granddaughter who is a NP, updated care and answered all questions.  Time coordinating discharge: 45 minutes  SIGNED:  Vernell Leep, MD,  Vernell Leep, MD,  FACP, [facility], [facility], [facility], [place]   To contact the attending provider between 7A-7P or the covering provider during after hours 7P-7A, please log into the web site [email or web address] and access using universal [place]  password for that web site. If you do not have the password, please call the hospital operator.

## 2021-12-16 NOTE — Discharge Instructions (Signed)

## 2021-12-16 NOTE — Progress Notes (Signed)
Occupational Therapy Treatment Patient Details Name: Michelle Farrell MRN: [id number] DOB: [birth year] Today's Date: 12/16/2021   History of present illness Patienet is a 81 year old female who was admitted to the hospital from ALF with E coli bacteremia UTI, persistent fever, and AKI. PMH: seizure disorder, ambulatory dysfunction, HTN, sinus bradycardia s/p pacemaker, hyperlipidemia, h/o CVA, DM II   OT comments  Pt required slightly decreased assistance for functional transfers today and she is demonstrating gradual functional progress. She performed a stand-step transfer to the bedside chair using a RW. She reported having slight upper back pain. She will continue to benefit from OT services to facilitate improved strengthening and to maximize her ADL performance.    Recommendations for follow up therapy are one component of a multi-disciplinary discharge planning process, led by the attending physician.  Recommendations may be updated based on patient status, additional functional criteria and insurance authorization.    Follow Up Recommendations  Skilled nursing-short term rehab (<3 hours/day)       Patient can return home with the following  A lot of help with bathing/dressing/bathroom;A little help with walking and/or transfers;Assistance with cooking/housework         Precautions / Restrictions Precautions Precautions: Fall Restrictions Weight Bearing Restrictions: No       Mobility Bed Mobility Overal bed mobility: Needs Assistance Bed Mobility: Supine to Sit     Supine to sit: Min guard Sit to supine: HOB elevated        Transfers Overall transfer level: Needs assistance Equipment used: Rolling walker (2 wheels), None   Sit to Stand: Min assist           General transfer comment: bed height elevated         ADL either performed or assessed with clinical judgement      Arousal/Alertness: Awake/alert Behavior During Therapy: [facility] for tasks  assessed/performed                        Pertinent Vitals/ Pain       Pain Assessment Pain Assessment: No/denies pain         Frequency  Min 2X/week        Progress Toward Goals  OT Goals(current goals can now be found in the care plan section)  Progress towards OT goals: Progressing toward goals  Acute Rehab OT Goals OT Goal Formulation: With patient Time For Goal Achievement: 12/26/21 Potential to Achieve Goals: Good  Plan Discharge plan remains appropriate       AM-PAC OT "6 Clicks" Daily Activity     Outcome Measure   Help from another person eating meals?: None Help from another person taking care of personal grooming?: A Little Help from another person toileting, which includes using toliet, bedpan, or urinal?: A Lot Help from another person bathing (including washing, rinsing, drying)?: A Lot Help from another person to put on and taking off regular upper body clothing?: A Little Help from another person to put on and taking off regular lower body clothing?: A Lot 6 Click Score: 16    End of Session Equipment Utilized During Treatment: Gait belt;Rolling walker (2 wheels)  OT Visit Diagnosis: Unsteadiness on feet (R26.81);Muscle weakness (generalized) (M62.81)   Activity Tolerance Patient tolerated treatment well   Patient Left in chair;with call bell/phone within reach;with chair alarm set   Nurse Communication Patient requests pain meds        Time: 7341-9379 OT Time Calculation (min): 18 min  Charges:  OT General Charges $OT Visit: 1 Visit OT Treatments $Therapeutic Activity: 8-22 mins   Leota Sauers, OTR/L 12/16/2021, 2:52 PM

## 2021-12-16 NOTE — Progress Notes (Signed)
[facility] called to give report at [phone] ext. [phone], spoke with Sharyn Lull, Therapist, sports. Report given patient being transported to facility by granddaughter. AVS printed and reviewed with facility and granddaughter, placed in packet for facility.

## 2021-12-16 NOTE — TOC Transition Note (Signed)
Transition of Care [facility]) - CM/SW Discharge Note   Patient Details  Name: Michelle Farrell MRN: [id number] Date of Birth: 11/30/1940  Transition [facility]) CM/SW Contact:  Vassie Moselle, LCSW Phone Number: 12/16/2021, 2:13 PM   Clinical Narrative:    Pt is to transfer to [place] for SNF. Pt will be going to room 211. RN to call report to [phone] ext [phone]. CSW spoke with pt's granddaughter who is agreeable to this plan. Pt will be transported to facility via [facility]. [facility] called at [phone] and ride is number 20 on the list.    Final next level of care: [place] Barriers to Discharge: Barriers Resolved   Patient Goals and CMS Choice Patient states their goals for this hospitalization and ongoing recovery are:: To go home   Choice offered to / list presented to : Patient (Granddaughter)  Discharge Placement PASRR number recieved: 12/12/21 Existing PASRR number confirmed : 12/13/21          Patient chooses bed at: [place], [place] Patient to be transferred to facility by: [place] Name of family member notified: Andrey Spearman Patient and family notified of of transfer: 12/16/21  Discharge Plan and Services In-house Referral: NA Discharge Planning Services: CM Consult Post Acute Care Choice: [place]          DME Arranged: N/A DME Agency: NA                  Social Determinants of Health ([place]) Interventions     Readmission Risk Interventions    12/16/2021    1:56 PM 12/13/2021   11:08 AM  Readmission Risk Prevention Plan  Post Dischage Appt Complete Complete  Medication Screening Complete Complete  Transportation Screening Complete Complete

## 2021-12-16 NOTE — Care Management Important Message (Signed)
Important Message  Patient Details IM Letter placed in Patients room. Name: Michelle Farrell MRN: [id number] Date of Birth: May 15, 1940   Medicare Important Message Given:  Yes     Kerin Salen 12/16/2021, 9:29 AM

## 2021-12-17 ENCOUNTER — Encounter: Payer: Self-pay | Admitting: Gastroenterology

## 2022-01-25 ENCOUNTER — Other Ambulatory Visit: Payer: Self-pay | Admitting: Nephrology

## 2022-01-25 ENCOUNTER — Other Ambulatory Visit (HOSPITAL_COMMUNITY): Payer: Self-pay | Admitting: Nephrology

## 2022-01-25 DIAGNOSIS — D369 Benign neoplasm, unspecified site: Secondary | ICD-10-CM

## 2022-02-24 ENCOUNTER — Encounter: Payer: Self-pay | Admitting: Nurse Practitioner

## 2022-02-24 ENCOUNTER — Ambulatory Visit (INDEPENDENT_AMBULATORY_CARE_PROVIDER_SITE_OTHER): Payer: Medicare Other | Admitting: Nurse Practitioner

## 2022-02-24 ENCOUNTER — Other Ambulatory Visit (INDEPENDENT_AMBULATORY_CARE_PROVIDER_SITE_OTHER): Payer: Medicare Other

## 2022-02-24 VITALS — BP 140/68 | HR 62 | Ht 66.0 in | Wt 117.0 lb

## 2022-02-24 DIAGNOSIS — D62 Acute posthemorrhagic anemia: Secondary | ICD-10-CM

## 2022-02-24 DIAGNOSIS — K209 Esophagitis, unspecified without bleeding: Secondary | ICD-10-CM

## 2022-02-24 DIAGNOSIS — K449 Diaphragmatic hernia without obstruction or gangrene: Secondary | ICD-10-CM

## 2022-02-24 LAB — CBC
HCT: 31.2 % — ABNORMAL LOW (ref 36.0–46.0)
Hemoglobin: 10.3 g/dL — ABNORMAL LOW (ref 12.0–15.0)
MCHC: 32.9 g/dL (ref 30.0–36.0)
MCV: 87.7 fl (ref 78.0–100.0)
Platelets: 256 10*3/uL (ref 150.0–400.0)
RBC: 3.56 Mil/uL — ABNORMAL LOW (ref 3.87–5.11)
RDW: 16.3 % — ABNORMAL HIGH (ref 11.5–15.5)
WBC: 9.8 10*3/uL (ref 4.0–10.5)

## 2022-02-24 NOTE — Progress Notes (Signed)
Chief Complaint: hospital follow   Assessment    Patient profile:  Michelle Farrell is a 81 y.o. female known to Dr. Rush Landmark with a past medical history of Crohn's disease (remote), seizure disorder, DM, CKD stage 2, CVA on plavix, pacemaker, HTN, HLD, TAH, cholecystectomy.  See PMH /PSH for additional history  # 81 yo female hospitalized in September with N/V/ hematemesis and anemia.   Grade D esophagitis on EGD,  along with a 9 cm hiatal hernia without Cameron's erosions.   Still taking Carafate and BID PPI She endorses frequent heartburn despite BID PPI.  Presumably esophagitis has healed with medications and resolution of vomiting.   # Dickey anemia. Hard to know how much of the decline in hgb was from a GI bleed vrs anemia related to CKD.  Iron studies do suggest iron deficiency Hgb in Aug 2022 was normal, it was down to 7 in the hospital but renal function was much worse compared to Aug 2022. Required a u PRBCs  # Reported history of Crohn's disease. Records not available.  No flares in greater than 10 years off treatment. Poor prep but no active disease on last colonoscopy in 2015  # Occasional, self- limited rectal bleeding with bowel movements when constipated.   Plan:    **Below plan will be printed under patient instructions on AVS  Discontinue Carafate Continue twice daily pantoprazole.  Labs today: CBC Ant-reflux measures discussed and will be given to nursing facility  ( See AVS) where she resides  Spoke with Granddaughter over the telephone. She prefers to hold off on a colonoscopy for now unless absolutely necessary and then she will discuss with patient.  Add daily Benefiber Follow up with Dr. Rush Landmark in ~ 2 months Will discuss with Dr .Rush Landmark but seems reasonable to hold off on repeat EGD at this point.  Nursing facility or family should contact us for any recurrent hematemesis ( vomiting of blood)   HPI    [name] was seen by [place] in the hospital  September 2023.  She had transferred from [facility] where she was being treated for AKI confusion, UTI sepsis.   We saw her in consultation for N/V and an episode of hematemesis prior to transfer. Her hemoglobin declined from baseline of 13.8 to 7.  She underwent inpatient EGD with abnormal findings of LA Grade D erosive esophagitis with no bleeding, a 9 cm hiatal hernia, erythematous mucosa in the stomach, mucosal changes in the duodenum bulb (see full report below)  She required a unit of blood with improvement in hgb to 8.8  Interval History : Here for follow up. Granddaughter on telephone and helps with history. Per Granddaughter patient was diagnosed with Crohn's 30 years ago. She was last seen by GI in 2015 ( [facility]). She hasn't required any medications for Crohn's disease in at least 10 years. Granddaughter would rather not put patient through another colonoscopy unless absolutely necessary.    Michelle Farrell continues to have nausea with occasional vomiting. She thinks the nausea is related to the food at facility where she lives. Doesn't get nauseated after eating food prepared by her family. She is taking Carafate before meals and BID Pantoprazole. Daughter feels like her weight has stabilized.   [name] sometimes has a small amount of blood in stool when constipated. Sometimes she is constipated, other times stools may be runny depending on what she eats.    Previous GI Evaluation   Most recent colonoscopy was Sept 2015 September 2015  colonoscopy for Crohn's surveillance.  -Complete exam.  Poor preparation. -- 10 cm of the terminal ileum was intubated and normal. --Less than 50% of the colonic mucosa was seen but appeared healthy with normal vascular pattern -- Surveillance biopsies done 3 mm rectosigmoid polyp was removed. Five other 102 mm polyps were removed in the rectosigmoid colon  DIAGNOSIS:    A. CECAL/ASCENDING COLON BIOPSIES:     BENIGN COLONIC MUCOSA WITH NO  SIGNIFICANT INFLAMMATION.        NO EVIDENCE OF ACTIVE CROHN'S OR DYSPLASIA.       B. TRANSVERSE COLON BIOPSIES:     BENIGN COLONIC MUCOSA WITH NO SIGNIFICANT INFLAMMATION.        NO EVIDENCE OF ACTIVE CROHN'S OR DYSPLASIA.       C. DESCENDING COLON BIOPSY:     BENIGN COLONIC MUCOSA WITH NO SIGNIFICANT INFLAMMATION.        NO EVIDENCE OF ACTIVE CROHN'S OR DYSPLASIA.       D. SIGMOID/RECTAL BIOPSY:     BENIGN COLONIC MUCOSA WITH NO SIGNIFICANT INFLAMMATION.        NO EVIDENCE OF ACTIVE CROHN'S OR DYSPLASIA.       E. RECTOSIGMOID POLYPS (x6):     BENIGN COLONIC MUCOSA WITH HYPERPLASTIC GLANDULAR     CHANGES CONSISTENT WITH MULTIPLE HYPERPLASTIC POLYPS.       April 2016 EGD  LA Class A esophagitis 2. Mild erosive gastritis in the gastric body 3. Mild nonerosive duodenitis 4. 5 cm hiatal hernia A. GASTRIC BIOPSY:  -  Antral mucosa with chemical/reactive gastropathy.  -  Oxyntic mucosa with reactive/reparative change.  -  No Helicobacter pylori organisms identified on HE stained slide.   B. DUODENAL BULB BIOPSY:  -  Duodenal mucosa with prominent Brunner's glands, otherwise no  significant pathology.   C. ESOPHAGEAL BIOPSY:  -  Squamous mucosa with focal erosion and reactive/reparative change.  -  Negative for an increase in intraepithelial eosinophils.  -  No morphologic evidence of viral cytopathic effect.   Sept 2023 EGD - No gross lesions in esophagus proximally. - LA Grade D erosive esophagitis with no bleeding distally. Biopsied. - Z-line irregular, 30 cm from the incisors. - 9 cm hiatal hernia. - Erythematous mucosa in the stomach. Biopsied. - Mucosal changes in the duodenum bulb. Biopsied. - No gross lesions in the second portion of the duodenum.  FINAL MICROSCOPIC DIAGNOSIS:   A. GASTRIC BIOPSY:  -  Antral mucosa with chemical/reactive gastropathy.  -  Oxyntic mucosa with reactive/reparative change.  -  No Helicobacter pylori organisms identified on HE  stained slide.   B. DUODENAL BULB BIOPSY:  -  Duodenal mucosa with prominent Brunner's glands, otherwise no  significant pathology.   C. ESOPHAGEAL BIOPSY:  -  Squamous mucosa with focal erosion and reactive/reparative change.  -  Negative for an increase in intraepithelial eosinophils.  -  No morphologic evidence of viral cytopathic effect.    Labs:     Latest Ref Rng & Units 12/16/2021    5:27 AM 12/15/2021    5:24 AM 12/14/2021    5:32 AM  CBC  WBC 4.0 - 10.5 K/uL 12.0  12.9  13.6   Hemoglobin 12.0 - 15.0 g/dL 8.8  9.4  9.4   Hematocrit 36.0 - 46.0 % 26.6  27.9  28.0   Platelets 150 - 400 K/uL 331  338  386        Latest Ref Rng & Units 12/12/2021    5:39 AM 12/11/2021  12:44 AM 11/21/2020    4:17 PM  Hepatic Function  Total Protein 6.5 - 8.1 g/dL 6.3  6.5  7.7   Albumin 3.5 - 5.0 g/dL 2.0  2.0  4.2   AST 15 - 41 U/L 34  46  27   ALT 0 - 44 U/L 34  34  18   Alk Phosphatase 38 - 126 U/L 66  74  110   Total Bilirubin 0.3 - 1.2 mg/dL 0.4  0.7  0.3    Ferritin 658, low TIBC  Past Medical History:  Diagnosis Date   AKI (acute kidney injury) ([place])    Arthritis    Back pain, chronic    CHF (congestive heart failure) (HCC)    CKD (chronic kidney disease)    COPD (chronic obstructive pulmonary disease) ([place])    Crohn disease ([place])    Diabetes mellitus without complication ([place])    Esophageal stricture    s/p dilation 2003, 2008   GERD (gastroesophageal reflux disease)    Hip pain, chronic    Hypertension    PUD (peptic ulcer disease)    Seizures ([place])    Sleep apnea    Stroke [facility])     Past Surgical History:  Procedure Laterality Date   ABDOMINAL HYSTERECTOMY     BIOPSY  12/14/2021   Procedure: BIOPSY;  Surgeon: Irving Copas., MD;  Location: [facility];  Service: Gastroenterology;;   BLADDER SURGERY     CHOLECYSTECTOMY     ESOPHAGOGASTRODUODENOSCOPY N/A 07/08/2014   Procedure: ESOPHAGOGASTRODUODENOSCOPY (EGD);  Surgeon: Ladene Artist, MD;   Location: [facility] ENDOSCOPY;  Service: Endoscopy;  Laterality: N/A;   ESOPHAGOGASTRODUODENOSCOPY (EGD) WITH PROPOFOL N/A 12/14/2021   Procedure: ESOPHAGOGASTRODUODENOSCOPY (EGD) WITH PROPOFOL;  Surgeon: Rush Landmark Telford Nab., MD;  Location: [facility] ENDOSCOPY;  Service: Gastroenterology;  Laterality: N/A;   SKIN CANCER EXCISION     TONSILLECTOMY      Current Medications, Allergies, Family History and Social History were reviewed in [employer] record.     Current Outpatient Medications  Medication Sig Dispense Refill   acetaminophen (TYLENOL) 325 MG tablet Take 325 mg by mouth every 6 (six) hours as needed for mild pain or headache.     atorvastatin (LIPITOR) 40 MG tablet Take 40 mg by mouth at bedtime.      diclofenac Sodium (VOLTAREN) 1 % GEL Apply 2 g topically 3 (three) times daily. Apply to neck and both shoulders     fluticasone (FLONASE) 50 MCG/ACT nasal spray Place 1 spray into both nostrils daily.     hydrALAZINE (APRESOLINE) 25 MG tablet Take 50 mg by mouth 3 (three) times daily.     insulin aspart (NOVOLOG) 100 UNIT/ML injection Inject 0-9 Units into the skin 3 (three) times daily with meals. CBG < 70: Implement Hypoglycemia protocol and recheck CBG.  CBG 70 - 120: 0 units CBG 121 - 150: 1 unit CBG 151 - 200: 2 units CBG 201 - 250: 3 units CBG 251 - 300: 5 units CBG 301 - 350: 7 units CBG 351 - 400: 9 units CBG > 400: call MD.     isosorbide mononitrate (IMDUR) 30 MG 24 hr tablet Take 30 mg by mouth in the morning.   0   lacosamide (VIMPAT) 50 MG TABS tablet Take 4.5 tablets (225 mg total) by mouth in the morning. 90 tablet 2   lacosamide 150 MG TABS Take 2 tablets (300 mg total) by mouth at bedtime. 60 tablet 1   levETIRAcetam (  KEPPRA) 500 MG tablet Take 1 tablet (500 mg total) by mouth 2 (two) times daily.     pantoprazole (PROTONIX) 40 MG tablet Take 1 tablet (40 mg total) by mouth 2 (two) times daily before a meal.     sodium bicarbonate 650 MG tablet Take 1 tablet  (650 mg total) by mouth 2 (two) times daily.     sucralfate (CARAFATE) 1 GM/10ML suspension Take 10 mLs (1 g total) by mouth 4 (four) times daily -  with meals and at bedtime for 14 days.     ZOLOFT 25 MG tablet Take 25 mg by mouth daily.     No current facility-administered medications for this visit.   Facility-Administered Medications Ordered in Other Visits  Medication Dose Route Frequency Provider Last Rate Last Admin   sodium chloride 0.9 % injection 10-40 mL  10-40 mL Intracatheter PRN Ladene Artist, MD   10 mL at 07/09/14 0501    Review of Systems: No chest pain. No shortness of breath. No urinary complaints.    Physical Exam  Wt Readings from Last 3 Encounters:  12/15/21 126 lb 12.2 oz (57.5 kg)  11/21/20 119 lb 14.9 oz (54.4 kg)  06/18/19 120 lb (54.4 kg)    BP (!) 140/68   Pulse 62   Ht _0  (1.676 m)   Wt 117 lb (53.1 kg)   SpO2 98%   BMI 18.88 kg/m  Constitutional:  Generally well appearing female in no acute distress. Psychiatric: Pleasant. Normal mood and affect. Behavior is normal. EENT: Pupils normal.  Conjunctivae are normal. No scleral icterus. Neck supple.  Cardiovascular: Normal rate, regular rhythm, murmur present.  Pulmonary/chest: Effort normal and breath sounds normal. No wheezing, rales or rhonchi. Abdominal: Soft, nondistended, nontender. Bowel sounds active throughout. There are no masses palpable. No hepatomegaly. Neurological: Alert and oriented to person place and time. Skin: Skin is warm and dry. No rashes noted.  I spent 30 minutes total reviewing records, obtaining history, performing exam, counseling patient and documenting visit / findings.    Tye Savoy, NP  02/24/2022, 2:20 PM

## 2022-02-24 NOTE — Patient Instructions (Addendum)
Your provider has requested that you go to the basement level for lab work before leaving today. Press "B" on the elevator. The lab is located at the first door on the left as you exit the elevator.   We have scheduled your follow up appointment with Dr.Mansouraty for 04/29/22 at 11:30   Let us know if you have any blood in stools or black stools.  Let us know if you have vomit blood again.  Stop Carafate  Continue Protonix 40 mg twice daily 30 minutes before breakfast and dinner.   Add daily Benefiber.   Acid Reflux  Below are some measures you can take to possibly improve acid reflux symptoms . We may have discussed some of these today in the office. Not everything on this list may apply to you   --If you are taking anti-reflux ( GERD) medication be sure to take it 30 minutes before breakfast and if taking twice daily then also second dose should be 30 minutes before dinner.   --Avoid late meals / bedtime snacks.   --Avoid trigger foods ( foods which you know tend to aggravate you reflux symptoms). Some common trigger foods include spicy foods, fatty foods, acidic foods, chocolate and caffeine.  --Elevate the head of bed 6-8 inches on blocks or bricks. If not able to elevate the head of the bed consider purchasing a wedge pillow to sleep on.    --Sometimes with the above mentioned "lifestyle changes" patients are able to reduce the amount of GERD medications they take. Our goal is to have you on the lowest effective dose of medication  Due to recent changes in healthcare laws, you may see the results of your imaging and laboratory studies on MyChart before your provider has had a chance to review them.  We understand that in some cases there may be results that are confusing or concerning to you. Not all laboratory results come back in the same time frame and the provider may be waiting for multiple results in order to interpret others.  Please give [place] 48 hours in order for your provider to  thoroughly review all the results before contacting the office for clarification of your results.    It was a pleasure to see you today!  Thank you for trusting me with your gastrointestinal care!

## 2022-02-28 NOTE — Progress Notes (Signed)
Attending Physician's Attestation   I have reviewed the chart.   I agree with the Advanced Practitioner's note, impression, and recommendations with any updates as below. We can certainly be available if things change.  Okay since they want to hold on aggressive endoscopic reevaluation currently to not perform EGD or colonoscopy.  If symptoms progress or worsen will need to consider PPI change as well as endoscopic reevaluation.   Justice Britain, MD [facility] Advanced [facility]oscopy Office # [phone]

## 2022-04-01 ENCOUNTER — Ambulatory Visit (HOSPITAL_COMMUNITY): Payer: Medicare Other

## 2022-04-01 DIAGNOSIS — R269 Unspecified abnormalities of gait and mobility: Secondary | ICD-10-CM | POA: Diagnosis not present

## 2022-04-01 DIAGNOSIS — Z9181 History of falling: Secondary | ICD-10-CM | POA: Diagnosis not present

## 2022-04-01 DIAGNOSIS — W19XXXA Unspecified fall, initial encounter: Secondary | ICD-10-CM

## 2022-04-01 DIAGNOSIS — R2689 Other abnormalities of gait and mobility: Secondary | ICD-10-CM | POA: Diagnosis not present

## 2022-04-01 DIAGNOSIS — M6281 Muscle weakness (generalized): Secondary | ICD-10-CM | POA: Diagnosis not present

## 2022-04-06 DIAGNOSIS — R52 Pain, unspecified: Secondary | ICD-10-CM | POA: Diagnosis not present

## 2022-04-06 DIAGNOSIS — I110 Hypertensive heart disease with heart failure: Secondary | ICD-10-CM | POA: Diagnosis not present

## 2022-04-06 DIAGNOSIS — M542 Cervicalgia: Secondary | ICD-10-CM | POA: Diagnosis not present

## 2022-04-11 DIAGNOSIS — N390 Urinary tract infection, site not specified: Secondary | ICD-10-CM | POA: Diagnosis not present

## 2022-04-11 DIAGNOSIS — R569 Unspecified convulsions: Secondary | ICD-10-CM

## 2022-04-11 DIAGNOSIS — Z5181 Encounter for therapeutic drug level monitoring: Secondary | ICD-10-CM

## 2022-04-11 DIAGNOSIS — Z87440 Personal history of urinary (tract) infections: Secondary | ICD-10-CM

## 2022-04-11 DIAGNOSIS — I110 Hypertensive heart disease with heart failure: Secondary | ICD-10-CM | POA: Diagnosis not present

## 2022-04-11 DIAGNOSIS — E860 Dehydration: Secondary | ICD-10-CM | POA: Diagnosis not present

## 2022-04-11 DIAGNOSIS — H109 Unspecified conjunctivitis: Secondary | ICD-10-CM | POA: Diagnosis not present

## 2022-04-15 ENCOUNTER — Encounter (HOSPITAL_COMMUNITY)
Admission: RE | Admit: 2022-04-15 | Discharge: 2022-04-15 | Disposition: A | Discharge status: Home / Self Care | Payer: Medicare Other | Source: Ambulatory Visit | Attending: Nephrology | Admitting: Nephrology

## 2022-04-15 DIAGNOSIS — D369 Benign neoplasm, unspecified site: Secondary | ICD-10-CM | POA: Insufficient documentation

## 2022-04-15 MED ORDER — TECHNETIUM TC 99M SESTAMIBI GENERIC - CARDIOLITE
24.0000 | Freq: Once | INTRAVENOUS | Status: AC | PRN
  Administered 2022-04-15: 24 via INTRAVENOUS

## 2022-04-29 ENCOUNTER — Ambulatory Visit: Payer: Medicare Other | Admitting: Gastroenterology

## 2022-05-11 DIAGNOSIS — I110 Hypertensive heart disease with heart failure: Secondary | ICD-10-CM | POA: Diagnosis not present

## 2022-05-11 DIAGNOSIS — Z87440 Personal history of urinary (tract) infections: Secondary | ICD-10-CM | POA: Diagnosis not present

## 2022-05-11 DIAGNOSIS — I5032 Chronic diastolic (congestive) heart failure: Secondary | ICD-10-CM | POA: Diagnosis not present

## 2022-05-18 DIAGNOSIS — Z87440 Personal history of urinary (tract) infections: Secondary | ICD-10-CM | POA: Diagnosis not present

## 2022-05-18 DIAGNOSIS — N184 Chronic kidney disease, stage 4 (severe): Secondary | ICD-10-CM | POA: Diagnosis not present

## 2022-05-18 DIAGNOSIS — I129 Hypertensive chronic kidney disease with stage 1 through stage 4 chronic kidney disease, or unspecified chronic kidney disease: Secondary | ICD-10-CM | POA: Diagnosis not present

## 2022-05-27 DIAGNOSIS — R569 Unspecified convulsions: Secondary | ICD-10-CM

## 2022-05-27 DIAGNOSIS — I110 Hypertensive heart disease with heart failure: Secondary | ICD-10-CM | POA: Diagnosis not present

## 2022-05-27 DIAGNOSIS — E1140 Type 2 diabetes mellitus with diabetic neuropathy, unspecified: Secondary | ICD-10-CM | POA: Diagnosis not present

## 2022-05-27 DIAGNOSIS — I5032 Chronic diastolic (congestive) heart failure: Secondary | ICD-10-CM | POA: Diagnosis not present

## 2022-05-27 DIAGNOSIS — G589 Mononeuropathy, unspecified: Secondary | ICD-10-CM | POA: Diagnosis not present

## 2022-05-27 DIAGNOSIS — D631 Anemia in chronic kidney disease: Secondary | ICD-10-CM

## 2022-05-27 DIAGNOSIS — N1832 Chronic kidney disease, stage 3b: Secondary | ICD-10-CM

## 2022-06-08 DIAGNOSIS — F0390 Unspecified dementia without behavioral disturbance: Secondary | ICD-10-CM

## 2022-06-08 DIAGNOSIS — D638 Anemia in other chronic diseases classified elsewhere: Secondary | ICD-10-CM

## 2022-06-08 DIAGNOSIS — N184 Chronic kidney disease, stage 4 (severe): Secondary | ICD-10-CM

## 2022-06-08 DIAGNOSIS — R799 Abnormal finding of blood chemistry, unspecified: Secondary | ICD-10-CM

## 2022-06-08 DIAGNOSIS — E1122 Type 2 diabetes mellitus with diabetic chronic kidney disease: Secondary | ICD-10-CM

## 2022-06-20 DIAGNOSIS — R52 Pain, unspecified: Secondary | ICD-10-CM | POA: Diagnosis not present

## 2022-06-20 DIAGNOSIS — L299 Pruritus, unspecified: Secondary | ICD-10-CM | POA: Diagnosis not present

## 2022-06-20 DIAGNOSIS — L304 Erythema intertrigo: Secondary | ICD-10-CM | POA: Diagnosis not present

## 2022-06-20 DIAGNOSIS — R32 Unspecified urinary incontinence: Secondary | ICD-10-CM | POA: Diagnosis not present

## 2022-07-13 DIAGNOSIS — E119 Type 2 diabetes mellitus without complications: Secondary | ICD-10-CM | POA: Diagnosis not present

## 2022-07-13 DIAGNOSIS — R635 Abnormal weight gain: Secondary | ICD-10-CM | POA: Diagnosis not present

## 2022-08-12 DIAGNOSIS — L299 Pruritus, unspecified: Secondary | ICD-10-CM

## 2022-08-12 DIAGNOSIS — L853 Xerosis cutis: Secondary | ICD-10-CM

## 2022-09-07 DIAGNOSIS — E119 Type 2 diabetes mellitus without complications: Secondary | ICD-10-CM | POA: Diagnosis not present

## 2022-09-07 DIAGNOSIS — R634 Abnormal weight loss: Secondary | ICD-10-CM | POA: Diagnosis not present

## 2022-09-10 DIAGNOSIS — I361 Nonrheumatic tricuspid (valve) insufficiency: Secondary | ICD-10-CM

## 2022-09-14 DIAGNOSIS — I129 Hypertensive chronic kidney disease with stage 1 through stage 4 chronic kidney disease, or unspecified chronic kidney disease: Secondary | ICD-10-CM | POA: Diagnosis not present

## 2022-09-14 DIAGNOSIS — N184 Chronic kidney disease, stage 4 (severe): Secondary | ICD-10-CM

## 2022-09-14 DIAGNOSIS — E1122 Type 2 diabetes mellitus with diabetic chronic kidney disease: Secondary | ICD-10-CM

## 2022-09-14 DIAGNOSIS — E785 Hyperlipidemia, unspecified: Secondary | ICD-10-CM

## 2022-09-14 DIAGNOSIS — G40909 Epilepsy, unspecified, not intractable, without status epilepticus: Secondary | ICD-10-CM

## 2022-09-14 DIAGNOSIS — R4781 Slurred speech: Secondary | ICD-10-CM | POA: Diagnosis not present

## 2022-09-14 DIAGNOSIS — M6281 Muscle weakness (generalized): Secondary | ICD-10-CM

## 2022-09-14 DIAGNOSIS — D649 Anemia, unspecified: Secondary | ICD-10-CM | POA: Diagnosis not present

## 2022-09-14 DIAGNOSIS — Z789 Other specified health status: Secondary | ICD-10-CM

## 2022-09-14 DIAGNOSIS — R269 Unspecified abnormalities of gait and mobility: Secondary | ICD-10-CM

## 2022-09-23 DIAGNOSIS — D649 Anemia, unspecified: Secondary | ICD-10-CM | POA: Diagnosis not present

## 2022-09-23 DIAGNOSIS — D72829 Elevated white blood cell count, unspecified: Secondary | ICD-10-CM | POA: Diagnosis not present

## 2022-09-23 DIAGNOSIS — R799 Abnormal finding of blood chemistry, unspecified: Secondary | ICD-10-CM

## 2022-09-23 DIAGNOSIS — N184 Chronic kidney disease, stage 4 (severe): Secondary | ICD-10-CM | POA: Diagnosis not present

## 2022-09-26 DIAGNOSIS — G894 Chronic pain syndrome: Secondary | ICD-10-CM | POA: Diagnosis not present

## 2022-09-26 DIAGNOSIS — Z9181 History of falling: Secondary | ICD-10-CM

## 2022-09-26 DIAGNOSIS — M542 Cervicalgia: Secondary | ICD-10-CM | POA: Diagnosis not present

## 2022-10-10 DIAGNOSIS — M542 Cervicalgia: Secondary | ICD-10-CM | POA: Diagnosis not present

## 2022-10-10 DIAGNOSIS — G894 Chronic pain syndrome: Secondary | ICD-10-CM | POA: Diagnosis not present

## 2022-10-10 DIAGNOSIS — M25511 Pain in right shoulder: Secondary | ICD-10-CM | POA: Diagnosis not present

## 2022-10-24 DIAGNOSIS — M6281 Muscle weakness (generalized): Secondary | ICD-10-CM | POA: Diagnosis not present

## 2022-10-24 DIAGNOSIS — Z7689 Persons encountering health services in other specified circumstances: Secondary | ICD-10-CM

## 2022-10-24 DIAGNOSIS — R202 Paresthesia of skin: Secondary | ICD-10-CM | POA: Diagnosis not present

## 2022-10-24 DIAGNOSIS — I119 Hypertensive heart disease without heart failure: Secondary | ICD-10-CM | POA: Diagnosis not present

## 2022-10-24 DIAGNOSIS — R52 Pain, unspecified: Secondary | ICD-10-CM | POA: Diagnosis not present

## 2022-11-14 DIAGNOSIS — N184 Chronic kidney disease, stage 4 (severe): Secondary | ICD-10-CM | POA: Diagnosis not present

## 2022-11-14 DIAGNOSIS — I129 Hypertensive chronic kidney disease with stage 1 through stage 4 chronic kidney disease, or unspecified chronic kidney disease: Secondary | ICD-10-CM | POA: Diagnosis not present

## 2022-11-21 DIAGNOSIS — W19XXXA Unspecified fall, initial encounter: Secondary | ICD-10-CM

## 2022-11-21 DIAGNOSIS — Z9181 History of falling: Secondary | ICD-10-CM | POA: Diagnosis not present

## 2022-11-21 DIAGNOSIS — M6281 Muscle weakness (generalized): Secondary | ICD-10-CM | POA: Diagnosis not present

## 2022-11-21 DIAGNOSIS — F0390 Unspecified dementia without behavioral disturbance: Secondary | ICD-10-CM | POA: Diagnosis not present

## 2022-11-21 DIAGNOSIS — R2689 Other abnormalities of gait and mobility: Secondary | ICD-10-CM | POA: Diagnosis not present

## 2022-12-05 DIAGNOSIS — R2689 Other abnormalities of gait and mobility: Secondary | ICD-10-CM | POA: Diagnosis not present

## 2022-12-05 DIAGNOSIS — W19XXXA Unspecified fall, initial encounter: Secondary | ICD-10-CM

## 2022-12-05 DIAGNOSIS — Z87440 Personal history of urinary (tract) infections: Secondary | ICD-10-CM | POA: Diagnosis not present

## 2022-12-05 DIAGNOSIS — M25551 Pain in right hip: Secondary | ICD-10-CM | POA: Diagnosis not present

## 2022-12-05 DIAGNOSIS — Z9181 History of falling: Secondary | ICD-10-CM

## 2022-12-05 DIAGNOSIS — M6281 Muscle weakness (generalized): Secondary | ICD-10-CM | POA: Diagnosis not present

## 2022-12-07 DIAGNOSIS — W19XXXA Unspecified fall, initial encounter: Secondary | ICD-10-CM

## 2022-12-07 DIAGNOSIS — M25551 Pain in right hip: Secondary | ICD-10-CM | POA: Diagnosis not present

## 2022-12-07 DIAGNOSIS — M6281 Muscle weakness (generalized): Secondary | ICD-10-CM | POA: Diagnosis not present

## 2022-12-07 DIAGNOSIS — R2689 Other abnormalities of gait and mobility: Secondary | ICD-10-CM | POA: Diagnosis not present

## 2022-12-07 DIAGNOSIS — Z9181 History of falling: Secondary | ICD-10-CM | POA: Diagnosis not present

## 2022-12-12 DIAGNOSIS — M25551 Pain in right hip: Secondary | ICD-10-CM | POA: Diagnosis not present

## 2022-12-12 DIAGNOSIS — W19XXXA Unspecified fall, initial encounter: Secondary | ICD-10-CM

## 2022-12-12 DIAGNOSIS — M6281 Muscle weakness (generalized): Secondary | ICD-10-CM | POA: Diagnosis not present

## 2022-12-12 DIAGNOSIS — R2689 Other abnormalities of gait and mobility: Secondary | ICD-10-CM | POA: Diagnosis not present

## 2022-12-12 DIAGNOSIS — M1611 Unilateral primary osteoarthritis, right hip: Secondary | ICD-10-CM | POA: Diagnosis not present

## 2022-12-13 DIAGNOSIS — I110 Hypertensive heart disease with heart failure: Secondary | ICD-10-CM | POA: Diagnosis not present

## 2022-12-13 DIAGNOSIS — M1611 Unilateral primary osteoarthritis, right hip: Secondary | ICD-10-CM | POA: Diagnosis not present

## 2022-12-13 DIAGNOSIS — S3219XD Other fracture of sacrum, subsequent encounter for fracture with routine healing: Secondary | ICD-10-CM | POA: Diagnosis not present

## 2022-12-13 DIAGNOSIS — S32511G Fracture of superior rim of right pubis, subsequent encounter for fracture with delayed healing: Secondary | ICD-10-CM | POA: Diagnosis not present

## 2022-12-16 DIAGNOSIS — M25551 Pain in right hip: Secondary | ICD-10-CM | POA: Diagnosis not present

## 2022-12-16 DIAGNOSIS — M6281 Muscle weakness (generalized): Secondary | ICD-10-CM | POA: Diagnosis not present

## 2022-12-16 DIAGNOSIS — Z9181 History of falling: Secondary | ICD-10-CM

## 2022-12-16 DIAGNOSIS — S32591A Other specified fracture of right pubis, initial encounter for closed fracture: Secondary | ICD-10-CM

## 2022-12-16 DIAGNOSIS — I700 Atherosclerosis of aorta: Secondary | ICD-10-CM | POA: Diagnosis not present

## 2022-12-16 DIAGNOSIS — S32110A Nondisplaced Zone I fracture of sacrum, initial encounter for closed fracture: Secondary | ICD-10-CM | POA: Diagnosis not present

## 2022-12-16 DIAGNOSIS — S32511A Fracture of superior rim of right pubis, initial encounter for closed fracture: Secondary | ICD-10-CM

## 2022-12-16 DIAGNOSIS — S32501A Unspecified fracture of right pubis, initial encounter for closed fracture: Secondary | ICD-10-CM

## 2022-12-16 DIAGNOSIS — S7001XA Contusion of right hip, initial encounter: Secondary | ICD-10-CM

## 2022-12-16 DIAGNOSIS — W19XXXS Unspecified fall, sequela: Secondary | ICD-10-CM

## 2022-12-30 DIAGNOSIS — Z23 Encounter for immunization: Secondary | ICD-10-CM | POA: Diagnosis not present

## 2023-01-09 DIAGNOSIS — R269 Unspecified abnormalities of gait and mobility: Secondary | ICD-10-CM | POA: Diagnosis not present

## 2023-01-09 DIAGNOSIS — Z9181 History of falling: Secondary | ICD-10-CM

## 2023-01-09 DIAGNOSIS — G894 Chronic pain syndrome: Secondary | ICD-10-CM | POA: Diagnosis not present

## 2023-01-09 DIAGNOSIS — F0390 Unspecified dementia without behavioral disturbance: Secondary | ICD-10-CM | POA: Diagnosis not present

## 2023-01-09 DIAGNOSIS — M6281 Muscle weakness (generalized): Secondary | ICD-10-CM | POA: Diagnosis not present

## 2023-01-09 DIAGNOSIS — Z8781 Personal history of (healed) traumatic fracture: Secondary | ICD-10-CM

## 2023-01-13 DIAGNOSIS — N184 Chronic kidney disease, stage 4 (severe): Secondary | ICD-10-CM | POA: Diagnosis not present

## 2023-01-13 DIAGNOSIS — F0390 Unspecified dementia without behavioral disturbance: Secondary | ICD-10-CM

## 2023-01-13 DIAGNOSIS — D649 Anemia, unspecified: Secondary | ICD-10-CM | POA: Diagnosis not present

## 2023-01-13 DIAGNOSIS — R569 Unspecified convulsions: Secondary | ICD-10-CM | POA: Diagnosis not present

## 2023-02-13 DIAGNOSIS — F0390 Unspecified dementia without behavioral disturbance: Secondary | ICD-10-CM | POA: Diagnosis not present

## 2023-02-13 DIAGNOSIS — M6281 Muscle weakness (generalized): Secondary | ICD-10-CM | POA: Diagnosis not present

## 2023-02-13 DIAGNOSIS — W19XXXA Unspecified fall, initial encounter: Secondary | ICD-10-CM

## 2023-02-13 DIAGNOSIS — R2689 Other abnormalities of gait and mobility: Secondary | ICD-10-CM | POA: Diagnosis not present

## 2023-02-13 DIAGNOSIS — S0081XA Abrasion of other part of head, initial encounter: Secondary | ICD-10-CM

## 2023-02-13 DIAGNOSIS — Z87440 Personal history of urinary (tract) infections: Secondary | ICD-10-CM | POA: Diagnosis not present

## 2023-02-13 DIAGNOSIS — R569 Unspecified convulsions: Secondary | ICD-10-CM

## 2023-02-13 DIAGNOSIS — Z9181 History of falling: Secondary | ICD-10-CM

## 2023-02-16 DIAGNOSIS — M6281 Muscle weakness (generalized): Secondary | ICD-10-CM | POA: Diagnosis not present

## 2023-02-16 DIAGNOSIS — Z9181 History of falling: Secondary | ICD-10-CM

## 2023-02-16 DIAGNOSIS — Z87440 Personal history of urinary (tract) infections: Secondary | ICD-10-CM

## 2023-02-16 DIAGNOSIS — I10 Essential (primary) hypertension: Secondary | ICD-10-CM | POA: Diagnosis not present

## 2023-02-16 DIAGNOSIS — F0390 Unspecified dementia without behavioral disturbance: Secondary | ICD-10-CM | POA: Diagnosis not present

## 2023-02-16 DIAGNOSIS — R5381 Other malaise: Secondary | ICD-10-CM | POA: Diagnosis not present

## 2023-03-03 DIAGNOSIS — E785 Hyperlipidemia, unspecified: Secondary | ICD-10-CM | POA: Diagnosis not present

## 2023-03-03 DIAGNOSIS — I110 Hypertensive heart disease with heart failure: Secondary | ICD-10-CM | POA: Diagnosis not present

## 2023-03-03 DIAGNOSIS — I5032 Chronic diastolic (congestive) heart failure: Secondary | ICD-10-CM | POA: Diagnosis not present

## 2023-03-03 DIAGNOSIS — Z681 Body mass index (BMI) 19 or less, adult: Secondary | ICD-10-CM

## 2023-03-03 DIAGNOSIS — R569 Unspecified convulsions: Secondary | ICD-10-CM

## 2023-03-03 DIAGNOSIS — F0390 Unspecified dementia without behavioral disturbance: Secondary | ICD-10-CM | POA: Diagnosis not present

## 2023-03-03 DIAGNOSIS — M6281 Muscle weakness (generalized): Secondary | ICD-10-CM

## 2023-03-09 DIAGNOSIS — D649 Anemia, unspecified: Secondary | ICD-10-CM | POA: Diagnosis not present

## 2023-03-09 DIAGNOSIS — F0390 Unspecified dementia without behavioral disturbance: Secondary | ICD-10-CM | POA: Diagnosis not present

## 2023-03-09 DIAGNOSIS — E1122 Type 2 diabetes mellitus with diabetic chronic kidney disease: Secondary | ICD-10-CM | POA: Diagnosis not present

## 2023-03-09 DIAGNOSIS — N184 Chronic kidney disease, stage 4 (severe): Secondary | ICD-10-CM | POA: Diagnosis not present

## 2023-03-15 DIAGNOSIS — Z9181 History of falling: Secondary | ICD-10-CM

## 2023-03-15 DIAGNOSIS — M6281 Muscle weakness (generalized): Secondary | ICD-10-CM | POA: Diagnosis not present

## 2023-03-15 DIAGNOSIS — F0390 Unspecified dementia without behavioral disturbance: Secondary | ICD-10-CM | POA: Diagnosis not present

## 2023-03-15 DIAGNOSIS — M25551 Pain in right hip: Secondary | ICD-10-CM | POA: Diagnosis not present

## 2023-03-15 DIAGNOSIS — R2689 Other abnormalities of gait and mobility: Secondary | ICD-10-CM | POA: Diagnosis not present

## 2023-03-31 DIAGNOSIS — M6281 Muscle weakness (generalized): Secondary | ICD-10-CM | POA: Diagnosis not present

## 2023-03-31 DIAGNOSIS — F0390 Unspecified dementia without behavioral disturbance: Secondary | ICD-10-CM | POA: Diagnosis not present

## 2023-03-31 DIAGNOSIS — S6992XA Unspecified injury of left wrist, hand and finger(s), initial encounter: Secondary | ICD-10-CM | POA: Diagnosis not present

## 2023-04-14 DIAGNOSIS — I10 Essential (primary) hypertension: Secondary | ICD-10-CM

## 2023-04-14 DIAGNOSIS — R569 Unspecified convulsions: Secondary | ICD-10-CM

## 2023-04-14 DIAGNOSIS — R5381 Other malaise: Secondary | ICD-10-CM | POA: Diagnosis not present

## 2023-04-14 DIAGNOSIS — E119 Type 2 diabetes mellitus without complications: Secondary | ICD-10-CM | POA: Diagnosis not present

## 2023-04-14 DIAGNOSIS — F0390 Unspecified dementia without behavioral disturbance: Secondary | ICD-10-CM | POA: Diagnosis not present

## 2023-04-17 DIAGNOSIS — F0390 Unspecified dementia without behavioral disturbance: Secondary | ICD-10-CM

## 2023-04-17 DIAGNOSIS — M25551 Pain in right hip: Secondary | ICD-10-CM | POA: Diagnosis not present

## 2023-04-17 DIAGNOSIS — R2689 Other abnormalities of gait and mobility: Secondary | ICD-10-CM

## 2023-04-17 DIAGNOSIS — M6281 Muscle weakness (generalized): Secondary | ICD-10-CM | POA: Diagnosis not present

## 2023-04-17 DIAGNOSIS — G894 Chronic pain syndrome: Secondary | ICD-10-CM | POA: Diagnosis not present

## 2023-04-17 DIAGNOSIS — Z9181 History of falling: Secondary | ICD-10-CM

## 2023-04-17 DIAGNOSIS — M25511 Pain in right shoulder: Secondary | ICD-10-CM | POA: Diagnosis not present

## 2023-04-17 DIAGNOSIS — Z8673 Personal history of transient ischemic attack (TIA), and cerebral infarction without residual deficits: Secondary | ICD-10-CM

## 2023-05-03 DIAGNOSIS — F0390 Unspecified dementia without behavioral disturbance: Secondary | ICD-10-CM | POA: Diagnosis not present

## 2023-05-03 DIAGNOSIS — M6281 Muscle weakness (generalized): Secondary | ICD-10-CM | POA: Diagnosis not present

## 2023-05-03 DIAGNOSIS — M1990 Unspecified osteoarthritis, unspecified site: Secondary | ICD-10-CM | POA: Diagnosis not present

## 2023-05-03 DIAGNOSIS — G894 Chronic pain syndrome: Secondary | ICD-10-CM | POA: Diagnosis not present

## 2023-05-03 DIAGNOSIS — Z9181 History of falling: Secondary | ICD-10-CM

## 2023-05-05 DIAGNOSIS — Z9181 History of falling: Secondary | ICD-10-CM | POA: Diagnosis not present

## 2023-05-05 DIAGNOSIS — M6281 Muscle weakness (generalized): Secondary | ICD-10-CM | POA: Diagnosis not present

## 2023-05-05 DIAGNOSIS — F0390 Unspecified dementia without behavioral disturbance: Secondary | ICD-10-CM | POA: Diagnosis not present

## 2023-05-05 DIAGNOSIS — R2689 Other abnormalities of gait and mobility: Secondary | ICD-10-CM | POA: Diagnosis not present

## 2023-06-01 DIAGNOSIS — Z87440 Personal history of urinary (tract) infections: Secondary | ICD-10-CM

## 2023-06-01 DIAGNOSIS — B9620 Unspecified Escherichia coli [E. coli] as the cause of diseases classified elsewhere: Secondary | ICD-10-CM | POA: Diagnosis not present

## 2023-06-01 DIAGNOSIS — F0390 Unspecified dementia without behavioral disturbance: Secondary | ICD-10-CM | POA: Diagnosis not present

## 2023-06-01 DIAGNOSIS — N390 Urinary tract infection, site not specified: Secondary | ICD-10-CM | POA: Diagnosis not present

## 2023-06-01 DIAGNOSIS — R531 Weakness: Secondary | ICD-10-CM | POA: Diagnosis not present

## 2023-06-01 DIAGNOSIS — R5381 Other malaise: Secondary | ICD-10-CM

## 2023-06-14 DIAGNOSIS — E1159 Type 2 diabetes mellitus with other circulatory complications: Secondary | ICD-10-CM | POA: Diagnosis not present

## 2023-06-14 DIAGNOSIS — M6281 Muscle weakness (generalized): Secondary | ICD-10-CM

## 2023-06-14 DIAGNOSIS — L84 Corns and callosities: Secondary | ICD-10-CM | POA: Diagnosis not present

## 2023-06-14 DIAGNOSIS — R52 Pain, unspecified: Secondary | ICD-10-CM

## 2023-06-14 DIAGNOSIS — M79674 Pain in right toe(s): Secondary | ICD-10-CM | POA: Diagnosis not present

## 2023-06-14 DIAGNOSIS — F0390 Unspecified dementia without behavioral disturbance: Secondary | ICD-10-CM

## 2023-06-14 DIAGNOSIS — M79675 Pain in left toe(s): Secondary | ICD-10-CM | POA: Diagnosis not present

## 2023-06-28 DIAGNOSIS — F0390 Unspecified dementia without behavioral disturbance: Secondary | ICD-10-CM | POA: Diagnosis not present

## 2023-06-28 DIAGNOSIS — G894 Chronic pain syndrome: Secondary | ICD-10-CM | POA: Diagnosis not present

## 2023-06-28 DIAGNOSIS — M1990 Unspecified osteoarthritis, unspecified site: Secondary | ICD-10-CM | POA: Diagnosis not present

## 2023-07-17 ENCOUNTER — Encounter: Payer: Self-pay | Admitting: Podiatry

## 2023-07-17 ENCOUNTER — Ambulatory Visit: Admitting: Podiatry

## 2023-07-17 DIAGNOSIS — L84 Corns and callosities: Secondary | ICD-10-CM

## 2023-07-17 DIAGNOSIS — L97511 Non-pressure chronic ulcer of other part of right foot limited to breakdown of skin: Secondary | ICD-10-CM

## 2023-07-17 DIAGNOSIS — E1142 Type 2 diabetes mellitus with diabetic polyneuropathy: Secondary | ICD-10-CM

## 2023-07-17 DIAGNOSIS — B351 Tinea unguium: Secondary | ICD-10-CM | POA: Diagnosis not present

## 2023-07-17 DIAGNOSIS — M79674 Pain in right toe(s): Secondary | ICD-10-CM

## 2023-07-17 DIAGNOSIS — M79675 Pain in left toe(s): Secondary | ICD-10-CM | POA: Diagnosis not present

## 2023-07-17 MED ORDER — AMMONIUM LACTATE 12 % EX CREA: 1.0000 | TOPICAL_CREAM | CUTANEOUS | 0 refills | Status: AC | PRN

## 2023-07-17 MED ORDER — MUPIROCIN 2 % EX OINT: 1.0000 | TOPICAL_OINTMENT | Freq: Two times a day (BID) | CUTANEOUS | 2 refills | Status: DC

## 2023-07-17 NOTE — Progress Notes (Signed)
  Subjective:  Patient ID: Michelle Farrell, female    DOB: Nov 09, 1940,  MRN: [id number]  Chief Complaint  Patient presents with   [facility]    [facility] with callous, bilateral. No information as to A1c or if Meds are correct. I got no paper work.     83 y.o. female presents with the above complaint. History confirmed with patient. Patient presenting with pain related to dystrophic thickened elongated nails. Patient is unable to trim own nails related to nail dystrophy and mobility issues. Patient does have a history of T2DM. Patient does/does not have callus present located at the right subfirst metatarsal head and left first toe plantar medial aspect causing pain.  History of stroke and CHF as well.  Patient currently using wheelchair.  She comes from a facility.  Objective:  Physical Exam: warm, capillary refill 3 to 5 seconds to the digits, pedal hair growth absent, pedal skin atrophic nail exam onychomycosis of the toenails, onycholysis, and dystrophic nails DP pulses palpable, PT pulses faintly palpable 1/4, and protective sensation absent Left Foot:  Pain with palpation of nails due to elongation and dystrophic growth.  Hemorrhagic preulcerative callus present left first toe plantar medial aspect head of proximal phalanx Right Foot: Pain with palpation of nails due to elongation and dystrophic growth.  Hemorrhagic preulcerative callus subfirst metatarsal head with area of underlying partial-thickness skin breakdown centrally.  Assessment:   1. Pain due to onychomycosis of toenails of both feet   2. Diabetic polyneuropathy associated with type 2 diabetes mellitus (HCC)   3. Pre-ulcerative calluses   4. Ulcer of right foot, limited to breakdown of skin [facility])      Plan:  Patient was evaluated and treated and all questions answered.  #Hyperkeratotic lesions/pre ulcerative calluses present right subfirst metatarsal head, left first toe All symptomatic hyperkeratoses x 2 separate lesions were  safely debrided with a sterile #10 blade to patient's level of comfort without incident. We discussed preventative and palliative care of these lesions including supportive and accommodative shoegear, padding, prefabricated and custom molded accommodative orthoses, use of a pumice stone and lotions/creams daily. - Underlying partial-thickness skin breakdown of the right subfirst metatarsal head noted.  Dressed with mupirocin .  Reviewed daily dressing changes with patient.  Rx for mupirocin  and for AmLactin dispensed to patient - Close monitoring of the left first toe with near breakdown of skin as well - Dancers pad applied today for right subfirst metatarsal lesion  #Onychomycosis with pain  -Nails palliatively debrided as below. -Educated on self-care  Procedure: Nail Debridement Rationale: Pain Type of Debridement: manual, sharp debridement. Instrumentation: Nail nipper, rotary burr. Number of Nails: 10  Return in about 3 weeks (around 08/07/2023) for Ulcer Check.         Eve Hinders, DPM [facility] / [facility]

## 2023-07-17 NOTE — Patient Instructions (Signed)
 More silicone pads can be purchased from:  [email or web address]

## 2023-08-04 DIAGNOSIS — K219 Gastro-esophageal reflux disease without esophagitis: Secondary | ICD-10-CM | POA: Diagnosis not present

## 2023-08-04 DIAGNOSIS — R569 Unspecified convulsions: Secondary | ICD-10-CM | POA: Diagnosis not present

## 2023-08-04 DIAGNOSIS — M1990 Unspecified osteoarthritis, unspecified site: Secondary | ICD-10-CM | POA: Diagnosis not present

## 2023-08-04 DIAGNOSIS — E1140 Type 2 diabetes mellitus with diabetic neuropathy, unspecified: Secondary | ICD-10-CM | POA: Diagnosis not present

## 2023-08-04 DIAGNOSIS — M6281 Muscle weakness (generalized): Secondary | ICD-10-CM | POA: Diagnosis not present

## 2023-08-04 DIAGNOSIS — I110 Hypertensive heart disease with heart failure: Secondary | ICD-10-CM | POA: Diagnosis not present

## 2023-08-04 DIAGNOSIS — F0390 Unspecified dementia without behavioral disturbance: Secondary | ICD-10-CM

## 2023-08-04 DIAGNOSIS — G894 Chronic pain syndrome: Secondary | ICD-10-CM | POA: Diagnosis not present

## 2023-08-04 DIAGNOSIS — N1832 Chronic kidney disease, stage 3b: Secondary | ICD-10-CM | POA: Diagnosis not present

## 2023-08-04 DIAGNOSIS — I509 Heart failure, unspecified: Secondary | ICD-10-CM | POA: Diagnosis not present

## 2023-08-07 ENCOUNTER — Ambulatory Visit: Admitting: Podiatry

## 2023-08-07 DIAGNOSIS — I739 Peripheral vascular disease, unspecified: Secondary | ICD-10-CM

## 2023-08-07 DIAGNOSIS — L84 Corns and callosities: Secondary | ICD-10-CM

## 2023-08-07 DIAGNOSIS — E1142 Type 2 diabetes mellitus with diabetic polyneuropathy: Secondary | ICD-10-CM

## 2023-08-07 NOTE — Progress Notes (Unsigned)
  Subjective:  Patient ID: Michelle Farrell, female    DOB: January 25, 1941,  MRN: [id number]  Chief Complaint  Patient presents with   Diabetic Ulcer    Right foot ulcer, is calloused over and the left first toe is calloused as well with a dark center. She doesn't hear well, but they hurt.  No recent A1c listed and she does not know her numbers. No anti coag. Listed.     83 y.o. female presents with the above complaint. History confirmed with patient.  Following up for ulcerations with partial thickness of breakdown to her left first toe and to right subfirst metatarsal head.  Patient does have a history of T2DM. History of stroke and CHF as well. She comes from a facility.  Objective:  Physical Exam: warm, capillary refill 3 to 5 seconds to the digits, pedal hair growth absent, pedal skin atrophic nail exam onychomycosis of the toenails, onycholysis, and dystrophic nails DP pulses faintly palpable, PT pulses non palpable, and protective sensation absent Left Foot:  Pain with palpation of nails due to elongation and dystrophic growth.  Hemorrhagic preulcerative callus present left first toe plantar medial aspect head of proximal phalanx.  Near full-thickness skin breakdown noted.  Measures 1 cm in diameter Right Foot: Pain with palpation of nails due to elongation and dystrophic growth.  Hemorrhagic preulcerative callus subfirst metatarsal head with area of underlying partial-thickness skin breakdown centrally.  Near full-thickness skin breakdown noted.  Measures 0.8 cm in diameter  Assessment:   1. Diabetic polyneuropathy associated with type 2 diabetes mellitus (HCC)   2. Pre-ulcerative calluses   3. PAD (peripheral artery disease) (HCC)      Plan:  Patient was evaluated and treated and all questions answered.  # Partial-thickness skin breakdown to the right subfirst metatarsal head ulceration, left first toe ulceration -Medically necessary excisional debridement with 312 scalpel blade of  hemorrhagic callus of nonviable skin and fibrin to wound beds noted.  Fortunately this is limited to partial-thickness skin breakdown.  Does not quite reach subcutaneous tissue however remaining underlying skin is quite thin and atrophic.  The area of maximum involvement right subfirst metatarsal head 1 cm in diameter, left plantar medial first toe 0.8 cm in diameter. -Both areas were dressed with mupirocin  and bandaged - Offloading felt padding applied - Written instructions for offloading of the ulcerations with daily applications of the mupirocin  with close monitoring provided for facility. - Ordering baseline ABIs in the event that there is progression of the ulcerations   Return in about 9 weeks (around 10/09/2023) for Diabetic Foot Care.         Eve Hinders, DPM [facility] / [facility]

## 2023-08-08 DIAGNOSIS — E119 Type 2 diabetes mellitus without complications: Secondary | ICD-10-CM | POA: Diagnosis not present

## 2023-08-08 DIAGNOSIS — Z79899 Other long term (current) drug therapy: Secondary | ICD-10-CM | POA: Diagnosis not present

## 2023-08-08 DIAGNOSIS — I10 Essential (primary) hypertension: Secondary | ICD-10-CM | POA: Diagnosis not present

## 2023-08-08 DIAGNOSIS — D649 Anemia, unspecified: Secondary | ICD-10-CM | POA: Diagnosis not present

## 2023-08-09 ENCOUNTER — Encounter: Payer: Self-pay | Admitting: Podiatry

## 2023-08-09 DIAGNOSIS — F0390 Unspecified dementia without behavioral disturbance: Secondary | ICD-10-CM | POA: Diagnosis not present

## 2023-08-09 DIAGNOSIS — E1122 Type 2 diabetes mellitus with diabetic chronic kidney disease: Secondary | ICD-10-CM | POA: Diagnosis not present

## 2023-08-09 DIAGNOSIS — D649 Anemia, unspecified: Secondary | ICD-10-CM | POA: Diagnosis not present

## 2023-08-09 DIAGNOSIS — N184 Chronic kidney disease, stage 4 (severe): Secondary | ICD-10-CM | POA: Diagnosis not present

## 2023-08-17 DIAGNOSIS — E1159 Type 2 diabetes mellitus with other circulatory complications: Secondary | ICD-10-CM | POA: Diagnosis not present

## 2023-08-17 DIAGNOSIS — B351 Tinea unguium: Secondary | ICD-10-CM | POA: Diagnosis not present

## 2023-08-24 ENCOUNTER — Ambulatory Visit (HOSPITAL_COMMUNITY)
Admission: RE | Admit: 2023-08-24 | Discharge: 2023-08-24 | Disposition: A | Discharge status: Home / Self Care | Source: Ambulatory Visit | Attending: Podiatry | Admitting: Podiatry

## 2023-08-24 DIAGNOSIS — I739 Peripheral vascular disease, unspecified: Secondary | ICD-10-CM

## 2023-08-24 LAB — VAS US ABI WITH/WO TBI: Right ABI: 1.26

## 2023-09-13 DIAGNOSIS — R52 Pain, unspecified: Secondary | ICD-10-CM | POA: Diagnosis not present

## 2023-09-13 DIAGNOSIS — B351 Tinea unguium: Secondary | ICD-10-CM | POA: Diagnosis not present

## 2023-09-13 DIAGNOSIS — F0390 Unspecified dementia without behavioral disturbance: Secondary | ICD-10-CM | POA: Diagnosis not present

## 2023-09-13 DIAGNOSIS — R2689 Other abnormalities of gait and mobility: Secondary | ICD-10-CM | POA: Diagnosis not present

## 2023-09-13 DIAGNOSIS — Z9181 History of falling: Secondary | ICD-10-CM | POA: Diagnosis not present

## 2023-09-13 DIAGNOSIS — M6281 Muscle weakness (generalized): Secondary | ICD-10-CM | POA: Diagnosis not present

## 2023-09-13 DIAGNOSIS — L84 Corns and callosities: Secondary | ICD-10-CM | POA: Diagnosis not present

## 2023-09-20 DIAGNOSIS — R52 Pain, unspecified: Secondary | ICD-10-CM | POA: Diagnosis not present

## 2023-09-20 DIAGNOSIS — N644 Mastodynia: Secondary | ICD-10-CM | POA: Diagnosis not present

## 2023-09-20 DIAGNOSIS — Z9181 History of falling: Secondary | ICD-10-CM | POA: Diagnosis not present

## 2023-09-20 DIAGNOSIS — F0390 Unspecified dementia without behavioral disturbance: Secondary | ICD-10-CM | POA: Diagnosis not present

## 2023-10-09 ENCOUNTER — Ambulatory Visit (INDEPENDENT_AMBULATORY_CARE_PROVIDER_SITE_OTHER): Admitting: Podiatry

## 2023-10-09 ENCOUNTER — Encounter: Payer: Self-pay | Admitting: Podiatry

## 2023-10-09 DIAGNOSIS — E1142 Type 2 diabetes mellitus with diabetic polyneuropathy: Secondary | ICD-10-CM | POA: Diagnosis not present

## 2023-10-09 DIAGNOSIS — M79675 Pain in left toe(s): Secondary | ICD-10-CM

## 2023-10-09 DIAGNOSIS — I739 Peripheral vascular disease, unspecified: Secondary | ICD-10-CM

## 2023-10-09 DIAGNOSIS — B351 Tinea unguium: Secondary | ICD-10-CM

## 2023-10-09 DIAGNOSIS — L84 Corns and callosities: Secondary | ICD-10-CM | POA: Diagnosis not present

## 2023-10-09 DIAGNOSIS — M79674 Pain in right toe(s): Secondary | ICD-10-CM

## 2023-10-09 NOTE — Progress Notes (Unsigned)
  Subjective:  Patient ID: Michelle Farrell, female    DOB: 1940/05/26,  MRN: [id number]  Chief Complaint  Patient presents with   [facility]    Last A1c: 7.4. no anticoag. Nail care and callus on left foot near first hallux. Patient is hard of hearing.     83 y.o. female presents with the above complaint. History confirmed with patient. Patient presenting with pain related to dystrophic thickened elongated nails. Patient is unable to trim own nails related to nail dystrophy and/or mobility issues. Patient does have a history of T2DM.  Last A1c 7.4.  Patient does/does not have callus present located at the left first toe and right subfirst metatarsal head causing pain.  She does have history of CVA and history of PAD.  Currently using a wheelchair.  She comes from a facility.  Objective:  Physical Exam: warm, capillary refill 3 to 5 seconds to the digits, pedal hair growth decreased, pedal skin atrophic nail exam onychomycosis of the toenails, onycholysis, and dystrophic nails DP pulses faintly palpable, PT pulses faintly palpable right side, nonpalpable left side, and protective sensation absent Left Foot:  Pain with palpation of nails due to elongation and dystrophic growth.  Preulcerative callus present plantar medial aspect of head of first toe proximal phalanx Right Foot: Pain with palpation of nails due to elongation and dystrophic growth.  Preulcerative callus present right subfirst metatarsal head  Assessment:   1. Diabetic polyneuropathy associated with type 2 diabetes mellitus (HCC)   2. Pre-ulcerative calluses   3. PAD (peripheral artery disease) (HCC)   4. Pain due to onychomycosis of toenails of both feet      Plan:  Patient was evaluated and treated and all questions answered.  #Pre ulcerative calluses present left first toe plantar medial aspect head of proximal phalanx, right subfirst metatarsal head All symptomatic hyperkeratoses x 2 separate lesions were safely debrided with a  sterile #312 blade to patient's level of comfort without incident. We discussed preventative and palliative care of these lesions including supportive and accommodative shoegear, padding, prefabricated and custom molded accommodative orthoses, use of a pumice stone and lotions/creams daily. - Close monitoring of the left first toe callus as this is nearly forming an ulcer  #Onychomycosis with pain  -Nails palliatively debrided as below. -Educated on self-care  Procedure: Nail Debridement Rationale: Pain Type of Debridement: manual, sharp debridement. Instrumentation: Nail nipper, rotary burr. Number of Nails: 10  # PAD - ABI results reviewed showing noncompressible vessels left lower extremity - Vascular referral if patient performs ulceration  # Diabetes with neuropathy Patient educated on diabetes. Discussed proper diabetic foot care and discussed risks and complications of disease. Educated patient in depth on reasons to return to the office immediately should he/she discover anything concerning or new on the feet. All questions answered. Discussed proper shoes as well.  - Referral to [facility] for diabetic shoes with 3 sets of multidensity inserts due to diabetes, PAD, pedal deformity contributing to preulcerative calluses.   Return in about 3 months (around 01/09/2024) for Diabetic Foot Care.         Ethan Saddler, DPM [facility] / [facility]

## 2023-10-11 DIAGNOSIS — N183 Chronic kidney disease, stage 3 unspecified: Secondary | ICD-10-CM | POA: Diagnosis not present

## 2023-10-11 DIAGNOSIS — F0390 Unspecified dementia without behavioral disturbance: Secondary | ICD-10-CM | POA: Diagnosis not present

## 2023-10-11 DIAGNOSIS — Z87440 Personal history of urinary (tract) infections: Secondary | ICD-10-CM | POA: Diagnosis not present

## 2023-10-20 DIAGNOSIS — Z950 Presence of cardiac pacemaker: Secondary | ICD-10-CM | POA: Diagnosis not present

## 2023-10-23 DIAGNOSIS — B351 Tinea unguium: Secondary | ICD-10-CM | POA: Diagnosis not present

## 2023-10-23 DIAGNOSIS — E1159 Type 2 diabetes mellitus with other circulatory complications: Secondary | ICD-10-CM | POA: Diagnosis not present

## 2023-11-15 DIAGNOSIS — R569 Unspecified convulsions: Secondary | ICD-10-CM | POA: Diagnosis not present

## 2023-11-15 DIAGNOSIS — Z9181 History of falling: Secondary | ICD-10-CM | POA: Diagnosis not present

## 2023-11-15 DIAGNOSIS — M6281 Muscle weakness (generalized): Secondary | ICD-10-CM | POA: Diagnosis not present

## 2023-11-15 DIAGNOSIS — F0390 Unspecified dementia without behavioral disturbance: Secondary | ICD-10-CM | POA: Diagnosis not present

## 2023-11-17 DIAGNOSIS — R2689 Other abnormalities of gait and mobility: Secondary | ICD-10-CM | POA: Diagnosis not present

## 2023-11-17 DIAGNOSIS — Z9181 History of falling: Secondary | ICD-10-CM | POA: Diagnosis not present

## 2023-11-17 DIAGNOSIS — F0390 Unspecified dementia without behavioral disturbance: Secondary | ICD-10-CM | POA: Diagnosis not present

## 2023-11-17 DIAGNOSIS — M6281 Muscle weakness (generalized): Secondary | ICD-10-CM | POA: Diagnosis not present

## 2023-12-14 DIAGNOSIS — R2689 Other abnormalities of gait and mobility: Secondary | ICD-10-CM | POA: Diagnosis not present

## 2023-12-14 DIAGNOSIS — Z950 Presence of cardiac pacemaker: Secondary | ICD-10-CM | POA: Diagnosis not present

## 2023-12-14 DIAGNOSIS — F0390 Unspecified dementia without behavioral disturbance: Secondary | ICD-10-CM | POA: Diagnosis not present

## 2023-12-14 DIAGNOSIS — Z9181 History of falling: Secondary | ICD-10-CM | POA: Diagnosis not present

## 2023-12-14 DIAGNOSIS — M6281 Muscle weakness (generalized): Secondary | ICD-10-CM | POA: Diagnosis not present

## 2023-12-15 DIAGNOSIS — Z23 Encounter for immunization: Secondary | ICD-10-CM | POA: Diagnosis not present

## 2024-01-04 DIAGNOSIS — E119 Type 2 diabetes mellitus without complications: Secondary | ICD-10-CM | POA: Diagnosis not present

## 2024-01-04 DIAGNOSIS — D649 Anemia, unspecified: Secondary | ICD-10-CM | POA: Diagnosis not present

## 2024-01-04 DIAGNOSIS — I10 Essential (primary) hypertension: Secondary | ICD-10-CM | POA: Diagnosis not present

## 2024-01-04 DIAGNOSIS — E785 Hyperlipidemia, unspecified: Secondary | ICD-10-CM | POA: Diagnosis not present

## 2024-01-06 DIAGNOSIS — N390 Urinary tract infection, site not specified: Secondary | ICD-10-CM | POA: Diagnosis not present

## 2024-01-08 ENCOUNTER — Ambulatory Visit (INDEPENDENT_AMBULATORY_CARE_PROVIDER_SITE_OTHER): Admitting: Podiatry

## 2024-01-08 ENCOUNTER — Encounter: Payer: Self-pay | Admitting: Podiatry

## 2024-01-08 DIAGNOSIS — M79674 Pain in right toe(s): Secondary | ICD-10-CM | POA: Diagnosis not present

## 2024-01-08 DIAGNOSIS — B351 Tinea unguium: Secondary | ICD-10-CM | POA: Diagnosis not present

## 2024-01-08 DIAGNOSIS — N390 Urinary tract infection, site not specified: Secondary | ICD-10-CM | POA: Diagnosis not present

## 2024-01-08 DIAGNOSIS — E1142 Type 2 diabetes mellitus with diabetic polyneuropathy: Secondary | ICD-10-CM | POA: Diagnosis not present

## 2024-01-08 DIAGNOSIS — M79675 Pain in left toe(s): Secondary | ICD-10-CM | POA: Diagnosis not present

## 2024-01-08 DIAGNOSIS — L84 Corns and callosities: Secondary | ICD-10-CM

## 2024-01-08 NOTE — Progress Notes (Unsigned)
  Subjective:  Patient ID: Michelle Farrell, female    DOB: Apr 30, 1940,  MRN: [id number]  Chief Complaint  Patient presents with   [facility]    [facility] with callous A1c unknown No anti coag.     83 y.o. female presents with the above complaint. History confirmed with patient. Patient presenting with pain related to dystrophic thickened elongated nails. Patient is unable to trim own nails related to nail dystrophy and/or mobility issues. Patient does have a history of T2DM.  She is unsure of her last A1c.  Patient does have callus present located at the left first toe and right subfirst metatarsal head causing pain.  She does have history of CVA and history of PAD.  Currently using a wheelchair.  She comes from a facility.  She is complaining of right sided pain today from her arm, down her side to her right foot  Objective:  Physical Exam: warm, capillary refill 3 to 5 seconds to the digits, pedal hair growth decreased, pedal skin atrophic nail exam onychomycosis of the toenails, onycholysis, and dystrophic nails with nail plate thickening greater than 3 mm x 10 DP pulses faintly palpable, PT pulses faintly palpable right side, nonpalpable left side, and protective sensation absent Left Foot:  Pain with palpation of nails due to elongation and dystrophic growth.  Preulcerative callus present plantar medial aspect of head of first toe proximal phalanx, very nearly involving dermal tissue Right Foot: Pain with palpation of nails due to elongation and dystrophic growth.  Preulcerative callus present right subfirst metatarsal head - Right lower extremity nonedematous, no pain with calf squeeze, negative Homans' sign no edema, no signs of acute trauma, no gross deformity.  Right sided weakness from stroke  Assessment:   1. Diabetic polyneuropathy associated with type 2 diabetes mellitus (HCC)   2. Pain due to onychomycosis of toenails of both feet   3. Pre-ulcerative calluses      Plan:  Patient was  evaluated and treated and all questions answered.  #Pre ulcerative calluses present left first toe plantar medial aspect head of proximal phalanx, right subfirst metatarsal head All symptomatic hyperkeratoses x 2 separate lesions were safely debrided with a sterile #312 blade to patient's level of comfort without incident. We discussed preventative and palliative care of these lesions including supportive and accommodative shoegear, padding, prefabricated and custom molded accommodative orthoses, use of a pumice stone and lotions/creams daily. - Close monitoring of the left first toe callus as this is nearly forming an ulcer - Near involvement of dermal tissue today  #Onychomycosis with pain  -Nails palliatively debrided as below. -Educated on self-care  Procedure: Nail Debridement Rationale: Pain Type of Debridement: manual, sharp debridement. Instrumentation: Nail nipper, rotary burr. Number of Nails: 10   # PAD - ABI results reviewed showing noncompressible vessels left lower extremity - Vascular referral if patient performs ulceration  # Diabetes with neuropathy Patient educated on diabetes. Discussed proper diabetic foot care and discussed risks and complications of disease. Educated patient in depth on reasons to return to the office immediately should he/she discover anything concerning or new on the feet. All questions answered. Discussed proper shoes as well.     Return in about 9 weeks (around 03/11/2024) for Diabetic Foot Care.         Ethan Saddler, DPM [facility] / [facility]

## 2024-02-26 DIAGNOSIS — Z9181 History of falling: Secondary | ICD-10-CM | POA: Diagnosis not present

## 2024-02-26 DIAGNOSIS — F0390 Unspecified dementia without behavioral disturbance: Secondary | ICD-10-CM | POA: Diagnosis not present

## 2024-03-11 ENCOUNTER — Encounter: Payer: Self-pay | Admitting: Podiatry

## 2024-03-11 ENCOUNTER — Ambulatory Visit: Admitting: Podiatry

## 2024-03-11 DIAGNOSIS — L97522 Non-pressure chronic ulcer of other part of left foot with fat layer exposed: Secondary | ICD-10-CM

## 2024-03-11 DIAGNOSIS — M79674 Pain in right toe(s): Secondary | ICD-10-CM

## 2024-03-11 DIAGNOSIS — B351 Tinea unguium: Secondary | ICD-10-CM | POA: Diagnosis not present

## 2024-03-11 DIAGNOSIS — L84 Corns and callosities: Secondary | ICD-10-CM

## 2024-03-11 DIAGNOSIS — E1142 Type 2 diabetes mellitus with diabetic polyneuropathy: Secondary | ICD-10-CM

## 2024-03-11 DIAGNOSIS — M79675 Pain in left toe(s): Secondary | ICD-10-CM | POA: Diagnosis not present

## 2024-03-11 DIAGNOSIS — I739 Peripheral vascular disease, unspecified: Secondary | ICD-10-CM

## 2024-03-11 MED ORDER — MUPIROCIN 2 % EX OINT: 1.0000 | TOPICAL_OINTMENT | Freq: Two times a day (BID) | CUTANEOUS | 2 refills | Status: DC

## 2024-03-11 NOTE — Progress Notes (Unsigned)
 No chief complaint on file.   HPI: 83 y.o. female presenting today primarily for diabetic footcare.  Comes in with painful, thickened, elongated dystrophic toenails.  She is unable to maintain her toenails due to mobility issues and due to the dystrophic nature of the toenails.  She does have history of type 2 diabetes, history of CVA and PAD as well.  She does come from a facility.  Presenting with painful calluses right subfirst metatarsal head, left first toe as well as new lesion of concern distal left first toe.  Past Medical History:  Diagnosis Date   AKI (acute kidney injury)    Anemia    Arthritis    Back pain, chronic    CHF (congestive heart failure) (HCC)    CKD (chronic kidney disease)    COPD (chronic obstructive pulmonary disease) (HCC)    Crohn disease (HCC)    Diabetes mellitus without complication (HCC)    Esophageal stricture    s/p dilation 2003, 2008   GERD (gastroesophageal reflux disease)    Hip pain, chronic    Hypertension    PUD (peptic ulcer disease)    Seizures (HCC)    Sleep apnea    Stroke [facility])    Past Surgical History:  Procedure Laterality Date   ABDOMINAL HYSTERECTOMY     BIOPSY  12/14/2021   Procedure: BIOPSY;  Surgeon: Wilhelmenia Aloha Raddle., MD;  Location: [facility];  Service: Gastroenterology;;   BLADDER SURGERY     CHOLECYSTECTOMY     ESOPHAGOGASTRODUODENOSCOPY N/A 07/08/2014   Procedure: ESOPHAGOGASTRODUODENOSCOPY (EGD);  Surgeon: Gwendlyn ONEIDA Buddy, MD;  Location: [facility];  Service: Endoscopy;  Laterality: N/A;   ESOPHAGOGASTRODUODENOSCOPY (EGD) WITH PROPOFOL  N/A 12/14/2021   Procedure: ESOPHAGOGASTRODUODENOSCOPY (EGD) WITH PROPOFOL ;  Surgeon: Wilhelmenia Aloha Raddle., MD;  Location: [facility];  Service: Gastroenterology;  Laterality: N/A;   SKIN CANCER EXCISION     TONSILLECTOMY     Allergies[1]    PHYSICAL EXAM: There were no vitals filed for this visit.  General: The patient is alert and oriented x3 in no acute  distress.  Dermatology: Pedal skin is atrophic to lower extremities.  Hyperkeratotic lesion present subfirst metatarsal head right foot and plantar medial first toe interphalangeal joint left foot.  Nailplates x 10 are thickened, elongated, dystrophic with yellow discoloration and subungual debris.  Appearance consistent with mycotic infection.  Tender on direct dorsal palpation.  Interspaces are clear of maceration and debris.  Ulceration distal tuft of left first toe noted below appearance consistent of ischemic ulcer.    Wound 1:  Location: Distal left first toe        Depth: Subcutaneous tissue        Wound Border: Regular, punched-out        Wound Base: Granular        Drainage: Scant serous       Odor?:  No        Surrounding Tissue: Normal        Infected?:  No        Necrosis?:  Loss of skin and subcutaneous tissue        Pain?:  Tender on palpation        Tunneling: No       Dimensions (cm): 0.3 x 0.4 x 0.2 cm  Vascular: DP pulses faintly palpable, PT pulses nonpalpable left side, faintly palpable right side.  Cap refill delayed, 3 to 5 seconds to digits.  Absent pedal hair growth  Neurological: Light touch sensation decreased to  toes.  Musculoskeletal Exam: Fat pad atrophy.  Hallux limitus.  Decreased muscle strength due to stroke.      No data to display           RADIOGRAPHIC EXAM:  Normal osseous mineralization.   ASSESSMENT / PLAN OF CARE: 1. 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 Pain due to onychomycosis of toenails of both feet   3. Pre-ulcerative calluses   4. Diabetic polyneuropathy associated with type 2 diabetes mellitus (HCC)   5. PAD (peripheral artery disease)      Meds ordered this encounter  Medications   mupirocin  ointment (BACTROBAN ) 2 %    Sig: Apply 1 Application topically 2 (two) times daily.    Dispense:  30 g    Refill:  2   AMB REFERRAL TO VASCULAR SURGERY/CLINIC  # Ischemic ulcer left first toe with  PAD - Referral sent to vascular surgery - Prior ABIs notable for noncompressible left lower extremity vessels - Mupirocin  ointment sent to patient's pharmacy.  Apply to affected area 1-2 times daily.  Provided written orders for dressing changes at facility - Monitor for signs of infection or progressing tissue loss - No significant excisional debridement was performed today due to decreased vascular status.  #Onychomycosis with pain  -Nails palliatively debrided as below. -Educated on self-care - At risk foot care due to PAD  Procedure: Nail Debridement Rationale: Pain Type of Debridement: manual, sharp debridement. Instrumentation: Nail nipper, rotary burr. Number of Nails: 10  # Preulcerative callus right subfirst metatarsal head, left first toe plantar medial interphalangeal joint All symptomatic hyperkeratoses x2  were safely debrided with a sterile #15 blade to patient's level of comfort without incident. We discussed preventative and palliative care of these lesions including supportive and accommodative shoegear, padding, prefabricated and custom molded accommodative orthoses, use of a pumice stone and lotions/creams daily.  Patient educated on diabetes. Discussed proper diabetic foot care and discussed risks and complications of disease. Educated patient in depth on reasons to return to the office immediately should he/she discover anything concerning or new on the feet. All questions answered. Discussed proper shoes as well.      Return in about 2 weeks (around 03/25/2024) for Ulcer Check.   Ethan LITTIE Saddler, DPM, AACFAS [facility]     [address] [place], [place] [address]                Office [phone]  Fax [phone]       [1]  Allergies Allergen Reactions   Tape Other (See Comments)    NO PLASTIC TAPE- Bubbles the skin!!   Tramadol Other (See Comments)    Seizures    Latex Rash

## 2024-03-26 ENCOUNTER — Ambulatory Visit: Admitting: Podiatry

## 2024-04-16 ENCOUNTER — Ambulatory Visit (INDEPENDENT_AMBULATORY_CARE_PROVIDER_SITE_OTHER): Admitting: Podiatry

## 2024-04-16 ENCOUNTER — Encounter: Payer: Self-pay | Admitting: Podiatry

## 2024-04-16 DIAGNOSIS — M79674 Pain in right toe(s): Secondary | ICD-10-CM | POA: Diagnosis not present

## 2024-04-16 DIAGNOSIS — I739 Peripheral vascular disease, unspecified: Secondary | ICD-10-CM | POA: Diagnosis not present

## 2024-04-16 DIAGNOSIS — B351 Tinea unguium: Secondary | ICD-10-CM | POA: Diagnosis not present

## 2024-04-16 DIAGNOSIS — L97522 Non-pressure chronic ulcer of other part of left foot with fat layer exposed: Secondary | ICD-10-CM | POA: Diagnosis not present

## 2024-04-16 DIAGNOSIS — M79675 Pain in left toe(s): Secondary | ICD-10-CM

## 2024-04-16 DIAGNOSIS — E1142 Type 2 diabetes mellitus with diabetic polyneuropathy: Secondary | ICD-10-CM | POA: Diagnosis not present

## 2024-04-16 MED ORDER — MUPIROCIN 2 % EX OINT: 1.0000 | TOPICAL_OINTMENT | Freq: Two times a day (BID) | CUTANEOUS | 2 refills | Status: AC

## 2024-04-16 NOTE — Progress Notes (Signed)
 "      Chief Complaint  Patient presents with   Wound Check    Left hallux, closed and callused. Please also check between toes at 4 and 5. I cleaned some debris out, does not appear to be a wound there.     HPI: 84 y.o. female presents today for recheck of left first toe ulcer in the setting of type 2 diabetes with neuropathy, PAD, history of stroke, CHF, COPD, CKD.  Last visit referral was placed to vascular surgery due to developing ulcer as well as previously noted noncompressible left lower extremity artery vessels.  She comes from a facility.  She continues to endorse pain to the left first toe.  Ulcerations do appear improved though do have overlying hyperkeratotic callus.  There is some concern of the left first toe nail plate which is dystrophic and tender.  Past Medical History:  Diagnosis Date   AKI (acute kidney injury)    Anemia    Arthritis    Back pain, chronic    CHF (congestive heart failure) (HCC)    CKD (chronic kidney disease)    COPD (chronic obstructive pulmonary disease) (HCC)    Crohn disease (HCC)    Diabetes mellitus without complication (HCC)    Esophageal stricture    s/p dilation 2003, 2008   GERD (gastroesophageal reflux disease)    Hip pain, chronic    Hypertension    PUD (peptic ulcer disease)    Seizures (HCC)    Sleep apnea    Stroke [facility])     Past Surgical History:  Procedure Laterality Date   ABDOMINAL HYSTERECTOMY     BIOPSY  12/14/2021   Procedure: BIOPSY;  Surgeon: Wilhelmenia Aloha Raddle., MD;  Location: [facility];  Service: Gastroenterology;;   BLADDER SURGERY     CHOLECYSTECTOMY     ESOPHAGOGASTRODUODENOSCOPY N/A 07/08/2014   Procedure: ESOPHAGOGASTRODUODENOSCOPY (EGD);  Surgeon: Gwendlyn ONEIDA Buddy, MD;  Location: [facility];  Service: Endoscopy;  Laterality: N/A;   ESOPHAGOGASTRODUODENOSCOPY (EGD) WITH PROPOFOL  N/A 12/14/2021   Procedure: ESOPHAGOGASTRODUODENOSCOPY (EGD) WITH PROPOFOL ;  Surgeon: Wilhelmenia Aloha Raddle., MD;   Location: [facility];  Service: Gastroenterology;  Laterality: N/A;   SKIN CANCER EXCISION     TONSILLECTOMY      Allergies[1]  ROS    Physical Exam: There were no vitals filed for this visit.  General: The patient is alert and oriented x3 in no acute distress.  Dermatology: Previously area of concern left first toe ulceration appears callused over without underlying wound.  Underlying skin is atrophic here.  This is involving distal tuft of first toe and plantar medial interphalangeal joint.  Left first toe plate is especially dystrophic with nail thickening, tender on palpation.  Area some drainage noted with debridement of the overlying nail today.  Vascular: DP pulses faintly palpable, PT pulses nonpalpable left side, faintly palpable right side. Cap refill delayed, 3 to 5 seconds to digits. Absent pedal hair growth   Neurological: Light touch sensation decreased to toes  Musculoskeletal Exam: Fat pad atrophy. Hallux limitus. Decreased muscle strength due to stroke.     Assessment/Plan of Care: 1. 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 PAD (peripheral artery disease)   3. Diabetic polyneuropathy associated with type 2 diabetes mellitus (HCC)      Meds ordered this encounter  Medications   mupirocin  ointment (BACTROBAN ) 2 %    Sig: Apply 1 Application topically 2 (two) times daily.  Dispense:  30 g    Refill:  2   None  Discussed clinical findings with patient today.  Pared down callus at site of previously noted ulceration distal left first toe.  Appears healed at this point fortunately.  Given risk factors and pain still present left first toe did debride left first toenail as a courtesy today using sterile nail nippers.  Noted to have some scant drainage and erythema here.  Mupirocin  sent to patient's pharmacy with instructions to be applied to affected area 1-2 times a day.  Dispensed gel toe cap to provide cushion for shoe gear  and limit friction on toe.  Previously had referral placed for vascular surgery.  Do think patient would benefit from evaluation.  Recheck in 4 to 5 weeks for scheduled diabetic footcare. Reappoint as needed if any signs or symptoms of infection develop or if there is concern for any delayed healing or recurrence of wounds.   Dayani Winbush L. Lamount MAUL, AACFAS [facility]     [address] [place], [place] [address]                Office [phone]  Fax [phone]    U    [1]  Allergies Allergen Reactions   Tape Other (See Comments)    NO PLASTIC TAPE- Bubbles the skin!!   Tramadol Other (See Comments)    Seizures    Latex Rash   "

## 2024-05-13 ENCOUNTER — Ambulatory Visit: Admitting: Podiatry
